# Patient Record
Sex: Female | Born: 1955 | Race: Black or African American | Hispanic: No | Marital: Single | Smoking: Current some day smoker
Health system: Southern US, Community
[De-identification: ages and names within clinical notes are randomized; demographics above are authoritative.]

## PROBLEM LIST (undated history)

## (undated) DIAGNOSIS — F1410 Cocaine abuse, uncomplicated: Secondary | ICD-10-CM

## (undated) DIAGNOSIS — F419 Anxiety disorder, unspecified: Secondary | ICD-10-CM

## (undated) DIAGNOSIS — K222 Esophageal obstruction: Secondary | ICD-10-CM

## (undated) DIAGNOSIS — C7931 Secondary malignant neoplasm of brain: Secondary | ICD-10-CM

## (undated) DIAGNOSIS — F329 Major depressive disorder, single episode, unspecified: Secondary | ICD-10-CM

## (undated) DIAGNOSIS — C3490 Malignant neoplasm of unspecified part of unspecified bronchus or lung: Secondary | ICD-10-CM

## (undated) DIAGNOSIS — E782 Mixed hyperlipidemia: Secondary | ICD-10-CM

## (undated) DIAGNOSIS — Z87440 Personal history of urinary (tract) infections: Secondary | ICD-10-CM

## (undated) DIAGNOSIS — E119 Type 2 diabetes mellitus without complications: Secondary | ICD-10-CM

## (undated) DIAGNOSIS — J910 Malignant pleural effusion: Secondary | ICD-10-CM

## (undated) DIAGNOSIS — N320 Bladder-neck obstruction: Secondary | ICD-10-CM

## (undated) DIAGNOSIS — K219 Gastro-esophageal reflux disease without esophagitis: Secondary | ICD-10-CM

## (undated) DIAGNOSIS — J189 Pneumonia, unspecified organism: Secondary | ICD-10-CM

## (undated) DIAGNOSIS — Z86718 Personal history of other venous thrombosis and embolism: Secondary | ICD-10-CM

## (undated) DIAGNOSIS — R7303 Prediabetes: Secondary | ICD-10-CM

## (undated) DIAGNOSIS — F32A Depression, unspecified: Secondary | ICD-10-CM

## (undated) DIAGNOSIS — E46 Unspecified protein-calorie malnutrition: Secondary | ICD-10-CM

## (undated) DIAGNOSIS — I480 Paroxysmal atrial fibrillation: Secondary | ICD-10-CM

## (undated) DIAGNOSIS — Z923 Personal history of irradiation: Secondary | ICD-10-CM

## (undated) HISTORY — DX: Depression, unspecified: F32.A

## (undated) HISTORY — PX: HEMORRHOID SURGERY: SHX153

## (undated) HISTORY — DX: Major depressive disorder, single episode, unspecified: F32.9

## (undated) HISTORY — DX: Personal history of urinary (tract) infections: Z87.440

## (undated) HISTORY — DX: Anxiety disorder, unspecified: F41.9

## (undated) HISTORY — DX: Cocaine abuse, uncomplicated: F14.10

## (undated) HISTORY — PX: ABDOMINAL HYSTERECTOMY: SHX81

---

## 1998-02-11 ENCOUNTER — Ambulatory Visit (HOSPITAL_COMMUNITY): Admission: RE | Admit: 1998-02-11 | Discharge: 1998-02-11 | Payer: Self-pay | Admitting: Obstetrics

## 1998-08-12 ENCOUNTER — Other Ambulatory Visit: Admission: RE | Admit: 1998-08-12 | Discharge: 1998-08-12 | Payer: Self-pay | Admitting: Obstetrics

## 1999-09-21 ENCOUNTER — Other Ambulatory Visit: Admission: RE | Admit: 1999-09-21 | Discharge: 1999-09-21 | Payer: Self-pay | Admitting: Obstetrics

## 1999-10-27 ENCOUNTER — Encounter (HOSPITAL_BASED_OUTPATIENT_CLINIC_OR_DEPARTMENT_OTHER): Payer: Self-pay | Admitting: General Surgery

## 1999-10-29 ENCOUNTER — Ambulatory Visit (HOSPITAL_COMMUNITY): Admission: RE | Admit: 1999-10-29 | Discharge: 1999-10-29 | Payer: Self-pay | Admitting: General Surgery

## 1999-10-29 ENCOUNTER — Encounter (INDEPENDENT_AMBULATORY_CARE_PROVIDER_SITE_OTHER): Payer: Self-pay | Admitting: Specialist

## 2000-10-20 ENCOUNTER — Other Ambulatory Visit: Admission: RE | Admit: 2000-10-20 | Discharge: 2000-10-20 | Payer: Self-pay | Admitting: Obstetrics

## 2002-03-08 ENCOUNTER — Ambulatory Visit (HOSPITAL_COMMUNITY): Admission: RE | Admit: 2002-03-08 | Discharge: 2002-03-08 | Payer: Self-pay | Admitting: Internal Medicine

## 2003-03-13 ENCOUNTER — Ambulatory Visit (HOSPITAL_COMMUNITY): Admission: RE | Admit: 2003-03-13 | Discharge: 2003-03-13 | Payer: Self-pay | Admitting: Family Medicine

## 2004-03-04 ENCOUNTER — Ambulatory Visit (HOSPITAL_COMMUNITY): Admission: RE | Admit: 2004-03-04 | Discharge: 2004-03-04 | Payer: Self-pay | Admitting: Internal Medicine

## 2004-08-10 ENCOUNTER — Ambulatory Visit: Payer: Self-pay | Admitting: Nurse Practitioner

## 2004-08-26 ENCOUNTER — Ambulatory Visit: Payer: Self-pay | Admitting: *Deleted

## 2004-10-12 ENCOUNTER — Ambulatory Visit: Payer: Self-pay | Admitting: Nurse Practitioner

## 2005-03-04 ENCOUNTER — Ambulatory Visit: Payer: Self-pay | Admitting: Nurse Practitioner

## 2005-03-22 ENCOUNTER — Ambulatory Visit (HOSPITAL_COMMUNITY): Admission: RE | Admit: 2005-03-22 | Discharge: 2005-03-22 | Payer: Self-pay | Admitting: Family Medicine

## 2005-03-31 ENCOUNTER — Encounter: Admission: RE | Admit: 2005-03-31 | Discharge: 2005-03-31 | Payer: Self-pay | Admitting: Family Medicine

## 2005-09-30 ENCOUNTER — Encounter: Admission: RE | Admit: 2005-09-30 | Discharge: 2005-09-30 | Payer: Self-pay | Admitting: Internal Medicine

## 2005-12-14 ENCOUNTER — Ambulatory Visit: Payer: Self-pay | Admitting: Nurse Practitioner

## 2005-12-23 ENCOUNTER — Ambulatory Visit (HOSPITAL_COMMUNITY): Admission: RE | Admit: 2005-12-23 | Discharge: 2005-12-23 | Payer: Self-pay | Admitting: Nurse Practitioner

## 2005-12-28 ENCOUNTER — Ambulatory Visit: Payer: Self-pay | Admitting: Nurse Practitioner

## 2006-05-04 ENCOUNTER — Ambulatory Visit: Payer: Self-pay | Admitting: Nurse Practitioner

## 2006-05-12 ENCOUNTER — Encounter: Admission: RE | Admit: 2006-05-12 | Discharge: 2006-05-12 | Payer: Self-pay | Admitting: Nurse Practitioner

## 2006-06-13 ENCOUNTER — Ambulatory Visit: Payer: Self-pay | Admitting: Nurse Practitioner

## 2006-06-15 ENCOUNTER — Ambulatory Visit: Payer: Self-pay | Admitting: Nurse Practitioner

## 2006-07-22 ENCOUNTER — Ambulatory Visit: Payer: Self-pay | Admitting: Family Medicine

## 2007-03-02 ENCOUNTER — Ambulatory Visit: Payer: Self-pay | Admitting: Nurse Practitioner

## 2007-03-21 ENCOUNTER — Ambulatory Visit: Payer: Self-pay | Admitting: Internal Medicine

## 2007-04-04 ENCOUNTER — Ambulatory Visit: Payer: Self-pay | Admitting: Internal Medicine

## 2007-06-03 ENCOUNTER — Emergency Department (HOSPITAL_COMMUNITY): Admission: EM | Admit: 2007-06-03 | Discharge: 2007-06-03 | Payer: Self-pay | Admitting: Emergency Medicine

## 2007-06-19 ENCOUNTER — Ambulatory Visit: Payer: Self-pay | Admitting: Internal Medicine

## 2007-06-25 ENCOUNTER — Emergency Department (HOSPITAL_COMMUNITY): Admission: EM | Admit: 2007-06-25 | Discharge: 2007-06-25 | Payer: Self-pay | Admitting: Family Medicine

## 2007-07-26 ENCOUNTER — Encounter: Admission: RE | Admit: 2007-07-26 | Discharge: 2007-07-26 | Payer: Self-pay | Admitting: Family Medicine

## 2007-08-18 ENCOUNTER — Ambulatory Visit: Payer: Self-pay | Admitting: Internal Medicine

## 2007-11-09 ENCOUNTER — Ambulatory Visit: Payer: Self-pay | Admitting: Family Medicine

## 2007-11-09 ENCOUNTER — Encounter (INDEPENDENT_AMBULATORY_CARE_PROVIDER_SITE_OTHER): Payer: Self-pay | Admitting: Nurse Practitioner

## 2007-11-11 ENCOUNTER — Emergency Department (HOSPITAL_COMMUNITY): Admission: EM | Admit: 2007-11-11 | Discharge: 2007-11-11 | Payer: Self-pay | Admitting: Emergency Medicine

## 2008-03-05 ENCOUNTER — Ambulatory Visit: Payer: Self-pay | Admitting: Internal Medicine

## 2008-04-03 ENCOUNTER — Encounter: Admission: RE | Admit: 2008-04-03 | Discharge: 2008-04-03 | Payer: Self-pay | Admitting: Orthopedic Surgery

## 2008-05-05 ENCOUNTER — Emergency Department (HOSPITAL_COMMUNITY): Admission: EM | Admit: 2008-05-05 | Discharge: 2008-05-05 | Payer: Self-pay | Admitting: Family Medicine

## 2008-06-19 ENCOUNTER — Encounter: Payer: Self-pay | Admitting: Internal Medicine

## 2008-06-19 ENCOUNTER — Encounter (INDEPENDENT_AMBULATORY_CARE_PROVIDER_SITE_OTHER): Payer: Self-pay | Admitting: Internal Medicine

## 2008-06-19 ENCOUNTER — Ambulatory Visit: Payer: Self-pay | Admitting: Family Medicine

## 2008-06-19 LAB — CONVERTED CEMR LAB
BUN: 13 mg/dL (ref 6–23)
Basophils Absolute: 0 10*3/uL (ref 0.0–0.1)
Basophils Relative: 1 % (ref 0–1)
CO2: 23 meq/L (ref 19–32)
Calcium: 9.3 mg/dL (ref 8.4–10.5)
Chloride: 110 meq/L (ref 96–112)
Cholesterol: 161 mg/dL (ref 0–200)
Creatinine, Ser: 0.74 mg/dL (ref 0.40–1.20)
Eosinophils Absolute: 0.2 10*3/uL (ref 0.0–0.7)
Eosinophils Relative: 2 % (ref 0–5)
Glucose, Bld: 68 mg/dL — ABNORMAL LOW (ref 70–99)
HCT: 44.2 % (ref 36.0–46.0)
HDL: 64 mg/dL (ref >39)
Hemoglobin: 14.2 g/dL (ref 12.0–15.0)
LDL Cholesterol: 78 mg/dL (ref 0–99)
Lymphocytes Relative: 41 % (ref 12–46)
Lymphs Abs: 3.4 10*3/uL (ref 0.7–4.0)
MCHC: 32.1 g/dL (ref 30.0–36.0)
MCV: 94.6 fL (ref 78.0–100.0)
Monocytes Absolute: 0.5 10*3/uL (ref 0.1–1.0)
Monocytes Relative: 6 % (ref 3–12)
Neutro Abs: 4.1 10*3/uL (ref 1.7–7.7)
Neutrophils Relative %: 50 % (ref 43–77)
Platelets: 235 10*3/uL (ref 150–400)
Potassium: 4.2 meq/L (ref 3.5–5.3)
RBC: 4.67 M/uL (ref 3.87–5.11)
RDW: 14.7 % (ref 11.5–15.5)
Sodium: 144 meq/L (ref 135–145)
TSH: 0.464 microintl units/mL (ref 0.350–4.50)
Total CHOL/HDL Ratio: 2.5
Triglycerides: 97 mg/dL (ref <150)
VLDL: 19 mg/dL (ref 0–40)
WBC: 8.2 10*3/uL (ref 4.0–10.5)

## 2008-06-21 ENCOUNTER — Ambulatory Visit: Payer: Self-pay | Admitting: Internal Medicine

## 2008-07-26 ENCOUNTER — Encounter: Admission: RE | Admit: 2008-07-26 | Discharge: 2008-07-26 | Payer: Self-pay | Admitting: Internal Medicine

## 2008-08-12 ENCOUNTER — Ambulatory Visit: Payer: Self-pay | Admitting: Internal Medicine

## 2008-10-11 ENCOUNTER — Emergency Department (HOSPITAL_COMMUNITY): Admission: EM | Admit: 2008-10-11 | Discharge: 2008-10-11 | Payer: Self-pay | Admitting: Family Medicine

## 2008-11-15 ENCOUNTER — Emergency Department (HOSPITAL_COMMUNITY): Admission: EM | Admit: 2008-11-15 | Discharge: 2008-11-15 | Payer: Self-pay | Admitting: Family Medicine

## 2008-12-05 ENCOUNTER — Ambulatory Visit: Payer: Self-pay | Admitting: Internal Medicine

## 2008-12-25 ENCOUNTER — Ambulatory Visit: Payer: Self-pay | Admitting: Family Medicine

## 2009-01-14 ENCOUNTER — Emergency Department (HOSPITAL_COMMUNITY): Admission: EM | Admit: 2009-01-14 | Discharge: 2009-01-14 | Payer: Self-pay | Admitting: Family Medicine

## 2009-02-12 ENCOUNTER — Encounter (INDEPENDENT_AMBULATORY_CARE_PROVIDER_SITE_OTHER): Payer: Self-pay | Admitting: Internal Medicine

## 2009-02-12 ENCOUNTER — Ambulatory Visit: Payer: Self-pay | Admitting: Family Medicine

## 2009-02-12 LAB — CONVERTED CEMR LAB

## 2009-02-18 ENCOUNTER — Ambulatory Visit: Payer: Self-pay | Admitting: Internal Medicine

## 2009-04-03 ENCOUNTER — Ambulatory Visit: Payer: Self-pay | Admitting: Internal Medicine

## 2009-04-14 ENCOUNTER — Ambulatory Visit: Payer: Self-pay | Admitting: Internal Medicine

## 2009-04-24 ENCOUNTER — Ambulatory Visit: Payer: Self-pay | Admitting: Family Medicine

## 2009-05-29 ENCOUNTER — Ambulatory Visit: Payer: Self-pay | Admitting: Internal Medicine

## 2009-05-29 LAB — CONVERTED CEMR LAB
ALT: 11 units/L (ref 0–35)
AST: 19 units/L (ref 0–37)
Albumin: 4.1 g/dL (ref 3.5–5.2)
Alkaline Phosphatase: 104 units/L (ref 39–117)
BUN: 17 mg/dL (ref 6–23)
Basophils Absolute: 0 10*3/uL (ref 0.0–0.1)
Basophils Relative: 1 % (ref 0–1)
CO2: 23 meq/L (ref 19–32)
Calcium: 9.1 mg/dL (ref 8.4–10.5)
Chloride: 108 meq/L (ref 96–112)
Cholesterol: 171 mg/dL (ref 0–200)
Creatinine, Ser: 0.86 mg/dL (ref 0.40–1.20)
Eosinophils Absolute: 0.2 10*3/uL (ref 0.0–0.7)
Eosinophils Relative: 2 % (ref 0–5)
Glucose, Bld: 79 mg/dL (ref 70–99)
HCT: 42.4 % (ref 36.0–46.0)
HDL: 70 mg/dL (ref >39)
Hemoglobin: 14.7 g/dL (ref 12.0–15.0)
LDL Cholesterol: 92 mg/dL (ref 0–99)
Lymphocytes Relative: 39 % (ref 12–46)
Lymphs Abs: 3 10*3/uL (ref 0.7–4.0)
MCHC: 34.7 g/dL (ref 30.0–36.0)
MCV: 91.6 fL (ref 78.0–100.0)
Monocytes Absolute: 0.4 10*3/uL (ref 0.1–1.0)
Monocytes Relative: 6 % (ref 3–12)
Neutro Abs: 4 10*3/uL (ref 1.7–7.7)
Neutrophils Relative %: 52 % (ref 43–77)
Platelets: 214 10*3/uL (ref 150–400)
Potassium: 4.3 meq/L (ref 3.5–5.3)
RBC: 4.63 M/uL (ref 3.87–5.11)
RDW: 14.8 % (ref 11.5–15.5)
Sodium: 141 meq/L (ref 135–145)
Total Bilirubin: 0.5 mg/dL (ref 0.3–1.2)
Total CHOL/HDL Ratio: 2.4
Total Protein: 6.7 g/dL (ref 6.0–8.3)
Triglycerides: 45 mg/dL (ref <150)
VLDL: 9 mg/dL (ref 0–40)
WBC: 7.7 10*3/uL (ref 4.0–10.5)

## 2009-07-01 ENCOUNTER — Ambulatory Visit: Payer: Self-pay | Admitting: Internal Medicine

## 2009-09-10 ENCOUNTER — Emergency Department (HOSPITAL_COMMUNITY): Admission: EM | Admit: 2009-09-10 | Discharge: 2009-09-10 | Payer: Self-pay | Admitting: Family Medicine

## 2009-09-17 ENCOUNTER — Ambulatory Visit: Payer: Self-pay | Admitting: Internal Medicine

## 2009-10-27 ENCOUNTER — Ambulatory Visit: Payer: Self-pay | Admitting: Family Medicine

## 2010-02-16 ENCOUNTER — Encounter (INDEPENDENT_AMBULATORY_CARE_PROVIDER_SITE_OTHER): Payer: Self-pay | Admitting: Internal Medicine

## 2010-02-16 ENCOUNTER — Ambulatory Visit: Payer: Self-pay | Admitting: Family Medicine

## 2010-02-16 ENCOUNTER — Encounter: Admission: RE | Admit: 2010-02-16 | Discharge: 2010-02-16 | Payer: Self-pay | Admitting: Internal Medicine

## 2010-02-16 LAB — CONVERTED CEMR LAB: TSH: 0.885 microintl units/mL (ref 0.350–4.500)

## 2010-02-24 ENCOUNTER — Ambulatory Visit: Payer: Self-pay | Admitting: Internal Medicine

## 2010-05-26 ENCOUNTER — Ambulatory Visit: Payer: Self-pay | Admitting: Internal Medicine

## 2010-06-14 ENCOUNTER — Emergency Department (HOSPITAL_COMMUNITY): Admission: EM | Admit: 2010-06-14 | Discharge: 2010-06-14 | Payer: Self-pay | Admitting: Emergency Medicine

## 2010-07-08 ENCOUNTER — Emergency Department (HOSPITAL_COMMUNITY): Admission: EM | Admit: 2010-07-08 | Discharge: 2010-07-08 | Payer: Self-pay | Admitting: Family Medicine

## 2010-08-14 ENCOUNTER — Emergency Department (HOSPITAL_COMMUNITY)
Admission: EM | Admit: 2010-08-14 | Discharge: 2010-08-14 | Payer: Self-pay | Source: Home / Self Care | Admitting: Family Medicine

## 2010-10-18 ENCOUNTER — Emergency Department (HOSPITAL_COMMUNITY)
Admission: EM | Admit: 2010-10-18 | Discharge: 2010-10-18 | Payer: Self-pay | Source: Home / Self Care | Admitting: Family Medicine

## 2010-10-19 LAB — POCT URINALYSIS DIPSTICK
Bilirubin Urine: NEGATIVE
Ketones, ur: NEGATIVE mg/dL
Nitrite: POSITIVE — AB
Protein, ur: 100 mg/dL — AB
Specific Gravity, Urine: 1.025 (ref 1.005–1.030)
Urine Glucose, Fasting: NEGATIVE mg/dL
Urobilinogen, UA: 0.2 mg/dL (ref 0.0–1.0)
pH: 5.5 (ref 5.0–8.0)

## 2010-10-25 ENCOUNTER — Encounter: Payer: Self-pay | Admitting: Internal Medicine

## 2010-11-16 ENCOUNTER — Encounter (INDEPENDENT_AMBULATORY_CARE_PROVIDER_SITE_OTHER): Payer: Self-pay | Admitting: *Deleted

## 2010-11-16 LAB — CONVERTED CEMR LAB
Basophils Absolute: 0 10*3/uL (ref 0.0–0.1)
Basophils Relative: 1 % (ref 0–1)
Eosinophils Absolute: 0.2 10*3/uL (ref 0.0–0.7)
Eosinophils Relative: 3 % (ref 0–5)
HCT: 41.5 % (ref 36.0–46.0)
Hemoglobin: 13.7 g/dL (ref 12.0–15.0)
Lymphocytes Relative: 37 % (ref 12–46)
Lymphs Abs: 2.9 10*3/uL (ref 0.7–4.0)
MCHC: 33 g/dL (ref 30.0–36.0)
MCV: 92.2 fL (ref 78.0–100.0)
Monocytes Absolute: 0.5 10*3/uL (ref 0.1–1.0)
Monocytes Relative: 7 % (ref 3–12)
Neutro Abs: 4.1 10*3/uL (ref 1.7–7.7)
Neutrophils Relative %: 53 % (ref 43–77)
Platelets: 245 10*3/uL (ref 150–400)
RBC: 4.5 M/uL (ref 3.87–5.11)
RDW: 14.4 % (ref 11.5–15.5)
TSH: 0.673 microintl units/mL (ref 0.350–4.500)
WBC: 7.8 10*3/uL (ref 4.0–10.5)

## 2010-11-17 ENCOUNTER — Encounter (INDEPENDENT_AMBULATORY_CARE_PROVIDER_SITE_OTHER): Payer: Self-pay | Admitting: *Deleted

## 2010-12-17 LAB — POCT URINALYSIS DIPSTICK
Bilirubin Urine: NEGATIVE
Glucose, UA: NEGATIVE mg/dL
Ketones, ur: NEGATIVE mg/dL
Nitrite: POSITIVE — AB
Protein, ur: NEGATIVE mg/dL
Specific Gravity, Urine: 1.02 (ref 1.005–1.030)
Urobilinogen, UA: 0.2 mg/dL (ref 0.0–1.0)
pH: 6 (ref 5.0–8.0)

## 2010-12-17 LAB — URINE CULTURE
Colony Count: >=100000
Culture  Setup Time: 201110051501

## 2010-12-29 ENCOUNTER — Encounter (INDEPENDENT_AMBULATORY_CARE_PROVIDER_SITE_OTHER): Payer: Self-pay | Admitting: *Deleted

## 2011-01-05 LAB — POCT URINALYSIS DIP (DEVICE)
Bilirubin Urine: NEGATIVE
Glucose, UA: NEGATIVE mg/dL
Ketones, ur: NEGATIVE mg/dL
Nitrite: NEGATIVE
Protein, ur: 100 mg/dL — AB
Specific Gravity, Urine: 1.01 (ref 1.005–1.030)
Urobilinogen, UA: 0.2 mg/dL (ref 0.0–1.0)
pH: 5.5 (ref 5.0–8.0)

## 2011-01-18 LAB — POCT I-STAT, CHEM 8
BUN: 20 mg/dL (ref 6–23)
Calcium, Ion: 1.22 mmol/L (ref 1.12–1.32)
Chloride: 106 mEq/L (ref 96–112)
Creatinine, Ser: 1 mg/dL (ref 0.4–1.2)
Glucose, Bld: 79 mg/dL (ref 70–99)
HCT: 45 % (ref 36.0–46.0)
Hemoglobin: 15.3 g/dL — ABNORMAL HIGH (ref 12.0–15.0)
Potassium: 4.2 mEq/L (ref 3.5–5.1)
Sodium: 142 mEq/L (ref 135–145)
TCO2: 26 mmol/L (ref 0–100)

## 2011-02-19 NOTE — Op Note (Signed)
[facility]  Patient:    Kim Ross                          MRN: [id number] Proc. Date: 10/29/99 Adm. Date:  [id number] Disc. Date: [id number] Attending:  Sonda Primes                           Operative Report  PREOPERATIVE DIAGNOSIS:  Left-sided ______ breast mass.  POSTOPERATIVE DIAGNOSIS:  Left-sided ______ breast mass.  PATHOLOGY:  Pending.  PROCEDURE:  Excision and biopsy of left ______ mass with associated duct excision.  SURGEON:  Mardene Celeste. Lurene Shadow, M.D.  ASSISTANT:  Damita Lack, P.A.-student  ANESTHESIA:  General.  INDICATIONS:  This patient is a 55 year old woman with an enlarging left ______  mass which I think is probably a plugged duct.  She is not having any nipple discharge at this time.  She is brought to the operating room for excision of the mass.  PROCEDURE:  Following the induction of anesthesia, the patient is positioned supinely, and the left breast was prepped and draped to be included in a sterile operative field.  I made a transareolar incision extending from the areolar border up to the middle of the nipple.  This was made in an elliptical fashion, deep into the skin and subcutaneous tissues involving the entire mass and the surrounding  segment of intraductal tissue.  This mass was removed in its entirety and forwarded for pathologic evaluation.  Hemostasis was assured with electrocautery. Sponge, instrument, and sharp counts were verified.  Subcutaneous tissue were reapproximated with 3-0 Vicryl sutures, thus reconstructing the nipple, and the  skin was closed with a running 5-0 Monocryl suture.  The wound was reinforced with Steri-Strips, and sterile dressings were applied.  Anesthetic was reversed, and the patient was removed from the operating room to the recovery room in stable condition, having tolerated the procedure well. DD:  10/29/99 TD:  10/29/99 Job: [id number]

## 2011-04-09 ENCOUNTER — Other Ambulatory Visit (HOSPITAL_COMMUNITY): Payer: Self-pay | Admitting: Family Medicine

## 2011-04-09 DIAGNOSIS — Z1231 Encounter for screening mammogram for malignant neoplasm of breast: Secondary | ICD-10-CM

## 2011-04-28 ENCOUNTER — Ambulatory Visit (HOSPITAL_COMMUNITY)
Admission: RE | Admit: 2011-04-28 | Discharge: 2011-04-28 | Disposition: A | Discharge status: Home / Self Care | Payer: Self-pay | Source: Ambulatory Visit | Attending: Family Medicine | Admitting: Family Medicine

## 2011-04-28 DIAGNOSIS — Z1231 Encounter for screening mammogram for malignant neoplasm of breast: Secondary | ICD-10-CM | POA: Insufficient documentation

## 2012-05-02 ENCOUNTER — Encounter (HOSPITAL_COMMUNITY): Payer: Self-pay | Admitting: Neurology

## 2012-05-02 ENCOUNTER — Emergency Department (HOSPITAL_COMMUNITY)
Admission: EM | Admit: 2012-05-02 | Discharge: 2012-05-02 | Disposition: A | Discharge status: Home / Self Care | Payer: Self-pay | Attending: Emergency Medicine | Admitting: Emergency Medicine

## 2012-05-02 DIAGNOSIS — B353 Tinea pedis: Secondary | ICD-10-CM | POA: Insufficient documentation

## 2012-05-02 MED ORDER — CLOTRIMAZOLE 1 % EX CREA: TOPICAL_CREAM | CUTANEOUS | Status: DC

## 2012-05-02 NOTE — ED Provider Notes (Addendum)
History     CSN: [id number]  Arrival date & time 05/02/12  4098   First MD Initiated Contact with Patient 05/02/12 [phone]      Chief Complaint  Patient presents with  . Rash    (Consider location/radiation/quality/duration/timing/severity/associated sxs/prior treatment) Patient is a 56 y.o. female presenting with rash. The history is provided by the patient.  Rash  This is a new problem. Episode onset: One week ago. The problem has been gradually worsening. The problem is associated with an unknown factor. There has been no fever. The rash is present on the left foot and right foot. The pain is at a severity of 0/10. The patient is experiencing no pain. The pain has been constant since onset. Associated symptoms comments: Started with multiple pustules that resolved. She has tried nothing for the symptoms. The treatment provided no relief.    History reviewed. No pertinent past medical history.  History reviewed. No pertinent past surgical history.  No family history on file.  History  Substance Use Topics  . Smoking status: Current Everyday Smoker  . Smokeless tobacco: Not on file  . Alcohol Use: No    OB History    Grav Para Term Preterm Abortions TAB SAB Ect Mult Living                  Review of Systems  Skin: Positive for rash.  All other systems reviewed and are negative.    Allergies  Review of patient's allergies indicates not on file.  Home Medications  No current outpatient prescriptions on file.  There were no vitals taken for this visit.  Physical Exam  Nursing note and vitals reviewed. Constitutional: She appears well-developed and well-nourished. No distress.  HENT:  Head: Normocephalic and atraumatic.  Eyes: Pupils are equal, round, and reactive to light.  Skin: Skin is warm and dry. Rash noted. No bruising and no ecchymosis noted. Rash is maculopapular. No erythema.       Dry macular papular rash in the arch of bilateral feet. No pustules,  vesicles or weeping. No erythema or signs of abscess    ED Course  Procedures (including critical care time)  Labs Reviewed - No data to display No results found.   1. Tinea pedis       MDM   Patient with history of one week of rash to the bottom of bilateral feet. Minimal itching and started with pustules. Most likely fungal in origin and will try clotrimazole.        Gwyneth Sprout, MD 05/02/12 4782  Gwyneth Sprout, MD 05/02/12 1000

## 2012-05-02 NOTE — ED Notes (Signed)
Pt reporting rash to side of bilateral feet and arches. No itching. Rash x 1 week. No pain. Ambulates independently. NAD. Ax 4

## 2012-05-11 ENCOUNTER — Other Ambulatory Visit (HOSPITAL_COMMUNITY): Payer: Self-pay | Admitting: Family Medicine

## 2012-05-11 DIAGNOSIS — Z1231 Encounter for screening mammogram for malignant neoplasm of breast: Secondary | ICD-10-CM

## 2012-05-30 ENCOUNTER — Ambulatory Visit (HOSPITAL_COMMUNITY): Payer: Self-pay

## 2012-06-13 ENCOUNTER — Ambulatory Visit (HOSPITAL_COMMUNITY)
Admission: RE | Admit: 2012-06-13 | Discharge: 2012-06-13 | Disposition: A | Discharge status: Home / Self Care | Payer: Self-pay | Source: Ambulatory Visit | Attending: Family Medicine | Admitting: Family Medicine

## 2012-06-13 DIAGNOSIS — Z1231 Encounter for screening mammogram for malignant neoplasm of breast: Secondary | ICD-10-CM | POA: Insufficient documentation

## 2012-07-20 ENCOUNTER — Encounter: Payer: Self-pay | Admitting: Family Medicine

## 2012-07-20 ENCOUNTER — Other Ambulatory Visit: Payer: Self-pay | Admitting: Family Medicine

## 2012-07-20 ENCOUNTER — Ambulatory Visit (INDEPENDENT_AMBULATORY_CARE_PROVIDER_SITE_OTHER): Payer: Self-pay | Admitting: Family Medicine

## 2012-07-20 VITALS — BP 126/79 | HR 83 | Temp 98.0°F | Ht 64.0 in | Wt 114.0 lb

## 2012-07-20 DIAGNOSIS — F3289 Other specified depressive episodes: Secondary | ICD-10-CM

## 2012-07-20 DIAGNOSIS — F329 Major depressive disorder, single episode, unspecified: Secondary | ICD-10-CM

## 2012-07-20 DIAGNOSIS — Z720 Tobacco use: Secondary | ICD-10-CM

## 2012-07-20 DIAGNOSIS — F32A Depression, unspecified: Secondary | ICD-10-CM | POA: Insufficient documentation

## 2012-07-20 DIAGNOSIS — F17200 Nicotine dependence, unspecified, uncomplicated: Secondary | ICD-10-CM

## 2012-07-20 DIAGNOSIS — Z87440 Personal history of urinary (tract) infections: Secondary | ICD-10-CM | POA: Insufficient documentation

## 2012-07-20 MED ORDER — BUPROPION HCL 75 MG PO TABS: 75.0000 mg | ORAL_TABLET | Freq: Two times a day (BID) | ORAL | Status: DC

## 2012-07-20 NOTE — Assessment & Plan Note (Signed)
Situational.  Also patient wanting to quit smoking, will try Wellbutrin.

## 2012-07-20 NOTE — Assessment & Plan Note (Signed)
Will continue nitrofurantoin for now.  No clinical changes, will check urine if symptoms worsen.

## 2012-07-20 NOTE — Assessment & Plan Note (Signed)
Discussed cessation techniques.  Will try Wellbutrin, nicotine patches.

## 2012-07-20 NOTE — Patient Instructions (Signed)
It was nice to meet you.  Please stop by the front desk and let them know you need to apply for the [facility] card.   I am glad to hear you want to quit smoking, the medication Wellbutrin can help with quitting as well as anxiety and depression.  You can also try nicotine patches, start with the 14 mg patches and then wean down.   Please come back and see me in January or so for a check up, or call the office sooner if you are having any issues.

## 2012-07-20 NOTE — Progress Notes (Signed)
  Subjective:    Patient ID: Kim Ross, female    DOB: 1956/09/21, 56 y.o.   MRN: [id number]  HPI  [name] comes in today to establish care.    She says that she has been feeling very stressed and depressed lately.  Her Fiance is in jail right now and she is caring for his children alone.  She denies and SI/HI, but does say she has depressed mood and difficulty sleeping and difficulty concentrating.  She has Trazadone for sleep but is not taking a depression medication.   Tobacco- patient has been smoking about 1/2 ppd x 30 years.  She wants to quit but has failed many times.  She has heard Chantix has a lot of side effects and wonders about any other pills that might help her quit.  She also wants to know if nicotine patches are safe.   Recurrent UTI's- was started on nitrofurantoin 50 mg po qhs about 8-9 months ago for prophylaxis.  She thinks this has helped some but is worried about a UTI because she has had one before with minimal symptoms.  She says she always has frequency, but denies any pain with urination, fevers, chills, abdominal pain or back pain.    Past Medical History  Diagnosis Date  . History of recurrent UTIs   . Anxiety   . Depression    Family History  Problem Relation Age of Onset  . Asthma Mother   . Cancer Sister 27    Pancreatic   History  Substance Use Topics  . Smoking status: Current Every Day Smoker -- 0.5 packs/day    Types: Cigarettes  . Smokeless tobacco: Never Used  . Alcohol Use: 0.6 oz/week    1 Cans of beer per week    Review of Systems    See HPI Objective:   Physical Exam  BP 126/79  Pulse 83  Temp 98 F (36.7 C) (Oral)  Ht 5\' 4"  (1.626 m)  Wt 114 lb (51.71 kg)  BMI 19.57 kg/m2 General appearance: alert, cooperative and no distress Neck: supple, symmetrical, trachea midline and thyroid not enlarged, symmetric, no tenderness/mass/nodules Lungs: clear to auscultation bilaterally Heart: regular rate and rhythm, S1, S2 normal, no  murmur, click, rub or gallop Extremities: extremities normal, atraumatic, no cyanosis or edema      Assessment & Plan:

## 2012-09-22 ENCOUNTER — Ambulatory Visit (INDEPENDENT_AMBULATORY_CARE_PROVIDER_SITE_OTHER): Payer: Self-pay | Admitting: Family Medicine

## 2012-09-22 VITALS — BP 133/78 | HR 84 | Temp 98.1°F | Ht 64.0 in | Wt 115.0 lb

## 2012-09-22 DIAGNOSIS — Z87440 Personal history of urinary (tract) infections: Secondary | ICD-10-CM

## 2012-09-22 DIAGNOSIS — K625 Hemorrhage of anus and rectum: Secondary | ICD-10-CM

## 2012-09-22 DIAGNOSIS — A599 Trichomoniasis, unspecified: Secondary | ICD-10-CM | POA: Insufficient documentation

## 2012-09-22 LAB — POCT URINALYSIS DIPSTICK
Glucose, UA: NEGATIVE
Nitrite, UA: NEGATIVE
Urobilinogen, UA: 1
pH, UA: 7

## 2012-09-22 MED ORDER — METRONIDAZOLE 500 MG PO TABS: 1000.0000 mg | ORAL_TABLET | Freq: Two times a day (BID) | ORAL | Status: DC

## 2012-09-22 NOTE — Patient Instructions (Signed)
Dear Ms. Fischler,   I am glad that you are not having any hemorrhoids. However, you do need to have a colonoscopy to make sure nothing else is going on. We will set this up for you, but is may take a while. You also need to follow up with Dr. Lula Olszewski about your constipation and diarrhea.   I am sorry about all the stress that you are having at home. I will be praying for you.   Have a Merry Christmas,   Dr. Clinton Sawyer

## 2012-09-22 NOTE — Assessment & Plan Note (Signed)
Treat with Flagyl 1g q 12 hours for one day.

## 2012-09-22 NOTE — Assessment & Plan Note (Signed)
Unknown etiology. Likely mucosal irritation from diarrhea. However, given age, she needs a colonoscopy. Place referral.

## 2012-09-22 NOTE — Progress Notes (Signed)
  Subjective:    Patient ID: Kim Ross, female    DOB: [birth year], 56 y.o.   MRN: [id number]  HPI  56 year old F who presents for evaluation for evaluation of bright red blood per rectum. She notices the blood the tissue paper - varies in quantity from a few drops to "a lot." It is not painful. Does not feel any skin tags of hemorroids. This has occurred occasionally over the past month. She also noted intermittent constipation and diarrhea. Had similar symptoms in 1980 when she had hemorrhoids. In 1993, she had a procedure to remove her hemorrhoids. Denies any colon cancer screening. Denies any family history of colon cancer.   Review of Systems Positive - social stress from adopted kids, dysuria Negative - vaginal discharge    Objective:   Physical Exam BP 133/78  Pulse 84  Temp 98.1 F (36.7 C) (Oral)  Ht 5\' 4"  (1.626 m)  Wt 115 lb (52.164 kg)  BMI 19.74 kg/m2 Gen: middle aged AA female, well appearing, NAD, pleasant and conversant Eyes: no scleral pallor CV: RRR, no m/r/g, no JVD or carotid bruits Pulm: normal WOB, CTA-B Abd: soft, NDNT, NABS Rectum: no external hemorrhoids or fissures, no internal hemorrhoids or fissures noted with anoscope    Urinalysis noted to have trichomonads      Assessment & Plan:

## 2012-10-19 ENCOUNTER — Ambulatory Visit (INDEPENDENT_AMBULATORY_CARE_PROVIDER_SITE_OTHER): Payer: No Typology Code available for payment source | Admitting: Family Medicine

## 2012-10-19 VITALS — BP 143/83 | HR 85 | Temp 98.9°F | Ht 64.0 in | Wt 111.0 lb

## 2012-10-19 DIAGNOSIS — F329 Major depressive disorder, single episode, unspecified: Secondary | ICD-10-CM

## 2012-10-19 DIAGNOSIS — N898 Other specified noninflammatory disorders of vagina: Secondary | ICD-10-CM

## 2012-10-19 DIAGNOSIS — K589 Irritable bowel syndrome without diarrhea: Secondary | ICD-10-CM | POA: Insufficient documentation

## 2012-10-19 DIAGNOSIS — A599 Trichomoniasis, unspecified: Secondary | ICD-10-CM

## 2012-10-19 DIAGNOSIS — F3289 Other specified depressive episodes: Secondary | ICD-10-CM

## 2012-10-19 DIAGNOSIS — F32A Depression, unspecified: Secondary | ICD-10-CM

## 2012-10-19 LAB — POCT WET PREP (WET MOUNT)
Clue Cells Wet Prep Whiff POC: POSITIVE
WBC, Wet Prep HPF POC: >20

## 2012-10-19 MED ORDER — METRONIDAZOLE 500 MG PO TABS: 500.0000 mg | ORAL_TABLET | Freq: Two times a day (BID) | ORAL | Status: DC

## 2012-10-19 MED ORDER — SERTRALINE HCL 50 MG PO TABS: 50.0000 mg | ORAL_TABLET | Freq: Every day | ORAL | Status: DC

## 2012-10-19 MED ORDER — BUTENAFINE HCL 1 % EX CREA: TOPICAL_CREAM | CUTANEOUS | Status: DC

## 2012-10-19 NOTE — Progress Notes (Signed)
  Subjective:    Patient ID: Kim Ross, female    DOB: July 11, 1956, 57 y.o.   MRN: [id number]  HPI  Kim Ross comes in for follow up:   Alternating constipation and diarrhea: This has continued, with diarrhea, but constipation has not been as bad.  No more blood in her stool.  No fevers, chills.  She does have crampy abdominal pain that is relieved by a bowel movement. She drinks a lot of coffee, and does notice that chips and fried foods make it worse.   Depression/anxiety: never started taking Wellbutrin because she was afraid of the side effects.  She says she has been very stressed lately, is having problems with family members. Denies SI/HI.   Trichomonas: took flagyl for trich found on UA, her boyfriend said he was treated too.  She wants to check to make sure it is gone.   Review of Systems Pertinent items in HPI    Objective:   Physical Exam BP 143/83  Pulse 85  Temp 98.9 F (37.2 C) (Oral)  Ht 5\' 4"  (1.626 m)  Wt 111 lb (50.349 kg)  BMI 19.05 kg/m2 General appearance: alert, cooperative and no distress Abdomen: soft, non-tender; bowel sounds normal; no masses,  no organomegaly Pelvic: cervix normal in appearance, external genitalia normal, no adnexal masses or tenderness, no cervical motion tenderness, rectovaginal septum normal, uterus normal size, shape, and consistency and vagina normal without discharge       Assessment & Plan:

## 2012-10-19 NOTE — Assessment & Plan Note (Signed)
Present again today on wet prep.  rx for flagyl.

## 2012-10-19 NOTE — Assessment & Plan Note (Signed)
Feel symptoms are consistent with irritable bowel, spent time with pt discussing diet and lifestyle modifications.  I told her I still recommend colonoscopy, but she has no way to pay for one right now. Gave hand outs on IBS.

## 2012-10-19 NOTE — Patient Instructions (Signed)
It was nice to see you again.  I want you to start taking sertraline (zoloft) one tablet daily.  Remember for this to help with your mood and anxiety, you have to take it every day.  It will take a month for it to start working.   Please see the attached information about irritable bowel syndrome.  I suggest keeping a food diary to help you identify foods and drinks that make your symptoms worse.

## 2012-10-19 NOTE — Assessment & Plan Note (Signed)
Discussed importance of treating depression and anxiety to help with IBS.  Advised that SSRI's are very safe and suicidal ideation very rare with them.  Rx for zoloft, f/u in one month.

## 2012-10-30 ENCOUNTER — Emergency Department (INDEPENDENT_AMBULATORY_CARE_PROVIDER_SITE_OTHER)
Admission: EM | Admit: 2012-10-30 | Discharge: 2012-10-30 | Disposition: A | Discharge status: Home / Self Care | Payer: No Typology Code available for payment source | Source: Home / Self Care

## 2012-10-30 ENCOUNTER — Encounter (HOSPITAL_COMMUNITY): Payer: Self-pay

## 2012-10-30 DIAGNOSIS — N390 Urinary tract infection, site not specified: Secondary | ICD-10-CM

## 2012-10-30 DIAGNOSIS — J40 Bronchitis, not specified as acute or chronic: Secondary | ICD-10-CM

## 2012-10-30 DIAGNOSIS — F17200 Nicotine dependence, unspecified, uncomplicated: Secondary | ICD-10-CM

## 2012-10-30 DIAGNOSIS — J069 Acute upper respiratory infection, unspecified: Secondary | ICD-10-CM

## 2012-10-30 DIAGNOSIS — J9801 Acute bronchospasm: Secondary | ICD-10-CM

## 2012-10-30 DIAGNOSIS — Z720 Tobacco use: Secondary | ICD-10-CM

## 2012-10-30 LAB — POCT URINALYSIS DIP (DEVICE)
Nitrite: NEGATIVE
Protein, ur: 30 mg/dL — AB
Urobilinogen, UA: 1 mg/dL (ref 0.0–1.0)
pH: 6 (ref 5.0–8.0)

## 2012-10-30 MED ORDER — ALBUTEROL SULFATE (5 MG/ML) 0.5% IN NEBU
INHALATION_SOLUTION | RESPIRATORY_TRACT | Status: AC
  Filled 2012-10-30: qty 1

## 2012-10-30 MED ORDER — SULFAMETHOXAZOLE-TRIMETHOPRIM 800-160 MG PO TABS: 1.0000 | ORAL_TABLET | Freq: Two times a day (BID) | ORAL | Status: DC

## 2012-10-30 MED ORDER — ALBUTEROL SULFATE (5 MG/ML) 0.5% IN NEBU
5.0000 mg | INHALATION_SOLUTION | Freq: Once | RESPIRATORY_TRACT | Status: AC
  Administered 2012-10-30: 5 mg via RESPIRATORY_TRACT

## 2012-10-30 MED ORDER — IPRATROPIUM BROMIDE 0.02 % IN SOLN
0.5000 mg | Freq: Once | RESPIRATORY_TRACT | Status: AC
  Administered 2012-10-30: 0.5 mg via RESPIRATORY_TRACT

## 2012-10-30 MED ORDER — GUAIFENESIN-CODEINE 100-10 MG/5ML PO SYRP: ORAL_SOLUTION | ORAL | Status: DC

## 2012-10-30 MED ORDER — METHYLPREDNISOLONE 4 MG PO KIT: PACK | ORAL | Status: DC

## 2012-10-30 MED ORDER — ALBUTEROL SULFATE HFA 108 (90 BASE) MCG/ACT IN AERS: 2.0000 | INHALATION_SPRAY | RESPIRATORY_TRACT | Status: DC | PRN

## 2012-10-30 NOTE — ED Notes (Signed)
Cough, congestion w green secretions x 2 weeks; minimal relief w OTC medications ; used some of her sisters [facility], now having pain w urination

## 2012-10-30 NOTE — ED Provider Notes (Signed)
Medical screening examination/treatment/procedure(s) were performed by non-physician practitioner and as supervising physician I was immediately available for consultation/collaboration.  Leslee Home, M.D.   Reuben Likes, MD 10/30/12 [phone]

## 2012-10-30 NOTE — ED Provider Notes (Signed)
History     CSN: [id number]  Arrival date & time 10/30/12  1003   First MD Initiated Contact with Patient 10/30/12 1023      Chief Complaint  Patient presents with  . Cough    (Consider location/radiation/quality/duration/timing/severity/associated sxs/prior treatment) HPI Comments: 57 year old female with a cough for 2 weeks. She states she has PND with green mucus. Also complaining of bodyaches, headache and malaise. She states that upon urination it tingles and she has a slight increase in frequency of urination.   Past Medical History  Diagnosis Date  . History of recurrent UTIs   . Anxiety   . Depression     Past Surgical History  Procedure Date  . Abdominal hysterectomy   . Hemorrhoid surgery     Family History  Problem Relation Age of Onset  . Asthma Mother   . Cancer Sister 77    Pancreatic    History  Substance Use Topics  . Smoking status: Current Every Day Smoker -- 0.5 packs/day    Types: Cigarettes  . Smokeless tobacco: Never Used  . Alcohol Use: 0.6 oz/week    1 Cans of beer per week    OB History    Grav Para Term Preterm Abortions TAB SAB Ect Mult Living                  Review of Systems  Constitutional: Positive for fatigue. Negative for fever, chills, activity change and appetite change.  HENT: Positive for congestion, rhinorrhea and postnasal drip. Negative for sore throat, facial swelling, neck pain and neck stiffness.   Eyes: Negative.   Respiratory: Positive for cough. Negative for shortness of breath and wheezing.   Cardiovascular: Negative.   Gastrointestinal: Negative.   Genitourinary: Positive for dysuria and frequency. Negative for urgency, hematuria, vaginal bleeding, vaginal discharge and pelvic pain.  Skin: Negative for pallor and rash.  Neurological: Negative.   Psychiatric/Behavioral: Negative.     Allergies  Review of patient's allergies indicates no known allergies.  Home Medications   Current Outpatient Rx    Name  Route  Sig  Dispense  Refill  . ALBUTEROL SULFATE HFA 108 (90 BASE) MCG/ACT IN AERS   Inhalation   Inhale 2 puffs into the lungs every 4 (four) hours as needed for wheezing. Dispense with aerochamber   1 Inhaler   0   . BUTENAFINE HCL 1 % EX CREA      Apply twice daily to affected area.   24 g   0   . CYCLOBENZAPRINE HCL 5 MG PO TABS   Oral   Take 5 mg by mouth at bedtime as needed.         . GUAIFENESIN-CODEINE 100-10 MG/5ML PO SYRP      1 to 2 teaspoons every 4 hours prn cough and congestion   120 mL   0   . MECLIZINE HCL 25 MG PO TABS   Oral   Take 25 mg by mouth 2 (two) times daily as needed.         . METHYLPREDNISOLONE 4 MG PO KIT      As directed   21 tablet   0   . METRONIDAZOLE 500 MG PO TABS   Oral   Take 1 tablet (500 mg total) by mouth 2 (two) times daily. Take for 1 day.   14 tablet   0   . MULTI-VITAMIN/MINERALS PO TABS   Oral   Take 1 tablet by mouth daily.         Marland Kitchen  SERTRALINE HCL 50 MG PO TABS   Oral   Take 1 tablet (50 mg total) by mouth daily.   30 tablet   3   . SULFAMETHOXAZOLE-TRIMETHOPRIM 800-160 MG PO TABS   Oral   Take 1 tablet by mouth 2 (two) times daily. X 7 days   14 tablet   0   . TRIAMCINOLONE ACETONIDE 0.1 % EX CREA   Topical   Apply topically 2 (two) times daily.           BP 128/85  Pulse 93  Temp 99 F (37.2 C) (Oral)  Resp 20  SpO2 99%  Physical Exam  Nursing note and vitals reviewed. Constitutional: She is oriented to person, place, and time. She appears well-developed and well-nourished. No distress.  HENT:       Bilateral TMs are normal Oropharynx with erythema, red streaking and clear PND. No exudate  Eyes: Conjunctivae normal and EOM are normal.  Neck: Normal range of motion. Neck supple.  Cardiovascular: Normal rate, regular rhythm and normal heart sounds.   Pulmonary/Chest: Effort normal. No respiratory distress.       Diabetes coarseness bilaterally with mixed wheezing.   Abdominal: Soft. There is tenderness.  Musculoskeletal: Normal range of motion. She exhibits no edema.  Lymphadenopathy:    She has no cervical adenopathy.  Neurological: She is alert and oriented to person, place, and time.  Skin: Skin is warm and dry. No rash noted.  Psychiatric: She has a normal mood and affect.    ED Course  Procedures (including critical care time)  Labs Reviewed  POCT URINALYSIS DIP (DEVICE) - Abnormal; Notable for the following:    Hgb urine dipstick SMALL (*)     Protein, ur 30 (*)     Leukocytes, UA TRACE (*)  Biochemical Testing Only. Please order routine urinalysis from main lab if confirmatory testing is needed.   All other components within normal limits  URINE CULTURE   No results found.   1. UTI (lower urinary tract infection)   2. Bronchitis   3. Bronchospasm   4. URI (upper respiratory infection)   5. Tobacco abuse       MDM   Results for orders placed during the hospital encounter of 10/30/12  POCT URINALYSIS DIP (DEVICE)      Component Value Range   Glucose, UA NEGATIVE  NEGATIVE mg/dL   Bilirubin Urine NEGATIVE  NEGATIVE   Ketones, ur NEGATIVE  NEGATIVE mg/dL   Specific Gravity, Urine 1.025  1.005 - 1.030   Hgb urine dipstick SMALL (*) NEGATIVE   pH 6.0  5.0 - 8.0   Protein, ur 30 (*) NEGATIVE mg/dL   Urobilinogen, UA 1.0  0.0 - 1.0 mg/dL   Nitrite NEGATIVE  NEGATIVE   Leukocytes, UA TRACE (*) NEGATIVE   Albuterol HFA 2 puffs every 4-6 hours when necessary cough and wheeze Cheratussin 1-2 teaspoons every 4 hours when necessary cough and congestion Medrol Dosepak #20 one Septra DS one twice a day for 7 days. Septra is indicated for mild to moderate bronchitis and UTIs. The patient his morning is few medicines as possible and as inexpensive as possible. Return for any new problems or worsening. Stop smoking Call your Dr. for appointment this week. The patient was administered a duo neb in the office. After the duo neb she  said she felt better and breathing better. Auscultation reveals significant decrease in wheezing, coarseness and other adventitious sounds. There is rhonchi remaining primarily in the left lung.  Air movement has much improved.         Hayden Rasmussen, NP 10/30/12 1150

## 2012-11-01 LAB — URINE CULTURE

## 2012-11-13 NOTE — ED Notes (Signed)
Urine culture: 50,000 colonies Citrobacter Freundii.  Pt. adequately treaetd with Septra DS. Kim Ross 11/13/2012

## 2013-01-05 ENCOUNTER — Encounter: Payer: Self-pay | Admitting: Family Medicine

## 2013-01-05 ENCOUNTER — Ambulatory Visit (INDEPENDENT_AMBULATORY_CARE_PROVIDER_SITE_OTHER): Payer: Self-pay | Admitting: Family Medicine

## 2013-01-05 VITALS — BP 128/75 | HR 89 | Ht 64.0 in | Wt 111.7 lb

## 2013-01-05 DIAGNOSIS — Z87440 Personal history of urinary (tract) infections: Secondary | ICD-10-CM

## 2013-01-05 DIAGNOSIS — R300 Dysuria: Secondary | ICD-10-CM | POA: Insufficient documentation

## 2013-01-05 LAB — POCT URINALYSIS DIPSTICK
Bilirubin, UA: NEGATIVE
Glucose, UA: NEGATIVE
Nitrite, UA: NEGATIVE
pH, UA: 6

## 2013-01-05 LAB — POCT UA - MICROSCOPIC ONLY

## 2013-01-05 MED ORDER — SULFAMETHOXAZOLE-TRIMETHOPRIM 800-160 MG PO TABS: 1.0000 | ORAL_TABLET | Freq: Two times a day (BID) | ORAL | Status: DC

## 2013-01-05 NOTE — Patient Instructions (Signed)
Urinary Tract Infection  A urinary tract infection (UTI) is often caused by a germ (bacteria). A UTI is usually helped with medicine (antibiotics) that kills germs. Take all the medicine until it is gone. Do this even if you are feeling better. You are usually better in 7 to 10 days.  HOME CARE    Drink enough water and fluids to keep your pee (urine) clear or pale yellow. Drink:   Cranberry juice.   Water.   Avoid:   Caffeine.   Tea.   Bubbly (carbonated) drinks.   Alcohol.   Only take medicine as told by your doctor.   To prevent further infections:   Pee often.   After pooping (bowel movement), women should wipe from front to back. Use each tissue only once.   Pee before and after having sex (intercourse).  Ask your doctor when your test results will be ready. Make sure you follow up and get your test results.   GET HELP RIGHT AWAY IF:    There is very bad back pain or lower belly (abdominal) pain.   You get the chills.   You have a fever.   Your baby is older than 3 months with a rectal temperature of 102 F (38.9 C) or higher.   Your baby is 3 months old or younger with a rectal temperature of 100.4 F (38 C) or higher.   You feel sick to your stomach (nauseous) or throw up (vomit).   There is continued burning with peeing.   Your problems are not better in 3 days. Return sooner if you are getting worse.  MAKE SURE YOU:    Understand these instructions.   Will watch your condition.   Will get help right away if you are not doing well or get worse.  Document Released: 03/08/2008 Document Revised: 12/13/2011 Document Reviewed: 03/08/2008  Exit[employer] Patient Information 2013 [employer].

## 2013-01-05 NOTE — Progress Notes (Signed)
  Subjective:    Patient ID: Kim Ross, female    DOB: Feb 25, 1956, 57 y.o.   MRN: [id number]  HPI  Kim Ross comes in complaining of pain with urination x 1 week.  She has had some suprapubic pain as well, and a little nausea.  No vomiting, no back pain, no fever.   Review of Systems See HPI    Objective:   Physical Exam BP 128/75  Pulse 89  Ht 5\' 4"  (1.626 m)  Wt 111 lb 11.2 oz (50.667 kg)  BMI 19.16 kg/m2 General appearance: alert, cooperative and no distress Back: symmetric, no curvature. ROM normal. No CVA tenderness. Abdomen: +BS, soft, mild suprapubic tenderness.       Assessment & Plan:

## 2013-01-05 NOTE — Assessment & Plan Note (Signed)
Urine concerning for UTI but with some epithelial cells.  Will treat with Bactrim.  Given hx of recurrent UTI's, will send for culture.

## 2013-01-06 LAB — URINE CULTURE

## 2013-04-10 ENCOUNTER — Encounter: Payer: Self-pay | Admitting: Family Medicine

## 2013-04-10 ENCOUNTER — Ambulatory Visit (INDEPENDENT_AMBULATORY_CARE_PROVIDER_SITE_OTHER): Payer: PRIVATE HEALTH INSURANCE | Admitting: Family Medicine

## 2013-04-10 VITALS — BP 125/77 | HR 99 | Temp 98.1°F | Ht 64.0 in | Wt 114.0 lb

## 2013-04-10 DIAGNOSIS — Z87440 Personal history of urinary (tract) infections: Secondary | ICD-10-CM

## 2013-04-10 DIAGNOSIS — F411 Generalized anxiety disorder: Secondary | ICD-10-CM

## 2013-04-10 DIAGNOSIS — R350 Frequency of micturition: Secondary | ICD-10-CM

## 2013-04-10 HISTORY — DX: Frequency of micturition: R35.0

## 2013-04-10 LAB — POCT URINALYSIS DIPSTICK
Bilirubin, UA: NEGATIVE
Glucose, UA: NEGATIVE
Nitrite, UA: NEGATIVE

## 2013-04-10 MED ORDER — SERTRALINE HCL 25 MG PO TABS: ORAL_TABLET | ORAL | Status: DC
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
## 2013-04-10 NOTE — Patient Instructions (Addendum)
1) Restart Zoloft - One tab, once a day for one week, then increase to two tabs, once a day for a week

## 2013-04-10 NOTE — Assessment & Plan Note (Signed)
1) Restart Zoloft, will tritiate slowly to prevent inc. Anxiety (GAD- 7= 16, somewhat difficult)       3,3,3,2,2,2,1  - 25mg  qd x 1wk  - 50mg  qd for second wk  - Return to clinic in 2wks for re-evaluation
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
## 2013-04-24 ENCOUNTER — Encounter: Payer: Self-pay | Admitting: Family Medicine

## 2013-04-24 ENCOUNTER — Other Ambulatory Visit: Payer: Self-pay | Admitting: Family Medicine

## 2013-04-24 ENCOUNTER — Ambulatory Visit (INDEPENDENT_AMBULATORY_CARE_PROVIDER_SITE_OTHER): Payer: PRIVATE HEALTH INSURANCE | Admitting: Family Medicine

## 2013-04-24 VITALS — BP 142/64 | HR 72 | Temp 98.8°F | Ht 64.0 in | Wt 115.0 lb

## 2013-04-24 DIAGNOSIS — M79674 Pain in right toe(s): Secondary | ICD-10-CM

## 2013-04-24 DIAGNOSIS — F411 Generalized anxiety disorder: Secondary | ICD-10-CM

## 2013-04-24 DIAGNOSIS — M79609 Pain in unspecified limb: Secondary | ICD-10-CM

## 2013-04-24 MED ORDER — SERTRALINE HCL 50 MG PO TABS: ORAL_TABLET | ORAL | Status: DC

## 2013-04-24 MED ORDER — TRAZODONE HCL 50 MG PO TABS: 50.0000 mg | ORAL_TABLET | Freq: Every day | ORAL | Status: DC

## 2013-04-24 NOTE — Assessment & Plan Note (Signed)
GAD: 12 PHQ: 7 - Mainly concerned with Anxiety effect on sleep and weight today - Did not inc Zoloft to 50mg  (just forgot- no inc jitteriness as before)  1) Inc Zoloft  - 50mg  for one week  - 100mg  for 2nd week  - 150mg  for 3rd week; continue until next visit 2) Start Trazadone 50mg  qhs 3) Discuss counseling options; will defer until next visit and see how medication is helping

## 2013-04-24 NOTE — Patient Instructions (Addendum)
It was great seeing you today. Below is a list of the things we talked about:   1) Increase Zoloft 50 mg for one week  - Then take 100mg  for one week  - Then 150mg  for week; continue on that dose until next visit  2) Starting Trazodone 50 mg at bedtime  3) See you back in three weeks to reassess  Please check-out at the front desk before leaving the clinic.  I look forward to talking with you again at our next visit. If you have any questions or concerns before then, please call the clinic at [phone].  See you soon,   Dr Wenda Low

## 2013-04-24 NOTE — Progress Notes (Signed)
[facility]  Patient name: Kim Ross MRN [id number]  Date of birth: 1956-07-22  CC & HPI:  Kim Ross is a 57 y.o. female presenting today for Anxiety and Wart on toe.   Anxiety - Continues to endorse anxiety w/ multiple social stressors - Difficulty sleeping, difficulty falling asleep and unable to return to sleep after awaking up  - has tolerated Zoloft 25 mg qd, but failed to increase  Toe Wart - Remains but decreased in size - No pain or difficulty after last cyro treatment  ROS:  Please See HPI above   Pertinent History Reviewed:  Medical & Surgical Hx:  Reviewed:  Medications & Allergies: Reviewed & Updated - see associated section  Social History: Reviewed:   reports that she has been smoking Cigarettes.  She has been smoking about 0.50 packs per day. She has never used smokeless tobacco.  History  Alcohol Use  . 0.6 oz/week  . 1 Cans of beer per week   History  Drug Use No    Objective Findings:  Vitals: BP 142/64  Pulse 72  Temp(Src) 98.8 F (37.1 C)  Wt 115 lb (52.164 kg)  BMI 19.73 kg/m2  Gen: NAD, Visible anxious  Foot: Wart on Rt 2nd toe  Assessment & Plan:   Please See Problem Focused Assessment & Plan

## 2013-04-27 ENCOUNTER — Other Ambulatory Visit: Payer: Self-pay | Admitting: *Deleted

## 2013-04-27 DIAGNOSIS — Z0000 Encounter for general adult medical examination without abnormal findings: Secondary | ICD-10-CM

## 2013-04-27 MED ORDER — MULTI-VITAMIN/MINERALS PO TABS: 1.0000 | ORAL_TABLET | Freq: Every day | ORAL | Status: DC

## 2013-05-14 ENCOUNTER — Ambulatory Visit: Payer: PRIVATE HEALTH INSURANCE | Admitting: Family Medicine

## 2013-05-21 ENCOUNTER — Other Ambulatory Visit: Payer: Self-pay | Admitting: *Deleted

## 2013-05-21 DIAGNOSIS — F411 Generalized anxiety disorder: Secondary | ICD-10-CM

## 2013-05-21 MED ORDER — SERTRALINE HCL 50 MG PO TABS: ORAL_TABLET | ORAL | Status: DC

## 2013-05-21 NOTE — Telephone Encounter (Signed)
Received [facility] from [facility] requesting 90-day Rx refill for sertraline 50 mg.  Gaylene Brooks, RN

## 2013-05-22 ENCOUNTER — Ambulatory Visit: Payer: PRIVATE HEALTH INSURANCE | Admitting: Family Medicine

## 2013-06-14 ENCOUNTER — Telehealth: Payer: Self-pay | Admitting: Family Medicine

## 2013-06-14 ENCOUNTER — Other Ambulatory Visit: Payer: Self-pay | Admitting: Family Medicine

## 2013-06-14 NOTE — Telephone Encounter (Signed)
Pt called and is requesting a refill on meclizine be sent to her pharmacy. JW

## 2013-06-14 NOTE — Telephone Encounter (Signed)
Spoke with pt and she informed me that she doesn't use her meclizine often but when she has has noticed that it doesn't work as well.  She noticed on the bottle that it expired in 2012.  Would like to know if she can get a few pills to last til her appt with you on 06-27-13.  Please advise.  Kim Ross,CMA

## 2013-06-15 ENCOUNTER — Other Ambulatory Visit: Payer: Self-pay | Admitting: *Deleted

## 2013-06-15 MED ORDER — MECLIZINE HCL 25 MG PO TABS: 25.0000 mg | ORAL_TABLET | Freq: Two times a day (BID) | ORAL | Status: DC | PRN

## 2013-06-15 NOTE — Telephone Encounter (Signed)
Please call to tell pt I have refilled her meclizine.  Thanks,  [employer]

## 2013-06-27 ENCOUNTER — Ambulatory Visit (INDEPENDENT_AMBULATORY_CARE_PROVIDER_SITE_OTHER): Payer: PRIVATE HEALTH INSURANCE | Admitting: Family Medicine

## 2013-06-27 ENCOUNTER — Encounter: Payer: Self-pay | Admitting: Family Medicine

## 2013-06-27 VITALS — BP 128/73 | HR 61 | Temp 98.0°F | Wt 115.0 lb

## 2013-06-27 DIAGNOSIS — F411 Generalized anxiety disorder: Secondary | ICD-10-CM

## 2013-06-27 DIAGNOSIS — Z23 Encounter for immunization: Secondary | ICD-10-CM

## 2013-06-27 DIAGNOSIS — R42 Dizziness and giddiness: Secondary | ICD-10-CM

## 2013-06-27 DIAGNOSIS — R21 Rash and other nonspecific skin eruption: Secondary | ICD-10-CM

## 2013-06-27 DIAGNOSIS — R350 Frequency of micturition: Secondary | ICD-10-CM

## 2013-06-27 LAB — POCT URINALYSIS DIPSTICK
Glucose, UA: NEGATIVE
Nitrite, UA: NEGATIVE
Spec Grav, UA: 1.015
Urobilinogen, UA: 0.2

## 2013-06-27 MED ORDER — MECLIZINE HCL 25 MG PO TABS: 25.0000 mg | ORAL_TABLET | Freq: Three times a day (TID) | ORAL | Status: DC | PRN

## 2013-06-27 MED ORDER — TRIAMCINOLONE ACETONIDE 0.1 % EX CREA: TOPICAL_CREAM | Freq: Two times a day (BID) | CUTANEOUS | Status: DC

## 2013-06-27 MED ORDER — CEPHALEXIN 500 MG PO CAPS: 500.0000 mg | ORAL_CAPSULE | Freq: Four times a day (QID) | ORAL | Status: DC

## 2013-06-27 NOTE — Patient Instructions (Addendum)
It was great seeing you today. Below is a list of the things we talked about:   1) Apply steroid cream to rash one to two times a day as need  2) Take Zyrtec or other over the counter antihistimine  3) We will conduct a hearing test to check for meniere disease  Please check-out at the front desk before leaving the clinic.  I look forward to talking with you again at our next visit. If you have any questions or concerns before then, please call the clinic at [phone].  See you soon,   Dr Wenda Low

## 2013-06-27 NOTE — Assessment & Plan Note (Signed)
Eczema vs contact dermatitis; No acute or systemic symptoms - Triamcinolone cream
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
## 2013-06-27 NOTE — Assessment & Plan Note (Signed)
Referred to Dr Pascal Lux- gave card and advised pt on how to schedule apt - She has not increased her Zoloft currently still taking 25 mg qd; Said she plans on increasing to 50mg  qd this weekend - Overall reports decreased anxiety though social stressors have not change; in fact there was a recent unexpected death of a friend - She took trazodone only once, and did not like the drowsiness she experienced in the morning

## 2013-06-27 NOTE — Assessment & Plan Note (Addendum)
-   UA today: Leu +, Nit (-); but MANY bacteria; Sent culture - Started Keflex 500mg  QID x 7 days - Discuss urological work-up at next visit

## 2013-06-28 LAB — URINE CULTURE
Colony Count: NO GROWTH
Organism ID, Bacteria: NO GROWTH

## 2013-07-20 ENCOUNTER — Telehealth: Payer: Self-pay | Admitting: Family Medicine

## 2013-07-20 NOTE — Telephone Encounter (Signed)
Patient dropped off form to be filled out for handicapped sticker.  Please call her when completed.

## 2013-07-20 NOTE — Telephone Encounter (Signed)
Clinic portion completed and placed in provider's hand. Jazmin Hartsell,CMA

## 2013-07-20 NOTE — Telephone Encounter (Signed)
Form completed at left at front desk for patient to pick up.   Kim Ross

## 2013-07-27 ENCOUNTER — Encounter: Payer: Self-pay | Admitting: Family Medicine

## 2013-07-27 ENCOUNTER — Ambulatory Visit (INDEPENDENT_AMBULATORY_CARE_PROVIDER_SITE_OTHER): Payer: PRIVATE HEALTH INSURANCE | Admitting: Family Medicine

## 2013-07-27 VITALS — BP 126/83 | HR 86 | Temp 98.6°F | Wt 115.0 lb

## 2013-07-27 DIAGNOSIS — F411 Generalized anxiety disorder: Secondary | ICD-10-CM

## 2013-07-27 DIAGNOSIS — R350 Frequency of micturition: Secondary | ICD-10-CM

## 2013-07-27 DIAGNOSIS — F17200 Nicotine dependence, unspecified, uncomplicated: Secondary | ICD-10-CM

## 2013-07-27 DIAGNOSIS — Z720 Tobacco use: Secondary | ICD-10-CM

## 2013-07-27 DIAGNOSIS — K625 Hemorrhage of anus and rectum: Secondary | ICD-10-CM

## 2013-07-27 LAB — POCT URINALYSIS DIPSTICK
Bilirubin, UA: NEGATIVE
Glucose, UA: NEGATIVE
Nitrite, UA: POSITIVE
Urobilinogen, UA: 0.2
pH, UA: 6.5

## 2013-07-27 LAB — POCT UA - MICROSCOPIC ONLY

## 2013-07-27 MED ORDER — CEPHALEXIN 500 MG PO CAPS: 500.0000 mg | ORAL_CAPSULE | Freq: Four times a day (QID) | ORAL | Status: DC

## 2013-07-27 NOTE — Assessment & Plan Note (Signed)
A: Motivated to quit b/c sister recently diagnosed with Lung CA; Currently smokes 1/2 pack day x 30 years; Concurrent Anxiety & Sleep disorder P: Referral to Dr Raymondo Band for Smoking cessation counseling and PFTs (Pt recently called Quit-line and says she will get Nicotine patches from Drug store)

## 2013-07-27 NOTE — Assessment & Plan Note (Signed)
A: Bleeding has resolved. Discussed colonoscopy but unable to afford P: FOBT cards provided.

## 2013-07-27 NOTE — Assessment & Plan Note (Signed)
A: Anxiety improving, but no wanted to increase Zoloft past 50mg  qd due to past "jitteriness" w/ higher doses; Sleep disturbance (Anxiety and Nicotine likely contributing) P: Referral to Dr Pascal Lux
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
## 2013-07-27 NOTE — Patient Instructions (Signed)
It was great seeing you today. Below is a list of the things we talked about:   1. Please call to schedule visit with Dr Pascal Lux for Anxiety @ [phone].  2. Send in Results for Mammogram to Dr Gayla Doss at [facility] 3. Send in Stool cards when completed 4. I'm referring to a urologist 5. Take Keflex 500mg  four times a day for 7 days  Please bring all your medications to every doctors visit  Sign up for My Chart to have easy access to your labs results, and communication with your Primary care physician.   Please check-out at the front desk before leaving the clinic.   Make an appointment Dr Raymondo Band for Lung function test and Smoking Cessation.   I look forward to talking with you again at our next visit. If you have any questions or concerns before then, please call the clinic at [phone].  Take Care,   Dr Wenda Low
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
## 2013-07-31 ENCOUNTER — Other Ambulatory Visit: Payer: Self-pay | Admitting: Family Medicine

## 2013-07-31 DIAGNOSIS — Z1231 Encounter for screening mammogram for malignant neoplasm of breast: Secondary | ICD-10-CM

## 2013-08-01 LAB — POC HEMOCCULT BLD/STL (HOME/3-CARD/SCREEN)
Card #2 Fecal Occult Blod, POC: POSITIVE
Card #3 Fecal Occult Blood, POC: NEGATIVE
Fecal Occult Blood, POC: NEGATIVE

## 2013-08-01 NOTE — Addendum Note (Signed)
Addended by: [place], Remberto Lienhard on: 08/01/2013 10:22 AM   Modules accepted: Orders

## 2013-08-07 ENCOUNTER — Ambulatory Visit: Payer: PRIVATE HEALTH INSURANCE | Admitting: Pharmacist

## 2013-08-10 ENCOUNTER — Ambulatory Visit (HOSPITAL_COMMUNITY): Payer: PRIVATE HEALTH INSURANCE

## 2013-08-21 ENCOUNTER — Ambulatory Visit (HOSPITAL_COMMUNITY): Payer: PRIVATE HEALTH INSURANCE | Attending: Family Medicine

## 2013-08-27 ENCOUNTER — Telehealth: Payer: Self-pay | Admitting: Family Medicine

## 2013-08-27 DIAGNOSIS — N390 Urinary tract infection, site not specified: Secondary | ICD-10-CM

## 2013-08-27 MED ORDER — CIPROFLOXACIN HCL 250 MG PO TABS: 250.0000 mg | ORAL_TABLET | Freq: Two times a day (BID) | ORAL | Status: DC

## 2013-08-27 NOTE — Telephone Encounter (Signed)
Pt would like another antibotic . She still has symptons of UTI like strong urine. She is leaving today at 6:45 to go [place] due to death of sister. [facility] [address]

## 2013-08-27 NOTE — Telephone Encounter (Signed)
Please call to let her know I have sent a Rx for Cipro to her pharmacy  Thanks,  Chanetta Marshall

## 2013-08-27 NOTE — Telephone Encounter (Signed)
Pt informed. [name], Kim Ross  

## 2013-09-25 ENCOUNTER — Ambulatory Visit: Payer: Self-pay

## 2013-10-31 ENCOUNTER — Other Ambulatory Visit: Payer: Self-pay | Admitting: Family Medicine

## 2013-10-31 ENCOUNTER — Telehealth: Payer: Self-pay | Admitting: Family Medicine

## 2013-10-31 DIAGNOSIS — R42 Dizziness and giddiness: Secondary | ICD-10-CM

## 2013-10-31 MED ORDER — MECLIZINE HCL 25 MG PO TABS: 25.0000 mg | ORAL_TABLET | Freq: Three times a day (TID) | ORAL | Status: DC | PRN

## 2013-10-31 NOTE — Telephone Encounter (Signed)
Pt called and needs a refill on her Meclizine sent to her pharmacy. jw

## 2013-11-09 ENCOUNTER — Telehealth: Payer: Self-pay | Admitting: Family Medicine

## 2013-11-09 NOTE — Telephone Encounter (Signed)
Pt called and would like a prescription called for a UTI. jw

## 2013-11-21 ENCOUNTER — Encounter: Payer: Self-pay | Admitting: Family Medicine

## 2013-11-21 ENCOUNTER — Ambulatory Visit (INDEPENDENT_AMBULATORY_CARE_PROVIDER_SITE_OTHER): Payer: 59 | Admitting: Family Medicine

## 2013-11-21 VITALS — BP 164/77 | HR 93 | Temp 97.0°F | Ht 64.0 in | Wt 125.0 lb

## 2013-11-21 DIAGNOSIS — R42 Dizziness and giddiness: Secondary | ICD-10-CM

## 2013-11-21 DIAGNOSIS — R350 Frequency of micturition: Secondary | ICD-10-CM

## 2013-11-21 DIAGNOSIS — R300 Dysuria: Secondary | ICD-10-CM

## 2013-11-21 LAB — POCT UA - MICROSCOPIC ONLY

## 2013-11-21 LAB — POCT URINALYSIS DIPSTICK
Bilirubin, UA: NEGATIVE
Glucose, UA: NEGATIVE
Ketones, UA: NEGATIVE
Leukocytes, UA: NEGATIVE
Nitrite, UA: NEGATIVE
PROTEIN UA: NEGATIVE
Spec Grav, UA: 1.025
Urobilinogen, UA: 0.2
pH, UA: 6

## 2013-11-21 MED ORDER — MECLIZINE HCL 25 MG PO TABS: 25.0000 mg | ORAL_TABLET | Freq: Three times a day (TID) | ORAL | Status: DC | PRN

## 2013-11-21 NOTE — Patient Instructions (Signed)
It was great seeing you today.   1. There are no signs of infection in you urine today. I will follow-up on the urology referral and try to get you in to see them as soon as possible.  2. I have refilled your meclizine    Please bring all your medications to every doctors visit  Sign up for My Chart to have easy access to your labs results, and communication with your Primary care physician.  Next Appointment  Please call to make an appointment with Dr Berkley Harvey in 6 months of soon if needed   I look forward to talking with you again at our next visit. If you have any questions or concerns before then, please call the clinic at [phone].  Take Care,   Dr Phill Myron

## 2013-11-21 NOTE — Assessment & Plan Note (Addendum)
Complains of intermittent feelings of "off balance" different from her previous vertigo symptoms - I think this is strongly related to her anxiety, and recent death of her sister - Refilled meclizine - as she reports benefit with this - Advised increasing Zoloft to 50mg  qd (which she had not done yet)

## 2013-11-21 NOTE — Assessment & Plan Note (Signed)
Thinks she has another UTI, but denies dysuria, freq/urgency, fevers, chills - UA neg for infection - Will follow-up on previous Urology referral  - Advised her to call if she has UTI symptoms in future and will have her come in for a lab visit to give Urine sample

## 2013-11-21 NOTE — Progress Notes (Signed)
Subjective:     Patient ID: Kim Ross, female   DOB: 23-May-1956, 58 y.o.   MRN: [id number]  HPI Comments: She comes in today to have her urine checked because she feels like she has another UTI. She reports feeling like her urine is "warmer" than usual but denies dysuria or frequency. She also notes some dizziness "feeling like I'm off balance". She denies hearing loss or tinnitus. Denies fevers, chills, abdominal or flank pain.     Review of Systems  Constitutional: Positive for fatigue. Negative for fever and activity change.  HENT: Positive for congestion and sinus pressure. Negative for ear discharge, ear pain and tinnitus.   Gastrointestinal: Negative for abdominal pain.  Genitourinary: Negative for dysuria, urgency, frequency, hematuria and flank pain.  Neurological: Positive for dizziness.       Objective:   Physical Exam  Constitutional: She appears well-developed and well-nourished.  HENT:  Right Ear: External ear normal.  Left Ear: External ear normal.  Mouth/Throat: Oropharynx is clear and moist.  Eyes: Conjunctivae are normal. Pupils are equal, round, and reactive to light.  Cardiovascular: Normal rate, regular rhythm and normal heart sounds.   No murmur heard. Pulmonary/Chest: Effort normal and breath sounds normal. No respiratory distress.  Abdominal: Soft. There is no tenderness.  No CVA tenderness    Assessment/Plan:      See Problem Focused Assessment & Plan

## 2013-12-03 ENCOUNTER — Other Ambulatory Visit: Payer: Self-pay | Admitting: Family Medicine

## 2013-12-03 DIAGNOSIS — Z1231 Encounter for screening mammogram for malignant neoplasm of breast: Secondary | ICD-10-CM

## 2013-12-06 ENCOUNTER — Ambulatory Visit (HOSPITAL_COMMUNITY)
Admission: RE | Admit: 2013-12-06 | Discharge: 2013-12-06 | Disposition: A | Discharge status: Home / Self Care | Payer: 59 | Source: Ambulatory Visit | Attending: Family Medicine | Admitting: Family Medicine

## 2013-12-06 DIAGNOSIS — Z1231 Encounter for screening mammogram for malignant neoplasm of breast: Secondary | ICD-10-CM | POA: Insufficient documentation

## 2013-12-28 ENCOUNTER — Encounter: Payer: Self-pay | Admitting: Family Medicine

## 2014-01-28 ENCOUNTER — Other Ambulatory Visit (INDEPENDENT_AMBULATORY_CARE_PROVIDER_SITE_OTHER): Payer: 59

## 2014-01-28 ENCOUNTER — Other Ambulatory Visit: Payer: Self-pay | Admitting: Family Medicine

## 2014-01-28 DIAGNOSIS — R300 Dysuria: Secondary | ICD-10-CM

## 2014-01-28 LAB — POCT URINALYSIS DIPSTICK
Bilirubin, UA: NEGATIVE
Blood, UA: NEGATIVE
Glucose, UA: NEGATIVE
KETONES UA: NEGATIVE
NITRITE UA: POSITIVE
PROTEIN UA: NEGATIVE
Spec Grav, UA: 1.015
Urobilinogen, UA: 1
pH, UA: 8.5

## 2014-01-28 MED ORDER — CEPHALEXIN 500 MG PO CAPS: 500.0000 mg | ORAL_CAPSULE | Freq: Four times a day (QID) | ORAL | Status: DC

## 2014-01-28 NOTE — Progress Notes (Signed)
Pt walked in clinic requesting a UA for symptoms of UTI.  Pt triaged by nurse.  Pt stated she has had back pain x 2 weeks, burning and a strong odor with urination.  Pt stated that PCP told her she could walk in anytime when she started having these symptoms.  Pt informed per doctors's note on  11/2013, it seems to be a one time order.  Pt advised she will need to schedule appt if symptoms continue.  Will forward to PCP and Dr. Lacinda Axon.  Derl Barrow, RN

## 2014-01-31 ENCOUNTER — Encounter: Payer: Self-pay | Admitting: Family Medicine

## 2014-01-31 DIAGNOSIS — Z87440 Personal history of urinary (tract) infections: Secondary | ICD-10-CM | POA: Insufficient documentation

## 2014-01-31 LAB — URINE CULTURE: Colony Count: >=100000

## 2014-02-26 ENCOUNTER — Other Ambulatory Visit: Payer: Self-pay | Admitting: Family Medicine

## 2014-03-21 ENCOUNTER — Other Ambulatory Visit: Payer: Self-pay | Admitting: *Deleted

## 2014-03-21 ENCOUNTER — Other Ambulatory Visit: Payer: 59

## 2014-03-21 DIAGNOSIS — R350 Frequency of micturition: Secondary | ICD-10-CM

## 2014-03-21 NOTE — Progress Notes (Signed)
Per MD orders in [place] from 11/21/13, pt is to come in when she has symptoms and give a sample.  Pt called requesting lab appt today.  Future order placed. [name], Salome Spotted

## 2014-03-22 ENCOUNTER — Other Ambulatory Visit (INDEPENDENT_AMBULATORY_CARE_PROVIDER_SITE_OTHER): Payer: 59

## 2014-03-22 ENCOUNTER — Telehealth: Payer: Self-pay | Admitting: Family Medicine

## 2014-03-22 DIAGNOSIS — R350 Frequency of micturition: Secondary | ICD-10-CM

## 2014-03-22 LAB — POCT UA - MICROSCOPIC ONLY

## 2014-03-22 LAB — POCT URINALYSIS DIPSTICK
Bilirubin, UA: NEGATIVE
GLUCOSE UA: NEGATIVE
Ketones, UA: NEGATIVE
LEUKOCYTES UA: NEGATIVE
NITRITE UA: POSITIVE
Spec Grav, UA: 1.025
Urobilinogen, UA: 0.2
pH, UA: 6.5

## 2014-03-22 MED ORDER — CEPHALEXIN 500 MG PO CAPS: 500.0000 mg | ORAL_CAPSULE | Freq: Three times a day (TID) | ORAL | Status: DC

## 2014-03-22 NOTE — Progress Notes (Signed)
Per Dr. Milagros Reap note, pt came in for lab only visit and gave a urine.  MD paged and Dr. Erin Hearing notified.

## 2014-03-22 NOTE — Telephone Encounter (Signed)
Called disussed recent UA.  No fever or nausea or vomiting but does have her usual UTI symptoms  Recommend Take antibiotics x 7 days  Follow up with Dr Berkley Harvey to discuss prvention  Come in if fever or nausea or vomiting or not better in 3-4 days

## 2014-07-20 ENCOUNTER — Other Ambulatory Visit: Payer: Self-pay | Admitting: Family Medicine

## 2014-10-28 ENCOUNTER — Other Ambulatory Visit: Payer: Self-pay | Admitting: Family Medicine

## 2014-10-28 DIAGNOSIS — F411 Generalized anxiety disorder: Secondary | ICD-10-CM

## 2014-12-03 ENCOUNTER — Other Ambulatory Visit: Payer: Self-pay | Admitting: Family Medicine

## 2014-12-03 DIAGNOSIS — F411 Generalized anxiety disorder: Secondary | ICD-10-CM

## 2014-12-03 MED ORDER — SERTRALINE HCL 50 MG PO TABS: ORAL_TABLET | ORAL | Status: DC

## 2014-12-03 NOTE — Telephone Encounter (Signed)
Called and verified pt continues to take 50mg  of zoloft daily with improved mood. She is not interested in increasing dose at this time. Rx sent for zoloft 50mg  qd.

## 2014-12-09 ENCOUNTER — Other Ambulatory Visit: Payer: Self-pay | Admitting: Family Medicine

## 2014-12-09 DIAGNOSIS — R42 Dizziness and giddiness: Secondary | ICD-10-CM

## 2014-12-11 ENCOUNTER — Other Ambulatory Visit (HOSPITAL_COMMUNITY): Payer: Self-pay | Admitting: Primary Care

## 2014-12-19 ENCOUNTER — Other Ambulatory Visit (HOSPITAL_COMMUNITY): Payer: Self-pay | Admitting: Primary Care

## 2014-12-19 DIAGNOSIS — Z1231 Encounter for screening mammogram for malignant neoplasm of breast: Secondary | ICD-10-CM

## 2014-12-26 ENCOUNTER — Ambulatory Visit (HOSPITAL_COMMUNITY): Payer: Self-pay

## 2014-12-31 ENCOUNTER — Ambulatory Visit (HOSPITAL_COMMUNITY)
Admission: RE | Admit: 2014-12-31 | Discharge: 2014-12-31 | Disposition: A | Discharge status: Home / Self Care | Payer: Self-pay | Source: Ambulatory Visit | Attending: Primary Care | Admitting: Primary Care

## 2014-12-31 DIAGNOSIS — Z1231 Encounter for screening mammogram for malignant neoplasm of breast: Secondary | ICD-10-CM

## 2015-03-26 ENCOUNTER — Telehealth: Payer: Self-pay | Admitting: Family Medicine

## 2015-03-26 DIAGNOSIS — R350 Frequency of micturition: Secondary | ICD-10-CM

## 2015-03-26 DIAGNOSIS — Z87440 Personal history of urinary (tract) infections: Secondary | ICD-10-CM

## 2015-03-26 NOTE — Telephone Encounter (Signed)
Will forward to MD to see if he is ok with patient coming in for urinalysis. Jazmin Hartsell,CMA

## 2015-03-26 NOTE — Telephone Encounter (Signed)
Patient is aware and appt made. Matthieu Loftus,CMA

## 2015-03-26 NOTE — Telephone Encounter (Signed)
Pt called because she would like orders put in for labs so that she can see if she has a UTI. She said that Dr. Berkley Harvey told her to call when and if she needed to do this. Please let patient know when orders are in. jw

## 2015-03-27 ENCOUNTER — Other Ambulatory Visit (INDEPENDENT_AMBULATORY_CARE_PROVIDER_SITE_OTHER): Payer: Self-pay

## 2015-03-27 DIAGNOSIS — R350 Frequency of micturition: Secondary | ICD-10-CM

## 2015-03-27 DIAGNOSIS — Z87440 Personal history of urinary (tract) infections: Secondary | ICD-10-CM

## 2015-03-27 LAB — POCT URINALYSIS DIPSTICK
Bilirubin, UA: NEGATIVE
Glucose, UA: NEGATIVE
KETONES UA: NEGATIVE
Leukocytes, UA: NEGATIVE
Nitrite, UA: NEGATIVE
Protein, UA: NEGATIVE
SPEC GRAV UA: 1.025
Urobilinogen, UA: 0.2
pH, UA: 6

## 2015-03-27 LAB — POCT UA - MICROSCOPIC ONLY

## 2015-03-27 NOTE — Progress Notes (Unsigned)
ua and urine culture done today [facility] Brunette Lavalle

## 2015-03-29 LAB — URINE CULTURE
Colony Count: NO GROWTH
Organism ID, Bacteria: NO GROWTH

## 2015-04-01 ENCOUNTER — Telehealth: Payer: Self-pay | Admitting: Family Medicine

## 2015-04-01 DIAGNOSIS — R350 Frequency of micturition: Secondary | ICD-10-CM

## 2015-04-01 DIAGNOSIS — R319 Hematuria, unspecified: Secondary | ICD-10-CM | POA: Insufficient documentation

## 2015-04-01 DIAGNOSIS — Z87440 Personal history of urinary (tract) infections: Secondary | ICD-10-CM

## 2015-04-01 NOTE — Telephone Encounter (Signed)
Called and discussed Urine results with patient. No signs of infection but blood was present. She never was seen by urology as previously referred. She continue to endorse urinary frequency and incontinence.  - Referred to urology for evaluation

## 2015-04-29 ENCOUNTER — Other Ambulatory Visit: Payer: Self-pay | Admitting: Family Medicine

## 2015-05-28 ENCOUNTER — Telehealth: Payer: Self-pay | Admitting: Family Medicine

## 2015-05-28 NOTE — Telephone Encounter (Addendum)
The referral was placed in June for her referral.  I'm on the phone waiting to talk with [facility] now.  I'm trying to find out when patient's appt was made for today.  The referral from June should have been used for today if she wasn't able to get in until today.   I will keep you posted on what happened  Disregard the message above. Patient did not inform provider that she had an [name].  Previous referral made was invalid.

## 2015-06-26 ENCOUNTER — Other Ambulatory Visit: Payer: Self-pay | Admitting: Family Medicine

## 2015-06-27 ENCOUNTER — Other Ambulatory Visit: Payer: Self-pay | Admitting: Family Medicine

## 2015-06-27 DIAGNOSIS — R350 Frequency of micturition: Secondary | ICD-10-CM

## 2015-06-27 DIAGNOSIS — R319 Hematuria, unspecified: Secondary | ICD-10-CM

## 2015-07-10 ENCOUNTER — Ambulatory Visit (HOSPITAL_COMMUNITY)
Admission: RE | Admit: 2015-07-10 | Discharge: 2015-07-10 | Disposition: A | Discharge status: Home / Self Care | Payer: Medicare Other | Source: Ambulatory Visit | Attending: Family Medicine | Admitting: Family Medicine

## 2015-07-10 ENCOUNTER — Encounter (HOSPITAL_COMMUNITY): Payer: Self-pay

## 2015-07-10 DIAGNOSIS — R350 Frequency of micturition: Secondary | ICD-10-CM | POA: Insufficient documentation

## 2015-07-10 DIAGNOSIS — I700 Atherosclerosis of aorta: Secondary | ICD-10-CM | POA: Insufficient documentation

## 2015-07-10 DIAGNOSIS — R319 Hematuria, unspecified: Secondary | ICD-10-CM

## 2015-07-10 MED ORDER — IOHEXOL 300 MG/ML  SOLN
100.0000 mL | Freq: Once | INTRAMUSCULAR | Status: AC | PRN
  Administered 2015-07-10: 100 mL via INTRAVENOUS

## 2015-07-24 ENCOUNTER — Ambulatory Visit (INDEPENDENT_AMBULATORY_CARE_PROVIDER_SITE_OTHER): Payer: Self-pay | Admitting: Family Medicine

## 2015-07-24 ENCOUNTER — Encounter: Payer: Self-pay | Admitting: Family Medicine

## 2015-07-24 VITALS — BP 128/80 | HR 72 | Temp 98.1°F | Ht 65.0 in | Wt 149.0 lb

## 2015-07-24 DIAGNOSIS — B353 Tinea pedis: Secondary | ICD-10-CM

## 2015-07-24 DIAGNOSIS — R32 Unspecified urinary incontinence: Secondary | ICD-10-CM

## 2015-07-24 LAB — POCT URINALYSIS DIPSTICK
Bilirubin, UA: NEGATIVE
Glucose, UA: NEGATIVE
LEUKOCYTES UA: NEGATIVE
Nitrite, UA: NEGATIVE
PH UA: 6.5
PROTEIN UA: NEGATIVE
SPEC GRAV UA: 1.025
UROBILINOGEN UA: 0.2

## 2015-07-24 MED ORDER — KETOCONAZOLE 2 % EX CREA: 1.0000 "application " | TOPICAL_CREAM | Freq: Every day | CUTANEOUS | Status: DC

## 2015-07-24 NOTE — Assessment & Plan Note (Signed)
Patient's signs and symptoms most consistent with hyperkeratotic tinea pedis. Unable to perform skin scrapings with KOH assessment due to a shortage in lab staff today in clinic. Will treat based on history and physical alone. - Ketoconazole cream, apply once daily 4 weeks - When able keep foot exposed with the ability to "breathe" - Always wear clean dry socks and comfortable/"breathable" footwear.  - Follow-up in 4-6 weeks when necessary

## 2015-07-24 NOTE — Patient Instructions (Addendum)
It was a pleasure seeing you today in our clinic. Today we discussed your foot discomfort. Here is the treatment plan we have discussed and agreed upon together:   - I have written a prescription for ketoconazole cream. Apply this to the affected foot once a day for the next 4 weeks. - Make sure to use dry clean socks every day when wearing footwear. - While at home removing all footwear and allowing your feet to "breathe".  - Follow-up in 4 weeks if these symptoms persist.

## 2015-07-24 NOTE — Progress Notes (Signed)
RASH Left foot, medial aspect of the foot. Dryness.   Had rash for 1 month. Location: left foot Medications tried: goldbond, steroid cream (daughter's eczema), antibiotic ointment (OTC) Similar rash in past: yes, 5-6 years New medications or antibiotics: no Tick, Insect or new pet exposure: no Recent travel: no New detergent or soap: no Immunocompromised: no  Symptoms Itching: occasionally Pain over rash: no Feeling ill all over: no Fever: no Mouth sores: no Face or tongue swelling: no Trouble breathing: no Joint swelling or pain: no  Review of Symptoms - see HPI PMH - Smoking status noted.    Objective: BP 128/80 mmHg  Pulse 72  Temp(Src) 98.1 F (36.7 C) (Oral)  Ht '5\' 5"'$  (1.651 m)  Wt 149 lb (67.586 kg)  BMI 24.79 kg/m2 Gen: NAD, alert, cooperative, and pleasant. Right Foot/Ankle: No visible erythema or swelling. Malodorous. Extensive dryness noted primarily over the medial aspect of the forefoot and heel. Dryness and scaliness extends across the plantar surface to the inferior most aspect of the lateral side of foot. No evidence of open lesions or sores. Range of motion is full in all directions. Strength is 5/5 in all directions. Stable lateral and medial ligaments; squeeze test and kleiger test unremarkable; Talar dome nontender; No pain at base of 5th MT; No tenderness over cuboid; No tenderness over N spot or navicular prominence No tenderness on posterior aspects of lateral and medial malleolus No sign of peroneal tendon subluxations; Negative tarsal tunnel tinel's Able to walk 4 steps. Ext: No edema, warm Neuro: Alert and oriented, Speech clear, No gross deficits  Assessment and plan:  Tinea pedis of right foot Patient's signs and symptoms most consistent with hyperkeratotic tinea pedis. Unable to perform skin scrapings with KOH assessment due to a shortage in lab staff today in clinic. Will treat based on history and physical alone. - Ketoconazole  cream, apply once daily 4 weeks - When able keep foot exposed with the ability to "breathe" - Always wear clean dry socks and comfortable/"breathable" footwear.  - Follow-up in 4-6 weeks when necessary    Orders Placed This Encounter  Procedures  . POCT urinalysis dipstick    Meds ordered this encounter  Medications  . ketoconazole (NIZORAL) 2 % cream    Sig: Apply 1 application topically daily. For the next 4 weeks.    Dispense:  15 g    Refill:  2     Elberta Leatherwood, MD,MS,  PGY2 07/24/2015 5:09 PM

## 2015-08-08 ENCOUNTER — Encounter: Payer: Self-pay | Admitting: Family Medicine

## 2015-08-08 DIAGNOSIS — R32 Unspecified urinary incontinence: Secondary | ICD-10-CM | POA: Insufficient documentation

## 2015-08-27 ENCOUNTER — Telehealth: Payer: Self-pay | Admitting: Family Medicine

## 2015-08-27 NOTE — Telephone Encounter (Signed)
Clinic portion completed and placed in providers box. Jazmin Hartsell,CMA  

## 2015-08-27 NOTE — Telephone Encounter (Signed)
Patient dropped off handicapped placard form to be filled out.  Please mail to her when completed.

## 2015-09-01 NOTE — Telephone Encounter (Signed)
You don't have anymore appt for the year.  Is she able to see another provider for this appt?  Jazmin Hartsell,CMA

## 2015-09-01 NOTE — Telephone Encounter (Signed)
Please call and have her make appointment with me to discuss handicap placard and to fill out if needed.

## 2015-09-01 NOTE — Telephone Encounter (Signed)
Best if you can double book her in my clinic. If that doesn't work and she wants this done prior to new year, then she can she someone else.

## 2015-09-02 NOTE — Telephone Encounter (Signed)
LM for patient to call back.  Ok to [employer] patient with Dr. Berkley Harvey.  Please assist her in this when she calls back. Alicianna Litchford,CMA

## 2015-09-09 ENCOUNTER — Other Ambulatory Visit: Payer: Self-pay | Admitting: Family Medicine

## 2015-09-16 ENCOUNTER — Ambulatory Visit: Payer: Medicare Other

## 2015-09-19 ENCOUNTER — Ambulatory Visit (INDEPENDENT_AMBULATORY_CARE_PROVIDER_SITE_OTHER): Payer: Self-pay | Admitting: Family Medicine

## 2015-09-19 ENCOUNTER — Ambulatory Visit: Payer: Medicare Other | Admitting: Family Medicine

## 2015-09-19 VITALS — BP 118/86 | HR 90 | Temp 98.2°F | Ht 65.0 in | Wt 152.6 lb

## 2015-09-19 DIAGNOSIS — M79604 Pain in right leg: Secondary | ICD-10-CM | POA: Insufficient documentation

## 2015-09-19 DIAGNOSIS — R29898 Other symptoms and signs involving the musculoskeletal system: Secondary | ICD-10-CM

## 2015-09-19 DIAGNOSIS — M79605 Pain in left leg: Secondary | ICD-10-CM

## 2015-09-19 DIAGNOSIS — Z114 Encounter for screening for human immunodeficiency virus [HIV]: Secondary | ICD-10-CM

## 2015-09-19 DIAGNOSIS — G6289 Other specified polyneuropathies: Secondary | ICD-10-CM

## 2015-09-19 DIAGNOSIS — R5382 Chronic fatigue, unspecified: Secondary | ICD-10-CM

## 2015-09-19 DIAGNOSIS — Z23 Encounter for immunization: Secondary | ICD-10-CM

## 2015-09-19 DIAGNOSIS — E538 Deficiency of other specified B group vitamins: Secondary | ICD-10-CM

## 2015-09-19 MED ORDER — GABAPENTIN 100 MG PO CAPS: 100.0000 mg | ORAL_CAPSULE | Freq: Three times a day (TID) | ORAL | Status: DC

## 2015-09-19 NOTE — Patient Instructions (Signed)
It was great seeing you today.   I have order some labs today to check on your leg weakness and pain. I will send you a letter with the results, or call you if we need to make any changes to your current therapies.  I have referred you to physical medicine and rehabilitation for further evaluation of your leg pain and weakness.  Start gabapentin: 100 mg 3 times a day as needed.  Increase as tolerated up to 300 mg 3 times a day   Please bring all your medications to every doctors visit  Sign up for My Chart to have easy access to your labs results, and communication with your Primary care physician.  Next Appointment  Please call to make an appointment with Dr Berkley Harvey in 1 month   I look forward to talking with you again at our next visit. If you have any questions or concerns before then, please call the clinic at [phone].  Take Care,   Dr Phill Myron
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
## 2015-09-25 ENCOUNTER — Ambulatory Visit: Payer: Medicare Other

## 2015-09-27 ENCOUNTER — Other Ambulatory Visit: Payer: Self-pay | Admitting: Family Medicine

## 2015-10-26 ENCOUNTER — Encounter (HOSPITAL_COMMUNITY): Payer: Self-pay | Admitting: Emergency Medicine

## 2015-10-26 ENCOUNTER — Emergency Department (HOSPITAL_COMMUNITY)
Admission: EM | Admit: 2015-10-26 | Discharge: 2015-10-26 | Discharge status: Against Medical Advice | Payer: Medicare Other | Attending: Emergency Medicine | Admitting: Emergency Medicine

## 2015-10-26 DIAGNOSIS — M79605 Pain in left leg: Secondary | ICD-10-CM | POA: Insufficient documentation

## 2015-10-26 DIAGNOSIS — F17210 Nicotine dependence, cigarettes, uncomplicated: Secondary | ICD-10-CM | POA: Insufficient documentation

## 2015-10-26 NOTE — ED Notes (Signed)
Called pt back to fast track with no response

## 2015-10-26 NOTE — ED Notes (Signed)
Pt called from lobby, no responce
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
## 2015-10-26 NOTE — ED Notes (Signed)
Pt called, no responce

## 2015-10-27 ENCOUNTER — Emergency Department (HOSPITAL_COMMUNITY)
Admission: EM | Admit: 2015-10-27 | Discharge: 2015-10-27 | Disposition: A | Discharge status: Home / Self Care | Payer: Medicare Other | Attending: Emergency Medicine | Admitting: Emergency Medicine

## 2015-10-27 ENCOUNTER — Encounter (HOSPITAL_COMMUNITY): Payer: Self-pay | Admitting: Emergency Medicine

## 2015-10-27 DIAGNOSIS — F17210 Nicotine dependence, cigarettes, uncomplicated: Secondary | ICD-10-CM | POA: Insufficient documentation

## 2015-10-27 DIAGNOSIS — M79605 Pain in left leg: Secondary | ICD-10-CM

## 2015-10-27 DIAGNOSIS — F419 Anxiety disorder, unspecified: Secondary | ICD-10-CM | POA: Insufficient documentation

## 2015-10-27 DIAGNOSIS — Z87440 Personal history of urinary (tract) infections: Secondary | ICD-10-CM | POA: Insufficient documentation

## 2015-10-27 DIAGNOSIS — F329 Major depressive disorder, single episode, unspecified: Secondary | ICD-10-CM | POA: Insufficient documentation

## 2015-10-27 DIAGNOSIS — Z7952 Long term (current) use of systemic steroids: Secondary | ICD-10-CM | POA: Insufficient documentation

## 2015-10-27 DIAGNOSIS — Z86718 Personal history of other venous thrombosis and embolism: Secondary | ICD-10-CM | POA: Insufficient documentation

## 2015-10-27 DIAGNOSIS — M79652 Pain in left thigh: Secondary | ICD-10-CM | POA: Insufficient documentation

## 2015-10-27 DIAGNOSIS — Z79899 Other long term (current) drug therapy: Secondary | ICD-10-CM | POA: Insufficient documentation

## 2015-10-27 LAB — D-DIMER, QUANTITATIVE: D-Dimer, Quant: <0.27 ug/mL-FEU (ref 0.00–0.50)

## 2015-10-27 MED ORDER — IBUPROFEN 800 MG PO TABS
800.0000 mg | ORAL_TABLET | Freq: Once | ORAL | Status: AC
  Administered 2015-10-27: 800 mg via ORAL
  Filled 2015-10-27: qty 1

## 2015-10-27 MED ORDER — METHOCARBAMOL 500 MG PO TABS: 500.0000 mg | ORAL_TABLET | Freq: Two times a day (BID) | ORAL | Status: DC

## 2015-10-27 MED ORDER — IBUPROFEN 800 MG PO TABS: 800.0000 mg | ORAL_TABLET | Freq: Three times a day (TID) | ORAL | Status: DC

## 2015-10-27 NOTE — Discharge Instructions (Signed)
[name],  Nice meeting you! Please follow-up with your primary care provider or orthopedics. Return to the emergency department if you have increased pain, shortness of breath, chest pain. Feel better soon!  S. Wendie Simmer, PA-C   Musculoskeletal Pain Musculoskeletal pain is muscle and boney aches and pains. These pains can occur in any part of the body. Your caregiver may treat you without knowing the cause of the pain. They may treat you if blood or urine tests, X-rays, and other tests were normal.  CAUSES There is often not a definite cause or reason for these pains. These pains may be caused by a type of germ (virus). The discomfort may also come from overuse. Overuse includes working out too hard when your body is not fit. Boney aches also come from weather changes. Bone is sensitive to atmospheric pressure changes. HOME CARE INSTRUCTIONS   Ask when your test results will be ready. Make sure you get your test results.  Only take over-the-counter or prescription medicines for pain, discomfort, or fever as directed by your caregiver. If you were given medications for your condition, do not drive, operate machinery or power tools, or sign legal documents for 24 hours. Do not drink alcohol. Do not take sleeping pills or other medications that may interfere with treatment.  Continue all activities unless the activities cause more pain. When the pain lessens, slowly resume normal activities. Gradually increase the intensity and duration of the activities or exercise.  During periods of severe pain, bed rest may be helpful. Lay or sit in any position that is comfortable.  Putting ice on the injured area.  Put ice in a bag.  Place a towel between your skin and the bag.  Leave the ice on for 15 to 20 minutes, 3 to 4 times a day.  Follow up with your caregiver for continued problems and no reason can be found for the pain. If the pain becomes worse or does not go away, it may be  necessary to repeat tests or do additional testing. Your caregiver may need to look further for a possible cause. SEEK IMMEDIATE MEDICAL CARE IF:  You have pain that is getting worse and is not relieved by medications.  You develop chest pain that is associated with shortness or breath, sweating, feeling sick to your stomach (nauseous), or throw up (vomit).  Your pain becomes localized to the abdomen.  You develop any new symptoms that seem different or that concern you. MAKE SURE YOU:   Understand these instructions.  Will watch your condition.  Will get help right away if you are not doing well or get worse.   This information is not intended to replace advice given to you by your health care provider. Make sure you discuss any questions you have with your health care provider.   Document Released: 09/20/2005 Document Revised: 12/13/2011 Document Reviewed: 05/25/2013 Elsevier Interactive Patient Education [employer].
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
## 2015-10-27 NOTE — ED Notes (Signed)
Pt c/o L anterior thigh pain x 1 week. Pt sts she was here last night but LWBS because of the wait time. Pt denies injury. Pt denies deformity. Redness, swelling. Pt sts it hurts to put weight on L thigh or to move L leg. A&Ox4 and ambulatory with pain.

## 2015-10-27 NOTE — ED Notes (Signed)
Verbalized understanding discharge instructions. In no acute distress.   

## 2015-11-20 ENCOUNTER — Ambulatory Visit (INDEPENDENT_AMBULATORY_CARE_PROVIDER_SITE_OTHER): Payer: Self-pay | Admitting: Family Medicine

## 2015-11-20 ENCOUNTER — Encounter: Payer: Self-pay | Admitting: Family Medicine

## 2015-11-20 VITALS — BP 120/82 | HR 96 | Temp 98.1°F | Ht 65.0 in | Wt 157.0 lb

## 2015-11-20 DIAGNOSIS — M79605 Pain in left leg: Secondary | ICD-10-CM

## 2015-11-20 LAB — CBC
HCT: 42.4 % (ref 36.0–46.0)
Hemoglobin: 14.1 g/dL (ref 12.0–15.0)
MCH: 30.8 pg (ref 26.0–34.0)
MCHC: 33.3 g/dL (ref 30.0–36.0)
MCV: 92.6 fL (ref 78.0–100.0)
MPV: 10.4 fL (ref 8.6–12.4)
PLATELETS: 327 10*3/uL (ref 150–400)
RBC: 4.58 MIL/uL (ref 3.87–5.11)
RDW: 14.2 % (ref 11.5–15.5)
WBC: 10.5 10*3/uL (ref 4.0–10.5)

## 2015-11-20 MED ORDER — IBUPROFEN 800 MG PO TABS: 800.0000 mg | ORAL_TABLET | Freq: Three times a day (TID) | ORAL | Status: DC | PRN

## 2015-11-20 MED ORDER — CYCLOBENZAPRINE HCL 5 MG PO TABS: 5.0000 mg | ORAL_TABLET | Freq: Every evening | ORAL | Status: DC | PRN

## 2015-11-20 NOTE — Patient Instructions (Signed)
Your left leg pain is most likely due to muscle strain.  Recommend using heating pad, gentle stretching.  -   you can use Flexeril daily at bedtime as needed -  Continue to use ibuprofen 800 mg every 8 hours as needed -  Call if your pain has not improved in one week and we will discuss getting x-rays -  I have ordered some blood work today to evaluate and will notify you of the results

## 2015-11-21 ENCOUNTER — Encounter: Payer: Self-pay | Admitting: Family Medicine

## 2015-11-21 LAB — COMPLETE METABOLIC PANEL WITH GFR
ALT: 14 U/L (ref 6–29)
AST: 18 U/L (ref 10–35)
Albumin: 4 g/dL (ref 3.6–5.1)
Alkaline Phosphatase: 164 U/L — ABNORMAL HIGH (ref 33–130)
BILIRUBIN TOTAL: 0.2 mg/dL (ref 0.2–1.2)
BUN: 16 mg/dL (ref 7–25)
CO2: 25 mmol/L (ref 20–31)
CREATININE: 0.83 mg/dL (ref 0.50–1.05)
Calcium: 9.5 mg/dL (ref 8.6–10.4)
Chloride: 109 mmol/L (ref 98–110)
GFR, EST NON AFRICAN AMERICAN: 78 mL/min (ref >=60)
GFR, Est African American: >89 mL/min (ref >=60)
GLUCOSE: 76 mg/dL (ref 65–99)
Potassium: 4.4 mmol/L (ref 3.5–5.3)
SODIUM: 143 mmol/L (ref 135–146)
TOTAL PROTEIN: 6.6 g/dL (ref 6.1–8.1)

## 2015-11-21 LAB — CK: Total CK: 166 U/L (ref 7–177)
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
## 2015-11-24 NOTE — Addendum Note (Signed)
Addended by: Olam Idler on: 11/24/2015 01:44 PM   Modules accepted: Level of Service

## 2015-11-26 ENCOUNTER — Telehealth: Payer: Self-pay | Admitting: Family Medicine

## 2015-11-26 NOTE — Telephone Encounter (Signed)
FYI, I noticed in EPIC that patient's appointment at [facility] had been canceled. Called patient to see why appointment had been canceled, patient stated she is unable to afford co-pay at this time. Patient asked to have her labwork given to her from [place] 11/20/15. Please advise.

## 2015-11-26 NOTE — Telephone Encounter (Signed)
Will forward to MD advise on labs. Jazmin Hartsell,CMA

## 2015-11-28 ENCOUNTER — Encounter: Payer: Self-pay | Admitting: Physical Medicine & Rehabilitation

## 2015-12-04 ENCOUNTER — Other Ambulatory Visit: Payer: Self-pay | Admitting: Family Medicine

## 2016-02-11 ENCOUNTER — Encounter: Payer: Self-pay | Admitting: Family Medicine

## 2016-02-11 ENCOUNTER — Ambulatory Visit (INDEPENDENT_AMBULATORY_CARE_PROVIDER_SITE_OTHER): Payer: Self-pay | Admitting: Family Medicine

## 2016-02-11 VITALS — BP 133/77 | HR 89 | Temp 98.3°F | Wt 149.8 lb

## 2016-02-11 DIAGNOSIS — Z87440 Personal history of urinary (tract) infections: Secondary | ICD-10-CM

## 2016-02-11 DIAGNOSIS — N3000 Acute cystitis without hematuria: Secondary | ICD-10-CM

## 2016-02-11 LAB — POCT URINALYSIS DIPSTICK
BILIRUBIN UA: NEGATIVE
GLUCOSE UA: NEGATIVE
KETONES UA: NEGATIVE
Nitrite, UA: POSITIVE
SPEC GRAV UA: 1.02
Urobilinogen, UA: 0.2
pH, UA: 5.5

## 2016-02-11 LAB — POCT UA - MICROSCOPIC ONLY

## 2016-02-11 MED ORDER — CEPHALEXIN 500 MG PO CAPS: 500.0000 mg | ORAL_CAPSULE | Freq: Three times a day (TID) | ORAL | Status: DC

## 2016-02-11 NOTE — Patient Instructions (Signed)
Thanks for coming in today.  I'm not sure you have a urinary tract infection. Start taking keflex 3 times per day until we call you or at least 7 days. If you do not have evidence of an infection we will call you and have you stop the antibiotics.   We will send your urine specimen for culture to make sure the antibiotics are the right ones.    If you start having any worsening pain, vomiting, unable to hold down fluids, fevers, or other concerns, do not hesitate to call the clinic at [phone] or go to the ED if after hours.    Take care! - Dr. Bonner Puna

## 2016-02-11 NOTE — Progress Notes (Signed)
Subjective: Kim Ross is a 60 y.o. female presenting with urinary complaints.  She complains of urinary frequency, and nocturia. Urine character has also been bubbly with a strong odor, similar to previous UTI's. She denies feeling incomplete emptying, but has felt this in the past. She's had midthoracic back pain R > L for the past few weeks, worse with bending over and lifting (works as [employer]ar[employer] manager). She has a history of UTI's, culture proven E. coli in 2015 treated with keflex. Has had urology referral in past, and was told to take medication on an ongoing basis which she didn't want to do.  - ROS: Denies dysuria, abdominal pain, urinary retention, nausea or vomiting, fever, chills, or abnormal vaginal discharge or bleeding.  - PMH: No history of nephrolithiasis, instrumentation, immunocompromise, diabetes.  No history of STI's. - Everyday smoker  Objective: BP 133/77 mmHg  Pulse 89  Temp(Src) 98.3 F (36.8 C) (Oral)  Wt 149 lb 12.8 oz (67.949 kg) Gen:  60 y.o. female in no distress Abd: Soft, NTND, BS present, no suprapubic tenderness. No CVA tenderness. Skin: No rashes noted MSK: No signs of joint effusions. No edema.  Assessment & Plan: Kim Ross is a 61 y.o. female with recurrent urinary tract infection. - Keflex '500mg'$  TID x 7 days - Follow urine culture - Defer any laboratory work up (Cr, electrolytes, cultures). Consider only if not improving. - May use Pyridium OTC prn.  - Follow up with PCP for ongoing consideration of urology re-referral/ppx abx.

## 2016-02-13 LAB — URINE CULTURE: Colony Count: >=100000

## 2016-02-22 ENCOUNTER — Other Ambulatory Visit: Payer: Self-pay | Admitting: Family Medicine

## 2016-02-23 ENCOUNTER — Other Ambulatory Visit: Payer: Self-pay | Admitting: Family Medicine

## 2016-03-17 ENCOUNTER — Ambulatory Visit (HOSPITAL_COMMUNITY)
Admission: EM | Admit: 2016-03-17 | Discharge: 2016-03-17 | Disposition: A | Discharge status: Other Acute Inpatient Hospital | Payer: Medicare Other

## 2016-03-17 ENCOUNTER — Emergency Department (HOSPITAL_COMMUNITY)
Admission: EM | Admit: 2016-03-17 | Discharge: 2016-03-17 | Disposition: A | Discharge status: Home / Self Care | Payer: Medicare Other | Attending: Emergency Medicine | Admitting: Emergency Medicine

## 2016-03-17 ENCOUNTER — Encounter (HOSPITAL_COMMUNITY): Payer: Self-pay | Admitting: Family Medicine

## 2016-03-17 DIAGNOSIS — Z79899 Other long term (current) drug therapy: Secondary | ICD-10-CM | POA: Insufficient documentation

## 2016-03-17 DIAGNOSIS — Z791 Long term (current) use of non-steroidal anti-inflammatories (NSAID): Secondary | ICD-10-CM | POA: Insufficient documentation

## 2016-03-17 DIAGNOSIS — F17210 Nicotine dependence, cigarettes, uncomplicated: Secondary | ICD-10-CM | POA: Insufficient documentation

## 2016-03-17 DIAGNOSIS — B353 Tinea pedis: Secondary | ICD-10-CM

## 2016-03-17 DIAGNOSIS — L299 Pruritus, unspecified: Secondary | ICD-10-CM | POA: Insufficient documentation

## 2016-03-17 MED ORDER — ITRACONAZOLE 100 MG PO CAPS: 200.0000 mg | ORAL_CAPSULE | Freq: Two times a day (BID) | ORAL | Status: DC

## 2016-03-17 NOTE — ED Provider Notes (Signed)
CSN: [id number]     Arrival date & time 03/17/16  1355 History   First MD Initiated Contact with Patient 03/17/16 1539     Chief Complaint  Patient presents with  . Rash     (Consider location/radiation/quality/duration/timing/severity/associated sxs/prior Treatment) HPI   Kim Ross is a 60 y.o. female, patient with no pertinent past medical history, presenting to the ED with pruritic rash to the Left foot, present for the last 2 months. Has used ketoconazole cream without improvement. Patient adds that it may be spreading to her other foot as well. Patient denies pain, back pain, fever/chills, or any other complaints.     Past Medical History  Diagnosis Date  . History of recurrent UTIs   . Anxiety   . Depression    Past Surgical History  Procedure Laterality Date  . Abdominal hysterectomy    . Hemorrhoid surgery     Family History  Problem Relation Age of Onset  . Asthma Mother   . Cancer Sister 47    Pancreatic   Social History  Substance Use Topics  . Smoking status: Current Every Day Smoker -- 0.50 packs/day    Types: Cigarettes  . Smokeless tobacco: Never Used  . Alcohol Use: 0.6 oz/week    1 Cans of beer per week   OB History    No data available     Review of Systems  Constitutional: Negative for fever and chills.  Skin: Positive for rash.  Neurological: Negative for weakness and numbness.      Allergies  Trazodone and nefazodone  Home Medications   Prior to Admission medications   Medication Sig Start Date End Date Taking? Authorizing Provider  Butenafine HCl (LOTRIMIN ULTRA) 1 % cream Apply twice daily to affected area. 10/19/12   Cletus Gash, MD  cephALEXin (KEFLEX) 500 MG capsule Take 1 capsule (500 mg total) by mouth 3 (three) times daily. 02/11/16   Patrecia Pour, MD  cyclobenzaprine (FLEXERIL) 5 MG tablet Take 1 tablet (5 mg total) by mouth at bedtime as needed. Reported on 11/20/2015 11/20/15   Olam Idler, MD  gabapentin  (NEURONTIN) 100 MG capsule Take 1 capsule (100 mg total) by mouth 3 (three) times daily. 09/19/15   Olam Idler, MD  ibuprofen (ADVIL,MOTRIN) 800 MG tablet Take 1 tablet (800 mg total) by mouth every 8 (eight) hours as needed. 11/20/15   Olam Idler, MD  ketoconazole (NIZORAL) 2 % cream APPLY TOPICALLY DAILY FOR THE NEXT 4 WEEKS. 09/10/15   Olam Idler, MD  meclizine (ANTIVERT) 25 MG tablet TAKE 1 TABLET BY MOUTH TWICE A DAY 02/23/16   Olam Idler, MD  Multiple Vitamins-Minerals (MULTIVITAMIN WITH MINERALS) tablet Take 1 tablet by mouth daily. 04/27/13   Olam Idler, MD  sertraline (ZOLOFT) 50 MG tablet TAKE 1 TABLET BY MOUTH EVERY DAY 12/04/15   Olam Idler, MD  triamcinolone cream (KENALOG) 0.1 % Apply topically 2 (two) times daily. 06/27/13   Olam Idler, MD   BP 135/80 mmHg  Pulse 95  Temp(Src) 98.1 F (36.7 C) (Oral)  Resp 18  SpO2 99% Physical Exam  Constitutional: She appears well-developed and well-nourished. No distress.  HENT:  Head: Normocephalic and atraumatic.  Eyes: Conjunctivae are normal.  Neck: Neck supple.  Cardiovascular: Normal rate and regular rhythm.   Pulmonary/Chest: Effort normal.  Neurological: She is alert.  Skin: Skin is warm and dry. Rash noted. She is not diaphoretic.  Flat, irregular, scaling lesions  to the left superior, medial, and lateral foot.  Psychiatric: She has a normal mood and affect. Her behavior is normal.  Nursing note and vitals reviewed.   ED Course  Procedures (including critical care time)   MDM   Final diagnoses:  None    Kim Ross presents with a pruritic rash to her left foot the last 2 months.  Suspect tinea pedis or simply dry skin. Patient seems to have failed topical therapy. Oral therapy indicated. No signs of bacterial infection. Patient advised to follow-up with her PCP should symptoms fail to resolve. Return precautions discussed. Patient voiced understanding of these instructions and is comfortable  with discharge.  Filed Vitals:   03/17/16 1415 03/17/16 1616  BP: 135/80 135/80  Pulse: 95 92  Temp: 98.1 F (36.7 C) 98 F (36.7 C)  TempSrc: Oral Oral  Resp: 18 18  SpO2: 99% 100%       Lorayne Bender, PA-C 03/17/16 1730  Julianne Rice, MD 03/17/16 2234

## 2016-03-17 NOTE — Discharge Instructions (Signed)
You have been seen today for a foot rash. It appears to be a fungus called tinea pedis, but other rashes can appear in the same manner. Try the medication prescribed today. Take all of the medication as prescribed. Take it with food to avoid an upset stomach. Follow up with PCP as needed should symptoms continue. Return to ED should symptoms worsen.

## 2016-03-17 NOTE — ED Notes (Signed)
Pt here for rash to left arm and right foot x 2 months. sts using creams and not better.

## 2016-03-19 ENCOUNTER — Telehealth: Payer: Self-pay | Admitting: Family Medicine

## 2016-03-19 ENCOUNTER — Telehealth: Payer: Self-pay | Admitting: *Deleted

## 2016-03-19 DIAGNOSIS — B353 Tinea pedis: Secondary | ICD-10-CM

## 2016-03-19 MED ORDER — TERBINAFINE HCL 1 % EX CREA: 1.0000 "application " | TOPICAL_CREAM | Freq: Two times a day (BID) | CUTANEOUS | Status: DC

## 2016-03-19 NOTE — Telephone Encounter (Signed)
Prior Authorization received from [facility] for Itraconazole 100 mg.  PA form placed in provider box for completion. Derl Barrow, RN

## 2016-03-19 NOTE — Telephone Encounter (Signed)
Seen in ED and diagnosed with tinea pedis and prescribed oral itraconazole; however this is not covered by insurance.  - will treat with Topical Lamisil BID x 2 weeks and re-evaluate in clinic.

## 2016-03-19 NOTE — Telephone Encounter (Signed)
LM for patient that a new script was sent into pharmacy. Jada Kuhnert,CMA

## 2016-03-19 NOTE — Telephone Encounter (Signed)
Pt was seen at ED earlier in the week about her foot.  She was given a pill that would help but the pills cost $500.  She didn't know the name of the pi.  She would like a generic for this pill.  Also she has tried [employer] before for her foot but it is not working.. Please advise

## 2016-03-22 MED ORDER — TERBINAFINE HCL 250 MG PO TABS: 250.0000 mg | ORAL_TABLET | Freq: Every day | ORAL | Status: DC

## 2016-03-22 NOTE — Telephone Encounter (Signed)
Called patient. She reports using her husband's lamsil cream recently with little benefit.  - Prescribed Terbinafine '250mg'$  qd x 2 weeks, she will follow-up in the next two weeks to make sure pills are working and to make sure she doesn't have nail involvement and would require longer treatment.  - Discuss cleaning shower after each use, and always wearing socks with shoes.

## 2016-04-13 ENCOUNTER — Other Ambulatory Visit: Payer: Self-pay | Admitting: Family Medicine

## 2016-05-23 ENCOUNTER — Encounter (HOSPITAL_COMMUNITY): Payer: Self-pay | Admitting: Emergency Medicine

## 2016-05-23 ENCOUNTER — Emergency Department (HOSPITAL_COMMUNITY)
Admission: EM | Admit: 2016-05-23 | Discharge: 2016-05-23 | Disposition: A | Discharge status: Home / Self Care | Payer: Medicare Other | Attending: Emergency Medicine | Admitting: Emergency Medicine

## 2016-05-23 DIAGNOSIS — Z79899 Other long term (current) drug therapy: Secondary | ICD-10-CM | POA: Insufficient documentation

## 2016-05-23 DIAGNOSIS — F17210 Nicotine dependence, cigarettes, uncomplicated: Secondary | ICD-10-CM | POA: Insufficient documentation

## 2016-05-23 DIAGNOSIS — N390 Urinary tract infection, site not specified: Secondary | ICD-10-CM | POA: Insufficient documentation

## 2016-05-23 DIAGNOSIS — J069 Acute upper respiratory infection, unspecified: Secondary | ICD-10-CM

## 2016-05-23 LAB — URINE MICROSCOPIC-ADD ON

## 2016-05-23 LAB — URINALYSIS, ROUTINE W REFLEX MICROSCOPIC
BILIRUBIN URINE: NEGATIVE
Glucose, UA: NEGATIVE mg/dL
KETONES UR: NEGATIVE mg/dL
Nitrite: NEGATIVE
PH: 7 (ref 5.0–8.0)
Protein, ur: 30 mg/dL — AB
Specific Gravity, Urine: 1.022 (ref 1.005–1.030)

## 2016-05-23 MED ORDER — CEPHALEXIN 500 MG PO CAPS: 500.0000 mg | ORAL_CAPSULE | Freq: Four times a day (QID) | ORAL | 0 refills | Status: DC

## 2016-05-23 MED ORDER — MECLIZINE HCL 25 MG PO TABS
25.0000 mg | ORAL_TABLET | Freq: Once | ORAL | Status: AC
  Administered 2016-05-23: 25 mg via ORAL
  Filled 2016-05-23: qty 1

## 2016-05-23 MED ORDER — CETIRIZINE HCL 10 MG PO TABS: 10.0000 mg | ORAL_TABLET | Freq: Every day | ORAL | 1 refills | Status: DC

## 2016-05-23 MED ORDER — FLUTICASONE PROPIONATE 50 MCG/ACT NA SUSP: 2.0000 | Freq: Every day | NASAL | 1 refills | Status: DC

## 2016-05-23 NOTE — ED Triage Notes (Signed)
Pt began having ear pain and vertigo comes and goes about one month ago.

## 2016-05-23 NOTE — ED Provider Notes (Signed)
[place] DEPT Provider Note   CSN: [id number] Arrival date & time: 05/23/16  1644   By signing my name below, I, Estanislado Pandy, attest that this documentation has been prepared under the direction and in the presence of Waynetta Pean, PA-C. Electronically Signed: Estanislado Pandy, Scribe. 05/23/2016. 5:13 PM.   History   Chief Complaint Chief Complaint  Patient presents with  . Otalgia  . Dizziness  . Urinary Tract Infection    The history is provided by the patient. No language interpreter was used.   HPI Comments:  Kim Ross is a 60 y.o. female with a PMHx of vertigo and UTIs and a PSHx of hysterectomy who presents to the Emergency Department complaining of sudden onset, constant left ear pain. Pt complains of mild room spinning and dizziness. She feels this is her vertigo. She reports associated sneezing, nasal congestion, post nasal drip. Pt also complains that her urine "is heavy." She thinks she has a UTI. She denies dysuria.  Pt denies fevers, vomiting, diarrhea, headaches, double vision, fever, new back pain, numbness, tingling, weakness, syncope, chest pain, SOB, rashes, vaginal bleeding or vaginal discharge.     Past Medical History:  Diagnosis Date  . Anxiety   . Depression   . History of recurrent UTIs     Patient Active Problem List   Diagnosis Date Noted  . Left leg pain 11/20/2015  . Bilateral leg pain 09/19/2015  . Incontinence of urine in female 08/08/2015  . Tinea pedis of right foot 07/24/2015  . Hematuria 04/01/2015  . History of recurrent UTIs 01/31/2014  . Rash and nonspecific skin eruption 06/27/2013  . Vertigo, intermittent 06/27/2013  . Generalized anxiety disorder 04/10/2013  . Urinary frequency 04/10/2013  . Irritable bowel syndrome 10/19/2012  . Rectal bleeding 09/22/2012  . 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 Tobacco abuse 07/20/2012    Past Surgical History:  Procedure Laterality Date  . ABDOMINAL HYSTERECTOMY    . HEMORRHOID  SURGERY      OB History    No data available       Home Medications    Prior to Admission medications   Medication Sig Start Date End Date Taking? Authorizing Provider  Butenafine HCl (LOTRIMIN ULTRA) 1 % cream Apply twice daily to affected area. 10/19/12   Cletus Gash, MD  cephALEXin (KEFLEX) 500 MG capsule Take 1 capsule (500 mg total) by mouth 4 (four) times daily. 05/23/16   Waynetta Pean, PA-C  cetirizine (ZYRTEC ALLERGY) 10 MG tablet Take 1 tablet (10 mg total) by mouth daily. 05/23/16   Waynetta Pean, PA-C  [facility] ATHLETES FOOT 1 % cream APPLY TOPICALLY TWICE A DAY FOR 2 WEEKS 04/14/16   Vivi Barrack, MD  cyclobenzaprine (FLEXERIL) 5 MG tablet Take 1 tablet (5 mg total) by mouth at bedtime as needed. Reported on 11/20/2015 11/20/15   Olam Idler, MD  fluticasone [facility]) 50 MCG/ACT nasal spray Place 2 sprays into both nostrils daily. 05/23/16   Waynetta Pean, PA-C  gabapentin (NEURONTIN) 100 MG capsule Take 1 capsule (100 mg total) by mouth 3 (three) times daily. 09/19/15   Olam Idler, MD  ibuprofen (ADVIL,MOTRIN) 800 MG tablet Take 1 tablet (800 mg total) by mouth every 8 (eight) hours as needed. 11/20/15   Olam Idler, MD  ketoconazole (NIZORAL) 2 % cream APPLY TOPICALLY DAILY FOR THE NEXT 4 WEEKS. 09/10/15   Olam Idler, MD  meclizine (ANTIVERT) 25 MG tablet TAKE 1 TABLET BY MOUTH TWICE A DAY 02/23/16  Olam Idler, MD  Multiple Vitamins-Minerals (MULTIVITAMIN WITH MINERALS) tablet Take 1 tablet by mouth daily. 04/27/13   Olam Idler, MD  sertraline (ZOLOFT) 50 MG tablet TAKE 1 TABLET BY MOUTH EVERY DAY 12/04/15   Olam Idler, MD  terbinafine (LAMISIL) 250 MG tablet Take 1 tablet (250 mg total) by mouth daily. 03/22/16   Olam Idler, MD  triamcinolone cream (KENALOG) 0.1 % Apply topically 2 (two) times daily. 06/27/13   Olam Idler, MD    Family History Family History  Problem Relation Age of Onset  . Asthma Mother   . Cancer Sister 73    Pancreatic      Social History Social History  Substance Use Topics  . Smoking status: Current Every Day Smoker    Packs/day: 0.50    Types: Cigarettes  . Smokeless tobacco: Never Used  . Alcohol use 0.6 oz/week    1 Cans of beer per week     Allergies   Trazodone and nefazodone   Review of Systems Review of Systems  Constitutional: Negative for chills and fever.  HENT: Positive for ear pain. Negative for congestion and sore throat.   Eyes: Negative for pain and visual disturbance.  Respiratory: Negative for cough, shortness of breath and wheezing.   Cardiovascular: Negative for chest pain and palpitations.  Gastrointestinal: Negative for abdominal pain, diarrhea, nausea and vomiting.  Genitourinary: Negative for decreased urine volume, difficulty urinating, dysuria, flank pain, frequency, urgency, vaginal bleeding and vaginal discharge.       Urine "heavy and dark"   Musculoskeletal: Negative for back pain, gait problem and neck pain.  Skin: Negative for rash.  Neurological: Negative for dizziness, syncope, weakness, light-headedness, numbness and headaches.     Physical Exam Updated Vital Signs BP 132/80 (BP Location: Right Arm)   Pulse 81   Temp 98.3 F (36.8 C) (Oral)   Resp 16   Ht '5\' 5"'$  (1.651 m)   Wt 68 kg   SpO2 99%   BMI 24.96 kg/m   Physical Exam  Constitutional: She is oriented to person, place, and time. She appears well-developed and well-nourished. No distress.  Nontoxic appearing.  HENT:  Head: Normocephalic and atraumatic.  Right Ear: External ear normal.  Left Ear: External ear normal.  Mouth/Throat: Oropharynx is clear and moist.  Mild middle ear effusion bilaterally. No TM erythema or loss of landmarks. Throat is clear.   Eyes: Conjunctivae and EOM are normal. Pupils are equal, round, and reactive to light. Right eye exhibits no discharge. Left eye exhibits no discharge.  Neck: Normal range of motion. Neck supple. No JVD present. No tracheal deviation  present.  Cardiovascular: Normal rate, regular rhythm, normal heart sounds and intact distal pulses.   Pulmonary/Chest: Effort normal and breath sounds normal. No stridor. No respiratory distress. She has no wheezes. She has no rales.  Abdominal: Soft. There is no tenderness. There is no guarding.  Musculoskeletal: Normal range of motion. She exhibits no tenderness.  Normal gait.   Lymphadenopathy:    She has no cervical adenopathy.  Neurological: She is alert and oriented to person, place, and time. No cranial nerve deficit. Coordination normal.  The patient is alert 3. Cranial nerves are intact. Speech is clear and coherent. EOMs are intact. No nystagmus. No pronator. Finger-to-nose intact bilaterally. Normal gait. Sensation is intact in her bilateral upper and lower extremities.  Skin: Skin is warm and dry. Capillary refill takes less than 2 seconds. No rash noted. She is  not diaphoretic. No erythema. No pallor.  Psychiatric: She has a normal mood and affect. Her behavior is normal.  Nursing note and vitals reviewed.    ED Treatments / Results  DIAGNOSTIC STUDIES:  Oxygen Saturation is 100% on RA, normal by my interpretation.    COORDINATION OF CARE:  5:25 PM Will order urinalysis. Discussed treatment plan with pt at bedside and pt agreed to plan.  Labs (all labs ordered are listed, but only abnormal results are displayed) Labs Reviewed  URINALYSIS, ROUTINE W REFLEX MICROSCOPIC (NOT AT [facility]) - Abnormal; Notable for the following:       Result Value   APPearance CLOUDY (*)    Hgb urine dipstick LARGE (*)    Protein, ur 30 (*)    Leukocytes, UA MODERATE (*)    All other components within normal limits  URINE MICROSCOPIC-ADD ON - Abnormal; Notable for the following:    Squamous Epithelial / LPF 0-5 (*)    Bacteria, UA MANY (*)    All other components within normal limits  URINE CULTURE    EKG  EKG Interpretation None       Radiology No results  found.  Procedures Procedures (including critical care time)  Medications Ordered in ED Medications  meclizine (ANTIVERT) tablet 25 mg (25 mg Oral Given 05/23/16 1739)     Initial Impression / Assessment and Plan / ED Course  I have reviewed the triage vital signs and the nursing notes.  Pertinent labs & imaging results that were available during my care of the patient were reviewed by me and considered in my medical decision making (see chart for details).  Clinical Course   This is a 60 y.o. female with a PMHx of vertigo and UTIs and a PSHx of hysterectomy who presents to the Emergency Department complaining of sudden onset, constant left ear pain. Pt complains of mild room spinning and dizziness. She feels this is her vertigo. She reports associated sneezing, nasal congestion, post nasal drip. Pt also complains that her urine "is heavy." She thinks she has a UTI. She denies dysuria.  Pt denies fevers, vomiting, diarrhea.  On exam the patient is afebrile and nontoxic appearing. She is alert and oriented. No focal neurological deficits. His mild middle ear effusion noted bilaterally. She is boggy nasal turbinates bilaterally. Suspect upper respiratory infection. Patient has meclizine at home and has taken this for vertigo. She describes is a very mild dizziness. I provided her with an additional dose of meclizine. Nares and Zyrtec. Patient also has evidence of urinary tract infection. Urine sent for culture. Will start on Keflex and have her follow up closely with her primary care doctor and urologist. I discussed return precautions. I advised the patient to follow-up with their primary care provider this week. I advised the patient to return to the emergency department with new or worsening symptoms or new concerns. The patient verbalized understanding and agreement with plan.    I personally performed the services described in this documentation, which was scribed in my presence. The recorded  information has been reviewed and is accurate.     Final Clinical Impressions(s) / ED Diagnoses   Final diagnoses:  UTI (lower urinary tract infection)  URI (upper respiratory infection)    New Prescriptions Discharge Medication List as of 05/23/2016  6:27 PM    START taking these medications   Details  cetirizine (ZYRTEC ALLERGY) 10 MG tablet Take 1 tablet (10 mg total) by mouth daily., Starting Sun 05/23/2016, Print  fluticasone (FLONASE) 50 MCG/ACT nasal spray Place 2 sprays into both nostrils daily., Starting Sun 05/23/2016, Print           Waynetta Pean, PA-C 05/23/16 [place], MD 05/24/16 [phone]

## 2016-05-26 LAB — URINE CULTURE: Culture: >=100000 — AB

## 2016-05-27 ENCOUNTER — Telehealth (HOSPITAL_BASED_OUTPATIENT_CLINIC_OR_DEPARTMENT_OTHER): Payer: Self-pay | Admitting: Emergency Medicine

## 2016-05-27 NOTE — Telephone Encounter (Signed)
Post ED Visit - Positive Culture Follow-up  Culture report reviewed by antimicrobial stewardship pharmacist:  '[]'$  Elenor Quinones, Pharm.D. '[]'$  Heide Guile, Pharm.D., BCPS '[]'$  Parks Neptune, Pharm.D. '[]'$  Alycia Rossetti, Pharm.D., BCPS '[]'$  [place], Pharm.D., BCPS, AAHIVP '[]'$  Legrand Como, Pharm.D., BCPS, AAHIVP '[]'$  Milus Glazier, Pharm.D. '[]'$  Stephens November, Pharm.D. Andria Meuse RPh  Positive urine culture Treated with cephalexin, organism sensitive to the same and no further patient follow-up is required at this time.  Hazle Nordmann 05/27/2016, 9:29 AM

## 2016-07-02 ENCOUNTER — Other Ambulatory Visit: Payer: Self-pay | Admitting: Family Medicine

## 2016-08-08 ENCOUNTER — Encounter (HOSPITAL_COMMUNITY): Payer: Self-pay | Admitting: Emergency Medicine

## 2016-08-08 ENCOUNTER — Emergency Department (HOSPITAL_COMMUNITY)
Admission: EM | Admit: 2016-08-08 | Discharge: 2016-08-08 | Disposition: A | Discharge status: Home / Self Care | Payer: Medicare Other | Attending: Physician Assistant | Admitting: Physician Assistant

## 2016-08-08 DIAGNOSIS — J310 Chronic rhinitis: Secondary | ICD-10-CM

## 2016-08-08 DIAGNOSIS — Q309 Congenital malformation of nose, unspecified: Secondary | ICD-10-CM

## 2016-08-08 DIAGNOSIS — J342 Deviated nasal septum: Secondary | ICD-10-CM | POA: Insufficient documentation

## 2016-08-08 DIAGNOSIS — Z79899 Other long term (current) drug therapy: Secondary | ICD-10-CM | POA: Insufficient documentation

## 2016-08-08 DIAGNOSIS — H9202 Otalgia, left ear: Secondary | ICD-10-CM

## 2016-08-08 DIAGNOSIS — F17210 Nicotine dependence, cigarettes, uncomplicated: Secondary | ICD-10-CM | POA: Insufficient documentation

## 2016-08-08 MED ORDER — CIPROFLOXACIN-DEXAMETHASONE 0.3-0.1 % OT SUSP
4.0000 [drp] | Freq: Two times a day (BID) | OTIC | Status: AC
  Administered 2016-08-08: 4 [drp] via OTIC
  Filled 2016-08-08: qty 7.5

## 2016-08-08 MED ORDER — SALINE SPRAY 0.65 % NA SOLN: 1.0000 | NASAL | 0 refills | Status: DC | PRN

## 2016-08-08 MED ORDER — CETIRIZINE HCL 10 MG PO TABS: 10.0000 mg | ORAL_TABLET | Freq: Every day | ORAL | 1 refills | Status: DC

## 2016-08-08 NOTE — ED Provider Notes (Signed)
[place] DEPT Provider Note   CSN: [id number] Arrival date & time: 08/08/16  4098     History   Chief Complaint Chief Complaint  Patient presents with  . Otalgia  . Generalized Body Aches    HPI Kim Ross is a 60 y.o. female.  HPI   Pt presents to the ER complaining of several months of left external ear swelling with intermittent tenderness, asymmetry of ear and left jaw, and left inner ear pain and itchiness x 2-3 days.  She reports hx of recurrent ear problems, endorses frequent ear cleaning and instrumentation. Pain is described as throbbing, aching and severe, rated 8/10, worse with palpation, no alleviating factors. She denies hearing loss, ear drainage, fever, URI sx.  No other associated sx.  Past Medical History:  Diagnosis Date  . Anxiety   . Depression   . History of recurrent UTIs     Patient Active Problem List   Diagnosis Date Noted  . Left leg pain 11/20/2015  . Bilateral leg pain 09/19/2015  . Incontinence of urine in female 08/08/2015  . Tinea pedis of right foot 07/24/2015  . Hematuria 04/01/2015  . History of recurrent UTIs 01/31/2014  . Rash and nonspecific skin eruption 06/27/2013  . Vertigo, intermittent 06/27/2013  . Generalized anxiety disorder 04/10/2013  . Urinary frequency 04/10/2013  . Irritable bowel syndrome 10/19/2012  . Rectal bleeding 09/22/2012  . 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 Tobacco abuse 07/20/2012    Past Surgical History:  Procedure Laterality Date  . ABDOMINAL HYSTERECTOMY    . HEMORRHOID SURGERY      OB History    No data available       Home Medications    Prior to Admission medications   Medication Sig Start Date End Date Taking? Authorizing Provider  Butenafine HCl (LOTRIMIN ULTRA) 1 % cream Apply twice daily to affected area. 10/19/12   Cletus Gash, MD  cephALEXin (KEFLEX) 500 MG capsule Take 1 capsule (500 mg total) by mouth 4 (four) times daily. 05/23/16   Waynetta Pean, PA-C  cetirizine  (ZYRTEC ALLERGY) 10 MG tablet Take 1 tablet (10 mg total) by mouth daily. 05/23/16   Waynetta Pean, PA-C  [facility] ATHLETES FOOT 1 % cream APPLY TOPICALLY TWICE A DAY FOR 2 WEEKS 04/14/16   Vivi Barrack, MD  cyclobenzaprine (FLEXERIL) 5 MG tablet Take 1 tablet (5 mg total) by mouth at bedtime as needed. Reported on 11/20/2015 11/20/15   Olam Idler, MD  fluticasone [facility]) 50 MCG/ACT nasal spray Place 2 sprays into both nostrils daily. 05/23/16   Waynetta Pean, PA-C  gabapentin (NEURONTIN) 100 MG capsule Take 1 capsule (100 mg total) by mouth 3 (three) times daily. 09/19/15   Olam Idler, MD  ibuprofen (ADVIL,MOTRIN) 800 MG tablet Take 1 tablet (800 mg total) by mouth every 8 (eight) hours as needed. 11/20/15   Olam Idler, MD  ketoconazole (NIZORAL) 2 % cream APPLY TOPICALLY DAILY FOR THE NEXT 4 WEEKS. 09/10/15   Olam Idler, MD  meclizine (ANTIVERT) 25 MG tablet TAKE 1 TABLET BY MOUTH TWICE A DAY 07/02/16   Vivi Barrack, MD  Multiple Vitamins-Minerals (MULTIVITAMIN WITH MINERALS) tablet Take 1 tablet by mouth daily. 04/27/13   Olam Idler, MD  sertraline (ZOLOFT) 50 MG tablet TAKE 1 TABLET BY MOUTH EVERY DAY 12/04/15   Olam Idler, MD  terbinafine (LAMISIL) 250 MG tablet Take 1 tablet (250 mg total) by mouth daily. 03/22/16   Olam Idler,  MD  triamcinolone cream (KENALOG) 0.1 % Apply topically 2 (two) times daily. 06/27/13   Olam Idler, MD    Family History Family History  Problem Relation Age of Onset  . Asthma Mother   . Cancer Sister 67    Pancreatic    Social History Social History  Substance Use Topics  . Smoking status: Current Every Day Smoker    Packs/day: 0.50    Types: Cigarettes  . Smokeless tobacco: Never Used  . Alcohol use 0.6 oz/week    1 Cans of beer per week     Allergies   Trazodone and nefazodone   Review of Systems Review of Systems  All other systems reviewed and are negative.    Physical Exam Updated Vital Signs BP 126/83   Pulse  93   Temp 98.1 F (36.7 C) (Oral)   Resp 16   SpO2 96%   Physical Exam  Constitutional: She is oriented to person, place, and time. She appears well-developed and well-nourished. No distress.  HENT:  Head: Normocephalic and atraumatic.  Right Ear: Hearing, tympanic membrane and external ear normal.  Left Ear: Hearing and tympanic membrane normal. No drainage or swelling. No foreign bodies.  No middle ear effusion.  Nose: Rhinorrhea present. Right sinus exhibits no maxillary sinus tenderness and no frontal sinus tenderness. Left sinus exhibits no maxillary sinus tenderness and no frontal sinus tenderness.  Mouth/Throat: Oropharynx is clear and moist. No oropharyngeal exudate.  Nasal mucosal erythema with nasal septum perforation Left tragus mildly larger than right, ttp No mastoid tenderness or erythema No pre-or post-auricular lymphadenopathy   Eyes: Conjunctivae and EOM are normal. Pupils are equal, round, and reactive to light. Right eye exhibits no discharge. Left eye exhibits no discharge. No scleral icterus.  Neck: Normal range of motion. Neck supple. No JVD present. No tracheal deviation present.  Cardiovascular: Normal rate and regular rhythm.   Pulmonary/Chest: Effort normal and breath sounds normal. No stridor. No respiratory distress.  Musculoskeletal: Normal range of motion. She exhibits no edema.  Lymphadenopathy:    She has no cervical adenopathy.  Neurological: She is alert and oriented to person, place, and time. She exhibits normal muscle tone. Coordination normal.  Skin: Skin is warm and dry. No rash noted. She is not diaphoretic. No erythema. No pallor.  Psychiatric: She has a normal mood and affect. Her behavior is normal. Judgment and thought content normal.  Nursing note and vitals reviewed.    ED Treatments / Results  Labs (all labs ordered are listed, but only abnormal results are displayed) Labs Reviewed - No data to display  EKG  EKG  Interpretation None       Radiology No results found.  Procedures Procedures (including critical care time)  Medications Ordered in ED Medications - No data to display   Initial Impression / Assessment and Plan / ED Course  I have reviewed the triage vital signs and the nursing notes.  Pertinent labs & imaging results that were available during my care of the patient were reviewed by me and considered in my medical decision making (see chart for details).  Clinical Course    Pt with CC of left external ear pain, ongoing for several months, mild visible asymmetry of L>R tragus, on exam left external ear is tender, no external auditory canal edema or exudate, TM's bilaterally normal in appearance. Pt has rhinorhea, perforated nasal septum, hx of cocaine use, tissue atrophied.  Unclear etiology of pt's discomfort, encouraged saline nasal spray  for nasal sx, no ear pathology identified to treat, sx may be secondary to frequent instrumentation of ears and cleaning, will cover for 2-3 days with ciprodex.  Pt was instructed to refrain from sticking anything in her ear.  Pt well appearing, safe to d/c home, ENT follow up as needed.  Final Clinical Impressions(s) / ED Diagnoses   Final diagnoses:  Abnormal nasal septum  Left ear pain  Other chronic rhinitis    New Prescriptions Discharge Medication List as of 08/08/2016 11:58 AM    START taking these medications   Details  sodium chloride (OCEAN) 0.65 % SOLN nasal spray Place 1 spray into both nostrils as needed for congestion., Starting Sun 08/08/2016, Print         Delsa Grana, PA-C 08/28/16 0206    Courteney Julio Alm, MD 09/02/16 2355

## 2016-08-08 NOTE — ED Triage Notes (Signed)
Pt reports pain and swelling just below L ear along with generalized body aches. No difficulty hearing.

## 2016-08-18 ENCOUNTER — Other Ambulatory Visit: Payer: Self-pay | Admitting: Family Medicine

## 2016-08-19 ENCOUNTER — Encounter: Payer: Self-pay | Admitting: *Deleted

## 2016-08-19 NOTE — Telephone Encounter (Signed)
Letter mailed to patient to call our office to schedule a follow up. Kim Ross,CMA

## 2016-09-13 ENCOUNTER — Other Ambulatory Visit: Payer: Self-pay | Admitting: Family Medicine

## 2016-09-13 NOTE — Telephone Encounter (Signed)
Rx filled. Patient needs office visit.  Kim Ross. Jerline Pain, [facility] Resident PGY-3 09/13/2016 9:01 AM

## 2016-09-14 NOTE — Telephone Encounter (Signed)
Patient has an appointment on 09/30/16. Kim Ross,CMA

## 2016-09-30 ENCOUNTER — Ambulatory Visit (INDEPENDENT_AMBULATORY_CARE_PROVIDER_SITE_OTHER): Payer: Self-pay | Admitting: Family Medicine

## 2016-09-30 ENCOUNTER — Encounter: Payer: Self-pay | Admitting: Family Medicine

## 2016-09-30 VITALS — BP 118/80 | HR 94 | Temp 97.9°F | Ht 65.0 in | Wt 154.0 lb

## 2016-09-30 DIAGNOSIS — Z23 Encounter for immunization: Secondary | ICD-10-CM

## 2016-09-30 DIAGNOSIS — F411 Generalized anxiety disorder: Secondary | ICD-10-CM

## 2016-09-30 DIAGNOSIS — R350 Frequency of micturition: Secondary | ICD-10-CM

## 2016-09-30 DIAGNOSIS — Z1159 Encounter for screening for other viral diseases: Secondary | ICD-10-CM

## 2016-09-30 DIAGNOSIS — Z720 Tobacco use: Secondary | ICD-10-CM

## 2016-09-30 DIAGNOSIS — Z1211 Encounter for screening for malignant neoplasm of colon: Secondary | ICD-10-CM

## 2016-09-30 DIAGNOSIS — Z114 Encounter for screening for human immunodeficiency virus [HIV]: Secondary | ICD-10-CM

## 2016-09-30 DIAGNOSIS — R42 Dizziness and giddiness: Secondary | ICD-10-CM

## 2016-09-30 DIAGNOSIS — Z0000 Encounter for general adult medical examination without abnormal findings: Secondary | ICD-10-CM

## 2016-09-30 DIAGNOSIS — Z79899 Other long term (current) drug therapy: Secondary | ICD-10-CM

## 2016-09-30 LAB — POCT URINALYSIS DIPSTICK
Bilirubin, UA: NEGATIVE
Glucose, UA: NEGATIVE
Ketones, UA: NEGATIVE
LEUKOCYTES UA: NEGATIVE
Nitrite, UA: NEGATIVE
PROTEIN UA: NEGATIVE
Spec Grav, UA: 1.02
UROBILINOGEN UA: 0.2
pH, UA: 5.5

## 2016-09-30 LAB — CBC
HEMATOCRIT: 42.9 % (ref 35.0–45.0)
Hemoglobin: 14 g/dL (ref 11.7–15.5)
MCH: 29.6 pg (ref 27.0–33.0)
MCHC: 32.6 g/dL (ref 32.0–36.0)
MCV: 90.7 fL (ref 80.0–100.0)
MPV: 9.9 fL (ref 7.5–12.5)
Platelets: 340 10*3/uL (ref 140–400)
RBC: 4.73 MIL/uL (ref 3.80–5.10)
RDW: 14.2 % (ref 11.0–15.0)
WBC: 10 10*3/uL (ref 3.8–10.8)

## 2016-09-30 LAB — TSH: TSH: 0.85 mIU/L

## 2016-09-30 NOTE — Patient Instructions (Addendum)
We will check blood work today. We will check your urine today.  Please use the hemoccult cards.  Come back to talk to our behavioral health specialist.  Come back to see me in 3-6 months, or sooner if you need anything else.  Take care,  Dr Jerline Pain

## 2016-09-30 NOTE — Assessment & Plan Note (Signed)
Check CBC, CMET, HIV, and Hep C today. Gave hemoccult cards. Flu shot given today.

## 2016-09-30 NOTE — Progress Notes (Signed)
    Subjective:  Kim Ross is a 60 y.o. female who presents to the [facility] today with a chief complaint of grief.   HPI:  Grief Reaction Patient reports that she lost her sister last month to cancer. Says that it has been particularly tough on her because she was close with her sister and now feels like she does not have much support. She has been talking to her sister's husband which has been somewhat helpful. No SI or HI. Patient is not interested in speaking with integrated care specialist today, though would be interested in coming back.  Urinary Frequency Patient with a history of frequent UTIs and has noticed increased frequency lately. No dysuria. No fevers or chills.   HCM Due for HIV and Hep C screening. Due for colon cancer screening.   ROS: Per HPI  PMH: Smoking history reviewed.   Objective:  Physical Exam: BP 118/80   Pulse 94   Temp 97.9 F (36.6 C) (Oral)   Ht '5\' 5"'$  (1.651 m)   Wt 154 lb (69.9 kg)   SpO2 99%   BMI 25.63 kg/m   Gen: NAD, resting comfortably CV: RRR with no murmurs appreciated Pulm: NWOB, CTAB with no crackles, wheezes, or rhonchi GI: Normal bowel sounds present. Soft, Nontender, Nondistended. MSK: no edema, cyanosis, or clubbing noted Skin: warm, dry Neuro: grossly normal, moves all extremities Psych: Normal affect and thought content  Results for orders placed or performed in visit on 09/30/16 (from the past 72 hour(s))  POCT urinalysis dipstick     Status: Abnormal   Collection Time: 09/30/16  2:38 PM  Result Value Ref Range   Color, UA YELLOW    Clarity, UA CLEAR    Glucose, UA NEG    Bilirubin, UA NEG    Ketones, UA NEG    Spec Grav, UA 1.020    Blood, UA MODERATE    pH, UA 5.5    Protein, UA NEG    Urobilinogen, UA 0.2    Nitrite, UA NEG    Leukocytes, UA Negative Negative     Assessment/Plan:  Urinary frequency UA negative for UTI though did have moderate blood. On chart review, it appears that patient has had several  consequtive UAs with RBC and/or blood. Has hematuria listed on problem list and she has been referred to urology in the past, but it is not clear if this has been fully worked up. Will discuss with patient. If has not had full work up, will need referral to urology.   Healthcare maintenance Check CBC, CMET, HIV, and Hep C today. Gave hemoccult cards. Flu shot given today.   Grief Reaction Offered integrated care, however patient deferred. No SI or HI. Stated that she would come back next week to talk with them.   Algis Greenhouse. Jerline Pain, [place] [facility] Resident PGY-3 09/30/2016 4:19 PM

## 2016-09-30 NOTE — Assessment & Plan Note (Addendum)
UA negative for UTI though did have moderate blood. On chart review, it appears that patient has had several consequtive UAs with RBC and/or blood. Has hematuria listed on problem list and she has been referred to urology in the past, but it is not clear if this has been fully worked up. Will discuss with patient. If has not had full work up, will need referral to urology.

## 2016-10-01 ENCOUNTER — Telehealth: Payer: Self-pay | Admitting: Family Medicine

## 2016-10-01 LAB — COMPLETE METABOLIC PANEL WITH GFR
ALBUMIN: 3.7 g/dL (ref 3.6–5.1)
ALK PHOS: 135 U/L — AB (ref 33–130)
ALT: 9 U/L (ref 6–29)
AST: 14 U/L (ref 10–35)
BUN: 13 mg/dL (ref 7–25)
CALCIUM: 8.9 mg/dL (ref 8.6–10.4)
CO2: 26 mmol/L (ref 20–31)
Chloride: 108 mmol/L (ref 98–110)
Creat: 0.81 mg/dL (ref 0.50–1.05)
GFR, Est Non African American: 80 mL/min (ref >=60)
Glucose, Bld: 80 mg/dL (ref 65–99)
POTASSIUM: 4.5 mmol/L (ref 3.5–5.3)
Sodium: 142 mmol/L (ref 135–146)
Total Bilirubin: 0.3 mg/dL (ref 0.2–1.2)
Total Protein: 6.6 g/dL (ref 6.1–8.1)

## 2016-10-01 LAB — HEPATITIS C ANTIBODY: HCV Ab: NEGATIVE

## 2016-10-01 LAB — HIV ANTIBODY (ROUTINE TESTING W REFLEX): HIV 1&2 Ab, 4th Generation: NONREACTIVE

## 2016-10-01 LAB — LIPID PANEL
CHOL/HDL RATIO: 4.3 ratio (ref <5.0)
CHOLESTEROL: 183 mg/dL (ref <200)
HDL: 43 mg/dL — AB (ref >50)
LDL Cholesterol: 108 mg/dL — ABNORMAL HIGH (ref <100)
TRIGLYCERIDES: 162 mg/dL — AB (ref <150)
VLDL: 32 mg/dL — ABNORMAL HIGH (ref <30)

## 2016-10-01 NOTE — Telephone Encounter (Signed)
Attempted to call patient. No answer. Left VM with call back number.  Lab results significant for a mildly elevated cholesterol level. Based on her cholesterol levels, she has a 7.2% chance of having a heart attack or stroke within the next 10 years. If she is started on a cholesterol medication, that risk would be reduced to 5.4%. If she would like to discuss starting a cholesterol medication, she can schedule an appointment or I can try calling her again later.  She was also noted to have blood in her urine (which has been there previously). If she has not had this evaluated by urology in the past, she will need to see them again.  Algis Greenhouse. Jerline Pain, [place] Resident PGY-3 10/01/2016 1:40 PM

## 2016-10-06 ENCOUNTER — Telehealth: Payer: Self-pay | Admitting: Family Medicine

## 2016-10-06 MED ORDER — ATORVASTATIN CALCIUM 40 MG PO TABS: 40.0000 mg | ORAL_TABLET | Freq: Every day | ORAL | 3 refills | Status: DC

## 2016-10-06 NOTE — Telephone Encounter (Signed)
Can you let her know the stool cards were expired and that they had too much stool on them to be read? She can come back to pick up another set.  Algis Greenhouse. Jerline Pain, [place] Resident PGY-3 10/06/2016 1:55 PM

## 2016-10-06 NOTE — Telephone Encounter (Signed)
Discussed results with patient. Decided to start statin. Will send in atorvastatin '40mg'$  daily.   Patient unsure what urology had done with her hematuria. Asked patient to have those records sent to [place].  Algis Greenhouse. Jerline Pain, [place] Resident PGY-3 10/06/2016 11:12 AM

## 2016-10-06 NOTE — Telephone Encounter (Signed)
-----   Message from [place] Pink, [place] sent at 10/06/2016 12:54 PM EST ----- Regarding: stool cards The stool cards she turned in today were expired and were also too thick to read. She will need another set of cards and proper instructions on how to collect the sample

## 2016-10-07 NOTE — Telephone Encounter (Signed)
Spoke with pt and informed her to come by and get another stool card. Will leave up front for pt to pick up at her convenience.

## 2016-10-07 NOTE — Telephone Encounter (Signed)
Left message for pt to return my call.

## 2016-10-08 ENCOUNTER — Other Ambulatory Visit: Payer: Self-pay | Admitting: Family Medicine

## 2016-10-08 MED ORDER — CEPHALEXIN 500 MG PO CAPS: 500.0000 mg | ORAL_CAPSULE | Freq: Two times a day (BID) | ORAL | 0 refills | Status: AC

## 2016-10-08 NOTE — Telephone Encounter (Signed)
Pt states she has a UTI and was seen for this recently, but the doctor said she didn't have one, just some blood in urine. Pt states urine has a bad smell and its uncomfortable. [facility] [address]. Pt unable to come in today, works till 21. ep

## 2016-10-08 NOTE — Telephone Encounter (Signed)
Patient should make sure she is drinking plenty of fluids. Her urine should be a clear yellow color. I sent in an Rx for keflex if her urine does not improve. If it does not improve after antibiotics, she should be seen.  Algis Greenhouse. Jerline Pain, [place] Resident PGY-3 10/08/2016 1:54 PM

## 2016-10-08 NOTE — Telephone Encounter (Signed)
Pt informed of antibiotic sent into her pharmacy. Pt voiced understanding to call for an apt if sxs do not improve after she completes antibiotic course.

## 2016-12-15 ENCOUNTER — Other Ambulatory Visit: Payer: Self-pay | Admitting: Family Medicine

## 2017-02-09 ENCOUNTER — Encounter: Payer: Self-pay | Admitting: Family Medicine

## 2017-02-09 ENCOUNTER — Ambulatory Visit (INDEPENDENT_AMBULATORY_CARE_PROVIDER_SITE_OTHER): Payer: Self-pay | Admitting: Family Medicine

## 2017-02-09 VITALS — BP 122/82 | HR 96 | Temp 97.9°F | Wt 155.0 lb

## 2017-02-09 DIAGNOSIS — R300 Dysuria: Secondary | ICD-10-CM

## 2017-02-09 DIAGNOSIS — N390 Urinary tract infection, site not specified: Secondary | ICD-10-CM | POA: Insufficient documentation

## 2017-02-09 DIAGNOSIS — N3001 Acute cystitis with hematuria: Secondary | ICD-10-CM

## 2017-02-09 LAB — POCT URINALYSIS DIP (MANUAL ENTRY)
Bilirubin, UA: NEGATIVE
Glucose, UA: NEGATIVE mg/dL
Ketones, POC UA: NEGATIVE mg/dL
NITRITE UA: POSITIVE — AB
PH UA: 6 (ref 5.0–8.0)
Spec Grav, UA: 1.025 (ref 1.010–1.025)
Urobilinogen, UA: 1 E.U./dL

## 2017-02-09 LAB — POCT UA - MICROSCOPIC ONLY

## 2017-02-09 MED ORDER — CEPHALEXIN 500 MG PO CAPS: 500.0000 mg | ORAL_CAPSULE | Freq: Four times a day (QID) | ORAL | 0 refills | Status: DC

## 2017-02-09 NOTE — Progress Notes (Signed)
Subjective: CC: concerns for UTI HPI: Patient is a 61 y.o. female presenting to clinic today for concerns of UTI.  Patient presents concerns for UTI.  States her urine has strong smell (this typically represents a UTI for her).  No dysuria. Some urinary frequency.  No fevers, chills, abdominal pain, back pain, vulvar pruritus or vaginal discharge.  Not concerned about STDs  Social History: current smoker   ROS: All other systems reviewed and are negative.  Past Medical History Patient Active Problem List   Diagnosis Date Noted  . UTI (urinary tract infection) 02/09/2017  . Healthcare maintenance 09/30/2016  . Left leg pain 11/20/2015  . Bilateral leg pain 09/19/2015  . Incontinence of urine in female 08/08/2015  . Tinea pedis of right foot 07/24/2015  . Hematuria 04/01/2015  . History of recurrent UTIs 01/31/2014  . Rash and nonspecific skin eruption 06/27/2013  . Vertigo, intermittent 06/27/2013  . Generalized anxiety disorder 04/10/2013  . Urinary frequency 04/10/2013  . Irritable bowel syndrome 10/19/2012  . Rectal bleeding 09/22/2012  . 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 Tobacco abuse 07/20/2012    Medications- reviewed and updated Current Outpatient Prescriptions  Medication Sig Dispense Refill  . atorvastatin (LIPITOR) 40 MG tablet Take 1 tablet (40 mg total) by mouth daily. 90 tablet 3  . Butenafine HCl (LOTRIMIN ULTRA) 1 % cream Apply twice daily to affected area. 24 g 0  . cephALEXin (KEFLEX) 500 MG capsule Take 1 capsule (500 mg total) by mouth 4 (four) times daily. 20 capsule 0  . cetirizine (ZYRTEC ALLERGY) 10 MG tablet Take 1 tablet (10 mg total) by mouth daily. 30 tablet 1  . [facility] ATHLETES FOOT 1 % cream APPLY TOPICALLY TWICE A DAY FOR 2 WEEKS 48 g 0  . cyclobenzaprine (FLEXERIL) 5 MG tablet Take 1 tablet (5 mg total) by mouth at bedtime as needed. Reported on 11/20/2015 30 tablet 0  . fluticasone (FLONASE) 50 MCG/ACT nasal spray Place 2 sprays into both  nostrils daily. 16 g 1  . gabapentin (NEURONTIN) 100 MG capsule Take 1 capsule (100 mg total) by mouth 3 (three) times daily. 270 capsule 1  . ibuprofen (ADVIL,MOTRIN) 800 MG tablet Take 1 tablet (800 mg total) by mouth every 8 (eight) hours as needed. 30 tablet 0  . ketoconazole (NIZORAL) 2 % cream APPLY TOPICALLY DAILY FOR THE NEXT 4 WEEKS. 60 g 0  . meclizine (ANTIVERT) 25 MG tablet TAKE 1 TABLET BY MOUTH TWICE A DAY 60 tablet 1  . Multiple Vitamins-Minerals (MULTIVITAMIN WITH MINERALS) tablet Take 1 tablet by mouth daily. 1 tablet 11  . sertraline (ZOLOFT) 50 MG tablet TAKE 1 TABLET BY MOUTH EVERY DAY 90 tablet 3  . sodium chloride (OCEAN) 0.65 % SOLN nasal spray Place 1 spray into both nostrils as needed for congestion. 50 mL 0  . terbinafine (LAMISIL) 250 MG tablet Take 1 tablet (250 mg total) by mouth daily. 14 tablet 0  . triamcinolone cream (KENALOG) 0.1 % Apply topically 2 (two) times daily. 30 g 0   No current facility-administered medications for this visit.     Objective: Office vital signs reviewed. BP 122/82   Pulse 96   Temp 97.9 F (36.6 C) (Oral)   Wt 155 lb (70.3 kg)   BMI 25.79 kg/m    Physical Examination:  General: Awake, alert, well- nourished, NAD GI: +BS, soft, non-distended, non-tender. No CVA tenderness.  Results for orders placed or performed in visit on 02/09/17  POCT urinalysis dipstick  Result Value Ref Range   Color, UA yellow yellow   Clarity, UA cloudy (A) clear   Glucose, UA negative negative mg/dL   Bilirubin, UA negative negative   Ketones, POC UA negative negative mg/dL   Spec Grav, UA 1.025 1.010 - 1.025   Blood, UA moderate (A) negative   pH, UA 6.0 5.0 - 8.0   Protein Ur, POC =30 (A) negative mg/dL   Urobilinogen, UA 1.0 0.2 or 1.0 E.U./dL   Nitrite, UA Positive (A) Negative   Leukocytes, UA Moderate (2+) (A) Negative  POCT UA - Microscopic Only  Result Value Ref Range   WBC, Ur, HPF, POC TOO MANY TO COUNT    RBC, urine,  microscopic 0-3    Bacteria, U Microscopic 3+ RODS    Epithelial cells, urine per micros 1-5      Assessment/Plan: UTI (urinary tract infection) U/A consistent with UTI. Pt has been seen by [facility] in the past but we do not have these records. Well appearing. No systemic symptoms. No evidence of pyelonephritis. - Rx keflex  - hydration  - sent for urine culture  - return precautions discussed     Orders Placed This Encounter  Procedures  . Urine culture  . POCT urinalysis dipstick  . POCT UA - Microscopic Only    Meds ordered this encounter  Medications  . cephALEXin (KEFLEX) 500 MG capsule    Sig: Take 1 capsule (500 mg total) by mouth 4 (four) times daily.    Dispense:  20 capsule    Refill:  [place] PGY-3, [place]

## 2017-02-09 NOTE — Assessment & Plan Note (Signed)
U/A consistent with UTI. Pt has been seen by [facility] in the past but we do not have these records. Well appearing. No systemic symptoms. No evidence of pyelonephritis. - Rx keflex  - hydration  - sent for urine culture  - return precautions discussed

## 2017-02-09 NOTE — Patient Instructions (Signed)
Take keflex four times per day for 5 days total.  Try to get your old paperwork for Dr. Jerline Pain.  If your symptoms are not improving, please follow up with [place].

## 2017-02-11 LAB — URINE CULTURE

## 2017-02-21 ENCOUNTER — Other Ambulatory Visit: Payer: Self-pay | Admitting: Family Medicine

## 2017-03-16 ENCOUNTER — Telehealth: Payer: Self-pay | Admitting: Family Medicine

## 2017-03-16 NOTE — Telephone Encounter (Signed)
Will forward to MD who saw her to advise.  Since it has been almost a month she may need to be re-evaluated. Jazmin Hartsell,CMA

## 2017-03-16 NOTE — Telephone Encounter (Signed)
Looking at the urine culture results, the antibiotic should have cleared up her UTI.  If she is still having additional symptoms, she should be evaluated to see if something else is going on.  Thanks, Archie Patten, MD [facility] Resident  03/16/2017, 4:50 PM

## 2017-03-16 NOTE — Telephone Encounter (Signed)
Patient came by office & was seen on 02/09/17 for uti. Patient thinks this never really cleared up. Patient wants to know if something else may be called [facility] [address] Please let patient know, [phone]

## 2017-03-16 NOTE — Telephone Encounter (Signed)
2nd request. Pt declined appointment. ep

## 2017-03-17 ENCOUNTER — Telehealth: Payer: Self-pay | Admitting: Family Medicine

## 2017-03-17 NOTE — Telephone Encounter (Signed)
Pre-visit call. No answer, LMOVM. Patient was advised to arrive early for check-in and to bring all current medication.

## 2017-03-17 NOTE — Telephone Encounter (Signed)
Patient informed, SDA appointment scheduled with Gambino tomm.

## 2017-03-18 ENCOUNTER — Encounter: Payer: Self-pay | Admitting: Family Medicine

## 2017-03-18 ENCOUNTER — Ambulatory Visit (INDEPENDENT_AMBULATORY_CARE_PROVIDER_SITE_OTHER): Payer: Self-pay | Admitting: Family Medicine

## 2017-03-18 VITALS — BP 130/80 | HR 101 | Temp 97.9°F | Ht 65.0 in | Wt 158.6 lb

## 2017-03-18 DIAGNOSIS — R319 Hematuria, unspecified: Secondary | ICD-10-CM

## 2017-03-18 DIAGNOSIS — Z87440 Personal history of urinary (tract) infections: Secondary | ICD-10-CM

## 2017-03-18 DIAGNOSIS — R350 Frequency of micturition: Secondary | ICD-10-CM

## 2017-03-18 LAB — POCT URINALYSIS DIP (MANUAL ENTRY)
BILIRUBIN UA: NEGATIVE mg/dL
Bilirubin, UA: NEGATIVE
Glucose, UA: NEGATIVE mg/dL
LEUKOCYTES UA: NEGATIVE
Nitrite, UA: NEGATIVE
PH UA: 5.5 (ref 5.0–8.0)
PROTEIN UA: NEGATIVE mg/dL
SPEC GRAV UA: 1.025 (ref 1.010–1.025)
UROBILINOGEN UA: 0.2 U/dL

## 2017-03-18 LAB — POCT UA - MICROSCOPIC ONLY
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
## 2017-03-18 NOTE — Patient Instructions (Signed)
Thank you for coming in today, it was so nice to see you! Today we talked about:    Urine issues: Your urine doesn't show any signs of infection. Your urine did show some blood. I would like for you to see a bladder specialist. I have placed a referral for the urologist. Someone will call you to schedule his appointment. Please call the clinic if you have not heard from anybody in 2 weeks.  For hot flashes you can take Black cohosh as stated on the bottle   If we ordered any tests today, you will be notified via telephone of any abnormalities. If everything is normal you will get a letter in the mail.   If you have any questions or concerns, please do not hesitate to call the office at [phone]. You can also message me directly via MyChart.   Sincerely,  Smitty Cords, MD

## 2017-03-20 LAB — URINE CULTURE
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
## 2017-03-20 NOTE — Assessment & Plan Note (Signed)
Noted on several UAs. See other A/P

## 2017-03-21 MED ORDER — CEPHALEXIN 500 MG PO CAPS: 500.0000 mg | ORAL_CAPSULE | Freq: Two times a day (BID) | ORAL | 0 refills | Status: AC

## 2017-04-15 ENCOUNTER — Other Ambulatory Visit: Payer: Self-pay | Admitting: *Deleted

## 2017-04-15 ENCOUNTER — Other Ambulatory Visit: Payer: Self-pay | Admitting: Family Medicine

## 2017-04-15 MED ORDER — MECLIZINE HCL 25 MG PO TABS: 25.0000 mg | ORAL_TABLET | Freq: Two times a day (BID) | ORAL | 0 refills | Status: DC

## 2017-04-17 ENCOUNTER — Emergency Department (HOSPITAL_COMMUNITY)
Admission: EM | Admit: 2017-04-17 | Discharge: 2017-04-17 | Disposition: A | Discharge status: Home / Self Care | Payer: Medicare Other | Attending: Emergency Medicine | Admitting: Emergency Medicine

## 2017-04-17 ENCOUNTER — Encounter (HOSPITAL_COMMUNITY): Payer: Self-pay | Admitting: Emergency Medicine

## 2017-04-17 DIAGNOSIS — R51 Headache: Secondary | ICD-10-CM | POA: Insufficient documentation

## 2017-04-17 DIAGNOSIS — Y929 Unspecified place or not applicable: Secondary | ICD-10-CM | POA: Insufficient documentation

## 2017-04-17 DIAGNOSIS — Z79899 Other long term (current) drug therapy: Secondary | ICD-10-CM | POA: Insufficient documentation

## 2017-04-17 DIAGNOSIS — S80862A Insect bite (nonvenomous), left lower leg, initial encounter: Secondary | ICD-10-CM | POA: Insufficient documentation

## 2017-04-17 DIAGNOSIS — W57XXXA Bitten or stung by nonvenomous insect and other nonvenomous arthropods, initial encounter: Secondary | ICD-10-CM | POA: Insufficient documentation

## 2017-04-17 DIAGNOSIS — F17210 Nicotine dependence, cigarettes, uncomplicated: Secondary | ICD-10-CM | POA: Insufficient documentation

## 2017-04-17 DIAGNOSIS — Y999 Unspecified external cause status: Secondary | ICD-10-CM | POA: Insufficient documentation

## 2017-04-17 DIAGNOSIS — L989 Disorder of the skin and subcutaneous tissue, unspecified: Secondary | ICD-10-CM | POA: Insufficient documentation

## 2017-04-17 DIAGNOSIS — Y939 Activity, unspecified: Secondary | ICD-10-CM | POA: Insufficient documentation

## 2017-04-17 NOTE — ED Provider Notes (Signed)
[facility] DEPT Provider Note   CSN: [id number] Arrival date & time: 04/17/17  1552   By signing my name below, I, Soijett Blue, attest that this documentation has been prepared under the direction and in the presence of Alferd Apa, [employer] Electronically Signed: Brimhall Nizhoni, ED Scribe. 04/17/17. 7:34 PM.  History   Chief Complaint Chief Complaint  Patient presents with  . Insect Bite  . Headache    HPI Kim Ross is a 61 y.o. female who presents to the Emergency Department complaining of insect bite to posterior RLE onset noon today. She states that she felt a bite to her RLE and noticed a nickel-sized spider to her RLE prior to the onset of her symptoms. Pt reports associated resolved posterior HA. She notes that she was watching tv some hours after the event when she noticed a mild sharp transient posterior HA that resolved after laying down for 1 hour. Pt has not tried any medications for the relief of her symptoms. She denies syncope, neck stiffness, vision change, weakness, slurred speech, nausea, vomiting, numbness, tingling, RLE pain, and any other symptoms. Denies hx of HA. She is not taking daily blood thinners.   The history is provided by the patient. No language interpreter was used.    Past Medical History:  Diagnosis Date  . Anxiety   . Depression   . History of recurrent UTIs     Patient Active Problem List   Diagnosis Date Noted  . UTI (urinary tract infection) 02/09/2017  . Healthcare maintenance 09/30/2016  . Left leg pain 11/20/2015  . Bilateral leg pain 09/19/2015  . Incontinence of urine in female 08/08/2015  . Tinea pedis of right foot 07/24/2015  . Hematuria 04/01/2015  . History of recurrent UTIs 01/31/2014  . Rash and nonspecific skin eruption 06/27/2013  . Vertigo, intermittent 06/27/2013  . Generalized anxiety disorder 04/10/2013  . Urinary frequency 04/10/2013  . Irritable bowel syndrome 10/19/2012  . Rectal bleeding 09/22/2012  .  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 Tobacco abuse 07/20/2012    Past Surgical History:  Procedure Laterality Date  . ABDOMINAL HYSTERECTOMY    . HEMORRHOID SURGERY      OB History    No data available       Home Medications    Prior to Admission medications   Medication Sig Start Date End Date Taking? Authorizing Provider  atorvastatin (LIPITOR) 40 MG tablet Take 1 tablet (40 mg total) by mouth daily. 10/06/16   Vivi Barrack, MD  Butenafine HCl (LOTRIMIN ULTRA) 1 % cream Apply twice daily to affected area. 10/19/12   Cletus Gash, MD  cetirizine (ZYRTEC ALLERGY) 10 MG tablet Take 1 tablet (10 mg total) by mouth daily. 08/08/16   Delsa Grana, PA-C  [facility] ATHLETES FOOT 1 % cream APPLY TOPICALLY TWICE A DAY FOR 2 WEEKS 04/14/16   Vivi Barrack, MD  cyclobenzaprine (FLEXERIL) 5 MG tablet Take 1 tablet (5 mg total) by mouth at bedtime as needed. Reported on 11/20/2015 11/20/15   Olam Idler, MD  fluticasone [facility]) 50 MCG/ACT nasal spray Place 2 sprays into both nostrils daily. 05/23/16   Waynetta Pean, PA-C  gabapentin (NEURONTIN) 100 MG capsule Take 1 capsule (100 mg total) by mouth 3 (three) times daily. 09/19/15   Olam Idler, MD  ibuprofen (ADVIL,MOTRIN) 800 MG tablet Take 1 tablet (800 mg total) by mouth every 8 (eight) hours as needed. 11/20/15   Olam Idler, MD  ketoconazole (NIZORAL) 2 % cream  APPLY TOPICALLY DAILY FOR THE NEXT 4 WEEKS. 09/10/15   Olam Idler, MD  meclizine (ANTIVERT) 25 MG tablet Take 1 tablet (25 mg total) by mouth 2 (two) times daily. 04/15/17   Mayo, Pete Pelt, MD  Multiple Vitamins-Minerals (MULTIVITAMIN WITH MINERALS) tablet Take 1 tablet by mouth daily. 04/27/13   Olam Idler, MD  sertraline (ZOLOFT) 50 MG tablet TAKE 1 TABLET BY MOUTH EVERY DAY 08/18/16   Vivi Barrack, MD  sodium chloride (OCEAN) 0.65 % SOLN nasal spray Place 1 spray into both nostrils as needed for congestion. 08/08/16   Delsa Grana, PA-C  terbinafine (LAMISIL) 250  MG tablet Take 1 tablet (250 mg total) by mouth daily. 03/22/16   Olam Idler, MD  triamcinolone cream (KENALOG) 0.1 % Apply topically 2 (two) times daily. 06/27/13   Olam Idler, MD    Family History Family History  Problem Relation Age of Onset  . Asthma Mother   . Cancer Sister 38       Pancreatic    Social History Social History  Substance Use Topics  . Smoking status: Current Every Day Smoker    Packs/day: 0.50    Types: Cigarettes  . Smokeless tobacco: Never Used  . Alcohol use 0.6 oz/week    1 Cans of beer per week     Allergies   Trazodone and nefazodone   Review of Systems Review of Systems  Eyes: Negative for visual disturbance.  Musculoskeletal: Negative for arthralgias and myalgias.  Skin: Positive for wound (insect bite to RLE).  Neurological: Positive for headaches. Negative for speech difficulty, weakness and numbness.     Physical Exam Updated Vital Signs BP (!) 158/71 (BP Location: Left Arm)   Pulse 100   Temp 98.4 F (36.9 C) (Oral)   Resp 18   SpO2 100%   Physical Exam  Constitutional: She appears well-developed and well-nourished.  HENT:  Head: Normocephalic and atraumatic.  Right Ear: External ear normal.  Left Ear: External ear normal.  Eyes: Pupils are equal, round, and reactive to light. Conjunctivae and EOM are normal. Right eye exhibits no discharge. Left eye exhibits no discharge. No scleral icterus. Right eye exhibits normal extraocular motion. Left eye exhibits normal extraocular motion.  No photophobia  Neck: Normal range of motion and full passive range of motion without pain. Neck supple. No spinous process tenderness present. Normal range of motion present.  Cardiovascular: Normal rate, regular rhythm and normal heart sounds.  Exam reveals no gallop and no friction rub.   No murmur heard. Pulmonary/Chest: Effort normal and breath sounds normal. No respiratory distress. She has no wheezes. She has no rales.    Musculoskeletal:       Right knee: Normal.       Right ankle: Normal.  Neurovascularly intact distally. Compartments soft.   Neurological: She is alert. She has normal strength. Coordination and gait normal.  Speech clear. Follows commands. No facial droop. PERRLA. EOMI. Normal peripheral fields. CN III-XII intact.  Grossly moves all extremities 4 without ataxia. Coordination intact. Able and appropriate strength for age to upper and lower extremities bilaterally including grip strength. Sensation to light touch intact bilaterally for upper and lower. Patellar deep tendon reflex 2+ and equal bilaterally. Normal finger to nose and rapid alternating movements. Normal heel to shin balance. No pronator drift. Normal gait.   Skin: Skin is warm and dry. Capillary refill takes less than 2 seconds. No pallor.  0.5 cm flat papule on the back  of right calf. No surrounding erythema or induration. No fluctuance. Temperature equal to touch b/l. No evidence of infection.   Psychiatric: She has a normal mood and affect.  Nursing note and vitals reviewed.    ED Treatments / Results  DIAGNOSTIC STUDIES: Oxygen Saturation is 100% on RA, nl by my interpretation.    COORDINATION OF CARE: 4:21 PM Discussed treatment plan with pt at bedside and pt agreed to plan.   Labs (all labs ordered are listed, but only abnormal results are displayed) Labs Reviewed - No data to display  EKG  EKG Interpretation None       Radiology No results found.  Procedures Procedures (including critical care time)  Medications Ordered in ED Medications - No data to display   Initial Impression / Assessment and Plan / ED Course  I have reviewed the triage vital signs and the nursing notes.  Pertinent labs & imaging results that were available during my care of the patient were reviewed by me and considered in my medical decision making (see chart for details).     61 year old female presenting today for insect  bite to back of the left calf. No evidence of infection on exam. Patient headache does not to appear to be related and has resolved. Normal neuro exam. Conservative treatment advised. I advised the patient to follow-up with their primary care provider this week for persistent symptoms. I advised the patient to return to the emergency department with new or worsening symptoms or new concerns. Specific return precautions discussed. The patient verbalized understanding and agreement with plan. All questions answered. No further questions at this time. Patient appears safe for discharge.   Patient case discussed with Dr. Rogene Houston who is in agreement with plan.   Final Clinical Impressions(s) / ED Diagnoses   Final diagnoses:  Insect bite, initial encounter    New Prescriptions Discharge Medication List as of 04/17/2017  4:38 PM     I personally performed the services described in this documentation, which was scribed in my presence. The recorded information has been reviewed and is accurate.     Lorelle Gibbs 04/17/17 Sanjuan Dame, MD 04/18/17 [id number]

## 2017-04-17 NOTE — ED Triage Notes (Signed)
Pt bit by spider inside house to posterior right leg this morning. New onset sharp throbbing posterior headache at time of bite, lasted about 1 hour. No history of similar headache. Pt saw spider at time of bite and knocked it away. 0.5 cm diameter raised round lesion to posterior right lower leg.

## 2017-04-17 NOTE — Discharge Instructions (Signed)
There is no evidence of infection from the spider bite. This does not require antibiotics. You can apply benadryl cream to the area for itching. If you develop fever, drainage, spreading redness you can come back for evaluation. If you develop worsening or new concerning symptoms you can return to the emergency department for re-evaluation.

## 2017-05-16 ENCOUNTER — Other Ambulatory Visit: Payer: Self-pay | Admitting: Internal Medicine

## 2017-06-21 ENCOUNTER — Other Ambulatory Visit: Payer: Self-pay | Admitting: Internal Medicine

## 2017-06-30 ENCOUNTER — Ambulatory Visit (INDEPENDENT_AMBULATORY_CARE_PROVIDER_SITE_OTHER): Payer: Self-pay | Admitting: Internal Medicine

## 2017-06-30 ENCOUNTER — Other Ambulatory Visit: Payer: Self-pay | Admitting: Internal Medicine

## 2017-06-30 ENCOUNTER — Encounter: Payer: Self-pay | Admitting: Internal Medicine

## 2017-06-30 VITALS — BP 118/65 | HR 95 | Temp 98.1°F | Wt 159.0 lb

## 2017-06-30 DIAGNOSIS — Z23 Encounter for immunization: Secondary | ICD-10-CM

## 2017-06-30 DIAGNOSIS — R399 Unspecified symptoms and signs involving the genitourinary system: Secondary | ICD-10-CM

## 2017-06-30 DIAGNOSIS — M25521 Pain in right elbow: Secondary | ICD-10-CM

## 2017-06-30 DIAGNOSIS — N3001 Acute cystitis with hematuria: Secondary | ICD-10-CM

## 2017-06-30 DIAGNOSIS — Z1231 Encounter for screening mammogram for malignant neoplasm of breast: Secondary | ICD-10-CM

## 2017-06-30 DIAGNOSIS — R3121 Asymptomatic microscopic hematuria: Secondary | ICD-10-CM

## 2017-06-30 DIAGNOSIS — H8110 Benign paroxysmal vertigo, unspecified ear: Secondary | ICD-10-CM

## 2017-06-30 DIAGNOSIS — R42 Dizziness and giddiness: Secondary | ICD-10-CM

## 2017-06-30 LAB — POCT URINALYSIS DIP (MANUAL ENTRY)
BILIRUBIN UA: NEGATIVE mg/dL
Glucose, UA: NEGATIVE mg/dL
Nitrite, UA: NEGATIVE
Protein Ur, POC: NEGATIVE mg/dL
SPEC GRAV UA: 1.025 (ref 1.010–1.025)
Urobilinogen, UA: 1 E.U./dL
pH, UA: 6 (ref 5.0–8.0)

## 2017-06-30 MED ORDER — IBUPROFEN 600 MG PO TABS: 600.0000 mg | ORAL_TABLET | Freq: Three times a day (TID) | ORAL | 0 refills | Status: DC | PRN

## 2017-06-30 MED ORDER — CEPHALEXIN 500 MG PO CAPS: 500.0000 mg | ORAL_CAPSULE | Freq: Four times a day (QID) | ORAL | 0 refills | Status: DC

## 2017-06-30 NOTE — Assessment & Plan Note (Signed)
Pain likely due to soft tissue contusion. No bony abnormalities or significant trauma to suggest fracture. No swelling, erythema, or warmth to suggest bursitis.  - Ibuprofen 600mg  tid prn - Follow-up if no improvement. Could consider getting x-rays at that point.

## 2017-06-30 NOTE — Assessment & Plan Note (Signed)
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 Vestibular neuritis is less likely because she is not having severe, persistent vertigo. Meniere disease is unlikely, as patient is not having associated symptoms of tinnitus, hearing loss, or a feeling of ear fullness. No associated headache to suggest vestibular migraine. Neuro exam completely normal, making TIA or CVA less likely. - Continue Meclizine - Referral placed to vestibular rehab

## 2017-06-30 NOTE — Patient Instructions (Signed)
For your vertigo- I have placed a referral to vestibular rehab so that they can teach you things you can do when your vertigo gets bad.  For your right elbow pain- I have prescribed some Ibuprofen 600mg . You can use this three times a day as needed. If you feel like this is not getting better or if it worsens, let me know and we can send you for an x-ray.  For your UTI- Please try to bring your urology medical records to our clinic so that I can review them. I have started Keflex four times daily for 7 days. I will call you if we need to switch antibiotics.  -Dr. Brett Albino

## 2017-06-30 NOTE — Assessment & Plan Note (Signed)
UA today noted to have moderate RBCs. On chart review, she has had hematuria since 2013. She has seen [facility] in the past, but their note from 2016 did not mention hematuria. Patient is a current smoker and is at increased risk for bladder cancer.  - Patient will obtain her records from [facility] and bring them to our office - If she has not had a cystoscopy done, will refer back to [facility] for further management.
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
## 2017-06-30 NOTE — Assessment & Plan Note (Signed)
Hx of recurrent UTIs. Patient having urinary urgency and frequency. No fevers or CVA tenderness to suggest pyelonephritis.  - UA performed in clinic with 1+ leukocytes, negative nitrites. - Given her symptoms, will treat with Keflex qid x 7 days - Attempted to order urine culture, but patient did not give a large enough specimen to culture.

## 2017-07-04 ENCOUNTER — Other Ambulatory Visit: Payer: Self-pay | Admitting: *Deleted

## 2017-07-04 ENCOUNTER — Other Ambulatory Visit: Payer: Self-pay | Admitting: Internal Medicine

## 2017-07-04 ENCOUNTER — Telehealth: Payer: Self-pay | Admitting: Internal Medicine

## 2017-07-04 MED ORDER — ONDANSETRON 4 MG PO TBDP: 4.0000 mg | ORAL_TABLET | Freq: Three times a day (TID) | ORAL | 0 refills | Status: DC | PRN

## 2017-07-04 NOTE — Telephone Encounter (Signed)
Requesting 90 day supply. Hubbard Hartshorn, RN, BSN

## 2017-07-04 NOTE — Progress Notes (Signed)
Received a phone call that patient is having severe dizziness and nausea. Returned patient's call. She states she is having a severe episode of vertigo that is causing the room to spin and causing her to be very nauseated. It feels like a typical episode of vertigo for her. The symptoms have overall gotten better. She is requesting something to help with the nausea while she waits for the symptoms to get better. She is also awaiting referral to vestibular rehab, which was ordered 9/27 and is in process. Zofran called into pharmacy. Return precautions discussed. Patient should be seen in the ED or in clinic if symptoms persist.  Hyman Bible, MD

## 2017-07-04 NOTE — Telephone Encounter (Signed)
Patient left message on nurse line stating that she has been experiencing severe dizziness and nausea with her vertigo since this weekend. Would like to know if MD could call someting in to her pharmacy

## 2017-07-04 NOTE — Telephone Encounter (Signed)
Pt is calling back and would like to know if the doctor is going to call in anything for her. She said that she has been waiting since 8:30 AM for the doctor to call in something. jw

## 2017-07-05 MED ORDER — IBUPROFEN 600 MG PO TABS: 600.0000 mg | ORAL_TABLET | Freq: Three times a day (TID) | ORAL | 0 refills | Status: DC | PRN

## 2017-07-07 ENCOUNTER — Ambulatory Visit: Payer: Medicare Other

## 2017-07-15 ENCOUNTER — Other Ambulatory Visit: Payer: Self-pay | Admitting: Internal Medicine

## 2017-07-15 MED ORDER — IBUPROFEN 600 MG PO TABS: 600.0000 mg | ORAL_TABLET | Freq: Three times a day (TID) | ORAL | 0 refills | Status: DC | PRN

## 2017-07-19 ENCOUNTER — Other Ambulatory Visit: Payer: Self-pay | Admitting: Internal Medicine

## 2017-07-19 MED ORDER — MECLIZINE HCL 25 MG PO TABS: 25.0000 mg | ORAL_TABLET | Freq: Two times a day (BID) | ORAL | 0 refills | Status: DC

## 2017-07-22 MED ORDER — SERTRALINE HCL 50 MG PO TABS: 50.0000 mg | ORAL_TABLET | Freq: Every day | ORAL | 3 refills | Status: DC

## 2017-07-26 ENCOUNTER — Other Ambulatory Visit: Payer: Self-pay | Admitting: Internal Medicine

## 2017-07-26 MED ORDER — SERTRALINE HCL 50 MG PO TABS: 50.0000 mg | ORAL_TABLET | Freq: Every day | ORAL | 3 refills | Status: DC

## 2017-07-28 ENCOUNTER — Other Ambulatory Visit: Payer: Self-pay | Admitting: Internal Medicine

## 2017-07-28 MED ORDER — MECLIZINE HCL 25 MG PO TABS: 25.0000 mg | ORAL_TABLET | Freq: Two times a day (BID) | ORAL | 0 refills | Status: DC

## 2017-08-16 ENCOUNTER — Telehealth: Payer: Self-pay | Admitting: *Deleted

## 2017-08-16 NOTE — Telephone Encounter (Signed)
Returned patient's call. I had referred her to vestibular rehab for this a few months ago. Per chart review, [facility] left a voicemail on 07/21/17 for patient to schedule an appointment to be seen, but she never called them back. She states she never got the voicemail. I gave her their number and recommended that she call to see if she can get in for a vestibular evaluation, given that she is having vertigo currently. Patient stated that she will call tomorrow to see if she can get in.   Hyman Bible, MD PGY-3

## 2017-08-16 NOTE — Telephone Encounter (Signed)
Patient left message on nurse line requesting return call from PCP. Patient has been in bed for 2 days with vertigo unrelieved with meclizine twice daily. Is there anything else she can do? L. Silvano Rusk, RN, BSN

## 2017-08-19 ENCOUNTER — Ambulatory Visit: Payer: Medicare Other | Admitting: Physical Therapy

## 2017-09-15 ENCOUNTER — Ambulatory Visit: Payer: Medicare Other | Admitting: Physical Therapy

## 2017-09-19 ENCOUNTER — Ambulatory Visit: Payer: Medicare Other | Attending: Family Medicine | Admitting: Rehabilitative and Restorative Service Providers"

## 2017-10-07 ENCOUNTER — Other Ambulatory Visit: Payer: Self-pay | Admitting: Internal Medicine

## 2017-10-07 MED ORDER — ATORVASTATIN CALCIUM 40 MG PO TABS: 40.0000 mg | ORAL_TABLET | Freq: Every day | ORAL | 3 refills | Status: DC

## 2017-12-01 ENCOUNTER — Other Ambulatory Visit: Payer: Self-pay | Admitting: Internal Medicine

## 2017-12-15 ENCOUNTER — Encounter: Payer: Medicaid Other | Admitting: Internal Medicine

## 2017-12-16 ENCOUNTER — Encounter: Payer: Medicaid Other | Admitting: Internal Medicine

## 2017-12-29 ENCOUNTER — Other Ambulatory Visit: Payer: Self-pay

## 2017-12-29 ENCOUNTER — Ambulatory Visit (INDEPENDENT_AMBULATORY_CARE_PROVIDER_SITE_OTHER): Payer: Medicare Other | Admitting: Internal Medicine

## 2017-12-29 ENCOUNTER — Encounter: Payer: Self-pay | Admitting: Internal Medicine

## 2017-12-29 VITALS — BP 120/60 | HR 93 | Temp 97.7°F | Wt 158.0 lb

## 2017-12-29 DIAGNOSIS — M25521 Pain in right elbow: Secondary | ICD-10-CM

## 2017-12-29 DIAGNOSIS — N3001 Acute cystitis with hematuria: Secondary | ICD-10-CM

## 2017-12-29 DIAGNOSIS — Z1231 Encounter for screening mammogram for malignant neoplasm of breast: Secondary | ICD-10-CM

## 2017-12-29 DIAGNOSIS — Z1239 Encounter for other screening for malignant neoplasm of breast: Secondary | ICD-10-CM

## 2017-12-29 DIAGNOSIS — E785 Hyperlipidemia, unspecified: Secondary | ICD-10-CM

## 2017-12-29 DIAGNOSIS — J302 Other seasonal allergic rhinitis: Secondary | ICD-10-CM | POA: Diagnosis not present

## 2017-12-29 DIAGNOSIS — R319 Hematuria, unspecified: Secondary | ICD-10-CM | POA: Diagnosis not present

## 2017-12-29 DIAGNOSIS — Z1211 Encounter for screening for malignant neoplasm of colon: Secondary | ICD-10-CM

## 2017-12-29 LAB — POCT URINALYSIS DIP (MANUAL ENTRY)
Bilirubin, UA: NEGATIVE
GLUCOSE UA: NEGATIVE mg/dL
Ketones, POC UA: NEGATIVE mg/dL
LEUKOCYTES UA: NEGATIVE
Nitrite, UA: POSITIVE — AB
Protein Ur, POC: NEGATIVE mg/dL
SPEC GRAV UA: 1.025 (ref 1.010–1.025)
UROBILINOGEN UA: 0.2 U/dL
pH, UA: 6 (ref 5.0–8.0)

## 2017-12-29 LAB — POCT UA - MICROSCOPIC ONLY

## 2017-12-29 MED ORDER — CEPHALEXIN 500 MG PO CAPS: 500.0000 mg | ORAL_CAPSULE | Freq: Four times a day (QID) | ORAL | 0 refills | Status: DC
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
## 2017-12-29 NOTE — Patient Instructions (Addendum)
It was so nice to see you!  For your UTI- I have prescribed some Keflex. Please take 1 tablet 4 times a day for 7 days. I have also checked your kidney labs.  For your elbow pain- I would recommend doing ice a couple times per day. You can also buy some over the counter Aspercreme.  I will call you with your lab results.  -Dr. Brett Albino

## 2017-12-30 ENCOUNTER — Telehealth: Payer: Self-pay | Admitting: Internal Medicine

## 2017-12-30 DIAGNOSIS — E785 Hyperlipidemia, unspecified: Secondary | ICD-10-CM | POA: Insufficient documentation

## 2017-12-30 DIAGNOSIS — Z1239 Encounter for other screening for malignant neoplasm of breast: Secondary | ICD-10-CM

## 2017-12-30 DIAGNOSIS — Z1211 Encounter for screening for malignant neoplasm of colon: Secondary | ICD-10-CM | POA: Insufficient documentation

## 2017-12-30 DIAGNOSIS — Z716 Tobacco abuse counseling: Secondary | ICD-10-CM | POA: Insufficient documentation

## 2017-12-30 DIAGNOSIS — J302 Other seasonal allergic rhinitis: Secondary | ICD-10-CM | POA: Insufficient documentation

## 2017-12-30 HISTORY — DX: Encounter for other screening for malignant neoplasm of breast: Z12.39

## 2017-12-30 LAB — LIPID PANEL
CHOLESTEROL TOTAL: 123 mg/dL (ref 100–199)
Chol/HDL Ratio: 3 ratio (ref 0.0–4.4)
HDL: 41 mg/dL (ref >39)
LDL Calculated: 57 mg/dL (ref 0–99)
Triglycerides: 125 mg/dL (ref 0–149)
VLDL Cholesterol Cal: 25 mg/dL (ref 5–40)

## 2017-12-30 LAB — CBC
Hematocrit: 39.4 % (ref 34.0–46.6)
Hemoglobin: 12.8 g/dL (ref 11.1–15.9)
MCH: 29.9 pg (ref 26.6–33.0)
MCHC: 32.5 g/dL (ref 31.5–35.7)
MCV: 92 fL (ref 79–97)
PLATELETS: 341 10*3/uL (ref 150–379)
RBC: 4.28 x10E6/uL (ref 3.77–5.28)
RDW: 14.4 % (ref 12.3–15.4)
WBC: 11.5 10*3/uL — ABNORMAL HIGH (ref 3.4–10.8)

## 2017-12-30 LAB — BASIC METABOLIC PANEL
BUN/Creatinine Ratio: 14 (ref 12–28)
BUN: 14 mg/dL (ref 8–27)
CALCIUM: 8.9 mg/dL (ref 8.7–10.3)
CHLORIDE: 106 mmol/L (ref 96–106)
CO2: 23 mmol/L (ref 20–29)
Creatinine, Ser: 0.99 mg/dL (ref 0.57–1.00)
GFR, EST AFRICAN AMERICAN: 72 mL/min/{1.73_m2} (ref >59)
GFR, EST NON AFRICAN AMERICAN: 62 mL/min/{1.73_m2} (ref >59)
Glucose: 100 mg/dL — ABNORMAL HIGH (ref 65–99)
Potassium: 4.3 mmol/L (ref 3.5–5.2)
Sodium: 143 mmol/L (ref 134–144)

## 2017-12-30 MED ORDER — FLUTICASONE PROPIONATE 50 MCG/ACT NA SUSP: 2.0000 | Freq: Every day | NASAL | 1 refills | Status: DC

## 2017-12-30 NOTE — Assessment & Plan Note (Signed)
Likely soft tissue injury from repeatedly putting her elbow against the shower wall to brace herself. - Advised patient to use ice to the area - Can try some OTC Aspercreme - Would like to obtain x-rays, but patient states her insurance will not cover these and she cannot afford them

## 2017-12-30 NOTE — Telephone Encounter (Signed)
Handicap placard form dropped off for at front desk for completion.  Verified that patient section of form has been completed.  Last DOS/WCC with PCP was 12/29/17.  Placed form in team folder to be completed by clinical staff.  Crista Luria

## 2017-12-30 NOTE — Telephone Encounter (Signed)
Form completed and given to RN.

## 2017-12-30 NOTE — Assessment & Plan Note (Signed)
Order placed for mammogram.

## 2017-12-30 NOTE — Telephone Encounter (Signed)
Clinical info completed on handicap form.  Place form in Dr. Velia Meyer box for completion.  Kim Ross, [name]

## 2017-12-30 NOTE — Assessment & Plan Note (Signed)
Patient with dysuria, urinary frequency, urinary urgency. UA with positive nitrites and 2+ rods. No CVA tenderness or fevers to suggest pyelonephritis. - Keflex 500mg  qid x 7 days - Follow-up if no improvement

## 2017-12-30 NOTE — Telephone Encounter (Signed)
Form mailed to pt per pt request. Copy placed in scan box. Wallace Cullens, RN

## 2017-12-30 NOTE — Assessment & Plan Note (Signed)
Last lipid panel 09/2016 with Chol 183, HDL 43, LDL 108, TG 162. - Continue Lipitor 40mg  daily - Recheck lipid panel

## 2017-12-30 NOTE — Assessment & Plan Note (Signed)
Worsening recently with the change in weather. - Restart Flonase

## 2017-12-30 NOTE — Assessment & Plan Note (Addendum)
Patient has had multiple UAs with moderate RBCs. Reviewed urology records- she has seen [facility] in the past and had urodynamic studies done, but doesn't appear to have had a cystoscopy done. Patient is a current smoker, so she is at increased risk of bladder cancer. Patient states she cannot afford co-pay to go back to see urology. - Will send message to Kennyth Lose to see if we can get her in to see urology - Check CBC and BMP

## 2017-12-30 NOTE — Assessment & Plan Note (Signed)
Recommended patient have colonoscopy done. Discussed benefits, but patient declined. Agreeable to cologuard. - Cologuard ordered

## 2018-01-02 ENCOUNTER — Other Ambulatory Visit: Payer: Self-pay | Admitting: Internal Medicine

## 2018-01-02 DIAGNOSIS — R3121 Asymptomatic microscopic hematuria: Secondary | ICD-10-CM

## 2018-01-02 DIAGNOSIS — Z1231 Encounter for screening mammogram for malignant neoplasm of breast: Secondary | ICD-10-CM

## 2018-01-04 ENCOUNTER — Telehealth: Payer: Self-pay | Admitting: Internal Medicine

## 2018-01-04 NOTE — Telephone Encounter (Signed)
Called patient and discussed her lab results with her. Also discussed that I have placed a referral to urology for her due to her microscopic hematuria on multiple UA's (even in the setting of no infection). Patient voiced understanding.  Hyman Bible, MD PGY-3

## 2018-01-20 ENCOUNTER — Ambulatory Visit: Payer: Medicare Other

## 2018-02-02 ENCOUNTER — Other Ambulatory Visit: Payer: Self-pay | Admitting: Internal Medicine

## 2018-02-10 ENCOUNTER — Ambulatory Visit: Payer: Medicare Other

## 2018-02-13 ENCOUNTER — Ambulatory Visit: Payer: Self-pay

## 2018-02-15 ENCOUNTER — Ambulatory Visit: Payer: Self-pay

## 2018-02-22 ENCOUNTER — Other Ambulatory Visit: Payer: Self-pay | Admitting: Internal Medicine

## 2018-02-28 ENCOUNTER — Other Ambulatory Visit: Payer: Self-pay | Admitting: Internal Medicine

## 2018-02-28 DIAGNOSIS — Z1231 Encounter for screening mammogram for malignant neoplasm of breast: Secondary | ICD-10-CM

## 2018-03-07 NOTE — Progress Notes (Signed)
   Subjective:    Patient ID: Kim Ross, female    DOB: January 07, 1956, 62 y.o.   MRN: [id number]   CC: UTI  HPI: UTI She comes in stating she feels like she has a UTI, states that she gets them "all the time" this feels just like them.  States that she usually gets them every 6 months and symptoms usually go away after treated with Keflex..  Patient reports increased frequency urgency dysuria and odor x7 days.  Patient also reports some mild intermittent flank pain but this occurs during every single UTI.  Did not report frank hematuria.  Denies fevers chills nausea vomiting or diarrhea.  Objective:  BP 128/72   Pulse 95   Temp 98 F (36.7 C) (Oral)   Ht 5\' 5"  (1.651 m)   Wt 158 lb 12.8 oz (72 kg)   SpO2 99%   BMI 26.43 kg/m  Vitals and nursing note reviewed  General: well nourished, in no acute distress HEENT: normocephalic, TM's visualized bilaterally, Neck: supple, non-tender, without lymphadenopathy Cardiac: RRR, clear S1 and S2, no murmurs, rubs, or gallops Respiratory: clear to auscultation bilaterally, no increased work of breathing Abdomen: soft, nontender, nondistended, no masses or organomegaly.  No suprapubic tenderness.  Bowel sounds present Extremities: no edema or cyanosis. Warm, well perfused. 2+ radial pulses bilaterally MSK: No CVA tenderness Skin: warm and dry, no rashes noted Neuro: alert and oriented, no focal deficits  Assessment & Plan:    UTI (urinary tract infection) Patient reporting symptoms consistent with UTI (dysuria increased frequency urgency and odor).  UA positive for nitrites.  No CVA tenderness or fevers, less likely pyelonephritis.  Plan to give course of Keflex for 7 days.  Patient aware and agreeable with plan. -Keflex 500 mg 4 times daily x7 days -Follow-up if no improvement or worsening symptoms   Return if symptoms worsen or fail to improve.   Caroline More, DO, PGY-1

## 2018-03-08 ENCOUNTER — Other Ambulatory Visit: Payer: Self-pay

## 2018-03-08 ENCOUNTER — Encounter: Payer: Self-pay | Admitting: Family Medicine

## 2018-03-08 ENCOUNTER — Ambulatory Visit (INDEPENDENT_AMBULATORY_CARE_PROVIDER_SITE_OTHER): Payer: Self-pay | Admitting: Family Medicine

## 2018-03-08 VITALS — BP 128/72 | HR 95 | Temp 98.0°F | Ht 65.0 in | Wt 158.8 lb

## 2018-03-08 DIAGNOSIS — R319 Hematuria, unspecified: Secondary | ICD-10-CM

## 2018-03-08 DIAGNOSIS — N390 Urinary tract infection, site not specified: Secondary | ICD-10-CM

## 2018-03-08 DIAGNOSIS — R300 Dysuria: Secondary | ICD-10-CM

## 2018-03-08 LAB — POCT URINALYSIS DIP (MANUAL ENTRY)
Bilirubin, UA: NEGATIVE
Glucose, UA: NEGATIVE mg/dL
Ketones, POC UA: NEGATIVE mg/dL
LEUKOCYTES UA: NEGATIVE
NITRITE UA: POSITIVE — AB
PH UA: 6.5 (ref 5.0–8.0)
Spec Grav, UA: 1.025 (ref 1.010–1.025)
UROBILINOGEN UA: 1 U/dL

## 2018-03-08 MED ORDER — CEPHALEXIN 500 MG PO CAPS
500.0000 mg | ORAL_CAPSULE | Freq: Four times a day (QID) | ORAL | 0 refills | Status: DC
Start: 2018-03-08 — End: 2018-08-03

## 2018-03-08 NOTE — Patient Instructions (Addendum)
Acute Urinary Retention, Female Urinary retention means you are unable to pee completely or at all (empty your bladder). Follow these instructions at home:  Drink enough fluids to keep your pee (urine) clear or pale yellow.  If you are sent home with a tube that drains the bladder (catheter), there will be a drainage bag attached to it. There are two types of bags. One is big that you can wear at night without having to empty it. One is smaller and needs to be emptied more often. ? Keep the drainage bag emptied. ? Keep the drainage bag lower than the tube.  Only take medicine as told by your doctor. Contact a doctor if:  You have a low-grade fever.  You have spasms or you are leaking pee when you have spasms. Get help right away if:  You have chills or a fever.  Your catheter stops draining pee.  Your catheter falls out.  You have increased bleeding that does not stop after you have rested and increased the amount of fluids you had been drinking. This information is not intended to replace advice given to you by your health care provider. Make sure you discuss any questions you have with your health care provider. Document Released: 03/08/2008 Document Revised: 02/26/2016 Document Reviewed: 03/01/2013 Elsevier Interactive Patient Education  2017 [employer].   It was a pleasure seeing you today.   Today we discussed your urinary tract infection   For your UTI: I have prescribed Keflex to be taken four times a day for 7 days.   Please follow up in 2 weeks if nor improvement or sooner if symptoms persist or worsen. Please call the clinic immediately if you have any concerns.   Our clinic's number is [phone]. Please call with questions or concerns.   Thank you,  Caroline More, DO

## 2018-03-08 NOTE — Assessment & Plan Note (Signed)
Patient reporting symptoms consistent with UTI (dysuria increased frequency urgency and odor).  UA positive for nitrites.  No CVA tenderness or fevers, less likely pyelonephritis.  Plan to give course of Keflex for 7 days.  Patient aware and agreeable with plan. -Keflex 500 mg 4 times daily x7 days -Follow-up if no improvement or worsening symptoms

## 2018-03-15 ENCOUNTER — Other Ambulatory Visit: Payer: Self-pay | Admitting: Internal Medicine

## 2018-04-19 ENCOUNTER — Other Ambulatory Visit: Payer: Self-pay | Admitting: *Deleted

## 2018-04-19 MED ORDER — FLUTICASONE PROPIONATE 50 MCG/ACT NA SUSP: NASAL | 1 refills | Status: DC

## 2018-04-21 ENCOUNTER — Other Ambulatory Visit: Payer: Self-pay | Admitting: Obstetrics and Gynecology

## 2018-04-21 DIAGNOSIS — Z1231 Encounter for screening mammogram for malignant neoplasm of breast: Secondary | ICD-10-CM

## 2018-05-23 ENCOUNTER — Other Ambulatory Visit: Payer: Self-pay | Admitting: Obstetrics and Gynecology

## 2018-05-23 DIAGNOSIS — Z1231 Encounter for screening mammogram for malignant neoplasm of breast: Secondary | ICD-10-CM

## 2018-05-25 ENCOUNTER — Ambulatory Visit (HOSPITAL_COMMUNITY): Payer: Medicaid Other

## 2018-05-26 ENCOUNTER — Other Ambulatory Visit: Payer: Self-pay | Admitting: Family Medicine

## 2018-06-20 ENCOUNTER — Other Ambulatory Visit: Payer: Self-pay | Admitting: Family Medicine

## 2018-07-04 ENCOUNTER — Other Ambulatory Visit: Payer: Self-pay | Admitting: *Deleted

## 2018-07-04 MED ORDER — MECLIZINE HCL 25 MG PO TABS: 25.0000 mg | ORAL_TABLET | Freq: Two times a day (BID) | ORAL | 3 refills | Status: DC

## 2018-07-18 ENCOUNTER — Ambulatory Visit
Admission: RE | Admit: 2018-07-18 | Discharge: 2018-07-18 | Disposition: A | Discharge status: Home / Self Care | Payer: Medicaid Other | Source: Ambulatory Visit | Attending: Obstetrics and Gynecology | Admitting: Obstetrics and Gynecology

## 2018-07-18 ENCOUNTER — Other Ambulatory Visit: Payer: Self-pay

## 2018-07-18 ENCOUNTER — Encounter (HOSPITAL_COMMUNITY): Payer: Self-pay

## 2018-07-18 ENCOUNTER — Ambulatory Visit (HOSPITAL_COMMUNITY)
Admission: RE | Admit: 2018-07-18 | Discharge: 2018-07-18 | Disposition: A | Discharge status: Home / Self Care | Payer: Medicare Other | Source: Ambulatory Visit | Attending: Obstetrics and Gynecology | Admitting: Obstetrics and Gynecology

## 2018-07-18 VITALS — BP 148/76 | Wt 155.0 lb

## 2018-07-18 DIAGNOSIS — Z1231 Encounter for screening mammogram for malignant neoplasm of breast: Secondary | ICD-10-CM

## 2018-07-18 DIAGNOSIS — Z1239 Encounter for other screening for malignant neoplasm of breast: Secondary | ICD-10-CM

## 2018-07-18 NOTE — Progress Notes (Signed)
No complaints today.   Pap Smear: Pap smear not completed today. Last Pap smear was in the 1980's and normal per patient. Per patient has no history of an abnormal Pap smear. 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 Patient does not need any further Pap smears due to her history of a hysterectomy for benign reasons per BCCCP and ACOG guidelines. No Pap smear results are in Epic.   Physical exam: Breasts Breasts symmetrical. No skin abnormalities bilateral breasts. No nipple retraction bilateral breasts. No nipple discharge bilateral breasts. No lymphadenopathy. No lumps palpated bilateral breasts. No complaints of pain or tenderness on exam. Referred patient to the [facility] for a screening mammogram. Appointment scheduled for Tuesday, July 18, 2018 at 1610.        Pelvic/Bimanual No Pap smear completed today since patient has a history of a hysterectomy for benign reasons. Pap smear not indicated per BCCCP guidelines.   Smoking History: Patient is a current smoker. Discussed smoking cessation with patient. Referred to the [facility] Quitline and gave resources to the free smoking cessation classes at [facility].  Patient Navigation: Patient education provided. Access to services provided for patient through [facility] program.   Colorectal Cancer Screening: Per patient has never had a colonoscopy completed. Patient completed a FIT Test 08/01/2013 that was negative. No complaints today. FIT Test given to patient to complete and return to BCCCP.  Breast and Cervical Cancer Risk Assessment: Patient has no family history of breast cancer, known genetic mutations, or radiation treatment to the chest before age 39. Patient has no history of cervical dysplasia, immunocompromised, or DES exposure in-utero.  Risk Assessment    Risk Scores      07/18/2018   Last edited by: Armond Hang, LPN   5-year risk: 1.1 %   Lifetime risk: 5.3 %

## 2018-07-18 NOTE — Patient Instructions (Signed)
Explained breast self awareness with [place]. Patient did not need a Pap smear today due to patient has a history of a hysterectomy for benign reasons. Let patient know that she doesn't need any further Pap smears due to her history of a hysterectomy for benign reasons. Referred patient to the [facility] for a screening mammogram. Appointment scheduled for Tuesday, July 18, 2018 at 1610. Let [facility] know the [facility] will follow up with her within the next couple weeks with results of mammogram by letter or phone. Discussed smoking cessation with patient. Referred to the [facility] Quitline and gave resources to the free smoking cessation classes at [facility]. Jardine verbalized understanding.  Falecia Vannatter, Arvil Chaco, RN 3:39 PM

## 2018-07-19 ENCOUNTER — Other Ambulatory Visit: Payer: Self-pay | Admitting: Family Medicine

## 2018-08-02 ENCOUNTER — Other Ambulatory Visit (HOSPITAL_COMMUNITY): Payer: Self-pay | Admitting: *Deleted

## 2018-08-02 ENCOUNTER — Encounter (HOSPITAL_COMMUNITY): Payer: Self-pay | Admitting: *Deleted

## 2018-08-02 ENCOUNTER — Telehealth: Payer: Self-pay | Admitting: Family Medicine

## 2018-08-02 DIAGNOSIS — Z0000 Encounter for general adult medical examination without abnormal findings: Secondary | ICD-10-CM

## 2018-08-02 NOTE — Telephone Encounter (Signed)
Clinical info completed on handicap form.  Place form in Dr. Tonette Bihari box for completion.  Kim Ross, [place]

## 2018-08-02 NOTE — Telephone Encounter (Signed)
[employer] Handicap form dropped off for handicap sticker at front desk for completion.  Verified that patient section of form has been completed.  Last DOS/WCC with PCP was 12/29/2017.  Placed form in team folder to be completed by clinical staff.  Kim Ross

## 2018-08-02 NOTE — Progress Notes (Unsigned)
Lipid

## 2018-08-03 ENCOUNTER — Other Ambulatory Visit: Payer: Self-pay

## 2018-08-03 ENCOUNTER — Ambulatory Visit: Payer: Medicaid Other

## 2018-08-03 ENCOUNTER — Ambulatory Visit (INDEPENDENT_AMBULATORY_CARE_PROVIDER_SITE_OTHER): Payer: Self-pay | Admitting: Family Medicine

## 2018-08-03 ENCOUNTER — Other Ambulatory Visit (HOSPITAL_COMMUNITY): Payer: Self-pay | Admitting: *Deleted

## 2018-08-03 VITALS — BP 120/62 | HR 90 | Temp 98.7°F | Ht 65.0 in | Wt 155.0 lb

## 2018-08-03 DIAGNOSIS — R319 Hematuria, unspecified: Secondary | ICD-10-CM

## 2018-08-03 DIAGNOSIS — M549 Dorsalgia, unspecified: Secondary | ICD-10-CM

## 2018-08-03 DIAGNOSIS — Z131 Encounter for screening for diabetes mellitus: Secondary | ICD-10-CM

## 2018-08-03 DIAGNOSIS — Z13220 Encounter for screening for lipoid disorders: Secondary | ICD-10-CM

## 2018-08-03 DIAGNOSIS — N390 Urinary tract infection, site not specified: Secondary | ICD-10-CM

## 2018-08-03 LAB — POCT URINALYSIS DIP (MANUAL ENTRY)
BILIRUBIN UA: NEGATIVE mg/dL
Bilirubin, UA: NEGATIVE
GLUCOSE UA: NEGATIVE mg/dL
LEUKOCYTES UA: NEGATIVE
NITRITE UA: POSITIVE — AB
Protein Ur, POC: NEGATIVE mg/dL
Spec Grav, UA: 1.02 (ref 1.010–1.025)
Urobilinogen, UA: 4 E.U./dL — AB
pH, UA: 7 (ref 5.0–8.0)

## 2018-08-03 LAB — POCT UA - MICROSCOPIC ONLY

## 2018-08-03 MED ORDER — CEPHALEXIN 500 MG PO CAPS: 500.0000 mg | ORAL_CAPSULE | Freq: Four times a day (QID) | ORAL | 0 refills | Status: DC

## 2018-08-03 NOTE — Progress Notes (Signed)
   Subjective:    Patient ID: Kim Ross, female    DOB: November 11, 1955, 62 y.o.   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  Patient states that she has had urinary odor, frequency, urgency x2 weeks.  States that she first try to see if it symptoms would go away which is why she waited till today to be seen.  Denies any burning or dysuria.  States that this feels exactly like the urinary tract infection she had in June.  At that time she says antibiotics helped.  Denies any hematuria.  Denies any vaginal discharge.  Denies sexual activity.  Report any fever or flank pain.  Objective:  BP 120/62   Pulse 90   Temp 98.7 F (37.1 C) (Oral)   Ht 5\' 5"  (1.651 m)   Wt 155 lb (70.3 kg)   SpO2 99%   BMI 25.79 kg/m  Vitals and nursing note reviewed  General: well nourished, in no acute distress HEENT: normocephalic, moist mucous membranes, good dentition without erythema or discharge noted in posterior oropharynx Cardiac: RRR, clear S1 and S2, no murmurs, rubs, or gallops Respiratory: clear to auscultation bilaterally, no increased work of breathing Abdomen: soft, nontender, nondistended, no masses or organomegaly. Bowel sounds present Extremities: no edema or cyanosis. Warm, well perfused. Skin: warm and dry, no rashes noted Neuro: alert and oriented, no focal deficits   Assessment & Plan:    UTI (urinary tract infection) Patient reporting symptoms consistent with UTI (increased frequency, urgency, odor). UA positive for nitrites, urobilinogen, and small blood. No CVA tenderness or fevers, so less concern for pyelonephritis. Plan for 7 day course of Keflex as patient found improvement with this in the past. Strict return precautions given. Follow up in 2 weeks if no improvement.  -Keflex 500mg  4 times daily x7days -F/u if no improvement     Return in about 2 weeks (around 08/17/2018), or if symptoms worsen  or fail to improve.   Caroline More, DO, PGY-2

## 2018-08-03 NOTE — Patient Instructions (Signed)
It was a pleasure seeing you today.   Today we discussed your UTI  For your UTI: please use Keflex. If no improvement in 1-2 weeks please come back in.   Please follow up in 1-2 weeks or sooner if symptoms persist or worsen. Please call the clinic immediately if you have any concerns.   Our clinic's number is [phone]. Please call with questions or concerns.   Please go to the emergency room if develop a fever or blood in your urine  You can make an appointment with DR. KOVAL for smoking cessation   Thank you,  Caroline More, DO

## 2018-08-03 NOTE — Assessment & Plan Note (Signed)
Patient reporting symptoms consistent with UTI (increased frequency, urgency, odor). UA positive for nitrites, urobilinogen, and small blood. No CVA tenderness or fevers, so less concern for pyelonephritis. Plan for 7 day course of Keflex as patient found improvement with this in the past. Strict return precautions given. Follow up in 2 weeks if no improvement.  -Keflex 500mg  4 times daily x7days -F/u if no improvement

## 2018-08-04 ENCOUNTER — Inpatient Hospital Stay: Payer: Medicare Other | Attending: Obstetrics and Gynecology

## 2018-08-04 ENCOUNTER — Ambulatory Visit: Payer: Medicaid Other

## 2018-08-09 ENCOUNTER — Ambulatory Visit: Payer: Medicaid Other

## 2018-08-16 NOTE — Telephone Encounter (Signed)
PCP handed form to RN. Left message for patient that form available for pick up at front desk. Copy placed in batch scanning.  Danley Danker, RN [facility] RN)

## 2018-08-20 LAB — FECAL OCCULT BLOOD, IMMUNOCHEMICAL: FECAL OCCULT BLD: NEGATIVE

## 2018-08-24 ENCOUNTER — Other Ambulatory Visit: Payer: Self-pay

## 2018-08-24 MED ORDER — SERTRALINE HCL 50 MG PO TABS: 50.0000 mg | ORAL_TABLET | Freq: Every day | ORAL | 3 refills | Status: DC

## 2018-08-25 ENCOUNTER — Encounter (HOSPITAL_COMMUNITY): Payer: Self-pay

## 2018-08-30 ENCOUNTER — Ambulatory Visit: Payer: Medicaid Other | Admitting: Family Medicine

## 2018-09-29 ENCOUNTER — Ambulatory Visit (INDEPENDENT_AMBULATORY_CARE_PROVIDER_SITE_OTHER): Payer: Self-pay | Admitting: Family Medicine

## 2018-09-29 VITALS — BP 120/52 | HR 100 | Temp 98.3°F | Wt 154.0 lb

## 2018-09-29 DIAGNOSIS — F17200 Nicotine dependence, unspecified, uncomplicated: Secondary | ICD-10-CM

## 2018-09-29 DIAGNOSIS — R319 Hematuria, unspecified: Secondary | ICD-10-CM

## 2018-09-29 DIAGNOSIS — Z23 Encounter for immunization: Secondary | ICD-10-CM

## 2018-09-29 DIAGNOSIS — R300 Dysuria: Secondary | ICD-10-CM

## 2018-09-29 DIAGNOSIS — J302 Other seasonal allergic rhinitis: Secondary | ICD-10-CM

## 2018-09-29 DIAGNOSIS — N390 Urinary tract infection, site not specified: Secondary | ICD-10-CM

## 2018-09-29 LAB — POCT URINALYSIS DIP (MANUAL ENTRY)
Glucose, UA: NEGATIVE mg/dL
NITRITE UA: NEGATIVE
PROTEIN UA: NEGATIVE mg/dL
Spec Grav, UA: 1.025 (ref 1.010–1.025)
Urobilinogen, UA: 0.2 E.U./dL
pH, UA: 6 (ref 5.0–8.0)

## 2018-09-29 MED ORDER — FLUTICASONE PROPIONATE 50 MCG/ACT NA SUSP: 2.0000 | Freq: Every day | NASAL | 1 refills | Status: DC

## 2018-09-29 MED ORDER — CEPHALEXIN 500 MG PO CAPS: 500.0000 mg | ORAL_CAPSULE | Freq: Four times a day (QID) | ORAL | 0 refills | Status: DC

## 2018-09-29 MED ORDER — BUPROPION HCL ER (XL) 150 MG PO TB24: ORAL_TABLET | ORAL | 1 refills | Status: DC

## 2018-09-29 NOTE — Patient Instructions (Addendum)
Thank you for coming to see me today. It was a pleasure! Today we talked about:   Your likely urinary tract infection. Please take keflex twice daily for 7 days and discuss wit your regular doctor about a drug to prevent so many UTI's.   For smoking cessation, I have sent wellbutrin to your pharmacy, this will also help with your depression. You may take it with your zoloft. Please take 150mg  once daily for 3 days and then you may increase to twice daily.   Please follow-up with your regular doctor in 4-6 weeks after starting wellbutrin or sooner as needed.  If you have any questions or concerns, please do not hesitate to call the office at [phone].  Take Care,   [place] Syrena Burges, DO   Urinary Tract Infection, Adult A urinary tract infection (UTI) is an infection of any part of the urinary tract. The urinary tract includes:  The kidneys.  The ureters.  The bladder.  The urethra. These organs make, store, and get rid of pee (urine) in the body. What are the causes? This is caused by germs (bacteria) in your genital area. These germs grow and cause swelling (inflammation) of your urinary tract. What increases the risk? You are more likely to develop this condition if:  You have a small, thin tube (catheter) to drain pee.  You cannot control when you pee or poop (incontinence).  You are female, and: ? You use these methods to prevent pregnancy: ? A medicine that kills sperm (spermicide). ? A device that blocks sperm (diaphragm). ? You have low levels of a female hormone (estrogen). ? You are pregnant.  You have genes that add to your risk.  You are sexually active.  You take antibiotic medicines.  You have trouble peeing because of: ? A prostate that is bigger than normal, if you are female. ? A blockage in the part of your body that drains pee from the bladder (urethra). ? A kidney stone. ? A nerve condition that affects your bladder (neurogenic bladder). ? Not  getting enough to drink. ? Not peeing often enough.  You have other conditions, such as: ? Diabetes. ? A weak disease-fighting system (immune system). ? Sickle cell disease. ? Gout. ? Injury of the spine. What are the signs or symptoms? Symptoms of this condition include:  Needing to pee right away (urgently).  Peeing often.  Peeing small amounts often.  Pain or burning when peeing.  Blood in the pee.  Pee that smells bad or not like normal.  Trouble peeing.  Pee that is cloudy.  Fluid coming from the vagina, if you are female.  Pain in the belly or lower back. Other symptoms include:  Throwing up (vomiting).  No urge to eat.  Feeling mixed up (confused).  Being tired and grouchy (irritable).  A fever.  Watery poop (diarrhea). How is this treated? This condition may be treated with:  Antibiotic medicine.  Other medicines.  Drinking enough water. Follow these instructions at home:  Medicines  Take over-the-counter and prescription medicines only as told by your doctor.  If you were prescribed an antibiotic medicine, take it as told by your doctor. Do not stop taking it even if you start to feel better. General instructions  Make sure you: ? Pee until your bladder is empty. ? Do not hold pee for a long time. ? Empty your bladder after sex. ? Wipe from front to back after pooping if you are a female. Use each  tissue one time when you wipe.  Drink enough fluid to keep your pee pale yellow.  Keep all follow-up visits as told by your doctor. This is important. Contact a doctor if:  You do not get better after 1-2 days.  Your symptoms go away and then come back. Get help right away if:  You have very bad back pain.  You have very bad pain in your lower belly.  You have a fever.  You are sick to your stomach (nauseous).  You are throwing up. Summary  A urinary tract infection (UTI) is an infection of any part of the urinary  tract.  This condition is caused by germs in your genital area.  There are many risk factors for a UTI. These include having a small, thin tube to drain pee and not being able to control when you pee or poop.  Treatment includes antibiotic medicines for germs.  Drink enough fluid to keep your pee pale yellow. This information is not intended to replace advice given to you by your health care provider. Make sure you discuss any questions you have with your health care provider. Document Released: 03/08/2008 Document Revised: 03/30/2018 Document Reviewed: 03/30/2018 Elsevier Interactive Patient Education  2019 [place].  Bupropion extended-release tablets (Depression/Mood Disorders) What is this medicine? BUPROPION (byoo PROE pee on) is used to treat depression. This medicine may be used for other purposes; ask your health care provider or pharmacist if you have questions. COMMON BRAND NAME(S): Aplenzin, Budeprion XL, Forfivo XL, Wellbutrin XL What should I tell my health care provider before I take this medicine? They need to know if you have any of these conditions: -an eating disorder, such as anorexia or bulimia -bipolar disorder or psychosis -diabetes or high blood sugar, treated with medication -glaucoma -head injury or brain tumor -heart disease, previous heart attack, or irregular heart beat -high blood pressure -kidney or liver disease -seizures (convulsions) -suicidal thoughts or a previous suicide attempt -Tourette's syndrome -weight loss -an unusual or allergic reaction to bupropion, other medicines, foods, dyes, or preservatives -breast-feeding -pregnant or trying to become pregnant How should I use this medicine? Take this medicine by mouth with a glass of water. Follow the directions on the prescription label. You can take it with or without food. If it upsets your stomach, take it with food. Do not crush, chew, or cut these tablets. This medicine is taken once  daily at the same time each day. Do not take your medicine more often than directed. Do not stop taking this medicine suddenly except upon the advice of your doctor. Stopping this medicine too quickly may cause serious side effects or your condition may worsen. A special MedGuide will be given to you by the pharmacist with each prescription and refill. Be sure to read this information carefully each time. Talk to your pediatrician regarding the use of this medicine in children. Special care may be needed. Overdosage: If you think you have taken too much of this medicine contact a poison control center or emergency room at once. NOTE: This medicine is only for you. Do not share this medicine with others. What if I miss a dose? If you miss a dose, skip the missed dose and take your next tablet at the regular time. Do not take double or extra doses. What may interact with this medicine? Do not take this medicine with any of the following medications: -linezolid -MAOIs like Azilect, Carbex, Eldepryl, Marplan, Nardil, and Parnate -methylene blue (injected into  a vein) -other medicines that contain bupropion like Zyban This medicine may also interact with the following medications: -alcohol -certain medicines for anxiety or sleep -certain medicines for blood pressure like metoprolol, propranolol -certain medicines for depression or psychotic disturbances -certain medicines for HIV or AIDS like efavirenz, lopinavir, nelfinavir, ritonavir -certain medicines for irregular heart beat like propafenone, flecainide -certain medicines for Parkinson's disease like amantadine, levodopa -certain medicines for seizures like carbamazepine, phenytoin, phenobarbital -cimetidine -clopidogrel -cyclophosphamide -digoxin -furazolidone -isoniazid -nicotine -orphenadrine -procarbazine -steroid medicines like prednisone or cortisone -stimulant medicines for attention disorders, weight loss, or to stay  awake -tamoxifen -theophylline -thiotepa -ticlopidine -tramadol -warfarin This list may not describe all possible interactions. Give your health care provider a list of all the medicines, herbs, non-prescription drugs, or dietary supplements you use. Also tell them if you smoke, drink alcohol, or use illegal drugs. Some items may interact with your medicine. What should I watch for while using this medicine? Tell your doctor if your symptoms do not get better or if they get worse. Visit your doctor or health care professional for regular checks on your progress. Because it may take several weeks to see the full effects of this medicine, it is important to continue your treatment as prescribed by your doctor. Patients and their families should watch out for new or worsening thoughts of suicide or depression. Also watch out for sudden changes in feelings such as feeling anxious, agitated, panicky, irritable, hostile, aggressive, impulsive, severely restless, overly excited and hyperactive, or not being able to sleep. If this happens, especially at the beginning of treatment or after a change in dose, call your health care professional. Avoid alcoholic drinks while taking this medicine. Drinking large amounts of alcoholic beverages, using sleeping or anxiety medicines, or quickly stopping the use of these agents while taking this medicine may increase your risk for a seizure. Do not drive or use heavy machinery until you know how this medicine affects you. This medicine can impair your ability to perform these tasks. Do not take this medicine close to bedtime. It may prevent you from sleeping. Your mouth may get dry. Chewing sugarless gum or sucking hard candy, and drinking plenty of water may help. Contact your doctor if the problem does not go away or is severe. The tablet shell for some brands of this medicine does not dissolve. This is normal. The tablet shell may appear whole in the stool. This is  not a cause for concern. What side effects may I notice from receiving this medicine? Side effects that you should report to your doctor or health care professional as soon as possible: -allergic reactions like skin rash, itching or hives, swelling of the face, lips, or tongue -breathing problems -changes in vision -confusion -elevated mood, decreased need for sleep, racing thoughts, impulsive behavior -fast or irregular heartbeat -hallucinations, loss of contact with reality -increased blood pressure -redness, blistering, peeling or loosening of the skin, including inside the mouth -seizures -suicidal thoughts or other mood changes -unusually weak or tired -vomiting Side effects that usually do not require medical attention (report to your doctor or health care professional if they continue or are bothersome): -constipation -headache -loss of appetite -nausea -tremors -weight loss This list may not describe all possible side effects. Call your doctor for medical advice about side effects. You may report side effects to FDA at 1-800-FDA-1088. Where should I keep my medicine? Keep out of the reach of children. Store at room temperature between 15 and 30 degrees  C (59 and 86 degrees F). Throw away any unused medicine after the expiration date. NOTE: This sheet is a summary. It may not cover all possible information. If you have questions about this medicine, talk to your doctor, pharmacist, or health care provider.  2019 Elsevier/Gold Standard (2016-03-12 13:55:13)

## 2018-09-29 NOTE — Progress Notes (Signed)
  Subjective:    Patient ID: Kim Ross, female    DOB: 1956/01/27, 62 y.o.   MRN: [id number]   CC: dysuria and increased frequency  HPI:  UTI She comes in stating she feels like she has a UTI, states that she gets them "all the time" this feels just like them.  States that she usually gets them every 6 months and symptoms usually go away after treated with Keflex..  Patient reports increased frequency, urgency, dysuria in the am x7 days. Patient also reports no intermittent flank pain. Did not report frank hematuria.  Denies fevers, chills, nausea, vomiting, or diarrhea.  Social: Patient reports that her 63yo daughter had an abortion on Dec 24 this year and she has had a lot of family issues. She would like to talk with someone in our office.   Smoking status reviewed- patient interested in smoking cessation and would like to try wellbutrin   ROS: 10 point ROS is otherwise negative, except as mentioned in HPI  Patient Active Problem List   Diagnosis Date Noted  . Seasonal allergies 12/30/2017  . Hyperlipidemia 12/30/2017  . Breast cancer screening 12/30/2017  . Screening for colon cancer 12/30/2017  . Right elbow pain 06/30/2017  . UTI (urinary tract infection) 02/09/2017  . Healthcare maintenance 09/30/2016  . Hematuria 04/01/2015  . History of recurrent UTIs 01/31/2014  . Vertigo, intermittent 06/27/2013  . Generalized anxiety disorder 04/10/2013  . Irritable bowel syndrome 10/19/2012  . 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 Tobacco use disorder 07/20/2012     Objective:  BP (!) 120/52   Pulse 100   Temp 98.3 F (36.8 C) (Oral)   Wt 154 lb (69.9 kg)   SpO2 97%   BMI 25.63 kg/m  Vitals and nursing note reviewed  General: NAD, pleasant Respiratory: normal effort Extremities: no edema or cyanosis. WWP. Skin: warm and dry, no rashes noted Neuro: alert and oriented, no focal deficits Psych: normal affect, normal thought content and judgement, no SI/HI  Assessment & Plan:     UTI (urinary tract infection) Patient reporting symptoms consistent with UTI (dysuria increased frequency urgency and odor).  UA positive for nitrites.  No CVA tenderness or fevers, less likely pyelonephritis.  Plan to give course of Keflex for 7 days.  Patient aware and agreeable with plan. -Keflex 500 mg 4 times daily x7 days -F/u urine culture -Follow-up if no improvement or worsening symptoms -Discuss prophylaxis with PCP as she has had >3 UTI's in the past year  Tobacco use disorder Started patient on Wellbutrin XL 150mg  once daily x3 days, and then 150mg  BID.  F/u with PCP in 4-6 weeks   Seasonal allergies Stable. Refilled flonase.   Received flu vaccine today.   [place] Trecia Maring, DO Family Medicine Resident PGY-2

## 2018-09-30 NOTE — Assessment & Plan Note (Signed)
Stable. Refilled flonase.

## 2018-09-30 NOTE — Assessment & Plan Note (Addendum)
Patient reporting symptoms consistent with UTI (dysuria increased frequency urgency and odor).  UA positive for nitrites.  No CVA tenderness or fevers, less likely pyelonephritis.  Plan to give course of Keflex for 7 days.  Patient aware and agreeable with plan. -Keflex 500 mg 4 times daily x7 days -F/u urine culture -Follow-up if no improvement or worsening symptoms -Discuss prophylaxis with PCP as she has had >3 UTI's in the past year

## 2018-09-30 NOTE — Assessment & Plan Note (Signed)
Started patient on Wellbutrin XL 150mg  once daily x3 days, and then 150mg  BID.  F/u with PCP in 4-6 weeks

## 2018-10-05 ENCOUNTER — Telehealth: Payer: Self-pay | Admitting: Licensed Clinical Social Worker

## 2018-10-05 NOTE — Telephone Encounter (Signed)
Patient left message returning your call. Call back is [phone].  Danley Danker, RN [facility] RN)

## 2018-10-05 NOTE — Progress Notes (Signed)
LCSW received message that patient returned phone call.  LCSw returned patient's call however she was at work and unable to talk.  Informed patient she could make an appointment to come in or call back during a time that worked for her. Provided patient with contact numbers.  Plan: LCSW will wait for patient to call.  Casimer Lanius, [facility]   [phone] 12:39 PM

## 2018-10-05 NOTE — Progress Notes (Signed)
Service : [place] consult   Consult from Dr. Enid Derry ref. Patient would like to discuss her social concerns . Also started patient on wellbutrin for smoking cessation as well as aspects of depression noted in chart.   Plan: LCSW will wait for return call, if no return call is received, will call in about 5 to 7 business days.  Casimer Lanius, [facility]   [phone] 9:28 AM

## 2018-10-24 ENCOUNTER — Other Ambulatory Visit: Payer: Self-pay | Admitting: Family Medicine

## 2018-10-24 MED ORDER — BUPROPION HCL ER (XL) 150 MG PO TB24: ORAL_TABLET | ORAL | 1 refills | Status: DC

## 2018-10-24 NOTE — Telephone Encounter (Signed)
Wellbutrin XL taken once a day- patient should take 2 tablets in the morning rather than BID dosing of XL version. Please confirm with patient how she is taking medication. Please relay instructions above.   Dorris Singh, MD  Family Medicine Teaching Service

## 2018-10-24 NOTE — Addendum Note (Signed)
Addended by: Esau Grew on: 10/24/2018 02:07 PM   Modules accepted: Orders

## 2018-10-24 NOTE — Telephone Encounter (Signed)
Spoke with patient and asked her to take both tablets at the same time in the morning. Expressed understanding. Danley Danker, RN [facility] RN)

## 2018-10-25 ENCOUNTER — Ambulatory Visit: Payer: Medicaid Other

## 2018-10-26 ENCOUNTER — Ambulatory Visit (INDEPENDENT_AMBULATORY_CARE_PROVIDER_SITE_OTHER): Payer: Self-pay | Admitting: Family Medicine

## 2018-10-26 ENCOUNTER — Other Ambulatory Visit: Payer: Self-pay

## 2018-10-26 VITALS — BP 118/61 | HR 97 | Temp 97.8°F | Wt 156.0 lb

## 2018-10-26 DIAGNOSIS — R399 Unspecified symptoms and signs involving the genitourinary system: Secondary | ICD-10-CM

## 2018-10-26 LAB — POCT URINALYSIS DIP (MANUAL ENTRY)
Bilirubin, UA: NEGATIVE
Glucose, UA: NEGATIVE mg/dL
Ketones, POC UA: NEGATIVE mg/dL
Nitrite, UA: NEGATIVE
PROTEIN UA: NEGATIVE mg/dL
SPEC GRAV UA: 1.025 (ref 1.010–1.025)
UROBILINOGEN UA: 1 U/dL
pH, UA: 6 (ref 5.0–8.0)

## 2018-10-26 LAB — POCT UA - MICROSCOPIC ONLY

## 2018-10-26 MED ORDER — SULFAMETHOXAZOLE-TRIMETHOPRIM 800-160 MG PO TABS: 1.0000 | ORAL_TABLET | Freq: Two times a day (BID) | ORAL | 0 refills | Status: AC

## 2018-10-26 NOTE — Patient Instructions (Addendum)
  Please take the antibiotic twice a day for 7 days  We will check the urine culture, if we need to switch antibiotic I will call you.  If you develop fevers, flank pain, nausea or vomiting please return to be seen.  If you have questions or concerns please do not hesitate to call at [phone].  Lucila Maine, DO PGY-3, [facility] 10/26/2018 4:40 PM

## 2018-10-26 NOTE — Progress Notes (Signed)
    Subjective:    Patient ID: Kim Ross, female    DOB: November 10, 1955, 63 y.o.   MRN: [id number]   CC: UTI symptoms  HPI: patient reports she was seen last month for UTI and given 7 days of keflex however the symptoms never went away. She is experiencing burning, urgency, frequency. She denies abdominal pain or flank pain. She has no fevers or chills, no nausea or vomiting.   Smoking status reviewed- current smoker  Review of Systems- see HPI   Objective:  BP 118/61   Pulse 97   Temp 97.8 F (36.6 C) (Oral)   Wt 156 lb (70.8 kg)   SpO2 99%   BMI 25.96 kg/m  Vitals and nursing note reviewed  General: well appearing, in no acute distress HEENT: normocephalic, MMM Cardiac: RRR, clear S1 and S2, no murmurs, rubs, or gallops Respiratory:no increased work of breathing Abdomen: soft, nontender, no CVA tenderness Neuro: alert and oriented, no focal deficits   Assessment & Plan:    UTI symptoms  Patient with UTI symptoms and UA positive for leukocytes. Last treated with keflex, no culture data. Will send urine for culture and treat with bactrim BID for 7 days. Will call patient if culture data indicates we should switch antibiotics. Return precautions given for fever, flank pain. Patient verbalized understanding and agreement with plan.     Return if symptoms worsen or fail to improve.   Lucila Maine, DO Family Medicine Resident PGY-3

## 2018-10-26 NOTE — Assessment & Plan Note (Signed)
  Patient with UTI symptoms and UA positive for leukocytes. Last treated with keflex, no culture data. Will send urine for culture and treat with bactrim BID for 7 days. Will call patient if culture data indicates we should switch antibiotics. Return precautions given for fever, flank pain. Patient verbalized understanding and agreement with plan.

## 2018-10-31 ENCOUNTER — Other Ambulatory Visit: Payer: Self-pay | Admitting: Internal Medicine

## 2018-11-01 LAB — URINE CULTURE

## 2018-11-03 ENCOUNTER — Telehealth: Payer: Self-pay | Admitting: Family Medicine

## 2018-11-03 NOTE — Telephone Encounter (Signed)
  Called Kim Ross to check in, urine cx showed proteus pan sensitive. Wanted to ensure her symptoms resolved. Unable to reach her. Did not leave VM.   Kim Maine, DO PGY-3, [facility] 11/03/2018 3:15 PM

## 2018-11-07 ENCOUNTER — Other Ambulatory Visit: Payer: Self-pay

## 2018-11-07 MED ORDER — FLUTICASONE PROPIONATE 50 MCG/ACT NA SUSP: NASAL | 1 refills | Status: DC

## 2019-02-07 ENCOUNTER — Other Ambulatory Visit: Payer: Self-pay

## 2019-02-07 ENCOUNTER — Telehealth (INDEPENDENT_AMBULATORY_CARE_PROVIDER_SITE_OTHER): Payer: Self-pay | Admitting: Family Medicine

## 2019-02-07 DIAGNOSIS — R399 Unspecified symptoms and signs involving the genitourinary system: Secondary | ICD-10-CM

## 2019-02-07 MED ORDER — CEPHALEXIN 500 MG PO CAPS: 500.0000 mg | ORAL_CAPSULE | Freq: Four times a day (QID) | ORAL | 0 refills | Status: AC

## 2019-02-07 NOTE — Progress Notes (Signed)
Wt-159lb BP- N/A T- N/A
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
## 2019-03-24 ENCOUNTER — Other Ambulatory Visit: Payer: Self-pay | Admitting: Family Medicine

## 2019-07-10 ENCOUNTER — Ambulatory Visit: Payer: Medicaid Other

## 2019-07-12 ENCOUNTER — Ambulatory Visit (INDEPENDENT_AMBULATORY_CARE_PROVIDER_SITE_OTHER): Payer: Self-pay | Admitting: *Deleted

## 2019-07-12 ENCOUNTER — Ambulatory Visit: Payer: Medicaid Other

## 2019-07-12 ENCOUNTER — Other Ambulatory Visit: Payer: Self-pay

## 2019-07-12 DIAGNOSIS — Z23 Encounter for immunization: Secondary | ICD-10-CM

## 2019-07-12 NOTE — Progress Notes (Signed)
Pt tolerated vaccine well. Deseree Kennon Holter, CMA

## 2019-08-05 ENCOUNTER — Emergency Department (HOSPITAL_COMMUNITY): Payer: Medicare Other

## 2019-08-05 ENCOUNTER — Other Ambulatory Visit: Payer: Self-pay

## 2019-08-05 ENCOUNTER — Inpatient Hospital Stay (HOSPITAL_COMMUNITY)
Admission: EM | Admit: 2019-08-05 | Discharge: 2019-08-08 | DRG: 917 | Disposition: A | Discharge status: Home / Self Care | Payer: Medicare Other | Attending: Internal Medicine | Admitting: Internal Medicine

## 2019-08-05 ENCOUNTER — Encounter (HOSPITAL_COMMUNITY): Payer: Self-pay

## 2019-08-05 DIAGNOSIS — J690 Pneumonitis due to inhalation of food and vomit: Secondary | ICD-10-CM

## 2019-08-05 DIAGNOSIS — Z20828 Contact with and (suspected) exposure to other viral communicable diseases: Secondary | ICD-10-CM | POA: Diagnosis present

## 2019-08-05 DIAGNOSIS — Z114 Encounter for screening for human immunodeficiency virus [HIV]: Secondary | ICD-10-CM

## 2019-08-05 DIAGNOSIS — G92 Toxic encephalopathy: Secondary | ICD-10-CM | POA: Diagnosis present

## 2019-08-05 DIAGNOSIS — T405X1A Poisoning by cocaine, accidental (unintentional), initial encounter: Secondary | ICD-10-CM | POA: Diagnosis not present

## 2019-08-05 DIAGNOSIS — F1410 Cocaine abuse, uncomplicated: Secondary | ICD-10-CM

## 2019-08-05 DIAGNOSIS — Z79899 Other long term (current) drug therapy: Secondary | ICD-10-CM

## 2019-08-05 DIAGNOSIS — T50901A Poisoning by unspecified drugs, medicaments and biological substances, accidental (unintentional), initial encounter: Secondary | ICD-10-CM

## 2019-08-05 DIAGNOSIS — Z886 Allergy status to analgesic agent status: Secondary | ICD-10-CM

## 2019-08-05 DIAGNOSIS — Z885 Allergy status to narcotic agent status: Secondary | ICD-10-CM

## 2019-08-05 DIAGNOSIS — F19929 Other psychoactive substance use, unspecified with intoxication, unspecified: Secondary | ICD-10-CM

## 2019-08-05 DIAGNOSIS — R4182 Altered mental status, unspecified: Secondary | ICD-10-CM | POA: Diagnosis not present

## 2019-08-05 DIAGNOSIS — J9601 Acute respiratory failure with hypoxia: Secondary | ICD-10-CM | POA: Diagnosis present

## 2019-08-05 DIAGNOSIS — Z825 Family history of asthma and other chronic lower respiratory diseases: Secondary | ICD-10-CM

## 2019-08-05 DIAGNOSIS — F17210 Nicotine dependence, cigarettes, uncomplicated: Secondary | ICD-10-CM | POA: Diagnosis present

## 2019-08-05 DIAGNOSIS — Z809 Family history of malignant neoplasm, unspecified: Secondary | ICD-10-CM

## 2019-08-05 DIAGNOSIS — G9340 Encephalopathy, unspecified: Secondary | ICD-10-CM | POA: Diagnosis present

## 2019-08-05 HISTORY — DX: Pneumonitis due to inhalation of food and vomit: J69.0

## 2019-08-05 HISTORY — DX: Encounter for screening for human immunodeficiency virus (HIV): Z11.4

## 2019-08-05 HISTORY — DX: Other psychoactive substance use, unspecified with intoxication, unspecified: F19.929

## 2019-08-05 LAB — URINALYSIS, COMPLETE (UACMP) WITH MICROSCOPIC
Bilirubin Urine: NEGATIVE
Glucose, UA: >=500 mg/dL — AB
Ketones, ur: NEGATIVE mg/dL
Leukocytes,Ua: NEGATIVE
Nitrite: NEGATIVE
Protein, ur: 30 mg/dL — AB
Specific Gravity, Urine: 1.016 (ref 1.005–1.030)
pH: 5 (ref 5.0–8.0)

## 2019-08-05 LAB — CBC WITH DIFFERENTIAL/PLATELET
Abs Immature Granulocytes: 0.46 10*3/uL — ABNORMAL HIGH (ref 0.00–0.07)
Basophils Absolute: 0.1 10*3/uL (ref 0.0–0.1)
Basophils Relative: 0 %
Eosinophils Absolute: 0.4 10*3/uL (ref 0.0–0.5)
Eosinophils Relative: 2 %
HCT: 43.6 % (ref 36.0–46.0)
Hemoglobin: 13.6 g/dL (ref 12.0–15.0)
Immature Granulocytes: 2 %
Lymphocytes Relative: 25 %
Lymphs Abs: 5 10*3/uL — ABNORMAL HIGH (ref 0.7–4.0)
MCH: 30.2 pg (ref 26.0–34.0)
MCHC: 31.2 g/dL (ref 30.0–36.0)
MCV: 96.9 fL (ref 80.0–100.0)
Monocytes Absolute: 1.1 10*3/uL — ABNORMAL HIGH (ref 0.1–1.0)
Monocytes Relative: 6 %
Neutro Abs: 13.1 10*3/uL — ABNORMAL HIGH (ref 1.7–7.7)
Neutrophils Relative %: 65 %
Platelets: 307 10*3/uL (ref 150–400)
RBC: 4.5 MIL/uL (ref 3.87–5.11)
RDW: 14.9 % (ref 11.5–15.5)
WBC: 20.1 10*3/uL — ABNORMAL HIGH (ref 4.0–10.5)
nRBC: 0 % (ref 0.0–0.2)

## 2019-08-05 LAB — ETHANOL: Alcohol, Ethyl (B): <10 mg/dL (ref <10)

## 2019-08-05 LAB — COMPREHENSIVE METABOLIC PANEL
ALT: 14 U/L (ref 0–44)
AST: 16 U/L (ref 15–41)
Albumin: 3.8 g/dL (ref 3.5–5.0)
Alkaline Phosphatase: 160 U/L — ABNORMAL HIGH (ref 38–126)
Anion gap: 9 (ref 5–15)
BUN: 21 mg/dL (ref 8–23)
CO2: 24 mmol/L (ref 22–32)
Calcium: 8.4 mg/dL — ABNORMAL LOW (ref 8.9–10.3)
Chloride: 109 mmol/L (ref 98–111)
Creatinine, Ser: 0.7 mg/dL (ref 0.44–1.00)
GFR calc Af Amer: >60 mL/min (ref >60)
GFR calc non Af Amer: >60 mL/min (ref >60)
Glucose, Bld: 219 mg/dL — ABNORMAL HIGH (ref 70–99)
Potassium: 3.5 mmol/L (ref 3.5–5.1)
Sodium: 142 mmol/L (ref 135–145)
Total Bilirubin: 0.7 mg/dL (ref 0.3–1.2)
Total Protein: 7.1 g/dL (ref 6.5–8.1)

## 2019-08-05 LAB — SALICYLATE LEVEL: Salicylate Lvl: <7 mg/dL (ref 2.8–30.0)

## 2019-08-05 LAB — RAPID URINE DRUG SCREEN, HOSP PERFORMED
Amphetamines: NOT DETECTED
Barbiturates: NOT DETECTED
Benzodiazepines: NOT DETECTED
Cocaine: POSITIVE — AB
Opiates: NOT DETECTED
Tetrahydrocannabinol: NOT DETECTED

## 2019-08-05 LAB — CBG MONITORING, ED: Glucose-Capillary: 203 mg/dL — ABNORMAL HIGH (ref 70–99)

## 2019-08-05 LAB — AMMONIA: Ammonia: 25 umol/L (ref 9–35)

## 2019-08-05 LAB — ACETAMINOPHEN LEVEL: Acetaminophen (Tylenol), Serum: <10 ug/mL — ABNORMAL LOW (ref 10–30)

## 2019-08-05 MED ORDER — SODIUM CHLORIDE 0.9 % IV BOLUS
1000.0000 mL | Freq: Once | INTRAVENOUS | Status: AC
  Administered 2019-08-05: 19:00:00 1000 mL via INTRAVENOUS

## 2019-08-05 MED ORDER — SODIUM CHLORIDE 0.9 % IV SOLN
INTRAVENOUS | Status: DC
  Administered 2019-08-05 – 2019-08-06 (×3): via INTRAVENOUS

## 2019-08-05 MED ORDER — ONDANSETRON HCL 4 MG/2ML IJ SOLN
4.0000 mg | Freq: Four times a day (QID) | INTRAMUSCULAR | Status: DC | PRN
  Administered 2019-08-06: 04:00:00 4 mg via INTRAVENOUS
  Filled 2019-08-05: qty 2

## 2019-08-05 MED ORDER — ACETAMINOPHEN 650 MG RE SUPP: 650.0000 mg | Freq: Four times a day (QID) | RECTAL | Status: DC | PRN

## 2019-08-05 MED ORDER — SODIUM CHLORIDE 0.9 % IV SOLN
3.0000 g | Freq: Four times a day (QID) | INTRAVENOUS | Status: DC
  Administered 2019-08-06 – 2019-08-07 (×8): 3 g via INTRAVENOUS
  Filled 2019-08-05: qty 3
  Filled 2019-08-05: qty 8
  Filled 2019-08-05 (×3): qty 3
  Filled 2019-08-05: qty 8
  Filled 2019-08-05 (×4): qty 3
  Filled 2019-08-05: qty 8

## 2019-08-05 MED ORDER — ACETAMINOPHEN 325 MG PO TABS
650.0000 mg | ORAL_TABLET | Freq: Four times a day (QID) | ORAL | Status: DC | PRN
  Administered 2019-08-06: 12:00:00 650 mg via ORAL
  Filled 2019-08-05: qty 2

## 2019-08-05 MED ORDER — ONDANSETRON HCL 4 MG/2ML IJ SOLN
4.0000 mg | Freq: Once | INTRAMUSCULAR | Status: AC
  Administered 2019-08-05: 19:00:00 4 mg via INTRAVENOUS
  Filled 2019-08-05: qty 2

## 2019-08-05 MED ORDER — ENOXAPARIN SODIUM 40 MG/0.4ML ~~LOC~~ SOLN
40.0000 mg | Freq: Every day | SUBCUTANEOUS | Status: DC
  Administered 2019-08-06 – 2019-08-08 (×3): 40 mg via SUBCUTANEOUS
  Filled 2019-08-05 (×3): qty 0.4

## 2019-08-05 NOTE — ED Notes (Signed)
ED TO INPATIENT HANDOFF REPORT  Name/Age/Gender Kim Ross 63 y.o. female  Code Status   Home/SNF/Other Home  Chief Complaint AMS  Level of Care/Admitting Diagnosis ED Disposition    ED Disposition Condition [facility] Area: [place] [100102]  Level of Care: Telemetry [5]  Admit to tele based on following criteria: Complex arrhythmia (Bradycardia/Tachycardia)  Covid Evaluation: Person Under Investigation (PUI)  Diagnosis: Cocaine intoxication [facility]) [[id number]]  Admitting Physician: Shela Leff [[id number]]  Attending Physician: Shela Leff [[id number]]  PT Class (Do Not Modify): Observation [104]  PT Acc Code (Do Not Modify): Observation [10022]       Medical History Past Medical History:  Diagnosis Date  . Anxiety   . Depression   . History of recurrent UTIs     Allergies Allergies  Allergen Reactions  . Ibuprofen   . Trazodone And Nefazodone     AM Dizziness & Drowsiness     IV Location/Drains/Wounds Patient Lines/Drains/Airways Status   Active Line/Drains/Airways    Name:   Placement date:   Placement time:   Site:   Days:   Peripheral IV 08/05/19 Left;Lateral Forearm   08/05/19    1745    Forearm   less than 1          Labs/Imaging Results for orders placed or performed during the hospital encounter of 08/05/19 (from the past 48 hour(s))  Comprehensive metabolic panel     Status: Abnormal   Collection Time: 08/05/19  5:51 PM  Result Value Ref Range   Sodium 142 135 - 145 mmol/L   Potassium 3.5 3.5 - 5.1 mmol/L   Chloride 109 98 - 111 mmol/L   CO2 24 22 - 32 mmol/L   Glucose, Bld 219 (H) 70 - 99 mg/dL   BUN 21 8 - 23 mg/dL   Creatinine, Ser 0.70 0.44 - 1.00 mg/dL   Calcium 8.4 (L) 8.9 - 10.3 mg/dL   Total Protein 7.1 6.5 - 8.1 g/dL   Albumin 3.8 3.5 - 5.0 g/dL   AST 16 15 - 41 U/L   ALT 14 0 - 44 U/L   Alkaline Phosphatase 160 (H) 38 - 126 U/L   Total Bilirubin 0.7 0.3 - 1.2 mg/dL   GFR calc  non Af Amer >60 >60 mL/min   GFR calc Af Amer >60 >60 mL/min   Anion gap 9 5 - 15    Comment: Performed at [facility], [place] [address] [place], [place] [address]  CBC WITH DIFFERENTIAL     Status: Abnormal   Collection Time: 08/05/19  5:51 PM  Result Value Ref Range   WBC 20.1 (H) 4.0 - 10.5 K/uL   RBC 4.50 3.87 - 5.11 MIL/uL   Hemoglobin 13.6 12.0 - 15.0 g/dL   HCT 43.6 36.0 - 46.0 %   MCV 96.9 80.0 - 100.0 fL   MCH 30.2 26.0 - 34.0 pg   MCHC 31.2 30.0 - 36.0 g/dL   RDW 14.9 11.5 - 15.5 %   Platelets 307 150 - 400 K/uL   nRBC 0.0 0.0 - 0.2 %   Neutrophils Relative % 65 %   Neutro Abs 13.1 (H) 1.7 - 7.7 K/uL   Lymphocytes Relative 25 %   Lymphs Abs 5.0 (H) 0.7 - 4.0 K/uL   Monocytes Relative 6 %   Monocytes Absolute 1.1 (H) 0.1 - 1.0 K/uL   Eosinophils Relative 2 %   Eosinophils Absolute 0.4 0.0 - 0.5 K/uL  Basophils Relative 0 %   Basophils Absolute 0.1 0.0 - 0.1 K/uL   Immature Granulocytes 2 %   Abs Immature Granulocytes 0.46 (H) 0.00 - 0.07 K/uL    Comment: Performed at [facility], [place] [address] [place], [place] [address]  Ethanol     Status: None   Collection Time: 08/05/19  5:52 PM  Result Value Ref Range   Alcohol, Ethyl (B) <10 <10 mg/dL    Comment: (NOTE) Lowest detectable limit for serum alcohol is 10 mg/dL. For medical purposes only. Performed at [facility], [place] [address], [place], [place] [address]   Ammonia     Status: None   Collection Time: 08/05/19  5:52 PM  Result Value Ref Range   Ammonia 25 9 - 35 umol/L    Comment: Performed at [facility], [place] [address] [place], [place] [address]  Salicylate level     Status: None   Collection Time: 08/05/19  5:52 PM  Result Value Ref Range   Salicylate Lvl <0.9 2.8 - 30.0 mg/dL    Comment: Performed at [facility], [place] [address] [place], [place] [address]  Acetaminophen level     Status: Abnormal    Collection Time: 08/05/19  5:52 PM  Result Value Ref Range   Acetaminophen (Tylenol), Serum <10 (L) 10 - 30 ug/mL    Comment: (NOTE) Therapeutic concentrations vary significantly. A range of 10-30 ug/mL  may be an effective concentration for many patients. However, some  are best treated at concentrations outside of this range. Acetaminophen concentrations >150 ug/mL at 4 hours after ingestion  and >50 ug/mL at 12 hours after ingestion are often associated with  toxic reactions. Performed at [facility], [place] [address] [place], [place] [address]   CBG monitoring, ED     Status: Abnormal   Collection Time: 08/05/19  5:57 PM  Result Value Ref Range   Glucose-Capillary 203 (H) 70 - 99 mg/dL  Urinalysis, Complete w Microscopic     Status: Abnormal   Collection Time: 08/05/19  6:26 PM  Result Value Ref Range   Color, Urine YELLOW YELLOW   APPearance CLOUDY (A) CLEAR   Specific Gravity, Urine 1.016 1.005 - 1.030   pH 5.0 5.0 - 8.0   Glucose, UA >=500 (A) NEGATIVE mg/dL   Hgb urine dipstick MODERATE (A) NEGATIVE   Bilirubin Urine NEGATIVE NEGATIVE   Ketones, ur NEGATIVE NEGATIVE mg/dL   Protein, ur 30 (A) NEGATIVE mg/dL   Nitrite NEGATIVE NEGATIVE   Leukocytes,Ua NEGATIVE NEGATIVE   RBC / HPF 11-20 0 - 5 RBC/hpf   WBC, UA 0-5 0 - 5 WBC/hpf   Bacteria, UA FEW (A) NONE SEEN   Squamous Epithelial / LPF 0-5 0 - 5   Mucus PRESENT    Hyaline Casts, UA PRESENT     Comment: Performed at [facility], [place] [address], [place], [place] [address]  Urine rapid drug screen (hosp performed)     Status: Abnormal   Collection Time: 08/05/19  6:26 PM  Result Value Ref Range   Opiates NONE DETECTED NONE DETECTED   Cocaine POSITIVE (A) NONE DETECTED   Benzodiazepines NONE DETECTED NONE DETECTED   Amphetamines NONE DETECTED NONE DETECTED   Tetrahydrocannabinol NONE DETECTED NONE DETECTED   Barbiturates NONE DETECTED NONE DETECTED    Comment: (NOTE) DRUG  SCREEN FOR MEDICAL PURPOSES ONLY.  IF CONFIRMATION IS NEEDED FOR ANY PURPOSE, NOTIFY LAB WITHIN 5 DAYS.  LOWEST DETECTABLE LIMITS FOR URINE DRUG SCREEN Drug Class                     Cutoff (ng/mL) Amphetamine and metabolites    1000 Barbiturate and metabolites    200 Benzodiazepine                 716 Tricyclics and metabolites     300 Opiates and metabolites        300 Cocaine and metabolites        300 THC                            50 Performed at [facility], [place] [address], [place], [place] [address]    Ct Head Wo Contrast  Result Date: 08/05/2019 CLINICAL DATA:  Her family (spouse) phoned EMS, telling them that pt. "drank a beer that might've had cocaine in it--she took one sip and passed out". He mentioned to EMS that there had been "a recent death in the family". EXAM: CT HEAD WITHOUT CONTRAST TECHNIQUE: Contiguous axial images were obtained from the base of the skull through the vertex without intravenous contrast. COMPARISON:  None. FINDINGS: Brain: No evidence of acute infarction, hemorrhage, hydrocephalus, extra-axial collection or mass lesion/mass effect. Subtle areas of white matter hypoattenuation noted consistent with minor chronic microvascular ischemic change. Vascular: No hyperdense vessel or unexpected calcification. Skull: Normal. Negative for fracture or focal lesion. Sinuses/Orbits: Globes and orbits are unremarkable. Mild sinus mucosal thickening. Nasal trumpet in place. Clear mastoid air cells. Other: None. IMPRESSION: 1. No acute intracranial abnormalities. 2. Minor chronic microvascular ischemic change. Electronically Signed   By: Lajean Manes M.D.   On: 08/05/2019 19:30   Dg Chest Port 1 View  Result Date: 08/05/2019 CLINICAL DATA:  Altered level of consciousness. EXAM: PORTABLE CHEST 1 VIEW COMPARISON:  12/24/2015 FINDINGS: Heart size is normal. Diffuse pulmonary interstitial prominence is seen, suspicious for interstitial edema or  pneumonitis. No evidence of pulmonary consolidation or pleural effusion. IMPRESSION: Diffuse pulmonary interstitial prominence, suspicious for interstitial edema or pneumonitis. Electronically Signed   By: Marlaine Hind M.D.   On: 08/05/2019 18:40    Pending Labs Unresulted Labs (From admission, onward)    Start     Ordered   08/05/19 2026  SARS CORONAVIRUS 2 (TAT 6-24 HRS) Nasopharyngeal Nasopharyngeal Swab  (Asymptomatic/Tier 2 Patients Labs)  Once,   STAT    Question Answer Comment  Is this test for diagnosis or screening Screening   Symptomatic for COVID-19 as defined by CDC No   Hospitalized for COVID-19 No   Admitted to ICU for COVID-19 No   Previously tested for COVID-19 No   Resident in a congregate (group) care setting No   Employed in healthcare setting No   Pregnant No      08/05/19 2025          Vitals/Pain Today's Vitals   08/05/19 2115 08/05/19 2130 08/05/19 2145 08/05/19 2200  BP: 132/75 140/84 (!) 162/88 (!) 149/86  Pulse: 89 87 82 83  Resp: 16 17 16 16   Temp:      TempSrc:      SpO2: 97% 95% 97% 94%    Isolation Precautions Airborne and Contact precautions  Medications Medications  sodium chloride 0.9 % bolus 1,000 mL (0 mLs Intravenous Stopped 08/05/19 2046)    And  0.9 %  sodium chloride infusion ( Intravenous New Bag/Given 08/05/19 2046)  ondansetron (ZOFRAN) injection 4 mg (4 mg Intravenous Given 08/05/19 1847)    Mobility walks

## 2019-08-05 NOTE — ED Notes (Signed)
She consistently denies taking any medicine today other that "a B.C. powder". She has been incontinent of stool and as I write this she is being cleansed by our [place] and Evelena Peat.

## 2019-08-05 NOTE — ED Notes (Signed)
Visitor with patient wanted to make staff aware that patient is on several opiates.

## 2019-08-05 NOTE — Progress Notes (Signed)
Pharmacy Antibiotic Note  Kim Ross is a 63 y.o. female with altered mental status admitted on 08/05/2019 with aspiration pneumonia.  Pharmacy has been consulted for unasyn dosing.  Plan: Unasyn 3 Gm IV q6h F/u scr/cultures     Temp (24hrs), Avg:97.6 F (36.4 C), Min:97.6 F (36.4 C), Max:97.6 F (36.4 C)  Recent Labs  Lab 08/05/19 1751  WBC 20.1*  CREATININE 0.70    CrCl cannot be calculated (Unknown ideal weight.).    Allergies  Allergen Reactions  . Ibuprofen   . Trazodone And Nefazodone     AM Dizziness & Drowsiness     Antimicrobials this admission: 11/2 unasyn >>    >>   Dose adjustments this admission:   Microbiology results:  BCx:   UCx:    Sputum:    MRSA PCR:  Thank you for allowing pharmacy to be a part of this patient's care.  Dorrene German 08/05/2019 11:18 PM
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
## 2019-08-05 NOTE — H&P (Signed)
History and Physical    ZAKYRIA METZINGER ZSW:[id number] DOB: 09/19/1956 DOA: 08/05/2019  PCP: Daisy Floro, DO Patient coming from: Home  Chief Complaint: Altered mental status  HPI: Kim Ross is a 63 y.o. female with medical history significant of anxiety, depression presenting to the ED for evaluation of altered mental status.  Per EMS report, patient spouse told them that the patient had a beer that might have had cocaine in it.  She took a sip and passed out.  He reported to EMS that there had been a recent death in the family.  Upon arrival of EMS, patient was found to be very lethargic with pinpoint pupils.  They saw some brown liquid that did not look like beer.  She became more alert after receiving a total of 4 mg IV Narcan.  Patient is currently somnolent.  Oriented to self only and recognizes has been at bedside.  States "I drank all kinds of fluids."  Denies drug use.  Denies chest pain, shortness of breath, or cough.  No additional history could be obtained from her.  Husband states patient was in her usual state of health.  He saw her when she returned from work and noticed that she was drinking beer.  He thinks someone might have mixed opiates in her beer.  He then went outside and when he returned to the house, patient was less responsive.  ED Course: Initially satting 96% on room air, subsequently dropped to 88%.  Placed on 4 L supplemental oxygen via nasal cannula.  Afebrile.  White blood cell count 20.1 with left shift.  Blood ethanol level <10.  Ammonia level normal.  Salicylate and acetaminophen levels not elevated.  UA not suggestive of infection.  UDS positive for cocaine.  SARS-CoV-2 test pending.  Chest x-ray showing diffuse pulmonary interstitial prominence, suspicious for interstitial edema or pneumonitis.  CT head negative for acute finding. Patient received Zofran and 1 L normal saline bolus.  Review of Systems:  All systems reviewed and apart from history of  presenting illness, are negative.  Past Medical History:  Diagnosis Date   Anxiety    Depression    History of recurrent UTIs     Past Surgical History:  Procedure Laterality Date   ABDOMINAL HYSTERECTOMY     HEMORRHOID SURGERY       reports that she has been smoking cigarettes. She has been smoking about 0.50 packs per day. She has never used smokeless tobacco. She reports current alcohol use of about 1.0 standard drinks of alcohol per week. She reports that she does not use drugs.  Allergies  Allergen Reactions   Ibuprofen    Trazodone And Nefazodone     AM Dizziness & Drowsiness     Family History  Problem Relation Age of Onset   Asthma Mother    Cancer Sister 33       Pancreatic    Prior to Admission medications   Medication Sig Start Date End Date Taking? Authorizing Provider  atorvastatin (LIPITOR) 40 MG tablet TAKE 1 TABLET BY MOUTH EVERY DAY 10/31/18   Daisy Floro, DO  buPROPion (WELLBUTRIN XL) 150 MG 24 hr tablet 150 mg twice daily 10/24/18   Martyn Malay, MD  fluticasone [facility]) 50 MCG/ACT nasal spray SPRAY 2 SPRAYS INTO EACH NOSTRIL EVERY DAY 03/26/19   Daisy Floro, DO  ibuprofen (ADVIL,MOTRIN) 600 MG tablet Take 1 tablet (600 mg total) by mouth every 8 (eight) hours as needed. Patient not  taking: Reported on 07/18/2018 07/15/17   Mayo, Pete Pelt, MD  ketoconazole (NIZORAL) 2 % cream APPLY TOPICALLY DAILY FOR THE NEXT 4 WEEKS. Patient not taking: Reported on 07/18/2018 09/10/15   Olam Idler, MD  meclizine (ANTIVERT) 25 MG tablet Take 1 tablet (25 mg total) by mouth 2 (two) times daily. 07/04/18   Daisy Floro, DO  Multiple Vitamins-Minerals (MULTIVITAMIN WITH MINERALS) tablet Take 1 tablet by mouth daily. 04/27/13   Olam Idler, MD  sertraline (ZOLOFT) 50 MG tablet Take 1 tablet (50 mg total) by mouth daily. 08/24/18   Daisy Floro, DO  sodium chloride (OCEAN) 0.65 % SOLN nasal spray Place 1 spray into both nostrils as  needed for congestion. Patient not taking: Reported on 07/18/2018 08/08/16   Delsa Grana, PA-C    Physical Exam: Vitals:   08/05/19 2130 08/05/19 2145 08/05/19 2200 08/05/19 2315  BP: 140/84 (!) 162/88 (!) 149/86 138/90  Pulse: 87 82 83 80  Resp: 17 16 16 12   Temp:    97.7 F (36.5 C)  TempSrc:    Axillary  SpO2: 95% 97% 94% 100%  Weight:    74.5 kg    Physical Exam  Constitutional: She appears well-developed and well-nourished. No distress.  HENT:  Head: Normocephalic.  Eyes: Pupils are equal, round, and reactive to light. Right eye exhibits no discharge. Left eye exhibits no discharge.  Neck: Neck supple.  Cardiovascular: Normal rate, regular rhythm and intact distal pulses.  Pulmonary/Chest: Effort normal. She has no wheezes.  On 4 L supplemental oxygen Equal air entry upon auscultation of anterior lung fields  Abdominal: Soft. Bowel sounds are normal. She exhibits no distension. There is no abdominal tenderness. There is no guarding.  Musculoskeletal:        General: No edema.  Neurological:  Oriented to self only, recognizes husband at bedside Somnolent but waking intermittently up on command. Moving all extremities on command.  No focal weakness.  Skin: Skin is warm and dry. She is not diaphoretic.     Labs on Admission: I have personally reviewed following labs and imaging studies  CBC: Recent Labs  Lab 08/05/19 1751  WBC 20.1*  NEUTROABS 13.1*  HGB 13.6  HCT 43.6  MCV 96.9  PLT 756   Basic Metabolic Panel: Recent Labs  Lab 08/05/19 1751  NA 142  K 3.5  CL 109  CO2 24  GLUCOSE 219*  BUN 21  CREATININE 0.70  CALCIUM 8.4*   GFR: CrCl cannot be calculated (Unknown ideal weight.). Liver Function Tests: Recent Labs  Lab 08/05/19 1751  AST 16  ALT 14  ALKPHOS 160*  BILITOT 0.7  PROT 7.1  ALBUMIN 3.8   No results for input(s): LIPASE, AMYLASE in the last 168 hours. Recent Labs  Lab 08/05/19 1752  AMMONIA 25   Coagulation Profile: No  results for input(s): INR, PROTIME in the last 168 hours. Cardiac Enzymes: No results for input(s): CKTOTAL, CKMB, CKMBINDEX, TROPONINI in the last 168 hours. BNP (last 3 results) No results for input(s): PROBNP in the last 8760 hours. HbA1C: No results for input(s): HGBA1C in the last 72 hours. CBG: Recent Labs  Lab 08/05/19 1757  GLUCAP 203*   Lipid Profile: No results for input(s): CHOL, HDL, LDLCALC, TRIG, CHOLHDL, LDLDIRECT in the last 72 hours. Thyroid Function Tests: No results for input(s): TSH, T4TOTAL, FREET4, T3FREE, THYROIDAB in the last 72 hours. Anemia Panel: No results for input(s): VITAMINB12, FOLATE, FERRITIN, TIBC, IRON, RETICCTPCT in the last  72 hours. Urine analysis:    Component Value Date/Time   COLORURINE YELLOW 08/05/2019 1826   APPEARANCEUR CLOUDY (A) 08/05/2019 1826   LABSPEC 1.016 08/05/2019 1826   PHURINE 5.0 08/05/2019 1826   GLUCOSEU >=500 (A) 08/05/2019 1826   HGBUR MODERATE (A) 08/05/2019 1826   BILIRUBINUR NEGATIVE 08/05/2019 1826   BILIRUBINUR negative 10/26/2018 1615   BILIRUBINUR NEG 09/30/2016 1438   KETONESUR NEGATIVE 08/05/2019 1826   PROTEINUR 30 (A) 08/05/2019 1826   UROBILINOGEN 1.0 10/26/2018 1615   UROBILINOGEN 1.0 10/30/2012 1123   NITRITE NEGATIVE 08/05/2019 1826   LEUKOCYTESUR NEGATIVE 08/05/2019 1826    Radiological Exams on Admission: Ct Head Wo Contrast  Result Date: 08/05/2019 CLINICAL DATA:  Her family (spouse) phoned EMS, telling them that pt. "drank a beer that might've had cocaine in it--she took one sip and passed out". He mentioned to EMS that there had been "a recent death in the family". EXAM: CT HEAD WITHOUT CONTRAST TECHNIQUE: Contiguous axial images were obtained from the base of the skull through the vertex without intravenous contrast. COMPARISON:  None. FINDINGS: Brain: No evidence of acute infarction, hemorrhage, hydrocephalus, extra-axial collection or mass lesion/mass effect. Subtle areas of white matter  hypoattenuation noted consistent with minor chronic microvascular ischemic change. Vascular: No hyperdense vessel or unexpected calcification. Skull: Normal. Negative for fracture or focal lesion. Sinuses/Orbits: Globes and orbits are unremarkable. Mild sinus mucosal thickening. Nasal trumpet in place. Clear mastoid air cells. Other: None. IMPRESSION: 1. No acute intracranial abnormalities. 2. Minor chronic microvascular ischemic change. Electronically Signed   By: Lajean Manes M.D.   On: 08/05/2019 19:30   Dg Chest Port 1 View  Result Date: 08/05/2019 CLINICAL DATA:  Altered level of consciousness. EXAM: PORTABLE CHEST 1 VIEW COMPARISON:  12/24/2015 FINDINGS: Heart size is normal. Diffuse pulmonary interstitial prominence is seen, suspicious for interstitial edema or pneumonitis. No evidence of pulmonary consolidation or pleural effusion. IMPRESSION: Diffuse pulmonary interstitial prominence, suspicious for interstitial edema or pneumonitis. Electronically Signed   By: Marlaine Hind M.D.   On: 08/05/2019 18:40    EKG: Independently reviewed.  Sinus rhythm, no prior tracing for comparison.  Assessment/Plan Principal Problem:   Drug intoxication ([place]) Active Problems:   Aspiration pneumonitis ([place])   Acute respiratory failure with hypoxia ([place])   Encounter for screening for HIV   Altered mental status secondary to drug intoxication Initially found to have pinpoint pupils by EMS and became more responsive after receiving Narcan.  However, UDS positive for cocaine only.  Not tachycardic or hypertensive.  Blood ethanol level <10.  Ammonia level normal.  Salicylate and acetaminophen levels not elevated.  UA not suggestive of infection.  Currently somnolent and oriented to self only.  Does wake up intermittently and follow commands.  Recognizes husband at bedside.  Head CT negative for acute finding.   -Admit for observation -Cardiac monitoring -IV fluid hydration -Social work consult to help with  substance abuse -Unclear whether this was a suicide attempt.  Does have a history of anxiety and depression.  Unable to obtain history from the patient at this time. Obtain additional history in the morning when patient is more awake and alert.  Acute hypoxic respiratory failure secondary to drug intoxication and suspected aspiration pneumonitis Initially satting 96% on room air, subsequently dropped to 88%.  Placed on 4 L supplemental oxygen via nasal cannula.  Afebrile.  White blood cell count 20.1 with left shift. Chest x-ray showing diffuse pulmonary interstitial prominence, suspicious for interstitial edema or pneumonitis. -  Unasyn -Check procalcitonin level -Check BNP -SARS-CoV-2 test pending -Continue to monitor white blood cell count -Continuous pulse ox, supplemental oxygen as needed to keep oxygen saturation above 92%  HIV screening The patient falls between the ages of 13-64 and should be screened for HIV, therefore HIV testing ordered.  DVT prophylaxis: Lovenox Code Status: Full code Family Communication: Husband at bedside. Disposition Plan: Anticipate discharge after clinical improvement. Consults called: None Admission status: It is my clinical opinion that referral for OBSERVATION is reasonable and necessary in this patient based on the above information provided. The aforementioned taken together are felt to place the patient at high risk for further clinical deterioration. However it is anticipated that the patient may be medically stable for discharge from the hospital within 24 to 48 hours.  The medical decision making on this patient was of high complexity and the patient is at high risk for clinical deterioration, therefore this is a level 3 visit.  Shela Leff MD [facility] Pager [phone]  If 7PM-7AM, please contact night-coverage [email or web address] Password [email or web address]  08/05/2019, 11:40 PM

## 2019-08-05 NOTE — ED Notes (Signed)
Pt transported to CT ?

## 2019-08-05 NOTE — ED Notes (Signed)
Nasal trumpet removed, Admitting MD at bedside, pt slow to answer questions, however, questions are answered appropriately.

## 2019-08-05 NOTE — ED Notes (Signed)
Called report to 5th floor, room changed during report. Will call 4th floor when bed assigned.

## 2019-08-05 NOTE — ED Provider Notes (Signed)
[facility] DEPT Provider Note   CSN: [id number] Arrival date & time: 08/05/19  1734     History   Chief Complaint Chief Complaint  Patient presents with  . Drug Overdose    HPI Kim Ross is a 63 y.o. female.     HPI Patient presents the ED for evaluation of altered mental status.  EMS reports they initially were called for decreased responsiveness.  Patient's husband told EMS that she may have had a beer that had some cocaine in it.  They saw some brown liquid but did not look like beer.  Patient had pinpoint pupils and was given Narcan.  Patient's mental status improved following that but she began having nausea and vomiting.  Patient states she has a mild headache.  She is denying any trouble with chest pain or abdominal pain.  She denies any recent fevers or chills.  No cough or shortness of breath.  Patient is very diaphoretic but states that is from her menopause. Past Medical History:  Diagnosis Date  . Anxiety   . Depression   . History of recurrent UTIs     Patient Active Problem List   Diagnosis Date Noted  . UTI symptoms 10/26/2018  . Seasonal allergies 12/30/2017  . Hyperlipidemia 12/30/2017  . Breast cancer screening 12/30/2017  . Screening for colon cancer 12/30/2017  . Right elbow pain 06/30/2017  . UTI (urinary tract infection) 02/09/2017  . Healthcare maintenance 09/30/2016  . Hematuria 04/01/2015  . History of recurrent UTIs 01/31/2014  . Vertigo, intermittent 06/27/2013  . Generalized anxiety disorder 04/10/2013  . Irritable bowel syndrome 10/19/2012  . 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 Tobacco use disorder 07/20/2012    Past Surgical History:  Procedure Laterality Date  . ABDOMINAL HYSTERECTOMY    . HEMORRHOID SURGERY       OB History    Gravida  1   Para      Term      Preterm      AB      Living  1     SAB      TAB      Ectopic      Multiple      Live Births  1            Home  Medications    Prior to Admission medications   Medication Sig Start Date End Date Taking? Authorizing Provider  atorvastatin (LIPITOR) 40 MG tablet TAKE 1 TABLET BY MOUTH EVERY DAY 10/31/18   Daisy Floro, DO  buPROPion (WELLBUTRIN XL) 150 MG 24 hr tablet 150 mg twice daily 10/24/18   Martyn Malay, MD  fluticasone [facility]) 50 MCG/ACT nasal spray SPRAY 2 SPRAYS INTO EACH NOSTRIL EVERY DAY 03/26/19   Daisy Floro, DO  ibuprofen (ADVIL,MOTRIN) 600 MG tablet Take 1 tablet (600 mg total) by mouth every 8 (eight) hours as needed. Patient not taking: Reported on 07/18/2018 07/15/17   Mayo, Pete Pelt, MD  ketoconazole (NIZORAL) 2 % cream APPLY TOPICALLY DAILY FOR THE NEXT 4 WEEKS. Patient not taking: Reported on 07/18/2018 09/10/15   Olam Idler, MD  meclizine (ANTIVERT) 25 MG tablet Take 1 tablet (25 mg total) by mouth 2 (two) times daily. 07/04/18   Daisy Floro, DO  Multiple Vitamins-Minerals (MULTIVITAMIN WITH MINERALS) tablet Take 1 tablet by mouth daily. 04/27/13   Olam Idler, MD  sertraline (ZOLOFT) 50 MG tablet Take 1 tablet (50 mg total) by mouth daily.  08/24/18   Daisy Floro, DO  sodium chloride (OCEAN) 0.65 % SOLN nasal spray Place 1 spray into both nostrils as needed for congestion. Patient not taking: Reported on 07/18/2018 08/08/16   Delsa Grana, PA-C    Family History Family History  Problem Relation Age of Onset  . Asthma Mother   . Cancer Sister 19       Pancreatic    Social History Social History   Tobacco Use  . Smoking status: Current Every Day Smoker    Packs/day: 0.50    Types: Cigarettes  . Smokeless tobacco: Never Used  Substance Use Topics  . Alcohol use: Yes    Alcohol/week: 1.0 standard drinks    Types: 1 Cans of beer per week  . Drug use: No     Allergies   Ibuprofen and Trazodone and nefazodone   Review of Systems Review of Systems  All other systems reviewed and are negative.    Physical Exam Updated Vital  Signs BP 138/77   Pulse 87   Temp 97.6 F (36.4 C) (Oral)   Resp 14   SpO2 91%   Physical Exam Vitals signs and nursing note reviewed.  Constitutional:      General: She is not in acute distress.    Appearance: She is well-developed. She is ill-appearing.  HENT:     Head: Normocephalic and atraumatic.     Right Ear: External ear normal.     Left Ear: External ear normal.  Eyes:     General: No scleral icterus.       Right eye: No discharge.        Left eye: No discharge.     Conjunctiva/sclera: Conjunctivae normal.  Neck:     Musculoskeletal: Neck supple.     Trachea: No tracheal deviation.  Cardiovascular:     Rate and Rhythm: Normal rate and regular rhythm.  Pulmonary:     Effort: Pulmonary effort is normal. No respiratory distress.     Breath sounds: Normal breath sounds. No stridor. No wheezing or rales.  Abdominal:     General: Bowel sounds are normal. There is no distension.     Palpations: Abdomen is soft.     Tenderness: There is no abdominal tenderness. There is no guarding or rebound.     Comments: Vomiting  Musculoskeletal:        General: No tenderness.  Skin:    General: Skin is warm.     Findings: No rash.     Comments: Diaphoretic  Neurological:     Mental Status: She is alert.     Cranial Nerves: No cranial nerve deficit (no facial droop, extraocular movements intact, no slurred speech).     Sensory: No sensory deficit.     Motor: No abnormal muscle tone or seizure activity.     Coordination: Coordination normal.     Comments: Patient's alert and oriented to person place and time, she is somewhat slow to answer questions but does answer appropriately and follows commands      ED Treatments / Results  Labs (all labs ordered are listed, but only abnormal results are displayed) Labs Reviewed  COMPREHENSIVE METABOLIC PANEL - Abnormal; Notable for the following components:      Result Value   Glucose, Bld 219 (*)    Calcium 8.4 (*)    Alkaline  Phosphatase 160 (*)    All other components within normal limits  CBC WITH DIFFERENTIAL/PLATELET - Abnormal; Notable for the following components:  WBC 20.1 (*)    Neutro Abs 13.1 (*)    Lymphs Abs 5.0 (*)    Monocytes Absolute 1.1 (*)    Abs Immature Granulocytes 0.46 (*)    All other components within normal limits  URINALYSIS, COMPLETE (UACMP) WITH MICROSCOPIC - Abnormal; Notable for the following components:   APPearance CLOUDY (*)    Glucose, UA >=500 (*)    Hgb urine dipstick MODERATE (*)    Protein, ur 30 (*)    Bacteria, UA FEW (*)    All other components within normal limits  RAPID URINE DRUG SCREEN, HOSP PERFORMED - Abnormal; Notable for the following components:   Cocaine POSITIVE (*)    All other components within normal limits  ACETAMINOPHEN LEVEL - Abnormal; Notable for the following components:   Acetaminophen (Tylenol), Serum <10 (*)    All other components within normal limits  CBG MONITORING, ED - Abnormal; Notable for the following components:   Glucose-Capillary 203 (*)    All other components within normal limits  SARS CORONAVIRUS 2 (TAT 6-24 HRS)  ETHANOL  AMMONIA  SALICYLATE LEVEL    EKG EKG Interpretation  Date/Time:  Sunday August 05 2019 17:56:58 EST Ventricular Rate:  81 PR Interval:    QRS Duration: 73 QT Interval:  412 QTC Calculation: 479 R Axis:   68 Text Interpretation: Sinus rhythm Probable left atrial enlargement nonspecific t wave changes compared to prior tracing Confirmed by Dorie Rank [phone]) on 08/05/2019 6:15:26 PM   Radiology Ct Head Wo Contrast  Result Date: 08/05/2019 CLINICAL DATA:  Her family (spouse) phoned EMS, telling them that pt. "drank a beer that might've had cocaine in it--she took one sip and passed out". He mentioned to EMS that there had been "a recent death in the family". EXAM: CT HEAD WITHOUT CONTRAST TECHNIQUE: Contiguous axial images were obtained from the base of the skull through the vertex without  intravenous contrast. COMPARISON:  None. FINDINGS: Brain: No evidence of acute infarction, hemorrhage, hydrocephalus, extra-axial collection or mass lesion/mass effect. Subtle areas of white matter hypoattenuation noted consistent with minor chronic microvascular ischemic change. Vascular: No hyperdense vessel or unexpected calcification. Skull: Normal. Negative for fracture or focal lesion. Sinuses/Orbits: Globes and orbits are unremarkable. Mild sinus mucosal thickening. Nasal trumpet in place. Clear mastoid air cells. Other: None. IMPRESSION: 1. No acute intracranial abnormalities. 2. Minor chronic microvascular ischemic change. Electronically Signed   By: Lajean Manes M.D.   On: 08/05/2019 19:30   Dg Chest Port 1 View  Result Date: 08/05/2019 CLINICAL DATA:  Altered level of consciousness. EXAM: PORTABLE CHEST 1 VIEW COMPARISON:  12/24/2015 FINDINGS: Heart size is normal. Diffuse pulmonary interstitial prominence is seen, suspicious for interstitial edema or pneumonitis. No evidence of pulmonary consolidation or pleural effusion. IMPRESSION: Diffuse pulmonary interstitial prominence, suspicious for interstitial edema or pneumonitis. Electronically Signed   By: Marlaine Hind M.D.   On: 08/05/2019 18:40    Procedures Procedures (including critical care time)  Medications Ordered in ED Medications  sodium chloride 0.9 % bolus 1,000 mL (1,000 mLs Intravenous New Bag/Given 08/05/19 1842)    And  0.9 %  sodium chloride infusion (has no administration in time range)  ondansetron (ZOFRAN) injection 4 mg (4 mg Intravenous Given 08/05/19 1847)     Initial Impression / Assessment and Plan / ED Course  I have reviewed the triage vital signs and the nursing notes.  Pertinent labs & imaging results that were available during my care of the patient were reviewed  by me and considered in my medical decision making (see chart for details).  Clinical Course as of Aug 04 2025  Nancy Fetter Aug 05, 2019  1834 Patient  initially was vomiting and awake but is now sleeping and somnolent.  Suspicious for possible opiate overdose.  We will continue to monitor closely.   [JK]  2008 Patient's urine drug screen is positive for cocaine.  CBC notable for elevated white blood cell count.   [JK]  2008 No acute findings noted on head CT.   [JK]  2009 Chest x-ray suspicious for pneumonitis versus interstitial edema.   [JK]  2024 Pt remains somnolent although does wake up when spoken to.   [JK]    Clinical Course User Index [JK] Dorie Rank, MD     Patient presented with altered mental status, probable drug overdose.  EMS gave the patient Narcan with some improvement in her mental status.  When I first observed the patient she was diaphoretic and vomiting.  Patient has remained more somnolent since that time.  Patient's ED work-up is notable for an elevated white blood cell count, positive urine drug screen for cocaine and a chest x-ray showing interstitial edema versus pneumonitis.  No opiates on UDS.  Pt does require nasal cannula oxygen.  Still somnolent.  Will consult with medical service for admission, overnight observation.  Suspect sx related to overdose.  No mention of SI or depression by patient.  Final Clinical Impressions(s) / ED Diagnoses   Final diagnoses:  Cocaine abuse ([place])  Accidental drug overdose, initial encounter      Dorie Rank, MD 08/05/19 2033

## 2019-08-05 NOTE — ED Notes (Signed)
She is somnolent enough that I inserted a nasal trumpet airway into her right nare. She is on O2 at 4 l.p.m. monitor shows nsr without ectopy.

## 2019-08-05 NOTE — ED Notes (Addendum)
Went in to get pt set up for v/s and check cbg.  Pt "doesn't know" if she has had a bowel movement but there is a smell noted.  Pt's pants were removed and found to be soiled with feces. Pt agreed to [place] disposing of them, as well as her underwear. The pt was cleaned with the assistance of NT Jessica.  A purewick was placed on the pt, as well as a brief and all soiled chucks were changed out. Pt was repositioned in the bed, at which time she began gagging. The pt began vomiting and was provided with a basin.  Pt is resting upon me leaving from room.

## 2019-08-05 NOTE — ED Triage Notes (Signed)
Her family (spouse) phoned EMS, telling them that pt. "drank a beer that might've had cocaine in it--she took one sip and passed out". He mentioned to EMS that there had been "a recent death in the family". Upon their arrival, EMS found pt. To be very lethargic with pinpoint pupils. She was awakened by the administration of tow doses of 2mg  IV Narcan (total of 4 mg). Pt. Arrives drowsy and in no distress.

## 2019-08-05 NOTE — Plan of Care (Signed)
Initiated care plan 

## 2019-08-06 ENCOUNTER — Other Ambulatory Visit: Payer: Self-pay

## 2019-08-06 DIAGNOSIS — Z809 Family history of malignant neoplasm, unspecified: Secondary | ICD-10-CM | POA: Diagnosis not present

## 2019-08-06 DIAGNOSIS — Z825 Family history of asthma and other chronic lower respiratory diseases: Secondary | ICD-10-CM | POA: Diagnosis not present

## 2019-08-06 DIAGNOSIS — J9601 Acute respiratory failure with hypoxia: Secondary | ICD-10-CM | POA: Diagnosis present

## 2019-08-06 DIAGNOSIS — J690 Pneumonitis due to inhalation of food and vomit: Secondary | ICD-10-CM

## 2019-08-06 DIAGNOSIS — T405X1A Poisoning by cocaine, accidental (unintentional), initial encounter: Secondary | ICD-10-CM | POA: Diagnosis present

## 2019-08-06 DIAGNOSIS — F1410 Cocaine abuse, uncomplicated: Secondary | ICD-10-CM

## 2019-08-06 DIAGNOSIS — Z886 Allergy status to analgesic agent status: Secondary | ICD-10-CM | POA: Diagnosis not present

## 2019-08-06 DIAGNOSIS — Z885 Allergy status to narcotic agent status: Secondary | ICD-10-CM | POA: Diagnosis not present

## 2019-08-06 DIAGNOSIS — Z79899 Other long term (current) drug therapy: Secondary | ICD-10-CM | POA: Diagnosis not present

## 2019-08-06 DIAGNOSIS — G92 Toxic encephalopathy: Secondary | ICD-10-CM | POA: Diagnosis present

## 2019-08-06 DIAGNOSIS — F17210 Nicotine dependence, cigarettes, uncomplicated: Secondary | ICD-10-CM | POA: Diagnosis present

## 2019-08-06 DIAGNOSIS — R4182 Altered mental status, unspecified: Secondary | ICD-10-CM | POA: Diagnosis present

## 2019-08-06 DIAGNOSIS — Z20828 Contact with and (suspected) exposure to other viral communicable diseases: Secondary | ICD-10-CM | POA: Diagnosis present

## 2019-08-06 DIAGNOSIS — G9340 Encephalopathy, unspecified: Secondary | ICD-10-CM | POA: Diagnosis present

## 2019-08-06 LAB — URINALYSIS, ROUTINE W REFLEX MICROSCOPIC
Bilirubin Urine: NEGATIVE
Glucose, UA: NEGATIVE mg/dL
Ketones, ur: 20 mg/dL — AB
Nitrite: NEGATIVE
Protein, ur: NEGATIVE mg/dL
Specific Gravity, Urine: 1.018 (ref 1.005–1.030)
pH: 5 (ref 5.0–8.0)

## 2019-08-06 LAB — BASIC METABOLIC PANEL
Anion gap: 8 (ref 5–15)
BUN: 14 mg/dL (ref 8–23)
CO2: 22 mmol/L (ref 22–32)
Calcium: 8.1 mg/dL — ABNORMAL LOW (ref 8.9–10.3)
Chloride: 112 mmol/L — ABNORMAL HIGH (ref 98–111)
Creatinine, Ser: 0.71 mg/dL (ref 0.44–1.00)
GFR calc Af Amer: >60 mL/min (ref >60)
GFR calc non Af Amer: >60 mL/min (ref >60)
Glucose, Bld: 109 mg/dL — ABNORMAL HIGH (ref 70–99)
Potassium: 4.4 mmol/L (ref 3.5–5.1)
Sodium: 142 mmol/L (ref 135–145)

## 2019-08-06 LAB — CBC
HCT: 42.9 % (ref 36.0–46.0)
Hemoglobin: 13.1 g/dL (ref 12.0–15.0)
MCH: 30.3 pg (ref 26.0–34.0)
MCHC: 30.5 g/dL (ref 30.0–36.0)
MCV: 99.1 fL (ref 80.0–100.0)
Platelets: 273 10*3/uL (ref 150–400)
RBC: 4.33 MIL/uL (ref 3.87–5.11)
RDW: 15.3 % (ref 11.5–15.5)
WBC: 17.2 10*3/uL — ABNORMAL HIGH (ref 4.0–10.5)
nRBC: 0 % (ref 0.0–0.2)

## 2019-08-06 LAB — SARS CORONAVIRUS 2 (TAT 6-24 HRS): SARS Coronavirus 2: NEGATIVE

## 2019-08-06 LAB — HIV ANTIBODY (ROUTINE TESTING W REFLEX): HIV Screen 4th Generation wRfx: NONREACTIVE

## 2019-08-06 LAB — MAGNESIUM: Magnesium: 2 mg/dL (ref 1.7–2.4)

## 2019-08-06 LAB — PROCALCITONIN: Procalcitonin: 0.28 ng/mL

## 2019-08-06 LAB — STREP PNEUMONIAE URINARY ANTIGEN: Strep Pneumo Urinary Antigen: NEGATIVE

## 2019-08-06 LAB — BRAIN NATRIURETIC PEPTIDE: B Natriuretic Peptide: 52.1 pg/mL (ref 0.0–100.0)

## 2019-08-06 MED ORDER — FUROSEMIDE 10 MG/ML IJ SOLN: 20.0000 mg | Freq: Once | INTRAMUSCULAR | Status: DC

## 2019-08-06 MED ORDER — PROMETHAZINE HCL 25 MG/ML IJ SOLN
12.5000 mg | Freq: Once | INTRAMUSCULAR | Status: AC
  Administered 2019-08-06: 06:00:00 12.5 mg via INTRAVENOUS
  Filled 2019-08-06: qty 1

## 2019-08-06 NOTE — Progress Notes (Addendum)
PROGRESS NOTE    Kim Ross  QIO:[id number] DOB: 1955-10-17 DOA: 08/05/2019 PCP: Daisy Floro, DO    Brief Narrative:  HPI per Dr. Otelia Santee Davia is a 63 y.o. female with medical history significant of anxiety, depression presenting to the ED for evaluation of altered mental status.  Per EMS report, patient spouse told them that the patient had a beer that might have had cocaine in it.  She took a sip and passed out.  He reported to EMS that there had been a recent death in the family.  Upon arrival of EMS, patient was found to be very lethargic with pinpoint pupils.  They saw some brown liquid that did not look like beer.  She became more alert after receiving a total of 4 mg IV Narcan.  Patient is currently somnolent.  Oriented to self only and recognizes has been at bedside.  States "I drank all kinds of fluids."  Denies drug use.  Denies chest pain, shortness of breath, or cough.  No additional history could be obtained from her.  Husband states patient was in her usual state of health.  He saw her when she returned from work and noticed that she was drinking beer.  He thinks someone might have mixed opiates in her beer.  He then went outside and when he returned to the house, patient was less responsive.  ED Course: Initially satting 96% on room air, subsequently dropped to 88%.  Placed on 4 L supplemental oxygen via nasal cannula.  Afebrile.  White blood cell count 20.1 with left shift.  Blood ethanol level <10.  Ammonia level normal.  Salicylate and acetaminophen levels not elevated.  UA not suggestive of infection.  UDS positive for cocaine.  SARS-CoV-2 test pending.  Chest x-ray showing diffuse pulmonary interstitial prominence, suspicious for interstitial edema or pneumonitis.  CT head negative for acute finding. Patient received Zofran and 1 L normal saline bolus.  Assessment & Plan:   Principal Problem:   Drug intoxication ([place]) Active Problems:   Aspiration  pneumonitis ([place])   Acute respiratory failure with hypoxia ([place])   Encounter for screening for HIV   1 acute metabolic encephalopathy secondary to drug intoxication Patient initially found to be less responsive noted to have pinpoint pupils by EMS and more responsive after receiving some Narcan.  UDS was positive for cocaine.  Patient not hypotensive not tachycardic.  Blood alcohol level was less than 10.  Ammonia level normal.  Salicylate level < 7.  Acetaminophen level < 10.  Urinalysis with trace leukocytes, nitrite negative, 6-10 WBCs.  Urine cultures pending.  Patient improved clinically.  Patient alert following commands and answering questions appropriately however patient unable to tell what happened.  Head CT obtained negative for any acute abnormalities.  Continue hydration with IV fluids.  Social work consulted for substance abuse.  Supportive care.  2.  Acute hypoxic respiratory failure secondary to drug intoxication and probable aspiration pneumonitis Patient noted to have initial sats of 96 on room air however subsequently dropped to 88% and patient placed on 4 L nasal cannula.  Patient afebrile.  Patient noted to have a leukocytosis with a white count of 20,000 with a left shift.  Chest x-ray which was done showed diffuse pulmonary interstitial prominence suspicious for interstitial edema versus pneumonitis.  BNP level within normal limits.  SARS-CoV-2 test negative.  Procalcitonin level at 0.28.  Check a urine Legionella antigen.  Check a urine strep pneumococcus antigen.  Check blood  cultures x2. Patient placed empirically on IV Unasyn which we will continue for now.  3.  Leukocytosis Likely secondary to problem #2.  Panculture.  Continue empiric IV Unasyn.  Follow.  4.  Polysubstance abuse Social work consulted.  Polysubstance cessation stressed to patient.  DVT prophylaxis: Lovenox Code Status: Full Family Communication: Updated patient.  No family at bedside. Disposition Plan:  Likely home when clinically improved, no longer hypoxic and work-up completed.   Consultants:   None  Procedures:   CT head 08/05/2019  Chest x-ray 08/05/2019  Antimicrobials:   Unasyn 08/05/2019   Subjective: Patient state had an episode of emesis this morning.  Patient with some nausea.  Patient asking for diet.  Denies any chest pain or shortness of breath.  Denies any ongoing drug use.  Objective: Vitals:   08/05/19 2145 08/05/19 2200 08/05/19 2315 08/06/19 0344  BP: (!) 162/88 (!) 149/86 138/90 (!) 151/83  Pulse: 82 83 80 75  Resp: 16 16 12    Temp:   97.7 F (36.5 C) 98.2 F (36.8 C)  TempSrc:   Axillary Oral  SpO2: 97% 94% 100% 100%  Weight:   74.5 kg     Intake/Output Summary (Last 24 hours) at 08/06/2019 1133 Last data filed at 08/06/2019 0559 Gross per 24 hour  Intake 1026.67 ml  Output 300 ml  Net 726.67 ml   Filed Weights   08/05/19 2315  Weight: 74.5 kg    Examination:  General exam: Appears calm and comfortable  Respiratory system: Some scattered coarse breath sounds otherwise clear.  No wheezing noted.  Speaking in full sentences. Respiratory effort normal. Cardiovascular system: S1 & S2 heard, RRR. No JVD, murmurs, rubs, gallops or clicks. No pedal edema. Gastrointestinal system: Abdomen is nondistended, soft and nontender. No organomegaly or masses felt. Normal bowel sounds heard. Central nervous system: Alert and oriented. No focal neurological deficits. Extremities: Symmetric 5 x 5 power. Skin: No rashes, lesions or ulcers Psychiatry: Judgement and insight appear normal. Mood & affect appropriate.     Data Reviewed: I have personally reviewed following labs and imaging studies  CBC: Recent Labs  Lab 08/05/19 1751 08/06/19 0714  WBC 20.1* 17.2*  NEUTROABS 13.1*  --   HGB 13.6 13.1  HCT 43.6 42.9  MCV 96.9 99.1  PLT 307 828   Basic Metabolic Panel: Recent Labs  Lab 08/05/19 1751  NA 142  K 3.5  CL 109  CO2 24  GLUCOSE 219*   BUN 21  CREATININE 0.70  CALCIUM 8.4*   GFR: CrCl cannot be calculated (Unknown ideal weight.). Liver Function Tests: Recent Labs  Lab 08/05/19 1751  AST 16  ALT 14  ALKPHOS 160*  BILITOT 0.7  PROT 7.1  ALBUMIN 3.8   No results for input(s): LIPASE, AMYLASE in the last 168 hours. Recent Labs  Lab 08/05/19 1752  AMMONIA 25   Coagulation Profile: No results for input(s): INR, PROTIME in the last 168 hours. Cardiac Enzymes: No results for input(s): CKTOTAL, CKMB, CKMBINDEX, TROPONINI in the last 168 hours. BNP (last 3 results) No results for input(s): PROBNP in the last 8760 hours. HbA1C: No results for input(s): HGBA1C in the last 72 hours. CBG: Recent Labs  Lab 08/05/19 1757  GLUCAP 203*   Lipid Profile: No results for input(s): CHOL, HDL, LDLCALC, TRIG, CHOLHDL, LDLDIRECT in the last 72 hours. Thyroid Function Tests: No results for input(s): TSH, T4TOTAL, FREET4, T3FREE, THYROIDAB in the last 72 hours. Anemia Panel: No results for input(s): VITAMINB12, FOLATE,  FERRITIN, TIBC, IRON, RETICCTPCT in the last 72 hours. Sepsis Labs: Recent Labs  Lab 08/05/19 2351  PROCALCITON 0.28    Recent Results (from the past 240 hour(s))  SARS CORONAVIRUS 2 (TAT 6-24 HRS) Nasopharyngeal Nasopharyngeal Swab     Status: None   Collection Time: 08/05/19  8:50 PM   Specimen: Nasopharyngeal Swab  Result Value Ref Range Status   SARS Coronavirus 2 NEGATIVE NEGATIVE Final    Comment: (NOTE) SARS-CoV-2 target nucleic acids are NOT DETECTED. The SARS-CoV-2 RNA is generally detectable in upper and lower respiratory specimens during the acute phase of infection. Negative results do not preclude SARS-CoV-2 infection, do not rule out co-infections with other pathogens, and should not be used as the sole basis for treatment or other patient management decisions. Negative results must be combined with clinical observations, patient history, and epidemiological information. The  expected result is Negative. Fact Sheet for Patients: SugarRoll.be Fact Sheet for Healthcare Providers: https://www.woods-mathews.com/ This test is not yet approved or cleared by the [place] FDA and  has been authorized for detection and/or diagnosis of SARS-CoV-2 by FDA under an Emergency Use Authorization (EUA). This EUA will remain  in effect (meaning this test can be used) for the duration of the COVID-19 declaration under Section 56 4(b)(1) of the Act, 21 U.S.C. section 360bbb-3(b)(1), unless the authorization is terminated or revoked sooner. Performed at [facility], [place] [address], [place], [place] [address]          Radiology Studies: Ct Head Wo Contrast  Result Date: 08/05/2019 CLINICAL DATA:  Her family (spouse) phoned EMS, telling them that pt. "drank a beer that might've had cocaine in it--she took one sip and passed out". He mentioned to EMS that there had been "a recent death in the family". EXAM: CT HEAD WITHOUT CONTRAST TECHNIQUE: Contiguous axial images were obtained from the base of the skull through the vertex without intravenous contrast. COMPARISON:  None. FINDINGS: Brain: No evidence of acute infarction, hemorrhage, hydrocephalus, extra-axial collection or mass lesion/mass effect. Subtle areas of white matter hypoattenuation noted consistent with minor chronic microvascular ischemic change. Vascular: No hyperdense vessel or unexpected calcification. Skull: Normal. Negative for fracture or focal lesion. Sinuses/Orbits: Globes and orbits are unremarkable. Mild sinus mucosal thickening. Nasal trumpet in place. Clear mastoid air cells. Other: None. IMPRESSION: 1. No acute intracranial abnormalities. 2. Minor chronic microvascular ischemic change. Electronically Signed   By: Lajean Manes M.D.   On: 08/05/2019 19:30   Dg Chest Port 1 View  Result Date: 08/05/2019 CLINICAL DATA:  Altered level of consciousness. EXAM:  PORTABLE CHEST 1 VIEW COMPARISON:  12/24/2015 FINDINGS: Heart size is normal. Diffuse pulmonary interstitial prominence is seen, suspicious for interstitial edema or pneumonitis. No evidence of pulmonary consolidation or pleural effusion. IMPRESSION: Diffuse pulmonary interstitial prominence, suspicious for interstitial edema or pneumonitis. Electronically Signed   By: Marlaine Hind M.D.   On: 08/05/2019 18:40        Scheduled Meds: . enoxaparin (LOVENOX) injection  40 mg Subcutaneous Daily   Continuous Infusions: . sodium chloride 125 mL/hr at 08/05/19 2315  . ampicillin-sulbactam (UNASYN) IV 3 g (08/06/19 0522)     LOS: 0 days    Time spent: 35 minutes    Irine Seal, MD [facility]  If 7PM-7AM, please contact night-coverage [email or web address] 08/06/2019, 11:33 AM

## 2019-08-07 DIAGNOSIS — G9340 Encephalopathy, unspecified: Secondary | ICD-10-CM

## 2019-08-07 DIAGNOSIS — T50901A Poisoning by unspecified drugs, medicaments and biological substances, accidental (unintentional), initial encounter: Secondary | ICD-10-CM

## 2019-08-07 LAB — CBC WITH DIFFERENTIAL/PLATELET
Abs Immature Granulocytes: 0.04 10*3/uL (ref 0.00–0.07)
Basophils Absolute: 0 10*3/uL (ref 0.0–0.1)
Basophils Relative: 0 %
Eosinophils Absolute: 0.1 10*3/uL (ref 0.0–0.5)
Eosinophils Relative: 1 %
HCT: 41.6 % (ref 36.0–46.0)
Hemoglobin: 12.6 g/dL (ref 12.0–15.0)
Immature Granulocytes: 0 %
Lymphocytes Relative: 28 %
Lymphs Abs: 3.2 10*3/uL (ref 0.7–4.0)
MCH: 29.6 pg (ref 26.0–34.0)
MCHC: 30.3 g/dL (ref 30.0–36.0)
MCV: 97.7 fL (ref 80.0–100.0)
Monocytes Absolute: 0.7 10*3/uL (ref 0.1–1.0)
Monocytes Relative: 6 %
Neutro Abs: 7.4 10*3/uL (ref 1.7–7.7)
Neutrophils Relative %: 65 %
Platelets: 264 10*3/uL (ref 150–400)
RBC: 4.26 MIL/uL (ref 3.87–5.11)
RDW: 15.4 % (ref 11.5–15.5)
WBC: 11.4 10*3/uL — ABNORMAL HIGH (ref 4.0–10.5)
nRBC: 0 % (ref 0.0–0.2)

## 2019-08-07 LAB — BASIC METABOLIC PANEL
Anion gap: 6 (ref 5–15)
BUN: 9 mg/dL (ref 8–23)
CO2: 24 mmol/L (ref 22–32)
Calcium: 8.1 mg/dL — ABNORMAL LOW (ref 8.9–10.3)
Chloride: 112 mmol/L — ABNORMAL HIGH (ref 98–111)
Creatinine, Ser: 0.63 mg/dL (ref 0.44–1.00)
GFR calc Af Amer: >60 mL/min (ref >60)
GFR calc non Af Amer: >60 mL/min (ref >60)
Glucose, Bld: 91 mg/dL (ref 70–99)
Potassium: 3.6 mmol/L (ref 3.5–5.1)
Sodium: 142 mmol/L (ref 135–145)

## 2019-08-07 LAB — URINE CULTURE

## 2019-08-07 LAB — LEGIONELLA PNEUMOPHILA SEROGP 1 UR AG: L. pneumophila Serogp 1 Ur Ag: NEGATIVE

## 2019-08-07 MED ORDER — AMOXICILLIN-POT CLAVULANATE 875-125 MG PO TABS
1.0000 | ORAL_TABLET | Freq: Two times a day (BID) | ORAL | Status: DC
  Administered 2019-08-07 – 2019-08-08 (×3): 1 via ORAL
  Filled 2019-08-07 (×3): qty 1

## 2019-08-07 MED ORDER — POTASSIUM CHLORIDE CRYS ER 20 MEQ PO TBCR
40.0000 meq | EXTENDED_RELEASE_TABLET | Freq: Once | ORAL | Status: AC
  Administered 2019-08-07: 40 meq via ORAL
  Filled 2019-08-07: qty 2

## 2019-08-07 MED ORDER — NICOTINE 14 MG/24HR TD PT24
14.0000 mg | MEDICATED_PATCH | Freq: Every day | TRANSDERMAL | Status: DC
  Administered 2019-08-07 – 2019-08-08 (×2): 14 mg via TRANSDERMAL
  Filled 2019-08-07 (×2): qty 1

## 2019-08-07 NOTE — Progress Notes (Signed)
Patient ambulated approx 100 feet with RW on RA.  Sats remained 94-96% on RA.  Patient states she feels weak and slightly unsteady, using RW helped.

## 2019-08-07 NOTE — Progress Notes (Addendum)
PROGRESS NOTE    Kim Ross  HBZ:[id number] DOB: 05/03/1956 DOA: 08/05/2019 PCP: Daisy Floro, DO    Brief Narrative:  HPI per Dr. Otelia Santee Kim Ross is a 63 y.o. female with medical history significant of anxiety, depression presenting to the ED for evaluation of altered mental status.  Per EMS report, patient spouse told them that the patient had a beer that might have had cocaine in it.  She took a sip and passed out.  He reported to EMS that there had been a recent death in the family.  Upon arrival of EMS, patient was found to be very lethargic with pinpoint pupils.  They saw some brown liquid that did not look like beer.  She became more alert after receiving a total of 4 mg IV Narcan.  Patient is currently somnolent.  Oriented to self only and recognizes has been at bedside.  States "I drank all kinds of fluids."  Denies drug use.  Denies chest pain, shortness of breath, or cough.  No additional history could be obtained from her.  Husband states patient was in her usual state of health.  He saw her when she returned from work and noticed that she was drinking beer.  He thinks someone might have mixed opiates in her beer.  He then went outside and when he returned to the house, patient was less responsive.  ED Course: Initially satting 96% on room air, subsequently dropped to 88%.  Placed on 4 L supplemental oxygen via nasal cannula.  Afebrile.  White blood cell count 20.1 with left shift.  Blood ethanol level <10.  Ammonia level normal.  Salicylate and acetaminophen levels not elevated.  UA not suggestive of infection.  UDS positive for cocaine.  SARS-CoV-2 test pending.  Chest x-ray showing diffuse pulmonary interstitial prominence, suspicious for interstitial edema or pneumonitis.  CT head negative for acute finding. Patient received Zofran and 1 L normal saline bolus.  Assessment & Plan:   Principal Problem:   Drug intoxication ([place]) Active Problems:   Aspiration  pneumonitis ([place])   Acute respiratory failure with hypoxia ([place])   Encounter for screening for HIV   Cocaine abuse ([place])   Acute encephalopathy   1 acute metabolic encephalopathy secondary to drug intoxication Patient initially found to be less responsive noted to have pinpoint pupils by EMS and more responsive after receiving some Narcan.  UDS was positive for cocaine.  Patient not hypotensive not tachycardic.  Blood alcohol level was less than 10.  Ammonia level normal.  Salicylate level < 7.  Acetaminophen level < 10.  Urinalysis with trace leukocytes, nitrite negative, 6-10 WBCs.  Urine cultures with multiple species.  Blood cultures pending with no growth to date.  Patient improving clinically.  No further nausea or vomiting. Head CT obtained negative for any acute abnormalities.  Saline lock IV fluids. Social work consulted for substance abuse.  Supportive care.  2.  Acute hypoxic respiratory failure secondary to drug intoxication and probable aspiration pneumonitis Patient noted to have initial sats of 96 on room air however subsequently dropped to 88% and patient placed on 4 L nasal cannula.  Patient afebrile.  Patient noted to have a leukocytosis with a white count of 20,000 with a left shift she is trending down with a WBC of 11,400.Marland Kitchen  Chest x-ray which was done showed diffuse pulmonary interstitial prominence suspicious for interstitial edema versus pneumonitis.  BNP level within normal limits.  SARS-CoV-2 test negative.  Procalcitonin level at 0.28.  Urine Legionella antigen negative.  Urine pneumococcus antigen negative.  Blood cultures pending with no growth to date.  Continue IV Unasyn and transition to oral Augmentin tomorrow.  Check ambulatory sats.   3.  Leukocytosis Likely secondary to problem #2.  Patient pancultured.  Urine cultures with multiple species.  Blood cultures pending with no growth to date.  Urine Legionella antigen negative.  Urine pneumococcus antigen negative.   Sputum Gram stain and culture pending.  COVID-19 negative.  Continue empiric IV Unasyn for probable aspiration pneumonia.  Follow.   4.  Polysubstance abuse Social work consulted.  Polysubstance cessation stressed to patient.  DVT prophylaxis: Lovenox Code Status: Full Family Communication: Updated patient.  No family at bedside. Disposition Plan: Likely home when clinically improved, no longer hypoxic and work-up completed hopefully tomorrow.   Consultants:   None  Procedures:   CT head 08/05/2019  Chest x-ray 08/05/2019  Antimicrobials:   Unasyn 08/05/2019 >>>>> 08/07/2019  Augmentin 08/08/2019   Subjective: Patient in bed.  Feeling better.  Denies any further nausea or emesis.  Tolerated full liquid diet earlier on this morning.  Denies any chest pain.  No significant shortness of breath.   Objective: Vitals:   08/07/19 0647 08/07/19 1302 08/07/19 1335 08/07/19 1444  BP: (!) 143/69  (!) 143/76   Pulse: 72  82   Resp: 20  18   Temp: 98 F (36.7 C)  98.5 F (36.9 C)   TempSrc: Oral  Oral   SpO2: 100% 96% 92% 94%  Weight:      Height:        Intake/Output Summary (Last 24 hours) at 08/07/2019 1705 Last data filed at 08/07/2019 1505 Gross per 24 hour  Intake 3242.32 ml  Output 1950 ml  Net 1292.32 ml   Filed Weights   08/05/19 2315  Weight: 74.5 kg    Examination:  General exam: NAD. Respiratory system: Scattered coarse breath sounds.  No wheezing.  No crackles.  Speaking in full sentences.  Normal respiratory effort.  Cardiovascular system: Regular rate rhythm no murmurs rubs or gallops.  No JVD.  No lower extremity edema.  Gastrointestinal system: Abdomen is soft, nontender, nondistended, positive bowel sounds.  No rebound.  No guarding. Central nervous system: Alert and oriented. No focal neurological deficits. Extremities: Symmetric 5 x 5 power. Skin: No rashes, lesions or ulcers Psychiatry: Judgement and insight appear normal. Mood & affect appropriate.      Data Reviewed: I have personally reviewed following labs and imaging studies  CBC: Recent Labs  Lab 08/05/19 1751 08/06/19 0714 08/07/19 0402  WBC 20.1* 17.2* 11.4*  NEUTROABS 13.1*  --  7.4  HGB 13.6 13.1 12.6  HCT 43.6 42.9 41.6  MCV 96.9 99.1 97.7  PLT 307 273 751   Basic Metabolic Panel: Recent Labs  Lab 08/05/19 1751 08/06/19 1123 08/07/19 0402  NA 142 142 142  K 3.5 4.4 3.6  CL 109 112* 112*  CO2 24 22 24   GLUCOSE 219* 109* 91  BUN 21 14 9   CREATININE 0.70 0.71 0.63  CALCIUM 8.4* 8.1* 8.1*  MG  --  2.0  --    GFR: Estimated Creatinine Clearance: 73 mL/min (by C-G formula based on SCr of 0.63 mg/dL). Liver Function Tests: Recent Labs  Lab 08/05/19 1751  AST 16  ALT 14  ALKPHOS 160*  BILITOT 0.7  PROT 7.1  ALBUMIN 3.8   No results for input(s): LIPASE, AMYLASE in the last 168 hours. Recent Labs  Lab 08/05/19  1752  AMMONIA 25   Coagulation Profile: No results for input(s): INR, PROTIME in the last 168 hours. Cardiac Enzymes: No results for input(s): CKTOTAL, CKMB, CKMBINDEX, TROPONINI in the last 168 hours. BNP (last 3 results) No results for input(s): PROBNP in the last 8760 hours. HbA1C: No results for input(s): HGBA1C in the last 72 hours. CBG: Recent Labs  Lab 08/05/19 1757  GLUCAP 203*   Lipid Profile: No results for input(s): CHOL, HDL, LDLCALC, TRIG, CHOLHDL, LDLDIRECT in the last 72 hours. Thyroid Function Tests: No results for input(s): TSH, T4TOTAL, FREET4, T3FREE, THYROIDAB in the last 72 hours. Anemia Panel: No results for input(s): VITAMINB12, FOLATE, FERRITIN, TIBC, IRON, RETICCTPCT in the last 72 hours. Sepsis Labs: Recent Labs  Lab 08/05/19 2351  PROCALCITON 0.28    Recent Results (from the past 240 hour(s))  SARS CORONAVIRUS 2 (TAT 6-24 HRS) Nasopharyngeal Nasopharyngeal Swab     Status: None   Collection Time: 08/05/19  8:50 PM   Specimen: Nasopharyngeal Swab  Result Value Ref Range Status   SARS  Coronavirus 2 NEGATIVE NEGATIVE Final    Comment: (NOTE) SARS-CoV-2 target nucleic acids are NOT DETECTED. The SARS-CoV-2 RNA is generally detectable in upper and lower respiratory specimens during the acute phase of infection. Negative results do not preclude SARS-CoV-2 infection, do not rule out co-infections with other pathogens, and should not be used as the sole basis for treatment or other patient management decisions. Negative results must be combined with clinical observations, patient history, and epidemiological information. The expected result is Negative. Fact Sheet for Patients: SugarRoll.be Fact Sheet for Healthcare Providers: https://www.woods-mathews.com/ This test is not yet approved or cleared by the [place] FDA and  has been authorized for detection and/or diagnosis of SARS-CoV-2 by FDA under an Emergency Use Authorization (EUA). This EUA will remain  in effect (meaning this test can be used) for the duration of the COVID-19 declaration under Section 56 4(b)(1) of the Act, 21 U.S.C. section 360bbb-3(b)(1), unless the authorization is terminated or revoked sooner. Performed at [facility], [place] [address] [place], [place] [address]   Culture, Urine     Status: Abnormal   Collection Time: 08/06/19  8:05 AM   Specimen: Urine, Catheterized  Result Value Ref Range Status   Specimen Description   Final    URINE, CATHETERIZED Performed at [facility] [address] [place], [place] [address]    Special Requests   Final    NONE Performed at [facility], [place] [address] [place], [place] [address]    Culture MULTIPLE SPECIES PRESENT, SUGGEST RECOLLECTION (A)  Final   Report Status 08/07/2019 FINAL  Final  Culture, blood (routine x 2) Call MD if unable to obtain prior to antibiotics being given     Status: None (Preliminary result)   Collection Time: 08/06/19 11:21 AM    Specimen: BLOOD RIGHT HAND  Result Value Ref Range Status   Specimen Description   Final    BLOOD RIGHT HAND Performed at [facility] [address] [place], [place] [address]    Special Requests   Final    BOTTLES DRAWN AEROBIC ONLY Blood Culture results may not be optimal due to an inadequate volume of blood received in culture bottles Performed at [facility] [address] [place], [place] [address]    Culture   Final    NO GROWTH < 24 HOURS Performed at [facility]  [address], [place], [place] [address]    Report Status PENDING  Incomplete  Culture, blood (routine x 2) Call MD if unable to obtain prior to antibiotics being given     Status: None (Preliminary result)   Collection Time: 08/06/19 11:21 AM   Specimen: BLOOD  Result Value Ref Range Status   Specimen Description   Final    BLOOD RIGHT HAND Performed at [facility] [address] [place], [place] [address]    Special Requests   Final    BOTTLES DRAWN AEROBIC AND ANAEROBIC Blood Culture results may not be optimal due to an inadequate volume of blood received in culture bottles   Culture   Final    NO GROWTH < 24 HOURS Performed at [facility] [address], [place], [place] [address]    Report Status PENDING  Incomplete         Radiology Studies: Ct Head Wo Contrast  Result Date: 08/05/2019 CLINICAL DATA:  Her family (spouse) phoned EMS, telling them that pt. "drank a beer that might've had cocaine in it--she took one sip and passed out". He mentioned to EMS that there had been "a recent death in the family". EXAM: CT HEAD WITHOUT CONTRAST TECHNIQUE: Contiguous axial images were obtained from the base of the skull through the vertex without intravenous contrast. COMPARISON:  None. FINDINGS: Brain: No evidence of acute infarction, hemorrhage, hydrocephalus, extra-axial collection or mass lesion/mass effect. Subtle areas of white  matter hypoattenuation noted consistent with minor chronic microvascular ischemic change. Vascular: No hyperdense vessel or unexpected calcification. Skull: Normal. Negative for fracture or focal lesion. Sinuses/Orbits: Globes and orbits are unremarkable. Mild sinus mucosal thickening. Nasal trumpet in place. Clear mastoid air cells. Other: None. IMPRESSION: 1. No acute intracranial abnormalities. 2. Minor chronic microvascular ischemic change. Electronically Signed   By: Lajean Manes M.D.   On: 08/05/2019 19:30   Dg Chest Port 1 View  Result Date: 08/05/2019 CLINICAL DATA:  Altered level of consciousness. EXAM: PORTABLE CHEST 1 VIEW COMPARISON:  12/24/2015 FINDINGS: Heart size is normal. Diffuse pulmonary interstitial prominence is seen, suspicious for interstitial edema or pneumonitis. No evidence of pulmonary consolidation or pleural effusion. IMPRESSION: Diffuse pulmonary interstitial prominence, suspicious for interstitial edema or pneumonitis. Electronically Signed   By: Marlaine Hind M.D.   On: 08/05/2019 18:40        Scheduled Meds:  enoxaparin (LOVENOX) injection  40 mg Subcutaneous Daily   nicotine  14 mg Transdermal Daily   Continuous Infusions:  ampicillin-sulbactam (UNASYN) IV 3 g (08/07/19 1635)     LOS: 1 day    Time spent: 35 minutes    Irine Seal, MD [facility]  If 7PM-7AM, please contact night-coverage [email or web address] 08/07/2019, 5:05 PM

## 2019-08-08 LAB — CBC WITH DIFFERENTIAL/PLATELET
Abs Immature Granulocytes: 0.02 10*3/uL (ref 0.00–0.07)
Basophils Absolute: 0 10*3/uL (ref 0.0–0.1)
Basophils Relative: 0 %
Eosinophils Absolute: 0.2 10*3/uL (ref 0.0–0.5)
Eosinophils Relative: 2 %
HCT: 41 % (ref 36.0–46.0)
Hemoglobin: 12.8 g/dL (ref 12.0–15.0)
Immature Granulocytes: 0 %
Lymphocytes Relative: 37 %
Lymphs Abs: 3.3 10*3/uL (ref 0.7–4.0)
MCH: 29.8 pg (ref 26.0–34.0)
MCHC: 31.2 g/dL (ref 30.0–36.0)
MCV: 95.6 fL (ref 80.0–100.0)
Monocytes Absolute: 0.7 10*3/uL (ref 0.1–1.0)
Monocytes Relative: 8 %
Neutro Abs: 4.6 10*3/uL (ref 1.7–7.7)
Neutrophils Relative %: 53 %
Platelets: 283 10*3/uL (ref 150–400)
RBC: 4.29 MIL/uL (ref 3.87–5.11)
RDW: 15 % (ref 11.5–15.5)
WBC: 8.9 10*3/uL (ref 4.0–10.5)
nRBC: 0 % (ref 0.0–0.2)

## 2019-08-08 LAB — BASIC METABOLIC PANEL
Anion gap: 9 (ref 5–15)
BUN: 8 mg/dL (ref 8–23)
CO2: 24 mmol/L (ref 22–32)
Calcium: 8.4 mg/dL — ABNORMAL LOW (ref 8.9–10.3)
Chloride: 110 mmol/L (ref 98–111)
Creatinine, Ser: 0.65 mg/dL (ref 0.44–1.00)
GFR calc Af Amer: >60 mL/min (ref >60)
GFR calc non Af Amer: >60 mL/min (ref >60)
Glucose, Bld: 154 mg/dL — ABNORMAL HIGH (ref 70–99)
Potassium: 3.4 mmol/L — ABNORMAL LOW (ref 3.5–5.1)
Sodium: 143 mmol/L (ref 135–145)

## 2019-08-08 MED ORDER — POTASSIUM CHLORIDE CRYS ER 20 MEQ PO TBCR
40.0000 meq | EXTENDED_RELEASE_TABLET | Freq: Once | ORAL | Status: AC
  Administered 2019-08-08: 11:00:00 40 meq via ORAL
  Filled 2019-08-08: qty 2

## 2019-08-08 MED ORDER — AMOXICILLIN-POT CLAVULANATE 875-125 MG PO TABS: 1.0000 | ORAL_TABLET | Freq: Two times a day (BID) | ORAL | 0 refills | Status: DC

## 2019-08-08 NOTE — TOC Progression Note (Signed)
Transition of Care [facility]) - Progression Note    Patient Details  Name: Kim Ross MRN: [id number] Date of Birth: 18-May-1956  Transition of Care [facility]) CM/SW Contact  Purcell Mouton, RN Phone Number: 08/08/2019, 10:27 AM  Clinical Narrative:    Spoke with pt there are no needs at present time.    Expected Discharge Plan: Home/Self Care Barriers to Discharge: No Barriers Identified  Expected Discharge Plan and Services Expected Discharge Plan: Home/Self Care   Discharge Planning Services: CM Consult   Living arrangements for the past 2 months: Single Family Home Expected Discharge Date: 08/08/19                                     Social Determinants of Health (SDOH) Interventions    Readmission Risk Interventions No flowsheet data found.
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
## 2019-08-08 NOTE — Discharge Summary (Signed)
Physician Discharge Summary  Kim Ross LGX:[id number] DOB: [birth year] DOA: 08/05/2019  PCP: Daisy Floro, DO  Admit date: 08/05/2019 Discharge date: 08/08/2019  Admitted From: home Disposition:  home  Recommendations for Outpatient Follow-up:  1. Follow up with PCP in 1-2 weeks  Home Health: none  Equipment/Devices: none  Discharge Condition: stable CODE STATUS: Full code Diet recommendation: regular  HPI: Per admitting MD,  Kim Ross is a 63 y.o. female with medical history significant of anxiety, depression presenting to the ED for evaluation of altered mental status.  Per EMS report, patient spouse told them that the patient had a beer that might have had cocaine in it.  She took a sip and passed out.  He reported to EMS that there had been a recent death in the family.  Upon arrival of EMS, patient was found to be very lethargic with pinpoint pupils.  They saw some brown liquid that did not look like beer.  She became more alert after receiving a total of 4 mg IV Narcan. Patient is currently somnolent.  Oriented to self only and recognizes has been at bedside.  States "I drank all kinds of fluids."  Denies drug use.  Denies chest pain, shortness of breath, or cough.  No additional history could be obtained from her.  Husband states patient was in her usual state of health.  He saw her when she returned from work and noticed that she was drinking beer.  He thinks someone might have mixed opiates in her beer.  He then went outside and when he returned to the house, patient was less responsive. ED Course: Initially satting 96% on room air, subsequently dropped to 88%.  Placed on 4 L supplemental oxygen via nasal cannula.  Afebrile.  White blood cell count 20.1 with left shift.  Blood ethanol level <10.  Ammonia level normal.  Salicylate and acetaminophen levels not elevated.  UA not suggestive of infection.  UDS positive for cocaine.  SARS-CoV-2 test pending.  Chest x-ray showing  diffuse pulmonary interstitial prominence, suspicious for interstitial edema or pneumonitis.  CT head negative for acute finding. Patient received Zofran and 1 L normal saline bolus.  Hospital Course: Acute metabolic encephalopathy-secondary to drug intoxication, she was poorly responsive on admission, was given supportive treatment and improved and now she has returned to baseline.  She will oriented x4 and back to normal.  She is able to eat regular diet, ambulate without difficulties.  She tells me she has no idea how she tested positive for cocaine last thing she remember was coming from work and having a glass of wine.  She denies any depression/SI Acute hypoxic respiratory failure /aspiration pneumonia-likely in the setting of drug intoxication, possibly aspirated as well.  She was able to be weaned off to room air, she is breathing comfortably, placed empirically on Augmentin which will continue for 5 additional days following discharge.  White count has improved Cocaine use-based on positive urine drug screen, however she denies doing cocaine  Discharge Diagnoses:  Principal Problem:   Drug intoxication ([place]) Active Problems:   Aspiration pneumonitis ([place])   Acute respiratory failure with hypoxia ([place])   Encounter for screening for HIV   Cocaine abuse ([place])   Acute encephalopathy  Discharge Instructions  Allergies as of 08/08/2019      Reactions   Ibuprofen    Trazodone And Nefazodone    AM Dizziness & Drowsiness       Medication List  STOP taking these medications   buPROPion 150 MG 24 hr tablet Commonly known as: WELLBUTRIN XL   fluticasone 50 MCG/ACT nasal spray Commonly known as: FLONASE   ibuprofen 600 MG tablet Commonly known as: ADVIL   ketoconazole 2 % cream Commonly known as: NIZORAL   sodium chloride 0.65 % Soln nasal spray Commonly known as: OCEAN     TAKE these medications   amoxicillin-clavulanate 875-125 MG tablet Commonly known as:  AUGMENTIN Take 1 tablet by mouth every 12 (twelve) hours.   atorvastatin 40 MG tablet Commonly known as: LIPITOR TAKE 1 TABLET BY MOUTH EVERY DAY   meclizine 25 MG tablet Commonly known as: ANTIVERT Take 1 tablet (25 mg total) by mouth 2 (two) times daily.   multivitamin with minerals tablet Take 1 tablet by mouth daily.   sertraline 50 MG tablet Commonly known as: ZOLOFT Take 1 tablet (50 mg total) by mouth daily.      Follow-up Information    Daisy Floro, DO. Schedule an appointment as soon as possible for a visit in 1 week(s).   Specialty: Family Medicine Contact information: [address] [place] [phone]           Consultations:  None   Procedures/Studies:  Ct Head Wo Contrast  Result Date: 08/05/2019 CLINICAL DATA:  Her family (spouse) phoned EMS, telling them that pt. "drank a beer that might've had cocaine in it--she took one sip and passed out". He mentioned to EMS that there had been "a recent death in the family". EXAM: CT HEAD WITHOUT CONTRAST TECHNIQUE: Contiguous axial images were obtained from the base of the skull through the vertex without intravenous contrast. COMPARISON:  None. FINDINGS: Brain: No evidence of acute infarction, hemorrhage, hydrocephalus, extra-axial collection or mass lesion/mass effect. Subtle areas of white matter hypoattenuation noted consistent with minor chronic microvascular ischemic change. Vascular: No hyperdense vessel or unexpected calcification. Skull: Normal. Negative for fracture or focal lesion. Sinuses/Orbits: Globes and orbits are unremarkable. Mild sinus mucosal thickening. Nasal trumpet in place. Clear mastoid air cells. Other: None. IMPRESSION: 1. No acute intracranial abnormalities. 2. Minor chronic microvascular ischemic change. Electronically Signed   By: Lajean Manes M.D.   On: 08/05/2019 19:30   Dg Chest Port 1 View  Result Date: 08/05/2019 CLINICAL DATA:  Altered level of  consciousness. EXAM: PORTABLE CHEST 1 VIEW COMPARISON:  12/24/2015 FINDINGS: Heart size is normal. Diffuse pulmonary interstitial prominence is seen, suspicious for interstitial edema or pneumonitis. No evidence of pulmonary consolidation or pleural effusion. IMPRESSION: Diffuse pulmonary interstitial prominence, suspicious for interstitial edema or pneumonitis. Electronically Signed   By: Marlaine Hind M.D.   On: 08/05/2019 18:40     Subjective: - no chest pain, shortness of breath, no abdominal pain, nausea or vomiting.   Discharge Exam: BP (!) 152/72 (BP Location: Left Arm)    Pulse 85    Temp 98.5 F (36.9 C) (Oral)    Resp 20    Ht 5\' 4"  (1.626 m)    Wt 73.5 kg    SpO2 95%    BMI 27.81 kg/m   General: Pt is alert, awake, not in acute distress Cardiovascular: RRR, S1/S2 +, no rubs, no gallops Respiratory: CTA bilaterally, no wheezing, no rhonchi Abdominal: Soft, NT, ND, bowel sounds + Extremities: no edema, no cyanosis    The results of significant diagnostics from this hospitalization (including imaging, microbiology, ancillary and laboratory) are listed below for reference.     Microbiology: Recent Results (from  the past 240 hour(s))  SARS CORONAVIRUS 2 (TAT 6-24 HRS) Nasopharyngeal Nasopharyngeal Swab     Status: None   Collection Time: 08/05/19  8:50 PM   Specimen: Nasopharyngeal Swab  Result Value Ref Range Status   SARS Coronavirus 2 NEGATIVE NEGATIVE Final    Comment: (NOTE) SARS-CoV-2 target nucleic acids are NOT DETECTED. The SARS-CoV-2 RNA is generally detectable in upper and lower respiratory specimens during the acute phase of infection. Negative results do not preclude SARS-CoV-2 infection, do not rule out co-infections with other pathogens, and should not be used as the sole basis for treatment or other patient management decisions. Negative results must be combined with clinical observations, patient history, and epidemiological information. The  expected result is Negative. Fact Sheet for Patients: SugarRoll.be Fact Sheet for Healthcare Providers: https://www.woods-mathews.com/ This test is not yet approved or cleared by the [place] FDA and  has been authorized for detection and/or diagnosis of SARS-CoV-2 by FDA under an Emergency Use Authorization (EUA). This EUA will remain  in effect (meaning this test can be used) for the duration of the COVID-19 declaration under Section 56 4(b)(1) of the Act, 21 U.S.C. section 360bbb-3(b)(1), unless the authorization is terminated or revoked sooner. Performed at [facility], [place] [address], [place], [place] [address]   Culture, Urine     Status: Abnormal   Collection Time: 08/06/19  8:05 AM   Specimen: Urine, Catheterized  Result Value Ref Range Status   Specimen Description   Final    URINE, CATHETERIZED Performed at [facility] [address] [place], [place] [address]    Special Requests   Final    NONE Performed at [facility], [place] [address], [place], [place] [address]    Culture MULTIPLE SPECIES PRESENT, SUGGEST RECOLLECTION (A)  Final   Report Status 08/07/2019 FINAL  Final  Culture, blood (routine x 2) Call MD if unable to obtain prior to antibiotics being given     Status: None (Preliminary result)   Collection Time: 08/06/19 11:21 AM   Specimen: BLOOD RIGHT HAND  Result Value Ref Range Status   Specimen Description   Final    BLOOD RIGHT HAND Performed at [facility] [address], [place], [place] [address]    Special Requests   Final    BOTTLES DRAWN AEROBIC ONLY Blood Culture results may not be optimal due to an inadequate volume of blood received in culture bottles Performed at [facility] [address], [place], [place] [address]    Culture   Final    NO GROWTH < 24 HOURS Performed at [facility] [address], [place], [place] [address]    Report Status PENDING  Incomplete  Culture, blood (routine x 2) Call MD if unable to obtain prior to antibiotics being given     Status: None (Preliminary result)   Collection Time: 08/06/19 11:21 AM   Specimen: BLOOD  Result Value Ref Range Status   Specimen Description   Final    BLOOD RIGHT HAND Performed at [facility] [address], [place],  [address]    Special Requests   Final    BOTTLES DRAWN AEROBIC AND ANAEROBIC Blood Culture results may not be optimal due to an inadequate volume of blood received in culture bottles   Culture   Final    NO GROWTH < 24 HOURS Performed at [facility] [address],  [place], [place] [address]    Report Status PENDING  Incomplete     Labs: BNP (last 3 results) Recent Labs    08/05/19 2351  BNP 25.4   Basic Metabolic Panel: Recent Labs  Lab 08/05/19 1751 08/06/19 1123 08/07/19 0402 08/08/19 0341  NA 142 142 142 143  K 3.5 4.4 3.6 3.4*  CL 109 112* 112* 110  CO2 24 22 24 24   GLUCOSE 219* 109* 91 154*  BUN 21 14 9 8   CREATININE 0.70 0.71 0.63 0.65  CALCIUM 8.4* 8.1* 8.1* 8.4*  MG  --  2.0  --   --    Liver Function Tests: Recent Labs  Lab 08/05/19 1751  AST 16  ALT 14  ALKPHOS 160*  BILITOT 0.7  PROT 7.1  ALBUMIN 3.8   No results for input(s): LIPASE, AMYLASE in the last 168 hours. Recent Labs  Lab 08/05/19 1752  AMMONIA 25   CBC: Recent Labs  Lab 08/05/19 1751 08/06/19 0714 08/07/19 0402 08/08/19 0341  WBC 20.1* 17.2* 11.4* 8.9  NEUTROABS 13.1*  --  7.4 4.6  HGB 13.6 13.1 12.6 12.8  HCT 43.6 42.9 41.6 41.0  MCV 96.9 99.1 97.7 95.6  PLT 307 273 264 283   Cardiac Enzymes: No results for input(s): CKTOTAL, CKMB, CKMBINDEX, TROPONINI in the last 168 hours. BNP: Invalid input(s): POCBNP CBG: Recent Labs  Lab 08/05/19 1757  GLUCAP 203*   D-Dimer No results for input(s): DDIMER in the last 72 hours. Hgb A1c No results for input(s):  HGBA1C in the last 72 hours. Lipid Profile No results for input(s): CHOL, HDL, LDLCALC, TRIG, CHOLHDL, LDLDIRECT in the last 72 hours. Thyroid function studies No results for input(s): TSH, T4TOTAL, T3FREE, THYROIDAB in the last 72 hours.  Invalid input(s): FREET3 Anemia work up No results for input(s): VITAMINB12, FOLATE, FERRITIN, TIBC, IRON, RETICCTPCT in the last 72 hours. Urinalysis    Component Value Date/Time   COLORURINE YELLOW 08/06/2019 0804   APPEARANCEUR HAZY (A) 08/06/2019 0804   LABSPEC 1.018 08/06/2019 0804   PHURINE 5.0 08/06/2019 0804   GLUCOSEU NEGATIVE 08/06/2019 0804   HGBUR MODERATE (A) 08/06/2019 0804   BILIRUBINUR NEGATIVE 08/06/2019 0804   BILIRUBINUR negative 10/26/2018 1615   BILIRUBINUR NEG 09/30/2016 1438   KETONESUR 20 (A) 08/06/2019 0804   PROTEINUR NEGATIVE 08/06/2019 0804   UROBILINOGEN 1.0 10/26/2018 1615   UROBILINOGEN 1.0 10/30/2012 1123   NITRITE NEGATIVE 08/06/2019 0804   LEUKOCYTESUR TRACE (A) 08/06/2019 0804   Sepsis Labs Invalid input(s): PROCALCITONIN,  WBC,  LACTICIDVEN  FURTHER DISCHARGE INSTRUCTIONS:   Get Medicines reviewed and adjusted: Please take all your medications with you for your next visit with your Primary MD   Laboratory/radiological data: Please request your Primary MD to go over all hospital tests and procedure/radiological results at the follow up, please ask your Primary MD to get all Hospital records sent to his/her office.   In some cases, they will be blood work, cultures and biopsy results pending at the time of your discharge. Please request that your primary care M.D. goes through all the records of your hospital data and follows up on these results.   Also Note the following: If you experience worsening of your admission symptoms, develop shortness of breath, life threatening emergency, suicidal or homicidal thoughts you must seek medical attention immediately by calling 911 or calling your MD immediately  if  symptoms less severe.   You must read complete instructions/literature along with all the possible adverse reactions/side effects  for all the Medicines you take and that have been prescribed to you. Take any new Medicines after you have completely understood and accpet all the possible adverse reactions/side effects.    Do not drive when taking Pain medications or sleeping medications (Benzodaizepines)   Do not take more than prescribed Pain, Sleep and Anxiety Medications. It is not advisable to combine anxiety,sleep and pain medications without talking with your primary care practitioner   Special Instructions: If you have smoked or chewed Tobacco  in the last 2 yrs please stop smoking, stop any regular Alcohol  and or any Recreational drug use.   Wear Seat belts while driving.   Please note: You were cared for by a hospitalist during your hospital stay. Once you are discharged, your primary care physician will handle any further medical issues. Please note that NO REFILLS for any discharge medications will be authorized once you are discharged, as it is imperative that you return to your primary care physician (or establish a relationship with a primary care physician if you do not have one) for your post hospital discharge needs so that they can reassess your need for medications and monitor your lab values.  Time coordinating discharge: 35 minutes  SIGNED:  Marzetta Board, MD, PhD 08/08/2019, 10:48 AM

## 2019-08-08 NOTE — Progress Notes (Signed)
Pt is in no acute distress at this time. Discharg/Antibiotics education was done. Pt has scheduled her followup for next Wed. with her PCP.  Pt verbalized understanding of discharge instructions. No questions at this time.

## 2019-08-08 NOTE — Discharge Instructions (Signed)
Follow with Daisy Floro, DO in 5-7 days  Please get a complete blood count and chemistry panel checked by your Primary MD at your next visit, and again as instructed by your Primary MD. Please get your medications reviewed and adjusted by your Primary MD.  Please request your Primary MD to go over all Hospital Tests and Procedure/Radiological results at the follow up, please get all Hospital records sent to your Prim MD by signing hospital release before you go home.  In some cases, there will be blood work, cultures and biopsy results pending at the time of your discharge. Please request that your primary care M.D. goes through all the records of your hospital data and follows up on these results.  If you had Pneumonia of Lung problems at the Hospital: Please get a 2 view Chest X ray done in 6-8 weeks after hospital discharge or sooner if instructed by your Primary MD.  If you have Congestive Heart Failure: Please call your Cardiologist or Primary MD anytime you have any of the following symptoms:  1) 3 pound weight gain in 24 hours or 5 pounds in 1 week  2) shortness of breath, with or without a dry hacking cough  3) swelling in the hands, feet or stomach  4) if you have to sleep on extra pillows at night in order to breathe  Follow cardiac low salt diet and 1.5 lit/day fluid restriction.  If you have diabetes Accuchecks 4 times/day, Once in AM empty stomach and then before each meal. Log in all results and show them to your primary doctor at your next visit. If any glucose reading is under 80 or above 300 call your primary MD immediately.  If you have Seizure/Convulsions/Epilepsy: Please do not drive, operate heavy machinery, participate in activities at heights or participate in high speed sports until you have seen by Primary MD or a Neurologist and advised to do so again. Per [facility] statutes, patients with seizures are not allowed to drive until they have been  seizure-free for six months.  Use caution when using heavy equipment or power tools. Avoid working on ladders or at heights. Take showers instead of baths. Ensure the water temperature is not too high on the home water heater. Do not go swimming alone. Do not lock yourself in a room alone (i.e. bathroom). When caring for infants or small children, sit down when holding, feeding, or changing them to minimize risk of injury to the child in the event you have a seizure. Maintain good sleep hygiene. Avoid alcohol.   If you had Gastrointestinal Bleeding: Please ask your Primary MD to check a complete blood count within one week of discharge or at your next visit. Your endoscopic/colonoscopic biopsies that are pending at the time of discharge, will also need to followed by your Primary MD.  Get Medicines reviewed and adjusted. Please take all your medications with you for your next visit with your Primary MD  Please request your Primary MD to go over all hospital tests and procedure/radiological results at the follow up, please ask your Primary MD to get all Hospital records sent to his/her office.  If you experience worsening of your admission symptoms, develop shortness of breath, life threatening emergency, suicidal or homicidal thoughts you must seek medical attention immediately by calling 911 or calling your MD immediately  if symptoms less severe.  You must read complete instructions/literature along with all the possible adverse reactions/side effects for all the Medicines you  take and that have been prescribed to you. Take any new Medicines after you have completely understood and accpet all the possible adverse reactions/side effects.   Do not drive or operate heavy machinery when taking Pain medications.   Do not take more than prescribed Pain, Sleep and Anxiety Medications  Special Instructions: If you have smoked or chewed Tobacco  in the last 2 yrs please stop smoking, stop any regular  Alcohol  and or any Recreational drug use.  Wear Seat belts while driving.  Please note You were cared for by a hospitalist during your hospital stay. If you have any questions about your discharge medications or the care you received while you were in the hospital after you are discharged, you can call the unit and asked to speak with the hospitalist on call if the hospitalist that took care of you is not available. Once you are discharged, your primary care physician will handle any further medical issues. Please note that NO REFILLS for any discharge medications will be authorized once you are discharged, as it is imperative that you return to your primary care physician (or establish a relationship with a primary care physician if you do not have one) for your aftercare needs so that they can reassess your need for medications and monitor your lab values.  You can reach the hospitalist office at phone [phone] or fax [phone]   If you do not have a primary care physician, you can call [phone] for a physician referral.  Activity: As tolerated with Full fall precautions use walker/cane & assistance as needed    Diet: regular  Disposition Home

## 2019-08-11 LAB — CULTURE, BLOOD (ROUTINE X 2)
Culture: NO GROWTH
Culture: NO GROWTH

## 2019-08-15 ENCOUNTER — Ambulatory Visit (INDEPENDENT_AMBULATORY_CARE_PROVIDER_SITE_OTHER): Payer: Self-pay | Admitting: Family Medicine

## 2019-08-15 ENCOUNTER — Other Ambulatory Visit: Payer: Self-pay

## 2019-08-15 ENCOUNTER — Encounter: Payer: Self-pay | Admitting: Family Medicine

## 2019-08-15 VITALS — BP 122/58 | HR 87 | Wt 161.4 lb

## 2019-08-15 DIAGNOSIS — Z09 Encounter for follow-up examination after completed treatment for conditions other than malignant neoplasm: Secondary | ICD-10-CM

## 2019-08-15 DIAGNOSIS — Z87440 Personal history of urinary (tract) infections: Secondary | ICD-10-CM

## 2019-08-15 NOTE — Assessment & Plan Note (Addendum)
Hospital discharge follow-up.  This was for accidental cocaine ingestion.  Patient states that she only used once for pain control.  Denies further use or other substances.  Screen negative for depression or anxiety.  Said that if patient ever wanted to get any help for substance use, we could provide for her in the future.  Patient also diagnosed with aspiration pneumonia.  She has completed course of Augmentin.  She is doing well and is asymptomatic.  She had normal exam.  No need to follow-up.

## 2019-08-15 NOTE — Patient Instructions (Signed)
It was a pleasure to see you today! Thank you for choosing [facility] for your primary care. Kim Ross was seen for hospital follow-up.   I am glad you are doing much better.  Please go to your dental appointment tomorrow to get resolution of the issue with your ongoing tooth.  You may take Tylenol as needed for pain.  Please follow-up with your regular doctor in December for your annual checkup.  Best,  Marny Lowenstein, MD, MS FAMILY MEDICINE RESIDENT - PGY3 08/15/2019 2:28 PM

## 2019-08-15 NOTE — Progress Notes (Signed)
    Subjective:  Kim Ross is a 63 y.o. female who presents to the [facility] today with a chief complaint of hospital follow-up.   HPI:  Hospital follow-up Patient is here for follow-up for recent hospitalization from 11/1.  Patient was found excellently overdosed on cocaine.  Initially denied cocaine use at the hospital, however does endorse using cocaine in the room.  Patient's UA was positive for cocaine in the hospital.  Says that she only used it once for tooth ache.  Denies any other drug use except for occasional marijuana that was several years ago.  Patient says she drinks alcohol occasionally, 4 beers while cooking on holidays such as Thanksgiving.  Denies drinking any other time.  Patient was also diagnosed with aspiration pneumonia by chest x-ray and elevated white count up to 20..  White count was normal on discharge.  She was discharged with a course of Augmentin.  She has completed this.  She reports that she is afebrile, has no cough, has no trouble breathing.  She says she has follow-up with a dentist tomorrow for her tooth ache.  Says that it is painful, and not improved with Goody's powder.  Chronic urinary tract infections Patient said that she has had multiple urinary tract infections since she was in the back.  She did have a UA that was collected in the hospital.  Urine culture suggested need to be recollected.  Patient not endorsing any dysuria at this time.  GAD-7 and PHQ-9 were collected today.  Both were negative.  Patient is denying depression/anxiety/SI.  ROS: Per HPI  PMH: Smoking history reviewed.    Objective:  Physical Exam: BP (!) 122/58   Pulse 87   Wt 161 lb 6.4 oz (73.2 kg)   SpO2 99%   BMI 27.70 kg/m   Gen: NAD, resting comfortably CV: RRR with no murmurs appreciated Pulm: NWOB, CTAB with no crackles, wheezes, or rhonchi GI: Soft, Nontender, Nondistended. MSK: no edema, cyanosis, or clubbing noted Skin: warm, dry Neuro: grossly normal, moves all  extremities Psych: Normal affect and thought content  No results found for this or any previous visit (from the past 72 hour(s)).   Assessment/Plan:  Hospital discharge follow-up Hospital discharge follow-up.  This was for accidental cocaine ingestion.  Patient states that she only used once for pain control.  Denies further use or other substances.  Screen negative for depression or anxiety.  Said that if patient ever wanted to get any help for substance use, we could provide for her in the future.  Patient also diagnosed with aspiration pneumonia.  She has completed course of Augmentin.  She is doing well and is asymptomatic.  She had normal exam.  No need to follow-up.  History of recurrent UTIs Patient with history of chronic UTIs.  Not experiencing any issues at this time.  Recommend she follow-up with PCP for checkup going forward.  She says she will do so in December.  Precepted with Dr. Ardelia Mems. Lab Orders  No laboratory test(s) ordered today    No orders of the defined types were placed in this encounter.     Marny Lowenstein, MD, MS FAMILY MEDICINE RESIDENT - PGY3 08/15/2019 2:46 PM

## 2019-08-15 NOTE — Assessment & Plan Note (Signed)
Patient with history of chronic UTIs.  Not experiencing any issues at this time.  Recommend she follow-up with PCP for checkup going forward.  She says she will do so in December.

## 2019-09-03 ENCOUNTER — Other Ambulatory Visit: Payer: Self-pay | Admitting: *Deleted

## 2019-09-03 MED ORDER — MECLIZINE HCL 25 MG PO TABS: 25.0000 mg | ORAL_TABLET | Freq: Two times a day (BID) | ORAL | 3 refills | Status: DC

## 2019-09-03 MED ORDER — SERTRALINE HCL 50 MG PO TABS: 50.0000 mg | ORAL_TABLET | Freq: Every day | ORAL | 3 refills | Status: DC

## 2019-09-04 ENCOUNTER — Other Ambulatory Visit: Payer: Self-pay

## 2019-09-04 ENCOUNTER — Encounter: Payer: Self-pay | Admitting: Family Medicine

## 2019-09-04 ENCOUNTER — Ambulatory Visit (INDEPENDENT_AMBULATORY_CARE_PROVIDER_SITE_OTHER): Payer: Self-pay | Admitting: Family Medicine

## 2019-09-04 VITALS — BP 118/62 | HR 88 | Temp 98.1°F | Ht 65.0 in | Wt 160.0 lb

## 2019-09-04 DIAGNOSIS — R739 Hyperglycemia, unspecified: Secondary | ICD-10-CM

## 2019-09-04 DIAGNOSIS — F329 Major depressive disorder, single episode, unspecified: Secondary | ICD-10-CM

## 2019-09-04 DIAGNOSIS — Z1211 Encounter for screening for malignant neoplasm of colon: Secondary | ICD-10-CM

## 2019-09-04 DIAGNOSIS — Z111 Encounter for screening for respiratory tuberculosis: Secondary | ICD-10-CM

## 2019-09-04 DIAGNOSIS — F32A Depression, unspecified: Secondary | ICD-10-CM

## 2019-09-04 DIAGNOSIS — F17200 Nicotine dependence, unspecified, uncomplicated: Secondary | ICD-10-CM

## 2019-09-04 DIAGNOSIS — B351 Tinea unguium: Secondary | ICD-10-CM

## 2019-09-04 DIAGNOSIS — R7309 Other abnormal glucose: Secondary | ICD-10-CM

## 2019-09-04 DIAGNOSIS — E049 Nontoxic goiter, unspecified: Secondary | ICD-10-CM

## 2019-09-04 LAB — POCT GLYCOSYLATED HEMOGLOBIN (HGB A1C): Hemoglobin A1C: 6.3 % — AB (ref 4.0–5.6)

## 2019-09-04 MED ORDER — NICOTINE 7 MG/24HR TD PT24: 7.0000 mg | MEDICATED_PATCH | TRANSDERMAL | 3 refills | Status: DC
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
## 2019-09-04 NOTE — Progress Notes (Signed)
Subjective:    Patient ID: Kim Ross, female    DOB: 01-Jul-1956, 63 y.o.   MRN: [id number]   CC: Annual wellness visit  HPI: [name] is a 63 year old female with history of depression, GAD, tobacco use disorder, hyperlipidemia, and cocaine use presenting for annual wellness visit.  Exercise: Patient exercising daily in the form of walking her dog Smoking: Patient continues to smoke half pack per day.  Is interested in quitting/cutting back. Alcohol: Patient only occasionally drinks alcohol on holidays Other drug use: Patient occasionally uses cocaine, was recently hospitalized for cocaine overdose about 1 month ago  Toe nail black: The patient reports that her great toenail of her left foot turned black and thickened after she stubbed her toe.  She also reports some numbness/tingling in the toe.  Smoking: Patient states she is currently smoking half pack per day, which is a decrease from her previous amount.  She was hospitalized in November and was put on a nicotine patch.  She is interested in cutting back/quitting smoking.  Loss of family members: The patient reports within the last year or 2 her family has suffered a loss of some family members.  This has been difficult on her.  She does have a known history of anxiety and depression, which are well controlled with Zoloft 50 mg daily.  Desire for TB testing: The patient needs to have PPD testing for her job.  Smoking status reviewed -smoking half pack per day  Review of Systems -denies fevers, weight loss, chest pain, shortness of breath, cough, nausea, vomiting body aches, dysuria, urinary urgency, and polyphasia.   Objective:  BP 118/62   Pulse 88   Temp 98.1 F (36.7 C) (Oral)   Ht 5\' 5"  (1.651 m)   Wt 160 lb (72.6 kg)   SpO2 97%   BMI 26.63 kg/m  Vitals and nursing note reviewed  General: No distress, pleasant patient HEENT: No pharyngeal erythema or exudates, decent dentition appreciated Neck: No  lymphadenopathy, trachea midline, thyroid mass appreciated to left side of trachea Cardiac: RRR, clear S1 and S2 present, no murmurs appreciated Respiratory: CTA bilaterally, normal work of breathing Abdomen: Normal bowel sounds appreciated Extremities: no edema or cyanosis, 2+ DP pulses appreciated to bilateral lower extremities; darkened, thickened toenail appreciated on great toe of left foot.  Toe with brisk capillary refill Skin: warm and dry, no rashes noted Neuro: alert and oriented, no focal deficits   Assessment & Plan:   Screening for colon cancer In March 2019 patient had Cologuard ordered, however it never resulted.  No concerning symptoms for colon cancer, no family history.  However, patient has never had proper colon cancer screening. -Recommended patient get a colonoscopy, stated she would think about it -Plan to follow-up with patient after her labs result to encourage colonoscopy  Hyperglycemia Patient has a history of hyperglycemia found on BMP.  Patient denies polyphagia, polydipsia, polyuria. -Check HbA1c -Check thyroid function  Goiter Patient noted to have thyroid mass on physical exam today.  She denies any vision changes, weight changes, temperature intolerance, constipation, diarrhea, or rashes.  Of note, on physical exam the lateral third of her eyebrows is very thin.  She otherwise denies any hair changes. -Plan to check thyroid panel -Will refer patient for ultrasound of the thyroid and potentially FNA as needed.  Tobacco use disorder Since the patient's most recent hospitalization within the last month, she has cut back on smoking, currently only smoking half pack per day.  She  is interested on cutting back more and eventually quitting.  She had a nicotine patch while hospitalized and liked it. -Ordering nicotine patch for patient to wear daily -Instructed to take the patch off at night as it can cause nightmares and sleep disturbance  Visit for TB skin  test Patient needs TB test for her job. -Order PPD tb skin test  Minor depression The patient reports the loss of several family members recently.  This is been difficult for her and for her family to manage at times.  She continues to take her Zoloft 50 mg daily.  Denies any plans or desire to hurt herself or anyone else. -Patient will get back in touch with [place] if/when she is ready to talk to a counselor or therapist -Plan to continue to take Zoloft  Onychomycosis of left great toe Patient has thickened, darkened toenail of great toe on left foot.  She states it started after she hit her toe months ago.  She states that the thickness is what is causing her the most distress.  Occasionally she has some tingling, unsure if the tingling is due to nerve damage from injuring the toe versus peripheral neuropathy. -Patient offered referral to podiatrist, states she will think about it -Patient states she is not interested in having the toenail removed   Return in about 1 month (around 10/05/2019).   Dr. Milus Banister [facility], PGY-2

## 2019-09-05 DIAGNOSIS — Z111 Encounter for screening for respiratory tuberculosis: Secondary | ICD-10-CM | POA: Insufficient documentation

## 2019-09-05 DIAGNOSIS — E049 Nontoxic goiter, unspecified: Secondary | ICD-10-CM | POA: Insufficient documentation

## 2019-09-05 DIAGNOSIS — R739 Hyperglycemia, unspecified: Secondary | ICD-10-CM | POA: Insufficient documentation

## 2019-09-05 DIAGNOSIS — B351 Tinea unguium: Secondary | ICD-10-CM | POA: Insufficient documentation

## 2019-09-05 HISTORY — DX: Nontoxic goiter, unspecified: E04.9

## 2019-09-05 LAB — THYROID PANEL WITH TSH
Free Thyroxine Index: 1.3 (ref 1.2–4.9)
T3 Uptake Ratio: 24 % (ref 24–39)
T4, Total: 5.4 ug/dL (ref 4.5–12.0)
TSH: 1.11 u[IU]/mL (ref 0.450–4.500)

## 2019-09-05 NOTE — Assessment & Plan Note (Addendum)
Patient has thickened, darkened toenail of great toe on left foot.  She states it started after she hit her toe months ago.  She states that the thickness is what is causing her the most distress.  Occasionally she has some tingling, unsure if the tingling is due to nerve damage from injuring the toe versus peripheral neuropathy. -Patient offered referral to podiatrist, states she will think about it -Patient states she is not interested in having the toenail removed

## 2019-09-05 NOTE — Assessment & Plan Note (Signed)
Patient needs TB test for her job. -Order PPD tb skin test

## 2019-09-05 NOTE — Assessment & Plan Note (Signed)
In March 2019 patient had Cologuard ordered, however it never resulted.  No concerning symptoms for colon cancer, no family history.  However, patient has never had proper colon cancer screening. -Recommended patient get a colonoscopy, stated she would think about it -Plan to follow-up with patient after her labs result to encourage colonoscopy

## 2019-09-05 NOTE — Assessment & Plan Note (Signed)
Since the patient's most recent hospitalization within the last month, she has cut back on smoking, currently only smoking half pack per day.  She is interested on cutting back more and eventually quitting.  She had a nicotine patch while hospitalized and liked it. -Ordering nicotine patch for patient to wear daily -Instructed to take the patch off at night as it can cause nightmares and sleep disturbance

## 2019-09-05 NOTE — Assessment & Plan Note (Signed)
Patient noted to have thyroid mass on physical exam today.  She denies any vision changes, weight changes, temperature intolerance, constipation, diarrhea, or rashes.  Of note, on physical exam the lateral third of her eyebrows is very thin.  She otherwise denies any hair changes. -Plan to check thyroid panel -Will refer patient for ultrasound of the thyroid and potentially FNA as needed.

## 2019-09-05 NOTE — Assessment & Plan Note (Signed)
The patient reports the loss of several family members recently.  This is been difficult for her and for her family to manage at times.  She continues to take her Zoloft 50 mg daily.  Denies any plans or desire to hurt herself or anyone else. -Patient will get back in touch with [place] if/when she is ready to talk to a counselor or therapist -Plan to continue to take Zoloft

## 2019-09-05 NOTE — Assessment & Plan Note (Signed)
Patient has a history of hyperglycemia found on BMP.  Patient denies polyphagia, polydipsia, polyuria. -Check HbA1c -Check thyroid function

## 2019-09-06 ENCOUNTER — Ambulatory Visit: Payer: Medicaid Other

## 2019-09-06 ENCOUNTER — Other Ambulatory Visit: Payer: Self-pay

## 2019-09-06 DIAGNOSIS — Z111 Encounter for screening for respiratory tuberculosis: Secondary | ICD-10-CM

## 2019-09-06 LAB — TB SKIN TEST
Induration: 0 mm
TB Skin Test: NEGATIVE

## 2019-09-06 NOTE — Progress Notes (Signed)
PPD Reading Note  PPD read and results entered in EpicCare.  Result: 0 mm induration.  Interpretation: Negative  Allergic reaction: no

## 2019-09-07 ENCOUNTER — Encounter: Payer: Self-pay | Admitting: Family Medicine

## 2019-09-10 ENCOUNTER — Other Ambulatory Visit: Payer: Self-pay | Admitting: Family Medicine

## 2019-10-26 ENCOUNTER — Telehealth: Payer: Self-pay
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
## 2019-10-27 ENCOUNTER — Other Ambulatory Visit: Payer: Self-pay | Admitting: Family Medicine

## 2019-10-27 DIAGNOSIS — E049 Nontoxic goiter, unspecified: Secondary | ICD-10-CM

## 2019-10-27 DIAGNOSIS — N3000 Acute cystitis without hematuria: Secondary | ICD-10-CM

## 2019-10-27 MED ORDER — CEPHALEXIN 500 MG PO CAPS: 500.0000 mg | ORAL_CAPSULE | Freq: Four times a day (QID) | ORAL | 0 refills | Status: DC

## 2019-10-27 NOTE — Progress Notes (Signed)
Patient was contacted via [place] (10/27/2019) regarding message she left the day prior with concerns for UTI. Over the phone patient stated she was having suprapubic pain, urinary frequency, and pain with urination. Denied vaginal discharge, pelvic pain, and blood in urine. Her last Urine Cx in January 2020 (1 year prior) grew pan-sensitive Proteus mirabilis. Will treat with Keflex 500 qid x 5 days.  Additionally, patient continues to have goiter, TSH testing at time of last appt was within normal limits. Patient would benefit from further work up of goiter, will order Ultrasound of Neck, sending notification to CMA to schedule.  Milus Banister, [place], PGY-2 10/27/2019 4:19 PM

## 2019-10-28 ENCOUNTER — Emergency Department (HOSPITAL_COMMUNITY): Payer: Self-pay

## 2019-10-28 ENCOUNTER — Emergency Department (HOSPITAL_COMMUNITY)
Admission: EM | Admit: 2019-10-28 | Discharge: 2019-10-28 | Disposition: A | Discharge status: Home / Self Care | Payer: Self-pay | Attending: Emergency Medicine | Admitting: Emergency Medicine

## 2019-10-28 ENCOUNTER — Encounter (HOSPITAL_COMMUNITY): Payer: Self-pay | Admitting: Emergency Medicine

## 2019-10-28 ENCOUNTER — Other Ambulatory Visit: Payer: Self-pay

## 2019-10-28 DIAGNOSIS — J189 Pneumonia, unspecified organism: Secondary | ICD-10-CM | POA: Insufficient documentation

## 2019-10-28 DIAGNOSIS — Z20822 Contact with and (suspected) exposure to covid-19: Secondary | ICD-10-CM | POA: Insufficient documentation

## 2019-10-28 DIAGNOSIS — Z79899 Other long term (current) drug therapy: Secondary | ICD-10-CM | POA: Insufficient documentation

## 2019-10-28 DIAGNOSIS — R112 Nausea with vomiting, unspecified: Secondary | ICD-10-CM | POA: Insufficient documentation

## 2019-10-28 DIAGNOSIS — F17210 Nicotine dependence, cigarettes, uncomplicated: Secondary | ICD-10-CM | POA: Insufficient documentation

## 2019-10-28 LAB — CBC
HCT: 38.8 % (ref 36.0–46.0)
Hemoglobin: 12.9 g/dL (ref 12.0–15.0)
MCH: 30.2 pg (ref 26.0–34.0)
MCHC: 33.2 g/dL (ref 30.0–36.0)
MCV: 90.9 fL (ref 80.0–100.0)
Platelets: 318 10*3/uL (ref 150–400)
RBC: 4.27 MIL/uL (ref 3.87–5.11)
RDW: 14.5 % (ref 11.5–15.5)
WBC: 24.4 10*3/uL — ABNORMAL HIGH (ref 4.0–10.5)
nRBC: 0 % (ref 0.0–0.2)

## 2019-10-28 LAB — COMPREHENSIVE METABOLIC PANEL
ALT: 19 U/L (ref 0–44)
AST: 18 U/L (ref 15–41)
Albumin: 2.6 g/dL — ABNORMAL LOW (ref 3.5–5.0)
Alkaline Phosphatase: 135 U/L — ABNORMAL HIGH (ref 38–126)
Anion gap: 12 (ref 5–15)
BUN: 11 mg/dL (ref 8–23)
CO2: 23 mmol/L (ref 22–32)
Calcium: 8.7 mg/dL — ABNORMAL LOW (ref 8.9–10.3)
Chloride: 102 mmol/L (ref 98–111)
Creatinine, Ser: 0.74 mg/dL (ref 0.44–1.00)
GFR calc Af Amer: >60 mL/min (ref >60)
GFR calc non Af Amer: >60 mL/min (ref >60)
Glucose, Bld: 135 mg/dL — ABNORMAL HIGH (ref 70–99)
Potassium: 3.9 mmol/L (ref 3.5–5.1)
Sodium: 137 mmol/L (ref 135–145)
Total Bilirubin: 0.8 mg/dL (ref 0.3–1.2)
Total Protein: 6.9 g/dL (ref 6.5–8.1)

## 2019-10-28 LAB — LIPASE, BLOOD: Lipase: 15 U/L (ref 11–51)

## 2019-10-28 LAB — TROPONIN I (HIGH SENSITIVITY): Troponin I (High Sensitivity): 6 ng/L (ref <18)

## 2019-10-28 LAB — CBG MONITORING, ED: Glucose-Capillary: 117 mg/dL — ABNORMAL HIGH (ref 70–99)

## 2019-10-28 LAB — ETHANOL: Alcohol, Ethyl (B): <10 mg/dL (ref <10)

## 2019-10-28 LAB — POC SARS CORONAVIRUS 2 AG -  ED: SARS Coronavirus 2 Ag: NEGATIVE

## 2019-10-28 MED ORDER — SODIUM CHLORIDE 0.9 % IV BOLUS
1000.0000 mL | Freq: Once | INTRAVENOUS | Status: AC
  Administered 2019-10-28: 1000 mL via INTRAVENOUS

## 2019-10-28 MED ORDER — AZITHROMYCIN 250 MG PO TABS: 250.0000 mg | ORAL_TABLET | Freq: Every day | ORAL | 0 refills | Status: AC

## 2019-10-28 MED ORDER — SODIUM CHLORIDE 0.9 % IV SOLN
500.0000 mg | Freq: Once | INTRAVENOUS | Status: AC
  Administered 2019-10-28: 500 mg via INTRAVENOUS
  Filled 2019-10-28: qty 500

## 2019-10-28 MED ORDER — DEXAMETHASONE SODIUM PHOSPHATE 10 MG/ML IJ SOLN
10.0000 mg | Freq: Once | INTRAMUSCULAR | Status: AC
  Administered 2019-10-28: 10 mg via INTRAVENOUS
  Filled 2019-10-28: qty 1

## 2019-10-28 MED ORDER — ACETAMINOPHEN 500 MG PO TABS
1000.0000 mg | ORAL_TABLET | Freq: Once | ORAL | Status: AC
  Administered 2019-10-28: 1000 mg via ORAL
  Filled 2019-10-28: qty 2

## 2019-10-28 MED ORDER — SODIUM CHLORIDE 0.9 % IV SOLN
1.0000 g | Freq: Once | INTRAVENOUS | Status: AC
  Administered 2019-10-28: 1 g via INTRAVENOUS
  Filled 2019-10-28: qty 10

## 2019-10-28 MED ORDER — CEPHALEXIN 500 MG PO CAPS: 500.0000 mg | ORAL_CAPSULE | Freq: Two times a day (BID) | ORAL | 0 refills | Status: AC

## 2019-10-28 MED ORDER — PREDNISONE 20 MG PO TABS: 40.0000 mg | ORAL_TABLET | Freq: Every day | ORAL | 0 refills | Status: DC

## 2019-10-28 MED ORDER — ALBUTEROL SULFATE HFA 108 (90 BASE) MCG/ACT IN AERS
2.0000 | INHALATION_SPRAY | Freq: Four times a day (QID) | RESPIRATORY_TRACT | Status: DC
  Administered 2019-10-28: 2 via RESPIRATORY_TRACT
  Filled 2019-10-28: qty 6.7

## 2019-10-28 NOTE — Discharge Instructions (Signed)
As discussed, you have been diagnosed with pneumonia. You are starting antibiotics, and we will can need to continue these on discharge. Please monitor your condition carefully, and do not hesitate to return if you develop new, or concerning changes. Otherwise follow-up with your physician.

## 2019-10-28 NOTE — ED Triage Notes (Signed)
Pt arrives from home with complaints of right lateral  sided chest pain 8/10 X 2 days. Laying down increases pain and causes SOB. Pt reports foul smelling urine.   324mg  aspirin given by EMS  134/64 HR 110 RR 18 97% Ra

## 2019-10-28 NOTE — ED Provider Notes (Signed)
[facility] EMERGENCY DEPARTMENT Provider Note   CSN: [id number] Arrival date & time: 10/28/19  4696     History Chief Complaint  Patient presents with  . Chest Pain    Kim Ross is a 64 y.o. female.  HPI    Patient presents with right-sided chest pain. Onset was about 2 days ago, though she notes that she has had illness, URI-like with cough for greater than 1 week. She has a history of smoking, denies history of cardiac disease.  In the context of URI-like illness she had a Covid test 5 days ago that was reportedly negative. 2 days ago, after a coughing episode she felt worsening pain in the right lower chest wall.  None since that time it has been persistent.  There is associated nausea, anorexia and she has been intolerant of oral medication since yesterday. She did have some transient relief with Tylenol yesterday, however. No fever, no syncope, no swelling, no weight gain.  Past Medical History:  Diagnosis Date  . Anxiety   . Depression   . History of recurrent UTIs     Patient Active Problem List   Diagnosis Date Noted  . Hyperglycemia 09/05/2019  . Goiter 09/05/2019  . Visit for TB skin test 09/05/2019  . Onychomycosis of left great toe 09/05/2019  . Hospital discharge follow-up 08/15/2019  . Acute encephalopathy 08/06/2019  . Cocaine abuse ([place])   . Drug intoxication ([place]) 08/05/2019  . Aspiration pneumonitis ([place]) 08/05/2019  . Acute respiratory failure with hypoxia ([place]) 08/05/2019  . Encounter for screening for HIV 08/05/2019  . UTI symptoms 10/26/2018  . Seasonal allergies 12/30/2017  . Hyperlipidemia 12/30/2017  . Breast cancer screening 12/30/2017  . Screening for colon cancer 12/30/2017  . Right elbow pain 06/30/2017  . UTI (urinary tract infection) 02/09/2017  . Healthcare maintenance 09/30/2016  . Hematuria 04/01/2015  . History of recurrent UTIs 01/31/2014  . Vertigo, intermittent 06/27/2013  . Generalized anxiety  disorder 04/10/2013  . Irritable bowel syndrome 10/19/2012  . 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 Tobacco use disorder 07/20/2012    Past Surgical History:  Procedure Laterality Date  . ABDOMINAL HYSTERECTOMY    . HEMORRHOID SURGERY       OB History    Gravida  1   Para      Term      Preterm      AB      Living  1     SAB      TAB      Ectopic      Multiple      Live Births  1           Family History  Problem Relation Age of Onset  . Asthma Mother   . Cancer Sister 3       Pancreatic    Social History   Tobacco Use  . Smoking status: Current Every Day Smoker    Packs/day: 0.50    Types: Cigarettes  . Smokeless tobacco: Never Used  Substance Use Topics  . Alcohol use: Yes    Alcohol/week: 1.0 standard drinks    Types: 1 Cans of beer per week  . Drug use: No    Home Medications Prior to Admission medications   Medication Sig Start Date End Date Taking? Authorizing Provider  amoxicillin-clavulanate (AUGMENTIN) 875-125 MG tablet Take 1 tablet by mouth every 12 (twelve) hours. 08/08/19   Caren Griffins, MD  atorvastatin (LIPITOR) 40 MG tablet  TAKE 1 TABLET BY MOUTH EVERY DAY Patient taking differently: Take 40 mg by mouth daily.  10/31/18   Daisy Floro, DO  cephALEXin (KEFLEX) 500 MG capsule Take 1 capsule (500 mg total) by mouth 4 (four) times daily for 5 days. 10/27/19 11/01/19  Daisy Floro, DO  meclizine (ANTIVERT) 25 MG tablet Take 1 tablet (25 mg total) by mouth 2 (two) times daily. 09/03/19   Daisy Floro, DO  Multiple Vitamins-Minerals (MULTIVITAMIN WITH MINERALS) tablet Take 1 tablet by mouth daily. 04/27/13   Olam Idler, MD  nicotine (NICODERM CQ - DOSED IN MG/24 HR) 7 mg/24hr patch Place 1 patch (7 mg total) onto the skin daily. 09/04/19   Daisy Floro, DO  sertraline (ZOLOFT) 50 MG tablet Take 1 tablet (50 mg total) by mouth daily. 09/03/19   Daisy Floro, DO    Allergies    Ibuprofen and Trazodone and  nefazodone  Review of Systems   Review of Systems  Constitutional:       Per HPI, otherwise negative  HENT:       Per HPI, otherwise negative  Respiratory:       Per HPI, otherwise negative  Cardiovascular:       Per HPI, otherwise negative  Gastrointestinal: Positive for nausea and vomiting. Negative for abdominal pain.  Endocrine:       Negative aside from HPI  Genitourinary:       Neg aside from HPI   Musculoskeletal:       Per HPI, otherwise negative  Skin: Negative.   Neurological: Positive for weakness. Negative for syncope.    Physical Exam Updated Vital Signs BP (!) 113/99   Pulse (!) 103   Temp 99.7 F (37.6 C) (Oral)   Resp (!) 31   Ht 5\' 5"  (1.651 m)   Wt 72.6 kg   SpO2 96% Comment: room air  BMI 26.63 kg/m   Physical Exam Vitals and nursing note reviewed.  Constitutional:      Appearance: She is ill-appearing.     Comments: Uncomfortable appearing adult female awake and alert.  HENT:     Head: Normocephalic and atraumatic.  Eyes:     Conjunctiva/sclera: Conjunctivae normal.  Cardiovascular:     Rate and Rhythm: Regular rhythm. Tachycardia present.  Pulmonary:     Breath sounds: No stridor. Decreased breath sounds and wheezing present.  Abdominal:     General: There is no distension.  Skin:    General: Skin is warm and dry.  Neurological:     Mental Status: She is alert and oriented to person, place, and time.     Cranial Nerves: No cranial nerve deficit.     ED Results / Procedures / Treatments   Labs (all labs ordered are listed, but only abnormal results are displayed) Labs Reviewed  CBC - Abnormal; Notable for the following components:      Result Value   WBC 24.4 (*)    All other components within normal limits  COMPREHENSIVE METABOLIC PANEL - Abnormal; Notable for the following components:   Glucose, Bld 135 (*)    Calcium 8.7 (*)    Albumin 2.6 (*)    Alkaline Phosphatase 135 (*)    All other components within normal limits    CBG MONITORING, ED - Abnormal; Notable for the following components:   Glucose-Capillary 117 (*)    All other components within normal limits  ETHANOL  LIPASE, BLOOD  POC SARS CORONAVIRUS 2 AG -  ED  TROPONIN I (HIGH SENSITIVITY)  TROPONIN I (HIGH SENSITIVITY)   Cardiac monitor rate 110 sinus tach abnormal  EKG EKG Interpretation  Date/Time:  Sunday October 28 2019 09:43:36 EST Ventricular Rate:  109 PR Interval:    QRS Duration: 67 QT Interval:  404 QTC Calculation: 545 R Axis:   50 Text Interpretation: Sinus tachycardia Nonspecific repol abnormality, diffuse leads Prolonged QT interval ST-t wave abnormality Artifact Abnormal ECG Confirmed by Carmin Muskrat [id number]) on 10/28/2019 10:01:42 AM   Radiology DG Ribs Unilateral W/Chest Right  Result Date: 10/28/2019 CLINICAL DATA:  Pain at RIGHT lower chest after coughing question rib fractures, now pain increases when lying down, associated shortness of breath, also has foul-smelling urine, negative COVID-19 test on Monday, smoker EXAM: RIGHT RIBS AND CHEST - 3+ VIEW COMPARISON:  Chest radiograph 08/05/2019 FINDINGS: Normal heart size, mediastinal contours, and pulmonary vascularity. RIGHT middle lobe consolidation consistent with pneumonia. Additional mild infiltrate RIGHT lower lobe. LEFT lung clear. Small RIGHT pleural effusion. No pneumothorax. Bones appear slightly demineralized. BB placed at site of symptoms lower lateral RIGHT chest. No definite rib fracture or bone destruction. IMPRESSION: RIGHT middle lobe consolidation consistent with pneumonia, with additional mild infiltrate in RIGHT lower lobe and small RIGHT pleural effusion. No definite rib fractures identified. Electronically Signed   By: Lavonia Dana M.D.   On: 10/28/2019 11:01    Procedures Procedures (including critical care time)  Medications Ordered in ED Medications  albuterol (VENTOLIN HFA) 108 (90 Base) MCG/ACT inhaler 2 puff (2 puffs Inhalation Given 10/28/19  1003)  azithromycin (ZITHROMAX) 500 mg in sodium chloride 0.9 % 250 mL IVPB (has no administration in time range)  sodium chloride 0.9 % bolus 1,000 mL (1,000 mLs Intravenous New Bag/Given 10/28/19 1004)  dexamethasone (DECADRON) injection 10 mg (10 mg Intravenous Given 10/28/19 1003)  cefTRIAXone (ROCEPHIN) 1 g in sodium chloride 0.9 % 100 mL IVPB (1 g Intravenous New Bag/Given 10/28/19 1132)    ED Course  I have reviewed the triage vital signs and the nursing notes.  Pertinent labs & imaging results that were available during my care of the patient were reviewed by me and considered in my medical decision making (see chart for details).  Consideration of ACS versus pneumonia versus other infection, and suspicion of chest wall strain versus rib fracture secondary to coughing, patient had broad differential as above considered with labs, x-ray, EKG. Patient will receive fluids, steroids, albuterol given her increased work of breathing and wheezing on exam.  Patient has allergy to NSAIDs.  On initial cardiac monitor rate is 105, sinus tach, abnormal  12:15 PM 12:15 PM On repeat exam the patient is awake, alert, sitting on the edge of the bed, eating, no distress.  On we discussed all findings.  Findings consistent with right-sided pneumonia.  Repeat Covid testing is negative.  With no new oxygen requirement, patient is appropriate for discharge. She has already received initial antibiotics via IV, as well as steroids and albuterol given her history of COPD, smoking. Vital signs unremarkable, with persistent sinus tachycardia, rate 105, likely secondary to her pneumonia.  With no hemodynamic stability, demonstrated tolerance of antibiotics, the patient is discharged in stable condition to follow-up with primary care. Final Clinical Impression(s) / ED Diagnoses Final diagnoses:  Community acquired pneumonia of right lower lobe of lung      Carmin Muskrat, MD 10/28/19 1218

## 2019-10-29 ENCOUNTER — Telehealth: Payer: Self-pay

## 2019-10-29 ENCOUNTER — Telehealth (INDEPENDENT_AMBULATORY_CARE_PROVIDER_SITE_OTHER): Payer: Self-pay | Admitting: Family Medicine

## 2019-10-29 DIAGNOSIS — R399 Unspecified symptoms and signs involving the genitourinary system: Secondary | ICD-10-CM

## 2019-10-29 NOTE — Telephone Encounter (Signed)
Made appt. For pt. At GI location 301. On 10/30/19 at 12:40pm. Salvatore Marvel, CMA

## 2019-10-29 NOTE — Telephone Encounter (Signed)
Attempted to reach pt. To inform of appt at GI on Tue. Feb 2nd at 2:10pm. Lvm. Salvatore Marvel, CMA

## 2019-10-29 NOTE — Telephone Encounter (Signed)
Appt was made at GI 301 location. For 10/30/2019 at 12:40pm

## 2019-10-30 ENCOUNTER — Other Ambulatory Visit: Payer: Medicaid Other

## 2019-11-04 NOTE — Progress Notes (Signed)
  [place] Telemedicine Visit  Patient consented to have virtual visit. Method of visit: Telephone  Encounter participants: Patient: Kim Ross - located at Home Provider: Daisy Floro - located at [facility] Others (if applicable): None  Chief Complaint: Concerns for UTI  HPI: Patient calls in with concerns for UTI.  The visit was performed virtually due to patient having upper respiratory symptoms concerning for Covid.  Over the phone patient stated she was having suprapubic pain, urinary frequency, and pain with urination.  She denied fevers, chills, body aches, nausea, vomiting, vaginal discharge, pelvic pain, and blood in urine. Her last Urine Cx in January 2020 (1 year prior) grew pan-sensitive Proteus mirabilis.  Patient states her current symptoms mimic her symptoms when she had her last urinary tract infection.  ROS: per HPI  Pertinent PMHx:  Patient Active Problem List   Diagnosis Date Noted  . Hyperglycemia 09/05/2019  . Goiter 09/05/2019  . Visit for TB skin test 09/05/2019  . Onychomycosis of left great toe 09/05/2019  . Hospital discharge follow-up 08/15/2019  . Acute encephalopathy 08/06/2019  . Cocaine abuse ([place])   . Drug intoxication ([place]) 08/05/2019  . Aspiration pneumonitis ([place]) 08/05/2019  . Acute respiratory failure with hypoxia ([place]) 08/05/2019  . Encounter for screening for HIV 08/05/2019  . UTI symptoms 10/26/2018  . Seasonal allergies 12/30/2017  . Hyperlipidemia 12/30/2017  . Breast cancer screening 12/30/2017  . Screening for colon cancer 12/30/2017  . Right elbow pain 06/30/2017  . UTI (urinary tract infection) 02/09/2017  . Healthcare maintenance 09/30/2016  . Hematuria 04/01/2015  . History of recurrent UTIs 01/31/2014  . Vertigo, intermittent 06/27/2013  . Generalized anxiety disorder 04/10/2013  . Irritable bowel syndrome 10/19/2012  . 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 Tobacco use disorder  07/20/2012   Exam:  Respiratory: Speaking in full sentences, occasional cough appreciated over the phone  Assessment/Plan: UTI symptoms As patient is unable to come into clinic for urinalysis, will treat based off of prior urine culture results. -Keflex 500 mg twice daily x5 days -Encourage patient to take probiotic during treatment -Return precautions given for fever, flank pain, body aches, and chills.   Time spent during visit with patient: 11 minutes  Dr. Milus Banister [facility], PGY-2

## 2019-11-04 NOTE — Assessment & Plan Note (Signed)
As patient is unable to come into clinic for urinalysis, will treat based off of prior urine culture results. -Keflex 500 mg twice daily x5 days -Encourage patient to take probiotic during treatment -Return precautions given for fever, flank pain, body aches, and chills.

## 2019-11-05 ENCOUNTER — Telehealth (INDEPENDENT_AMBULATORY_CARE_PROVIDER_SITE_OTHER): Payer: Self-pay | Admitting: Student in an Organized Health Care Education/Training Program

## 2019-11-05 ENCOUNTER — Other Ambulatory Visit: Payer: Self-pay

## 2019-11-05 ENCOUNTER — Telehealth: Payer: Self-pay

## 2019-11-05 DIAGNOSIS — R0789 Other chest pain: Secondary | ICD-10-CM | POA: Insufficient documentation

## 2019-11-05 HISTORY — DX: Other chest pain: R07.89

## 2019-11-05 MED ORDER — LIDOCAINE 5 % EX PTCH: 1.0000 | MEDICATED_PATCH | CUTANEOUS | 0 refills | Status: DC

## 2019-11-05 MED ORDER — ABDOMINAL BINDER/ELASTIC LARGE MISC: 1.0000 | Freq: Every day | 0 refills | Status: DC

## 2019-11-05 NOTE — Assessment & Plan Note (Signed)
Right flank chest wall pain associated with coughing likely MSK in nature. X-ray was negative for definitive rib fractures. Recommended patient treat with topical pain medications such as lidocaine patches and employee compression wrap such as Ace bandage to put pressure on the area in case of rib fracture.  Since pain is improving, hopefully she will have greater improvement with these measures but if not, advised patient to call back and consider repeat imaging to assess for occult fracture.

## 2019-11-05 NOTE — Progress Notes (Signed)
[place] Telemedicine Visit  Patient consented to have virtual visit. Method of visit: Telephone  Encounter participants: Patient: Kim Ross - located at home Provider: Richarda Osmond - located at [facility] Others (if applicable): none  Chief Complaint: chest wall pain while coughing   HPI:  Patient evaluated on 1/24 for chest pain associated with cough and URI symptoms.  ACS was ruled out and she was diagnosed with pneumonia.  Patient has been treated with steroids, albuterol for COPD history as well as 1 dose of IV antibiotics in the emergency department and p.o. antibiotics for which she has 1 day remaining and has been adherent with the dosage.  She was discharged with stable vital signs.  Patient's respiratory status has been improved however, she continues to have a cough which has associated right-sided chest wall pain.  The pain is nonradiating, nonexertional, not associated with dyspnea.  Denies any fevers or coughing up blood.  She believes that the pain has been improving but would like to be treated for this.   ROS: per HPI  Pertinent PMHx: Pneumonia diagnosed 1/24, history of COPD and tobacco use.  Exam:  Respiratory: dry cough throughout encounter. Speaking in full sentences without dyspnea.  No audible wheezing.  Assessment/Plan:  Chest wall pain Right flank chest wall pain associated with coughing likely MSK in nature. X-ray was negative for definitive rib fractures. Recommended patient treat with topical pain medications such as lidocaine patches and employee compression wrap such as Ace bandage to put pressure on the area in case of rib fracture.  Since pain is improving, hopefully she will have greater improvement with these measures but if not, advised patient to call back and consider repeat imaging to assess for occult fracture.    Time spent during visit with patient: 7 minutes

## 2019-11-05 NOTE — Telephone Encounter (Signed)
Patient calls nurse line with concerns about pneumonia. Patient was seen in the ED on 10/28/19 for pneumonia. Patient states that symptoms have not gotten any better and that she is still having a lot of chest congestion and pain in her chest when she coughs. Requested to speak to doctor about medication management.   Talbot Grumbling, RN

## 2019-11-06 ENCOUNTER — Other Ambulatory Visit: Payer: Medicaid Other

## 2019-11-07 ENCOUNTER — Telehealth: Payer: Self-pay | Admitting: *Deleted

## 2019-11-07 NOTE — Telephone Encounter (Signed)
Received fax from pharmacy, PA needed on lidocaine 5% patches.  Clinical questions submitted via Cover My Meds.  Waiting on response, could take up to 72 hours.  Cover My Meds info: Key: [id number]  Christen Bame, CMA

## 2019-11-09 NOTE — Telephone Encounter (Signed)
Prior Auth for Lidocaine patches were denied. Per pharmacist, the 4% patches are OTC and will cost her 12 dollars for a box of five. However, patient would prefer to have something covered by her insurance. Pharmacist suggested Gabapentin, or the patient will have to buy patches OTC. Please advise.

## 2019-11-12 NOTE — Telephone Encounter (Signed)
I would not prescribe gabapentin. I would suggest using the OTC lidocaine patches which are the same as the prescription. I'm sorry that it's not covered.

## 2019-11-14 NOTE — Telephone Encounter (Signed)
LMOVM for callback. Christen Bame, CMA

## 2019-11-15 NOTE — Telephone Encounter (Signed)
Patient returned phone call and informed of below.   Talbot Grumbling, RN

## 2019-11-16 ENCOUNTER — Telehealth: Payer: Self-pay

## 2019-11-16 NOTE — Telephone Encounter (Signed)
Patient calls nurse line regarding safety of COVID vaccine. Patient states that she has recently had pneumonia and wants to make sure it is safe for her to receive. Went over algorithm with patient, no other contraindications. Advised patient that it should be okay for her to receive vaccine but will ask provider for confirmation.   To PCP for clarification  Talbot Grumbling, RN

## 2019-11-19 NOTE — Telephone Encounter (Signed)
Pt informed of below.   Talbot Grumbling, RN

## 2019-11-30 ENCOUNTER — Telehealth: Payer: Self-pay | Admitting: *Deleted

## 2019-11-30 NOTE — Telephone Encounter (Signed)
Pt is requesting some meds for a UTI.  Advised that she will likely need an appt but she states "no my doctor knows me and knows that I get them often".  Pt declines appt here (none available) or @ [facility].  She would like for me to send a message to MD.  Christen Bame, CMA

## 2019-12-03 NOTE — Telephone Encounter (Signed)
Pt is really needing the medication for her UTI, She is still getting over the pneumonia. She is always getting UTI. PLease call her right away. jw

## 2019-12-03 NOTE — Telephone Encounter (Signed)
Will forward to MD. Enijah Furr,CMA  

## 2019-12-03 NOTE — Telephone Encounter (Signed)
Patient calls nurse line to follow up on receiving antibiotics for UTI. Patient reports odor and burning with urination. Patient states that she frequently gets UTIs and does not want to be seen in clinic.   To PCP  Please advise  Talbot Grumbling, RN

## 2019-12-04 ENCOUNTER — Other Ambulatory Visit: Payer: Self-pay

## 2019-12-04 ENCOUNTER — Encounter: Payer: Self-pay | Admitting: Family Medicine

## 2019-12-04 ENCOUNTER — Ambulatory Visit (INDEPENDENT_AMBULATORY_CARE_PROVIDER_SITE_OTHER): Payer: Self-pay | Admitting: Family Medicine

## 2019-12-04 VITALS — BP 148/72 | HR 102 | Wt 160.0 lb

## 2019-12-04 DIAGNOSIS — R399 Unspecified symptoms and signs involving the genitourinary system: Secondary | ICD-10-CM

## 2019-12-04 LAB — POCT URINALYSIS DIP (MANUAL ENTRY)
Bilirubin, UA: NEGATIVE
Glucose, UA: NEGATIVE mg/dL
Ketones, POC UA: NEGATIVE mg/dL
Nitrite, UA: NEGATIVE
Protein Ur, POC: 100 mg/dL — AB
Spec Grav, UA: 1.02 (ref 1.010–1.025)
Urobilinogen, UA: 1 E.U./dL
pH, UA: 7 (ref 5.0–8.0)

## 2019-12-04 LAB — POCT UA - MICROSCOPIC ONLY

## 2019-12-04 MED ORDER — CEPHALEXIN 500 MG PO CAPS: 500.0000 mg | ORAL_CAPSULE | Freq: Three times a day (TID) | ORAL | 0 refills | Status: AC

## 2019-12-04 NOTE — Assessment & Plan Note (Signed)
UA consistent with urinary tract infection, coupled with history.  Will treat with Keflex 500 mg 3 times daily for 5 days.  Will get urine culture just to confirm diagnosis, given the hematuria present.

## 2019-12-04 NOTE — Patient Instructions (Signed)
It was great seeing you today! I am sorry about your urinary tract infection. I will send in an antibiotic called keflex. It is 500mg  three times a day. It wont interview with your covid vaccine so I strongly recommend for you to keep your appointment for this. I will be in touch if something abnormal comes back on the culture.

## 2019-12-04 NOTE — Progress Notes (Signed)
   CHIEF COMPLAINT / HPI: 64 year old female who presents for 1 day history of burning with urination, frequency of urination.  She has had no other accompanying symptoms, including fever, back pain.  States this feels like a UTI.  PERTINENT  PMH / PSH:    OBJECTIVE: BP (!) 148/72   Pulse (!) 102   Wt 160 lb (72.6 kg)   SpO2 98%   BMI 26.63 kg/m   Gen: Very pleasant 64 year old African-American female, no acute distress CV: Skin warm and dry Resp: No respiratory distress, no accessory muscle use Neuro: Alert and oriented, Speech clear, No gross deficits  UA: Significant for large bacteria, leukocytes, moderate blood. High-power field: Continue to count leukocytes, 1-5 RBC, 3+ bacteria, 1-5 epithelial cells  ASSESSMENT / PLAN:  UTI symptoms UA consistent with urinary tract infection, coupled with history.  Will treat with Keflex 500 mg 3 times daily for 5 days.  Will get urine culture just to confirm diagnosis, given the hematuria present.     Guadalupe Dawn, MD [place]

## 2020-01-17 ENCOUNTER — Other Ambulatory Visit: Payer: Self-pay

## 2020-01-18 MED ORDER — ATORVASTATIN CALCIUM 40 MG PO TABS: 40.0000 mg | ORAL_TABLET | Freq: Every day | ORAL | 3 refills | Status: DC

## 2020-03-26 ENCOUNTER — Other Ambulatory Visit: Payer: Self-pay

## 2020-03-26 ENCOUNTER — Encounter: Payer: Self-pay | Admitting: Family Medicine

## 2020-03-26 ENCOUNTER — Ambulatory Visit (INDEPENDENT_AMBULATORY_CARE_PROVIDER_SITE_OTHER): Payer: Self-pay | Admitting: Family Medicine

## 2020-03-26 VITALS — BP 128/80 | HR 105 | Ht 65.0 in | Wt 164.6 lb

## 2020-03-26 DIAGNOSIS — N3001 Acute cystitis with hematuria: Secondary | ICD-10-CM

## 2020-03-26 DIAGNOSIS — R399 Unspecified symptoms and signs involving the genitourinary system: Secondary | ICD-10-CM

## 2020-03-26 LAB — POCT URINALYSIS DIP (MANUAL ENTRY)
Bilirubin, UA: NEGATIVE
Glucose, UA: NEGATIVE mg/dL
Ketones, POC UA: NEGATIVE mg/dL
Leukocytes, UA: NEGATIVE
Nitrite, UA: POSITIVE — AB
Protein Ur, POC: NEGATIVE mg/dL
Spec Grav, UA: >=1.03 — AB (ref 1.010–1.025)
Urobilinogen, UA: 0.2 E.U./dL
pH, UA: 6 (ref 5.0–8.0)

## 2020-03-26 MED ORDER — CEPHALEXIN 500 MG PO CAPS: 500.0000 mg | ORAL_CAPSULE | Freq: Two times a day (BID) | ORAL | 0 refills | Status: AC

## 2020-03-26 NOTE — Progress Notes (Signed)
    SUBJECTIVE:   CHIEF COMPLAINT / HPI:   Concern for change in urine odor and frequency Patient states last week she began feeling "sluggish" and then noticed a change in her urine odor. She denies any urinary burning or pain but does have frequency and urgency. She gets UTIs "a lot". She denies fever, chills, nausea, vomiting, flank pain.  PERTINENT  PMH / PSH: Hx of recurrent UTI  OBJECTIVE:   BP 128/80   Pulse (!) 105   Ht 5\' 5"  (1.651 m)   Wt 164 lb 9.6 oz (74.7 kg)   SpO2 98%   BMI 27.39 kg/m   Gen: NAD Cardiac: RRR no murmurs Resp: CTAB  ASSESSMENT/PLAN:   UTI (urinary tract infection) Symptoms, history, and U/A all suggestive simple cystitis - Kelfex 500mg  QID for 7 days     Nuala Alpha, DO [place]

## 2020-03-26 NOTE — Patient Instructions (Signed)
Urinary Tract Infection, Adult A urinary tract infection (UTI) is an infection of any part of the urinary tract. The urinary tract includes:  The kidneys.  The ureters.  The bladder.  The urethra. These organs make, store, and get rid of pee (urine) in the body. What are the causes? This is caused by germs (bacteria) in your genital area. These germs grow and cause swelling (inflammation) of your urinary tract. What increases the risk? You are more likely to develop this condition if:  You have a small, thin tube (catheter) to drain pee.  You cannot control when you pee or poop (incontinence).  You are female, and: ? You use these methods to prevent pregnancy:  A medicine that kills sperm (spermicide).  A device that blocks sperm (diaphragm). ? You have low levels of a female hormone (estrogen). ? You are pregnant.  You have genes that add to your risk.  You are sexually active.  You take antibiotic medicines.  You have trouble peeing because of: ? A prostate that is bigger than normal, if you are female. ? A blockage in the part of your body that drains pee from the bladder (urethra). ? A kidney stone. ? A nerve condition that affects your bladder (neurogenic bladder). ? Not getting enough to drink. ? Not peeing often enough.  You have other conditions, such as: ? Diabetes. ? A weak disease-fighting system (immune system). ? Sickle cell disease. ? Gout. ? Injury of the spine. What are the signs or symptoms? Symptoms of this condition include:  Needing to pee right away (urgently).  Peeing often.  Peeing small amounts often.  Pain or burning when peeing.  Blood in the pee.  Pee that smells bad or not like normal.  Trouble peeing.  Pee that is cloudy.  Fluid coming from the vagina, if you are female.  Pain in the belly or lower back. Other symptoms include:  Throwing up (vomiting).  No urge to eat.  Feeling mixed up (confused).  Being tired  and grouchy (irritable).  A fever.  Watery poop (diarrhea). How is this treated? This condition may be treated with:  Antibiotic medicine.  Other medicines.  Drinking enough water. Follow these instructions at home:  Medicines  Take over-the-counter and prescription medicines only as told by your doctor.  If you were prescribed an antibiotic medicine, take it as told by your doctor. Do not stop taking it even if you start to feel better. General instructions  Make sure you: ? Pee until your bladder is empty. ? Do not hold pee for a long time. ? Empty your bladder after sex. ? Wipe from front to back after pooping if you are a female. Use each tissue one time when you wipe.  Drink enough fluid to keep your pee pale yellow.  Keep all follow-up visits as told by your doctor. This is important. Contact a doctor if:  You do not get better after 1-2 days.  Your symptoms go away and then come back. Get help right away if:  You have very bad back pain.  You have very bad pain in your lower belly.  You have a fever.  You are sick to your stomach (nauseous).  You are throwing up. Summary  A urinary tract infection (UTI) is an infection of any part of the urinary tract.  This condition is caused by germs in your genital area.  There are many risk factors for a UTI. These include having a small, thin   tube to drain pee and not being able to control when you pee or poop.  Treatment includes antibiotic medicines for germs.  Drink enough fluid to keep your pee pale yellow. This information is not intended to replace advice given to you by your health care provider. Make sure you discuss any questions you have with your health care provider. Document Revised: 09/07/2018 Document Reviewed: 03/30/2018 Elsevier Patient Education  2020 [employer]  

## 2020-03-28 LAB — URINE CULTURE

## 2020-03-28 NOTE — Assessment & Plan Note (Signed)
Symptoms, history, and U/A all suggestive simple cystitis - Kelfex 500mg  QID for 7 days

## 2020-05-26 ENCOUNTER — Other Ambulatory Visit: Payer: Self-pay

## 2020-05-26 DIAGNOSIS — Z1231 Encounter for screening mammogram for malignant neoplasm of breast: Secondary | ICD-10-CM

## 2020-05-29 ENCOUNTER — Other Ambulatory Visit: Payer: Self-pay

## 2020-05-29 ENCOUNTER — Ambulatory Visit
Admission: RE | Admit: 2020-05-29 | Discharge: 2020-05-29 | Disposition: A | Discharge status: Home / Self Care | Payer: No Typology Code available for payment source | Source: Ambulatory Visit | Attending: Family Medicine | Admitting: Family Medicine

## 2020-05-29 DIAGNOSIS — Z1231 Encounter for screening mammogram for malignant neoplasm of breast: Secondary | ICD-10-CM

## 2020-06-11 ENCOUNTER — Telehealth: Payer: Self-pay

## 2020-06-11 DIAGNOSIS — N3001 Acute cystitis with hematuria: Secondary | ICD-10-CM

## 2020-06-11 NOTE — Telephone Encounter (Signed)
Patient calls nurse line requesting an antibiotic for a recurrent UTI. Patient reports she can not come into the office due to quarantine, daughter was sent home on Monday for Covid sxs and everyone was told to quarantine. Patient endorses a strong urine odor and dark color and "all the other other symptoms." Due to quarantine I will send to PCP for advisement.   Please send to [facility] if appropriate.

## 2020-06-16 NOTE — Telephone Encounter (Signed)
Patient calls nurse line again. Patient states, "I still can not come in due to quarantine." Please advise on antibiotic. Symptoms have not changed or gotten worse per patient.

## 2020-06-17 MED ORDER — SULFAMETHOXAZOLE-TRIMETHOPRIM 800-160 MG PO TABS: 1.0000 | ORAL_TABLET | Freq: Two times a day (BID) | ORAL | 0 refills | Status: AC

## 2020-07-05 ENCOUNTER — Emergency Department (HOSPITAL_COMMUNITY): Payer: No Typology Code available for payment source

## 2020-07-05 ENCOUNTER — Other Ambulatory Visit: Payer: Self-pay

## 2020-07-05 ENCOUNTER — Emergency Department (HOSPITAL_BASED_OUTPATIENT_CLINIC_OR_DEPARTMENT_OTHER): Payer: Self-pay

## 2020-07-05 ENCOUNTER — Encounter (HOSPITAL_COMMUNITY): Payer: Self-pay | Admitting: Emergency Medicine

## 2020-07-05 ENCOUNTER — Emergency Department (HOSPITAL_COMMUNITY)
Admission: EM | Admit: 2020-07-05 | Discharge: 2020-07-05 | Disposition: A | Discharge status: Home / Self Care | Payer: No Typology Code available for payment source | Attending: Emergency Medicine | Admitting: Emergency Medicine

## 2020-07-05 DIAGNOSIS — M7989 Other specified soft tissue disorders: Secondary | ICD-10-CM

## 2020-07-05 DIAGNOSIS — M25562 Pain in left knee: Secondary | ICD-10-CM

## 2020-07-05 DIAGNOSIS — W19XXXA Unspecified fall, initial encounter: Secondary | ICD-10-CM

## 2020-07-05 DIAGNOSIS — W010XXA Fall on same level from slipping, tripping and stumbling without subsequent striking against object, initial encounter: Secondary | ICD-10-CM | POA: Insufficient documentation

## 2020-07-05 DIAGNOSIS — F17210 Nicotine dependence, cigarettes, uncomplicated: Secondary | ICD-10-CM | POA: Insufficient documentation

## 2020-07-05 DIAGNOSIS — M79652 Pain in left thigh: Secondary | ICD-10-CM | POA: Insufficient documentation

## 2020-07-05 DIAGNOSIS — M5432 Sciatica, left side: Secondary | ICD-10-CM

## 2020-07-05 DIAGNOSIS — S8002XA Contusion of left knee, initial encounter: Secondary | ICD-10-CM | POA: Insufficient documentation

## 2020-07-05 NOTE — Progress Notes (Signed)
VASCULAR LAB    Left lower extremity venous duplex has been performed.  See CV proc for preliminary results.  Gave report to [facility], PA-C  Eymi Lipuma, RVT 07/05/2020, 7:17 PM

## 2020-07-05 NOTE — ED Provider Notes (Signed)
[facility] DEPT Provider Note   CSN: [id number] Arrival date & time: 07/05/20  1809     History Chief Complaint  Patient presents with  . Fall  . Leg Pain    Kim Ross is a 64 y.o. female with a past medical history of anxiety, depression, who presents today for evaluation of pain in her left knee and thigh.  She reports that 2 to 3 days ago she had a mechanical, nonsyncopal fall when she tripped over her dog.  She landed on her left knee.  She denies striking her head or passing out.  She has had pain and swelling in the left knee today.  She reports that she had pain in the left thigh since last night.  She tried heat and BC powders without significant relief.  She denies any pain in her back.  No changes to bowel/bladder function.  No saddle anesthesia or paresthesia.  No weakness or numbness to bilateral upper and lower extremities.  She denies any pain in her abdomen or chest.    She  Does not take any blood thinning medications.   HPI     Past Medical History:  Diagnosis Date  . Anxiety   . Depression   . History of recurrent UTIs     Patient Active Problem List   Diagnosis Date Noted  . Chest wall pain 11/05/2019  . Hyperglycemia 09/05/2019  . Goiter 09/05/2019  . Visit for TB skin test 09/05/2019  . Onychomycosis of left great toe 09/05/2019  . Hospital discharge follow-up 08/15/2019  . Acute encephalopathy 08/06/2019  . Cocaine abuse ([place])   . Drug intoxication ([place]) 08/05/2019  . Aspiration pneumonitis ([place]) 08/05/2019  . Acute respiratory failure with hypoxia ([place]) 08/05/2019  . Encounter for screening for HIV 08/05/2019  . UTI symptoms 10/26/2018  . Seasonal allergies 12/30/2017  . Hyperlipidemia 12/30/2017  . Breast cancer screening 12/30/2017  . Screening for colon cancer 12/30/2017  . Right elbow pain 06/30/2017  . UTI (urinary tract infection) 02/09/2017  . Healthcare maintenance 09/30/2016  . Hematuria 04/01/2015    . History of recurrent UTIs 01/31/2014  . Vertigo, intermittent 06/27/2013  . Generalized anxiety disorder 04/10/2013  . Irritable bowel syndrome 10/19/2012  . 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 Tobacco use disorder 07/20/2012    Past Surgical History:  Procedure Laterality Date  . ABDOMINAL HYSTERECTOMY    . HEMORRHOID SURGERY       OB History    Gravida  1   Para      Term      Preterm      AB      Living  1     SAB      TAB      Ectopic      Multiple      Live Births  1           Family History  Problem Relation Age of Onset  . Asthma Mother   . Cancer Sister 40       Pancreatic    Social History   Tobacco Use  . Smoking status: Current Every Day Smoker    Packs/day: 0.50    Types: Cigarettes  . Smokeless tobacco: Never Used  Vaping Use  . Vaping Use: Never used  Substance Use Topics  . Alcohol use: Yes    Alcohol/week: 1.0 standard drink    Types: 1 Cans of beer per week  . Drug use: No  Home Medications Prior to Admission medications   Medication Sig Start Date End Date Taking? Authorizing Provider  atorvastatin (LIPITOR) 40 MG tablet Take 1 tablet (40 mg total) by mouth daily. 01/18/20   Daisy Floro, DO  Elastic Bandages & Supports (ABDOMINAL BINDER/ELASTIC LARGE) MISC 1 Package by Does not apply route daily. 11/05/19   Anderson, Chelsey L, DO  lidocaine (LIDODERM) 5 % Place 1 patch onto the skin daily. Remove & Discard patch within 12 hours or as directed by MD 11/05/19   Doristine Mango L, DO  meclizine (ANTIVERT) 25 MG tablet Take 1 tablet (25 mg total) by mouth 2 (two) times daily. 09/03/19   Daisy Floro, DO  Multiple Vitamins-Minerals (MULTIVITAMIN WITH MINERALS) tablet Take 1 tablet by mouth daily. 04/27/13   Olam Idler, MD  nicotine (NICODERM CQ - DOSED IN MG/24 HR) 7 mg/24hr patch Place 1 patch (7 mg total) onto the skin daily. 09/04/19   Daisy Floro, DO  predniSONE (DELTASONE) 20 MG tablet Take 2 tablets  (40 mg total) by mouth daily with breakfast. For the next four days 10/28/19   Carmin Muskrat, MD  sertraline (ZOLOFT) 50 MG tablet Take 1 tablet (50 mg total) by mouth daily. 09/03/19   Daisy Floro, DO    Allergies    Ibuprofen and Trazodone and nefazodone  Review of Systems   Review of Systems  Constitutional: Negative for fever.  Respiratory: Negative for shortness of breath.   Gastrointestinal: Negative for abdominal pain.  Musculoskeletal: Negative for back pain and neck pain.       Pain and swelling in left knee, pain in left posterior upper leg.   Skin: Positive for color change (Ecchymosis left anterior knee). Negative for wound.  Neurological: Negative for dizziness, syncope, weakness and headaches.  All other systems reviewed and are negative.   Physical Exam Updated Vital Signs BP 112/74   Pulse 91   Temp 98.3 F (36.8 C)   Resp 16   SpO2 100%   Physical Exam Vitals and nursing note reviewed.  Constitutional:      General: She is not in acute distress.    Appearance: She is not ill-appearing.  HENT:     Head: Normocephalic and atraumatic.  Cardiovascular:     Rate and Rhythm: Normal rate.     Pulses: Normal pulses.     Comments: 2+ PT pulses bilaterally. Pulmonary:     Effort: Pulmonary effort is normal. No respiratory distress.  Musculoskeletal:     Right lower leg: No edema.     Left lower leg: No edema.     Comments: Mild tenderness to palpation along left piriformis/buttock.  There is no obvious deformity around the knee however there is mild edema with ecchymosis on the anterior superior aspect.  5/5 dorsiflexion and plantar flexion bilaterally.  Left knee is grossly stable to anterior/posterior drawer test and valgus/varus stress.  Palpation along the left sided buttock/piriformis recreates her radiating pain down to her knee.  No midline C/T/L-spine tenderness palpation, step-offs, or deformities.  Skin:    General: Skin is warm and dry.      Comments: Ecchymosis over left anterior knee.  Neurological:     Mental Status: She is alert.     Sensory: No sensory deficit.     Motor: No weakness.     ED Results / Procedures / Treatments   Labs (all labs ordered are listed, but only abnormal results are displayed) Labs Reviewed - No data  to display  EKG None  Radiology DG Knee Complete 4 Views Left  Result Date: 07/05/2020 CLINICAL DATA:  Status post fall. EXAM: LEFT KNEE - COMPLETE 4+ VIEW COMPARISON:  None. FINDINGS: No evidence of fracture, dislocation, or joint effusion. No evidence of arthropathy or other focal bone abnormality. Soft tissues are unremarkable. IMPRESSION: Negative. Electronically Signed   By: Virgina Norfolk M.D.   On: 07/05/2020 19:04   VAS [place] LOWER EXTREMITY VENOUS (DVT) ([facility] and [facility] 7a-7p)  Result Date: 07/05/2020  Lower Venous DVT Study Indications: Knee pain and swelling.  Limitations: Edema, small vasculature. Comparison Study: No prior study on file Performing Technologist: Sharion Dove RVS  Examination Guidelines: A complete evaluation includes B-mode imaging, spectral Doppler, color Doppler, and power Doppler as needed of all accessible portions of each vessel. Bilateral testing is considered an integral part of a complete examination. Limited examinations for reoccurring indications may be performed as noted. The reflux portion of the exam is performed with the patient in reverse Trendelenburg.  +-----+---------------+---------+-----------+----------+--------------+ RIGHTCompressibilityPhasicitySpontaneityPropertiesThrombus Aging +-----+---------------+---------+-----------+----------+--------------+ CFV  Full           Yes      Yes                                 +-----+---------------+---------+-----------+----------+--------------+   +---------+---------------+---------+-----------+----------+--------------+ LEFT     CompressibilityPhasicitySpontaneityPropertiesThrombus Aging  +---------+---------------+---------+-----------+----------+--------------+ CFV      Full           Yes      Yes                                 +---------+---------------+---------+-----------+----------+--------------+ SFJ      Full                                                        +---------+---------------+---------+-----------+----------+--------------+ FV Prox  Full                                                        +---------+---------------+---------+-----------+----------+--------------+ FV Mid   Full                                                        +---------+---------------+---------+-----------+----------+--------------+ FV DistalFull                                                        +---------+---------------+---------+-----------+----------+--------------+ PFV      Full                                                        +---------+---------------+---------+-----------+----------+--------------+  POP      Full           Yes      Yes                                 +---------+---------------+---------+-----------+----------+--------------+ PTV      Full                                                        +---------+---------------+---------+-----------+----------+--------------+ PERO     Full                                                        +---------+---------------+---------+-----------+----------+--------------+     Summary: RIGHT: - No evidence of common femoral vein obstruction.  LEFT: - There is no evidence of deep vein thrombosis in the lower extremity. However, portions of this examination were limited- see technologist comments above.  - Not all segments of the posterior tibial and peroneal veins were insonated secondary to small vasculature  *See table(s) above for measurements and observations.    Preliminary     Procedures Procedures (including critical care time)  Medications Ordered in  ED Medications - No data to display  ED Course  I have reviewed the triage vital signs and the nursing notes.  Pertinent labs & imaging results that were available during my care of the patient were reviewed by me and considered in my medical decision making (see chart for details).    MDM Rules/Calculators/A&P                          Patient is a 64 year old woman who presents today for evaluation of a nonsyncopal mechanical fall over her dog a few days ago.  On exam she has mild tenderness to palpation over the left buttocks which recreates and exacerbates the pain down to the left knee.  She denies striking her head or passing out.  Aside from her left leg she denies any other injuries and does not take any blood thinning medications.  Knee x-rays were obtained in triage without acute abnormality.  DVT study was obtained from triage without evidence of DVT.  Patient appears neurovascularly intact.  Suspect contusion with a element of piriformis syndrome causing sciatica.    She does not have any midline spine pain or pain in her back to warrant imaging.  No changes to bowel or bladder function, saddle anesthesias or paresthesias, doubt cauda equina syndrome.  Recommended conservative care including rest, RICE, gentle stretching, gentle range of motion and PCP follow-up.    Return precautions were discussed with patient who states their understanding.  At the time of discharge patient denied any unaddressed complaints or concerns.  Patient is agreeable for discharge home.  Note: Portions of this report may have been transcribed using voice recognition software. Every effort was made to ensure accuracy; however, inadvertent computerized transcription errors may be present  Final Clinical Impression(s) / ED Diagnoses Final diagnoses:  Fall, initial encounter  Sciatica of left side    Rx / DC Orders ED Discharge  Orders    None       Ollen Gross 07/05/20 2209     Nat Christen, MD 07/06/20 1330

## 2020-07-05 NOTE — ED Triage Notes (Addendum)
Pt reports falling Thursday and hitting left knee. Reports pain and swelling in left knee today. No swelling to lower leg noted, leg warm and dry. Reports taking tylenol last night with no relief. Hx of blood clots.

## 2020-08-12 ENCOUNTER — Ambulatory Visit (INDEPENDENT_AMBULATORY_CARE_PROVIDER_SITE_OTHER): Payer: Self-pay | Admitting: Family Medicine

## 2020-08-12 ENCOUNTER — Other Ambulatory Visit: Payer: Self-pay

## 2020-08-12 VITALS — BP 110/56 | HR 91 | Ht 65.0 in | Wt 165.2 lb

## 2020-08-12 DIAGNOSIS — R399 Unspecified symptoms and signs involving the genitourinary system: Secondary | ICD-10-CM

## 2020-08-12 DIAGNOSIS — Z23 Encounter for immunization: Secondary | ICD-10-CM

## 2020-08-12 DIAGNOSIS — R319 Hematuria, unspecified: Secondary | ICD-10-CM

## 2020-08-12 DIAGNOSIS — R8290 Unspecified abnormal findings in urine: Secondary | ICD-10-CM

## 2020-08-12 LAB — POCT URINALYSIS DIP (MANUAL ENTRY)
Bilirubin, UA: NEGATIVE
Glucose, UA: NEGATIVE mg/dL
Ketones, POC UA: NEGATIVE mg/dL
Leukocytes, UA: NEGATIVE
Nitrite, UA: NEGATIVE
Protein Ur, POC: NEGATIVE mg/dL
Spec Grav, UA: >=1.03 — AB (ref 1.010–1.025)
Urobilinogen, UA: 1 E.U./dL
pH, UA: 5.5 (ref 5.0–8.0)

## 2020-08-12 NOTE — Assessment & Plan Note (Signed)
Assessment: 64 year old female with a smoking history for possible UTI and found hematuria which has been consistent on previous UAs.  Per patient she has previously gone to urology but she does not believe she ever had a cystoscopy performed.  Per chart review it looks like the patient previously had to stop seeing urology due to insurance issues and I do not see any signs of a cystoscopy was performed.  Discussed this with patient.  Differential can include cystitis as a cause of the hematuria, of course bladder cancer is also on the differential in a smoker of her age group who has painless hematuria Plan: -Patient is interested in seeing urology again for the purpose of having a cystoscopy performed -I have referred patient to urology.

## 2020-08-12 NOTE — Progress Notes (Signed)
    SUBJECTIVE:   CHIEF COMPLAINT / HPI:   Concern for UTI: Patient is a 64 year old female the presents today for concern of urinary tract infection.  She states for about the past 2 weeks she had noted a stronger smell to her urine and some back pain. She states she has a history of UTI's and since she was coming in for her flu shot today she decided she should be evaluated for possible UTI. Denies fevers, denies dysuria. Endorses some suprapubic discomfort.  She states that she has had good success with Keflex in the past.  PERTINENT  PMH / PSH: Hx of urinary infections  OBJECTIVE:   BP (!) 110/56   Pulse 91   Ht 5\' 5"  (1.651 m)   Wt 165 lb 4 oz (75 kg)   BMI 27.50 kg/m    General: NAD, pleasant, able to participate in exam Cardiac: RRR, no murmurs. Respiratory: CTAB, normal effort Abdomen: Bowel sounds present, nontender, no suprapubic discomfort on palpation.  Negative CVA tenderness bilaterally.  ASSESSMENT/PLAN:   Hematuria Assessment: 64 year old female with a smoking history for possible UTI and found hematuria which has been consistent on previous UAs.  Per patient she has previously gone to urology but she does not believe she ever had a cystoscopy performed.  Per chart review it looks like the patient previously had to stop seeing urology due to insurance issues and I do not see any signs of a cystoscopy was performed.  Discussed this with patient.  Differential can include cystitis as a cause of the hematuria, of course bladder cancer is also on the differential in a smoker of her age group who has painless hematuria Plan: -Patient is interested in seeing urology again for the purpose of having a cystoscopy performed -I have referred patient to urology.  Abnormal urine odor Assessment: 64 year old female with a history of multiple UTIs presenting today for concern due to abnormal odor of urine for the past 2 weeks.  She states that she has not had any dysuria but has  potentially had some suprapubic discomfort over this time.  No suprapubic discomfort on palpation.  Urinalysis performed today shows no leuk esterase, no nitrite.  She does have some moderate red blood cells present in the urine, which appear to have been present on previous checks. Plan: -We will not prescribe antibiotics today as we have no obvious signs of urinary infection. -We will work-up hematuria as above   Influenza vaccine provided  Lurline Del, [place]    This note was prepared using Dragon voice recognition software and may include unintentional dictation errors due to the inherent limitations of voice recognition software.

## 2020-08-12 NOTE — Patient Instructions (Signed)
It was great to see you!  Our plans for today:  -Today we discussed your concern for urinary tract infection.  We checked your urine which showed no signs of infection but did show some blood in your urine.  From the review of your chart it does not look like you ever had the cystoscopy, or scope or the look inside the bladder when you were previously seen by urology.  I would like to send another referral for them to do this.  They should contact you in the next 1-2 weeks.  If you do not hear from them in the next 2 weeks please let me know.. -We also provided your flu shot today.  Take care and seek immediate care sooner if you develop any concerns.   Dr. [facility]

## 2020-08-12 NOTE — Assessment & Plan Note (Signed)
Assessment: 64 year old female with a history of multiple UTIs presenting today for concern due to abnormal odor of urine for the past 2 weeks.  She states that she has not had any dysuria but has potentially had some suprapubic discomfort over this time.  No suprapubic discomfort on palpation.  Urinalysis performed today shows no leuk esterase, no nitrite.  She does have some moderate red blood cells present in the urine, which appear to have been present on previous checks. Plan: -We will not prescribe antibiotics today as we have no obvious signs of urinary infection. -We will work-up hematuria as above

## 2020-09-11 ENCOUNTER — Telehealth: Payer: Self-pay | Admitting: Family Medicine

## 2020-09-11 NOTE — Telephone Encounter (Signed)
Called patient to follow-up on her previous referral to urology.  At her previous appointment we had discussed the fact that patient had hematuria and had previously been evaluated by urology but it did not appear that she ever had a cystoscopy.  Patient does have a significant smoking history.  Patient states that she does not believe she ever received a phone call to schedule the appointment for urology.  Per the documentation on the referral it looks like the referral had gone through and the plan was for them to reach out and call her.  I provided the patient with [facility]'s phone number to call and schedule the appointment.  Patient stated that she would call back and let me know if she had any difficulty in getting an appointment to be evaluated by urology.

## 2020-09-23 ENCOUNTER — Ambulatory Visit (INDEPENDENT_AMBULATORY_CARE_PROVIDER_SITE_OTHER): Payer: No Typology Code available for payment source | Admitting: Family Medicine

## 2020-09-23 DIAGNOSIS — Z5329 Procedure and treatment not carried out because of patient's decision for other reasons: Secondary | ICD-10-CM

## 2020-09-23 NOTE — Progress Notes (Signed)
Late cancellation 09/23/2020, rescheduled for 09/24/20.  Milus Banister, [place], PGY-3 09/24/2020 8:45 AM

## 2020-09-24 ENCOUNTER — Ambulatory Visit: Payer: No Typology Code available for payment source | Admitting: Family Medicine

## 2020-09-24 DIAGNOSIS — Z91199 Patient's noncompliance with other medical treatment and regimen due to unspecified reason: Secondary | ICD-10-CM

## 2020-09-24 DIAGNOSIS — Z5329 Procedure and treatment not carried out because of patient's decision for other reasons: Secondary | ICD-10-CM

## 2020-09-24 HISTORY — DX: Procedure and treatment not carried out because of patient's decision for other reasons: Z53.29

## 2020-09-24 HISTORY — DX: Patient's noncompliance with other medical treatment and regimen due to unspecified reason: Z91.199

## 2020-09-24 NOTE — Progress Notes (Deleted)
    SUBJECTIVE:   CHIEF COMPLAINT / HPI:   Physical/Check up: ***  Health maintenance: due for Colonoscopy and Pap smear  PERTINENT  PMH / PSH: ***  OBJECTIVE:   There were no vitals taken for this visit.  ***  ASSESSMENT/PLAN:   No problem-specific Assessment & Plan notes found for this encounter.     [place]

## 2020-10-08 NOTE — Progress Notes (Signed)
    SUBJECTIVE:   CHIEF COMPLAINT / HPI:   Possible UTI /hematuria: Patient has a history of recurrent UTIs that have grown E. coli and Proteus in the past.  She reports feeling as if she has another UTI coming on with some pain in her low back and urinary frequency.  Denies any dysuria, urinary urgency, pelvic pain, vaginal discharge.  UA today is negative for UTI but is showing moderate hemoglobin.  No fevers, chills, body aches.  Desires smoking cessation counseling: Patient desires to quit smoking.  She reports she smokes half pack daily, has done this for about 20 years.  She is interested in Nicorette gum to help her quit smoke.  Check up: needs colonoscopy.  Patient had Cologuard performed in 2019, however this test never resulted.  Patient has difficulties with insurance and medical coverage for tests.  We will ask our laboratory director what her options are and how to go about ordering Cologuard or a fit test as needed.  Health maintenance: Patient due for colonoscopy  PERTINENT  PMH / PSH:  GAD, history of recurrent UTIs, tobacco use disorder   OBJECTIVE:   BP 122/60   Pulse 90   Wt 164 lb 6 oz (74.6 kg)   SpO2 99%   BMI 27.35 kg/m    Physical exam: General: Very pleasant patient, nontoxic. Respiratory: Work of breathing, speaking in complete sentences Cardio: RRR, S1-S2 present, no murmurs appreciated No CVA tenderness    ASSESSMENT/PLAN:   Encounter for smoking cessation counseling Patient given information regarding smoking cessation and steps to quit smoking, also prescribed red gum.  Was previously prescribed NicoDerm patches.  Was instructed that she can also use over-the-counter mints as needed.  Elevated glucose Patient with history of elevated A1c at 6.3%, indicating prediabetes.  Patient is not medicated.  A1c has not changed in 1 year. -Patient counseled on lifestyle modifications and management, can consider starting patient on metformin  Urinary  frequency Patient with urinary frequency, no other concerning history for stress or urge incontinence.  Reports having blood in urine, longstanding issue.  Has been unable to see urology due to difficulties with insurance. -Referral placed again for urology, believe it is urgent for this patient to be seen by urology for her longstanding history of hematuria  Hematuria Patient with urinary frequency, no other concerning history for stress or urge incontinence.  Reports having blood in urine, longstanding issue.  Has been unable to see urology due to difficulties with insurance.  Hematuria could be due to recurrent UTIs, however bladder cancer remains on the differential in this individual with smoking history. -Referral placed again for urology, believe it is urgent for this patient to be seen by urology for her longstanding history of hematuria  Colon cancer screening Patient with insurance difficulties making colonoscopy screening difficult, is open to Cologuard testing (done before in 2019, however orders were not properly placed and therefore sample never resulted). -Message sent to lab director regarding proper orders for Cologuard testing and how to have this performed for the patient -will follow-up     Kim Ross, [place]

## 2020-10-09 ENCOUNTER — Ambulatory Visit (INDEPENDENT_AMBULATORY_CARE_PROVIDER_SITE_OTHER): Payer: Self-pay | Admitting: Family Medicine

## 2020-10-09 ENCOUNTER — Other Ambulatory Visit: Payer: Self-pay

## 2020-10-09 ENCOUNTER — Encounter: Payer: Self-pay | Admitting: Family Medicine

## 2020-10-09 ENCOUNTER — Other Ambulatory Visit: Payer: Self-pay | Admitting: Family Medicine

## 2020-10-09 VITALS — BP 122/60 | HR 90 | Wt 164.4 lb

## 2020-10-09 DIAGNOSIS — R350 Frequency of micturition: Secondary | ICD-10-CM

## 2020-10-09 DIAGNOSIS — Z716 Tobacco abuse counseling: Secondary | ICD-10-CM

## 2020-10-09 DIAGNOSIS — R3121 Asymptomatic microscopic hematuria: Secondary | ICD-10-CM

## 2020-10-09 DIAGNOSIS — N3000 Acute cystitis without hematuria: Secondary | ICD-10-CM

## 2020-10-09 DIAGNOSIS — R7309 Other abnormal glucose: Secondary | ICD-10-CM

## 2020-10-09 DIAGNOSIS — Z1211 Encounter for screening for malignant neoplasm of colon: Secondary | ICD-10-CM

## 2020-10-09 LAB — POCT URINALYSIS DIP (MANUAL ENTRY)
Bilirubin, UA: NEGATIVE
Glucose, UA: NEGATIVE mg/dL
Ketones, POC UA: NEGATIVE mg/dL
Leukocytes, UA: NEGATIVE
Nitrite, UA: NEGATIVE
Protein Ur, POC: NEGATIVE mg/dL
Spec Grav, UA: >=1.03 — AB (ref 1.010–1.025)
Urobilinogen, UA: 0.2 E.U./dL
pH, UA: 6 (ref 5.0–8.0)

## 2020-10-09 LAB — POCT GLYCOSYLATED HEMOGLOBIN (HGB A1C): Hemoglobin A1C: 6.3 % — AB (ref 4.0–5.6)

## 2020-10-09 MED ORDER — NICOTINE POLACRILEX 2 MG MT GUM: 2.0000 mg | CHEWING_GUM | OROMUCOSAL | 0 refills | Status: DC | PRN

## 2020-10-09 NOTE — Patient Instructions (Addendum)
You can chew Nicorette gum to help you decrease smoking. You can also find mints over the counter to help reduce smoking.    Steps to Quit Smoking Smoking tobacco is the leading cause of preventable death. It can affect almost every organ in the body. Smoking puts you and people around you at risk for many serious, long-lasting (chronic) diseases. Quitting smoking can be hard, but it is one of the best things that you can do for your health. It is never too late to quit. How do I get ready to quit? When you decide to quit smoking, make a plan to help you succeed. Before you quit:  Pick a date to quit. Set a date within the next 2 weeks to give you time to prepare.  Write down the reasons why you are quitting. Keep this list in places where you will see it often.  Tell your family, friends, and co-workers that you are quitting. Their support is important.  Talk with your doctor about the choices that may help you quit.  Find out if your health insurance will pay for these treatments.  Know the people, places, things, and activities that make you want to smoke (triggers). Avoid them. What first steps can I take to quit smoking?  Throw away all cigarettes at home, at work, and in your car.  Throw away the things that you use when you smoke, such as ashtrays and lighters.  Clean your car. Make sure to empty the ashtray.  Clean your home, including curtains and carpets. What can I do to help me quit smoking? Talk with your doctor about taking medicines and seeing a counselor at the same time. You are more likely to succeed when you do both.  If you are pregnant or breastfeeding, talk with your doctor about counseling or other ways to quit smoking. Do not take medicine to help you quit smoking unless your doctor tells you to do so. To quit smoking: Quit right away  Quit smoking totally, instead of slowly cutting back on how much you smoke over a period of time.  Go to counseling. You  are more likely to quit if you go to counseling sessions regularly. Take medicine You may take medicines to help you quit. Some medicines need a prescription, and some you can buy over-the-counter. Some medicines may contain a drug called nicotine to replace the nicotine in cigarettes. Medicines may:  Help you to stop having the desire to smoke (cravings).  Help to stop the problems that come when you stop smoking (withdrawal symptoms). Your doctor may ask you to use:  Nicotine patches, gum, or lozenges.  Nicotine inhalers or sprays.  Non-nicotine medicine that is taken by mouth. Find resources Find resources and other ways to help you quit smoking and remain smoke-free after you quit. These resources are most helpful when you use them often. They include:  Online chats with a Social worker.  Phone quitlines.  Printed Furniture conservator/restorer.  Support groups or group counseling.  Text messaging programs.  Mobile phone apps. Use apps on your mobile phone or tablet that can help you stick to your quit plan. There are many free apps for mobile phones and tablets as well as websites. Examples include Quit Guide from the [employer] and smokefree.gov  What things can I do to make it easier to quit?   Talk to your family and friends. Ask them to support and encourage you.  Call a phone quitline (1-800-QUIT-NOW), reach out to support  groups, or work with a Social worker.  Ask people who smoke to not smoke around you.  Avoid places that make you want to smoke, such as: ? Bars. ? Parties. ? Smoke-break areas at work.  Spend time with people who do not smoke.  Lower the stress in your life. Stress can make you want to smoke. Try these things to help your stress: ? Getting regular exercise. ? Doing deep-breathing exercises. ? Doing yoga. ? Meditating. ? Doing a body scan. To do this, close your eyes, focus on one area of your body at a time from head to toe. Notice which parts of your body are  tense. Try to relax the muscles in those areas. How will I feel when I quit smoking? Day 1 to 3 weeks Within the first 24 hours, you may start to have some problems that come from quitting tobacco. These problems are very bad 2-3 days after you quit, but they do not often last for more than 2-3 weeks. You may get these symptoms:  Mood swings.  Feeling restless, nervous, angry, or annoyed.  Trouble concentrating.  Dizziness.  Strong desire for high-sugar foods and nicotine.  Weight gain.  Trouble pooping (constipation).  Feeling like you may vomit (nausea).  Coughing or a sore throat.  Changes in how the medicines that you take for other issues work in your body.  Depression.  Trouble sleeping (insomnia). Week 3 and afterward After the first 2-3 weeks of quitting, you may start to notice more positive results, such as:  Better sense of smell and taste.  Less coughing and sore throat.  Slower heart rate.  Lower blood pressure.  Clearer skin.  Better breathing.  Fewer sick days. Quitting smoking can be hard. Do not give up if you fail the first time. Some people need to try a few times before they succeed. Do your best to stick to your quit plan, and talk with your doctor if you have any questions or concerns. Summary  Smoking tobacco is the leading cause of preventable death. Quitting smoking can be hard, but it is one of the best things that you can do for your health.  When you decide to quit smoking, make a plan to help you succeed.  Quit smoking right away, not slowly over a period of time.  When you start quitting, seek help from your doctor, family, or friends. This information is not intended to replace advice given to you by your health care provider. Make sure you discuss any questions you have with your health care provider. Document Revised: 06/15/2019 Document Reviewed: 12/09/2018 Elsevier Patient Education  [place].   Please read below  about bladder irritants No research consistently links certain foods or drinks to IC. However, some research strongly suggests a relationship between diet and symptoms. Healthy eating and staying hydrated are important for your overall health, including bladder health.  No research links certain foods or drinks to interstitial cystitis, although healthy eating is important for your overall health, including bladder health.  However, some people with IC find that certain foods or drinks trigger or worsen their symptoms. Coffee, soda, alcohol, tomatoes, hot and spicy foods, chocolate, caffeinated beverages, citrus juices and drinks, MSG, and high-acid foods can trigger IC symptoms or make them worse. Some people also note that their symptoms get worse after eating or drinking products with artificial sweeteners, or sweeteners that are not found naturally in foods and beverages.  Learning which foods trigger your symptoms or  make them worse may take some effort. Keep a food diary and note the times you have bladder pain. For example, the diary might show that your symptom flares always happen after you eat tomatoes or oranges. If you find that certain foods make your symptoms worse, your health care professional and dietitian can help you avoid them with an eating plan. Find an expert External link to advise you on how to use nutrition and ingredient information on a food label. You can use this information to help you avoid eating or drinking things that trigger pain in your bladder.  Stopping certain foods and drinks--and then adding them back to what you normally eat and drink one at a time--may help you figure out which foods or drinks, if any, affect your symptoms. Talk with your health care professional about how much liquid you should drink to prevent dehydration based on your health, how active you are, and where you live. Water is the best liquid for bladder health.

## 2020-10-10 ENCOUNTER — Encounter: Payer: Self-pay | Admitting: Family Medicine

## 2020-10-13 DIAGNOSIS — R7309 Other abnormal glucose: Secondary | ICD-10-CM | POA: Insufficient documentation

## 2020-10-13 DIAGNOSIS — Z1211 Encounter for screening for malignant neoplasm of colon: Secondary | ICD-10-CM | POA: Insufficient documentation

## 2020-10-13 NOTE — Assessment & Plan Note (Signed)
Patient with urinary frequency, no other concerning history for stress or urge incontinence.  Reports having blood in urine, longstanding issue.  Has been unable to see urology due to difficulties with insurance. -Referral placed again for urology, believe it is urgent for this patient to be seen by urology for her longstanding history of hematuria

## 2020-10-13 NOTE — Assessment & Plan Note (Signed)
Patient with insurance difficulties making colonoscopy screening difficult, is open to Cologuard testing (done before in 2019, however orders were not properly placed and therefore sample never resulted). -Message sent to lab director regarding proper orders for Cologuard testing and how to have this performed for the patient -will follow-up

## 2020-10-13 NOTE — Assessment & Plan Note (Addendum)
Patient with urinary frequency, no other concerning history for stress or urge incontinence.  Reports having blood in urine, longstanding issue.  Has been unable to see urology due to difficulties with insurance.  Hematuria could be due to recurrent UTIs, however bladder cancer remains on the differential in this individual with smoking history. -Referral placed again for urology, believe it is urgent for this patient to be seen by urology for her longstanding history of hematuria

## 2020-10-13 NOTE — Assessment & Plan Note (Signed)
Patient given information regarding smoking cessation and steps to quit smoking, also prescribed red gum.  Was previously prescribed NicoDerm patches.  Was instructed that she can also use over-the-counter mints as needed.

## 2020-10-13 NOTE — Assessment & Plan Note (Signed)
Patient with history of elevated A1c at 6.3%, indicating prediabetes.  Patient is not medicated.  A1c has not changed in 1 year. -Patient counseled on lifestyle modifications and management, can consider starting patient on metformin

## 2020-10-15 NOTE — Addendum Note (Signed)
Addended by: Daisy Floro on: 10/15/2020 07:30 AM   Modules accepted: Orders

## 2020-10-15 NOTE — Progress Notes (Signed)
Order placed for cologuard testing. Lab director notified and will reach out to patient with instructions and next steps.   Milus Banister, [place], PGY-3 10/15/2020 7:30 AM

## 2020-11-09 NOTE — Progress Notes (Signed)
    SUBJECTIVE:   CHIEF COMPLAINT / HPI:   Dry skin: Patient presents to clinic reporting dry skin to her hands and backs of elbows.  She reports this issue has slowly been occurring over the last several days.  She recently purchased an inexpensive soap at a local store and believes this soap has something to do with creating the dryness in her hands.  The patient has since stopped using the soap.  She denies any itching, bleeding, or bumps associated with the dry skin.  There are no concerns for dry mouth or reduced tear production.  Her range of motion with her hands and elbows is full and symmetrical bilaterally.  The patient has no history of psoriasis, eczema or Sjogren's syndrome.  PERTINENT  PMH / PSH:  Patient Active Problem List   Diagnosis Date Noted  . Dry skin 11/17/2020  . Elevated glucose 10/13/2020  . Colon cancer screening 10/13/2020  . Hyperglycemia 09/05/2019  . Onychomycosis of left great toe 09/05/2019  . UTI symptoms 10/26/2018  . Seasonal allergies 12/30/2017  . Hyperlipidemia 12/30/2017  . Encounter for smoking cessation counseling 12/30/2017  . UTI (urinary tract infection) 02/09/2017  . Hematuria 04/01/2015  . History of recurrent UTIs 01/31/2014  . Vertigo, intermittent 06/27/2013  . Generalized anxiety disorder 04/10/2013  . Urinary frequency 04/10/2013  . Irritable bowel syndrome 10/19/2012  . Tobacco use disorder 07/20/2012   OBJECTIVE:   BP 102/60   Pulse 88   Wt 162 lb (73.5 kg)   SpO2 99%   BMI 26.96 kg/m    Physical exam: General: Well-appearing, no apparent distress Respiratory: Comfortable work of breathing, speaking complete sentences Integumentary: Dry, cracked skin appreciated to bilateral hands of dorsal and ventral aspects as well as to skin overlying olecranon process of bilateral elbows; no papules/scale appreciated on physical exam, no bleeding or signs of infection appreciated.  ASSESSMENT/PLAN:   Dry skin Patient with dry  skin appreciated to bilateral hands and extensor surface of elbows.  Skin does not appear tight and there are no concerns for dry mouth/mucous membranes to suggest Sjogren's syndrome.  There are no papules or pruritus to suggest eczema.  No scale/bleeding to suggest Psoriasis. -Feel skin changes are most likely due pure dry skin after using inexpensive soap -Recommended patient use Aquaphor on hands/elbows and to keep hands covered with gloves at night to keep moisture in -Turn humidifier on in house to improve humidity -Avoid excessive hand washing and don't wash with scalding hot water - wear gloves for doing dishes, etc.      Kim Ross, [place]

## 2020-11-11 ENCOUNTER — Ambulatory Visit (INDEPENDENT_AMBULATORY_CARE_PROVIDER_SITE_OTHER): Payer: Self-pay | Admitting: Family Medicine

## 2020-11-11 ENCOUNTER — Encounter: Payer: Self-pay | Admitting: Family Medicine

## 2020-11-11 ENCOUNTER — Other Ambulatory Visit: Payer: Self-pay

## 2020-11-11 VITALS — BP 102/60 | HR 88 | Wt 162.0 lb

## 2020-11-11 DIAGNOSIS — L853 Xerosis cutis: Secondary | ICD-10-CM

## 2020-11-11 NOTE — Patient Instructions (Addendum)
Thank you for coming in to see [place] today! Please see below to review our plan for today's visit:  1. Use Aquaphor on your hands and keep your hands covered with gloves.  2. Turn your humidifier on and keep it going.  3. Avoid excessive hand washing and don't wash with scalding hot water - wear gloves for doing dishes, etc.   Please call the clinic at [phone] if your symptoms worsen or you have any concerns. It was our pleasure to serve you!   Dr. Milus Banister [facility]

## 2020-11-17 ENCOUNTER — Encounter: Payer: Self-pay | Admitting: Family Medicine

## 2020-11-17 DIAGNOSIS — L853 Xerosis cutis: Secondary | ICD-10-CM | POA: Insufficient documentation

## 2020-11-17 NOTE — Assessment & Plan Note (Signed)
Patient with dry skin appreciated to bilateral hands and extensor surface of elbows.  Skin does not appear tight and there are no concerns for dry mouth/mucous membranes to suggest Sjogren's syndrome.  There are no papules or pruritus to suggest eczema.  No scale/bleeding to suggest Psoriasis. -Feel skin changes are most likely due pure dry skin after using inexpensive soap -Recommended patient use Aquaphor on hands/elbows and to keep hands covered with gloves at night to keep moisture in -Turn humidifier on in house to improve humidity -Avoid excessive hand washing and don't wash with scalding hot water - wear gloves for doing dishes, etc.

## 2020-11-21 ENCOUNTER — Telehealth: Payer: Self-pay

## 2020-11-21 NOTE — Telephone Encounter (Signed)
Patient calls nurse line regarding continued issues with dry skin. Patient reports that she has been using the Aquaphor as recommended by provider, however, skin has not improved.   Patient is asking if there is any additional medication she could take to help with her skin.   Please advise.   Talbot Grumbling, RN

## 2020-11-24 NOTE — Telephone Encounter (Signed)
Patient Kim Ross on nurse line requesting any additional medication that might be beneficial to her dry skin. Patient reports she has been using Aquaphor with minimal relief. Please advise on medication or possible derm referral.

## 2020-11-25 NOTE — Telephone Encounter (Signed)
Patient comes into clinic to checks status of previous messages.   Please advise.   Talbot Grumbling, RN

## 2020-11-26 NOTE — Telephone Encounter (Signed)
Patient scheduled for 02/25 with PCP.

## 2020-11-28 ENCOUNTER — Other Ambulatory Visit: Payer: Self-pay

## 2020-11-28 ENCOUNTER — Other Ambulatory Visit: Payer: Self-pay | Admitting: Family Medicine

## 2020-11-28 ENCOUNTER — Ambulatory Visit (INDEPENDENT_AMBULATORY_CARE_PROVIDER_SITE_OTHER): Payer: Self-pay | Admitting: Family Medicine

## 2020-11-28 ENCOUNTER — Encounter: Payer: Self-pay | Admitting: Family Medicine

## 2020-11-28 VITALS — BP 139/72 | HR 95 | Ht 65.0 in | Wt 161.8 lb

## 2020-11-28 DIAGNOSIS — M25512 Pain in left shoulder: Secondary | ICD-10-CM

## 2020-11-28 DIAGNOSIS — L853 Xerosis cutis: Secondary | ICD-10-CM

## 2020-11-28 DIAGNOSIS — B351 Tinea unguium: Secondary | ICD-10-CM

## 2020-11-28 MED ORDER — TRIAMCINOLONE ACETONIDE 0.5 % EX OINT: 1.0000 "application " | TOPICAL_OINTMENT | Freq: Two times a day (BID) | CUTANEOUS | 3 refills | Status: DC

## 2020-11-28 MED ORDER — TERBINAFINE HCL 250 MG PO TABS: 250.0000 mg | ORAL_TABLET | Freq: Every day | ORAL | 3 refills | Status: DC

## 2020-11-28 NOTE — Patient Instructions (Addendum)
Thank you for coming in to see [place] today! Please see below to review our plan for today's visit:  1. Left shoulder: try your hardest not to sleep on it, apply ice to as you need to, you can take Tylenol 1 tablet of each together 3 times day to decrease shoulder pain.  2. Skin: apply triamcinolone topical to your elbows, ankles and hands 2-3 times daily. Keep moisturized in between.  3. Nail fungus: take Terbinafine 250mg  (1 tablet) daily. You will do this treatment for 120 days. Follow up with [place] for repeat labs in 4 weeks.   Please call the clinic at [phone] if your symptoms worsen or you have any concerns. It was our pleasure to serve you!   Dr. Milus Banister [facility]

## 2020-11-28 NOTE — Progress Notes (Signed)
SUBJECTIVE:   CHIEF COMPLAINT / HPI:   Follow up Dry skin: Patient seen 11/11/2020 with dry skin to her hands and backs of elbows.  She reports this issue has slowly been occurring over the last several days.  She recently purchased an inexpensive soap at a local store and believes this soap has something to do with creating the dryness in her hands.  The patient has since stopped using the soap.  She denies any itching, bleeding, or bumps associated with the dry skin.  There are no concerns for dry mouth or reduced tear production.  Her range of motion with her hands and elbows is full and symmetrical bilaterally.  The patient has no history of psoriasis, eczema or Sjogren's syndrome.  Dry skin is no better today, using topical meds 2-3 times daily, sleeping gloves, ankles are worse and also itchy now. Patient works as an Data processing manager.    Left shoulder ache: aches worse at night, going on about 1 week, no falls/trauma, no previous injuries. She woke up with the pain 1 day last week. Pain worse at night and in the morning.   Fingernail Fungus: patient noted to have fingernail fungus on left middle digit. Patient also has a history of onychomycosis of feet. She is asking if we can treat this for her starting today. Last CMP from several months ago is grossly normal. Will repeat today.   PERTINENT  PMH / PSH:  Patient Active Problem List   Diagnosis Date Noted  . Acute pain of left shoulder 12/01/2020  . Dry skin 11/17/2020  . Elevated glucose 10/13/2020  . Colon cancer screening 10/13/2020  . Hyperglycemia 09/05/2019  . Onychomycosis of left great toe 09/05/2019  . UTI symptoms 10/26/2018  . Seasonal allergies 12/30/2017  . Hyperlipidemia 12/30/2017  . Encounter for smoking cessation counseling 12/30/2017  . UTI (urinary tract infection) 02/09/2017  . Hematuria 04/01/2015  . History of recurrent UTIs 01/31/2014  . Vertigo, intermittent 06/27/2013  . Generalized anxiety  disorder 04/10/2013  . Urinary frequency 04/10/2013  . Irritable bowel syndrome 10/19/2012  . Tobacco use disorder 07/20/2012    OBJECTIVE:   BP 139/72   Pulse 95   Ht 5\' 5"  (1.651 m)   Wt 161 lb 12.8 oz (73.4 kg)   SpO2 98%   BMI 26.92 kg/m    Physical exam: General: well appearing, nontoxic  Respiratory: comfortable work of breathing on RA Integumentary: see below (dry patches without bleeding appreciated to elbows and ankle; hands with dryness, onychomycosis appreciated to fingernails)   Right elbow      Left elbow    Hands         Right ankle  ASSESSMENT/PLAN:   Acute pain of left shoulder No deformity or focal tenderness appreciated. Good strength to upper extremities bilaterally. Full ROM in bilateral upper extremities make adhesive capsulitis less likely. No specific finding concerning for rotator cuff tear/injury.  -Patient instructed not to sleep on shoulder -Apply ice to shoulder 2-3 times daily as needed -Take 1 tablet each of Tylenol and Ibuprofen up to 3 times daily to decrease shoulder pain.   Onychomycosis of left great toe Patient with nail fungus on fingernails. CMP checked today stable.  -Terbinafine 250mg  daily x120 days -Patient to follow up in 4 weeks for repeat CMP  Dry skin Worsening since last visit. 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  -  Keep moisturized in between -Follow up 4 weeks     Kim Ross

## 2020-11-29 LAB — COMPREHENSIVE METABOLIC PANEL
ALT: 12 IU/L (ref 0–32)
AST: 16 IU/L (ref 0–40)
Albumin/Globulin Ratio: 1.1 — ABNORMAL LOW (ref 1.2–2.2)
Albumin: 3.6 g/dL — ABNORMAL LOW (ref 3.8–4.8)
Alkaline Phosphatase: 180 IU/L — ABNORMAL HIGH (ref 44–121)
BUN/Creatinine Ratio: 10 — ABNORMAL LOW (ref 12–28)
BUN: 9 mg/dL (ref 8–27)
Bilirubin Total: <0.2 mg/dL (ref 0.0–1.2)
CO2: 19 mmol/L — ABNORMAL LOW (ref 20–29)
Calcium: 9.4 mg/dL (ref 8.7–10.3)
Chloride: 105 mmol/L (ref 96–106)
Creatinine, Ser: 0.93 mg/dL (ref 0.57–1.00)
GFR calc Af Amer: 76 mL/min/{1.73_m2} (ref >59)
GFR calc non Af Amer: 66 mL/min/{1.73_m2} (ref >59)
Globulin, Total: 3.4 g/dL (ref 1.5–4.5)
Glucose: 116 mg/dL — ABNORMAL HIGH (ref 65–99)
Potassium: 4.5 mmol/L (ref 3.5–5.2)
Sodium: 143 mmol/L (ref 134–144)
Total Protein: 7 g/dL (ref 6.0–8.5)

## 2020-12-01 DIAGNOSIS — M25512 Pain in left shoulder: Secondary | ICD-10-CM | POA: Insufficient documentation

## 2020-12-01 HISTORY — DX: Pain in left shoulder: M25.512

## 2020-12-01 NOTE — Assessment & Plan Note (Signed)
No deformity or focal tenderness appreciated. Good strength to upper extremities bilaterally. Full ROM in bilateral upper extremities make adhesive capsulitis less likely. No specific finding concerning for rotator cuff tear/injury.  -Patient instructed not to sleep on shoulder -Apply ice to shoulder 2-3 times daily as needed -Take 1 tablet each of Tylenol and Ibuprofen up to 3 times daily to decrease shoulder pain.

## 2020-12-02 NOTE — Assessment & Plan Note (Signed)
Worsening since last visit. 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  -Keep moisturized in between -Follow up 4 weeks

## 2020-12-02 NOTE — Assessment & Plan Note (Signed)
Patient with nail fungus on fingernails. CMP checked today stable.  -Terbinafine 250mg  daily x120 days -Patient to follow up in 4 weeks for repeat CMP

## 2020-12-18 ENCOUNTER — Encounter (HOSPITAL_COMMUNITY): Payer: Self-pay | Admitting: *Deleted

## 2020-12-18 ENCOUNTER — Emergency Department (HOSPITAL_COMMUNITY)
Admission: EM | Admit: 2020-12-18 | Discharge: 2020-12-18 | Disposition: A | Discharge status: Home / Self Care | Payer: No Typology Code available for payment source | Attending: Emergency Medicine | Admitting: Emergency Medicine

## 2020-12-18 DIAGNOSIS — L853 Xerosis cutis: Secondary | ICD-10-CM | POA: Insufficient documentation

## 2020-12-18 DIAGNOSIS — F17210 Nicotine dependence, cigarettes, uncomplicated: Secondary | ICD-10-CM | POA: Insufficient documentation

## 2020-12-18 MED ORDER — TRIAMCINOLONE ACETONIDE 0.1 % EX CREA: 1.0000 "application " | TOPICAL_CREAM | Freq: Two times a day (BID) | CUTANEOUS | 0 refills | Status: DC

## 2020-12-18 NOTE — Discharge Instructions (Addendum)
I have prescribed you more steroid cream. Use twice daily on affected areas. Continue to use Vaseline frequently. Use lotion twice daily as well.  I have included the number of a dermatologist.  Please call to schedule appointment for further evaluation.  Report to your PCP on Monday for your follow-up appointment.  Return to the ER for new or worsening symptoms.

## 2020-12-18 NOTE — ED Provider Notes (Signed)
[facility] DEPT Provider Note   CSN: [id number] Arrival date & time: 12/18/20  1331     History Chief Complaint  Patient presents with  . Skin Problem    Kim Ross is a 65 y.o. female with a past medical history significant for anxiety, cocaine abuse, depression, history of goiter who presents to the ED due to persistent "dark patches" on her hands, elbows, and knees x2 to 3 weeks.  Chart reviewed.  Patient has been evaluated by her PCP twice for same complaint and prescribed triamcinolone cream which patient has been compliant with; however, recently ran out.  Patient notes no improvement in dark patches.  Patient denies fever and chills.  Denies erythema, edema, and warmth. Denies joint pain or decreased ROM of joints.  She has also been using Vaseline with no relief.  Patient has a follow-up appointment on Monday.   History obtained from patient and past medical records. No interpreter used during encounter.      Past Medical History:  Diagnosis Date  . Anxiety   . Aspiration pneumonitis ([place]) 08/05/2019  . Breast cancer screening 12/30/2017  . Chest wall pain 11/05/2019  . Cocaine abuse ([place])   . Depression   . Drug intoxication ([place]) 08/05/2019  . Encounter for screening for HIV 08/05/2019  . Goiter 09/05/2019  . History of recurrent UTIs   . No-show for appointment 09/24/2020    Patient Active Problem List   Diagnosis Date Noted  . Acute pain of left shoulder 12/01/2020  . Dry skin 11/17/2020  . Elevated glucose 10/13/2020  . Colon cancer screening 10/13/2020  . Hyperglycemia 09/05/2019  . Onychomycosis of left great toe 09/05/2019  . UTI symptoms 10/26/2018  . Seasonal allergies 12/30/2017  . Hyperlipidemia 12/30/2017  . Encounter for smoking cessation counseling 12/30/2017  . UTI (urinary tract infection) 02/09/2017  . Hematuria 04/01/2015  . History of recurrent UTIs 01/31/2014  . Vertigo, intermittent 06/27/2013  . Generalized  anxiety disorder 04/10/2013  . Urinary frequency 04/10/2013  . Irritable bowel syndrome 10/19/2012  . Tobacco use disorder 07/20/2012    Past Surgical History:  Procedure Laterality Date  . ABDOMINAL HYSTERECTOMY    . HEMORRHOID SURGERY       OB History    Gravida  1   Para      Term      Preterm      AB      Living  1     SAB      IAB      Ectopic      Multiple      Live Births  1           Family History  Problem Relation Age of Onset  . Asthma Mother   . Cancer Sister 59       Pancreatic    Social History   Tobacco Use  . Smoking status: Current Every Day Smoker    Packs/day: 0.50    Types: Cigarettes  . Smokeless tobacco: Never Used  Vaping Use  . Vaping Use: Never used  Substance Use Topics  . Alcohol use: Yes    Alcohol/week: 1.0 standard drink    Types: 1 Cans of beer per week  . Drug use: No    Home Medications Prior to Admission medications   Medication Sig Start Date End Date Taking? Authorizing Provider  triamcinolone (KENALOG) 0.1 % Apply 1 application topically 2 (two) times daily. 12/18/20  Yes Charmaine Downs  C, PA-C  atorvastatin (LIPITOR) 40 MG tablet Take 1 tablet (40 mg total) by mouth daily. 01/18/20   Daisy Floro, DO  Elastic Bandages & Supports (ABDOMINAL BINDER/ELASTIC LARGE) MISC 1 Package by Does not apply route daily. 11/05/19   Anderson, Chelsey L, DO  lidocaine (LIDODERM) 5 % Place 1 patch onto the skin daily. Remove & Discard patch within 12 hours or as directed by MD 11/05/19   Doristine Mango L, DO  meclizine (ANTIVERT) 25 MG tablet TAKE 1 TABLET BY MOUTH TWICE A DAY 10/09/20   Daisy Floro, DO  Multiple Vitamins-Minerals (MULTIVITAMIN WITH MINERALS) tablet Take 1 tablet by mouth daily. 04/27/13   Olam Idler, MD  nicotine (NICODERM CQ - DOSED IN MG/24 HR) 7 mg/24hr patch Place 1 patch (7 mg total) onto the skin daily. 09/04/19   Daisy Floro, DO  nicotine polacrilex (NICORETTE) 2 MG gum Take 1  each (2 mg total) by mouth as needed for smoking cessation. 10/09/20   Daisy Floro, DO  predniSONE (DELTASONE) 20 MG tablet Take 2 tablets (40 mg total) by mouth daily with breakfast. For the next four days 10/28/19   Carmin Muskrat, MD  sertraline (ZOLOFT) 50 MG tablet TAKE 1 TABLET BY MOUTH EVERY DAY 11/30/20   Daisy Floro, DO  terbinafine (LAMISIL) 250 MG tablet Take 1 tablet (250 mg total) by mouth daily. 11/28/20   Daisy Floro, DO  triamcinolone ointment (KENALOG) 0.5 % Apply 1 application topically 2 (two) times daily. For moderate to severe eczema.  Do not use for more than 1 week at a time. 11/28/20   Daisy Floro, DO    Allergies    Ibuprofen and Trazodone and nefazodone  Review of Systems   Review of Systems  Constitutional: Negative for chills and fever.  Musculoskeletal: Negative for arthralgias and joint swelling.  Skin: Positive for color change.  Neurological: Negative for numbness.    Physical Exam Updated Vital Signs BP (!) 145/77 (BP Location: Left Arm)   Pulse 94   Temp 97.7 F (36.5 C) (Oral)   Resp 18   SpO2 99%   Physical Exam Vitals and nursing note reviewed.  Constitutional:      General: She is not in acute distress. HENT:     Head: Normocephalic.  Eyes:     Pupils: Pupils are equal, round, and reactive to light.  Cardiovascular:     Rate and Rhythm: Normal rate and regular rhythm.     Pulses: Normal pulses.     Heart sounds: Normal heart sounds. No murmur heard. No friction rub. No gallop.   Pulmonary:     Effort: Pulmonary effort is normal.     Breath sounds: Normal breath sounds.  Abdominal:     General: Abdomen is flat. There is no distension.     Palpations: Abdomen is soft.     Tenderness: There is no abdominal tenderness. There is no guarding or rebound.  Musculoskeletal:     Cervical back: Neck supple.     Comments: Full ROM of all joints. No edematous, erythematous, or warm joints.  Skin:    Comments: Dark  patches of dry skin to bilateral elbows, knees, and fingers. No erythema, warmth, or edema. Dry skin present on lower extremities.  Neurological:     General: No focal deficit present.  Psychiatric:        Mood and Affect: Mood normal.        Behavior: Behavior normal.  ED Results / Procedures / Treatments   Labs (all labs ordered are listed, but only abnormal results are displayed) Labs Reviewed - No data to display  EKG None  Radiology No results found.  Procedures Procedures   Medications Ordered in ED Medications - No data to display  ED Course  I have reviewed the triage vital signs and the nursing notes.  Pertinent labs & imaging results that were available during my care of the patient were reviewed by me and considered in my medical decision making (see chart for details).    MDM Rules/Calculators/A&P                         65 year old female presents to the ED due to hyperpigmentation to bilateral elbows, knees, and fingers x2 to 3 weeks.  Patient evaluated by PCP twice and discharged with triamcinolone cream and advised to use over-the-counter lotion. No fever or chills. No joint pain.  Upon arrival, patient is afebrile, not tachycardic or hypoxic.  Hyperpigmented plaques to extensor surfaces consistent with possible psoriasis.  No signs of infection.  Full range of motion of all joints.  Low suspicion for septic arthritis.  Patient ran out of her triamcinolone cream.  Prescription refilled.  Advised patient to report to her PCP follow-up appointment on Monday.  Patient discharged with dermatology referral.  Advised patient to call tomorrow to schedule appoint for further evaluation. Strict ED precautions discussed with patient. Patient states understanding and agrees to plan. Patient discharged home in no acute distress and stable vitals.  Final Clinical Impression(s) / ED Diagnoses Final diagnoses:  Dry skin    Rx / DC Orders ED Discharge Orders          Ordered    triamcinolone (KENALOG) 0.1 %  2 times daily        12/18/20 1503           [name] 12/18/20 1515    Valarie Merino, MD 12/20/20 2154

## 2020-12-18 NOTE — ED Triage Notes (Signed)
Pt complains of dark patches of skin on her hands, elbows and knees 2-3 weeks.

## 2021-01-07 ENCOUNTER — Ambulatory Visit (INDEPENDENT_AMBULATORY_CARE_PROVIDER_SITE_OTHER): Payer: Self-pay | Admitting: Family Medicine

## 2021-01-07 ENCOUNTER — Other Ambulatory Visit: Payer: Self-pay

## 2021-01-07 VITALS — BP 120/62 | HR 96 | Ht 65.0 in | Wt 159.2 lb

## 2021-01-07 DIAGNOSIS — R8290 Unspecified abnormal findings in urine: Secondary | ICD-10-CM

## 2021-01-07 DIAGNOSIS — Z1211 Encounter for screening for malignant neoplasm of colon: Secondary | ICD-10-CM

## 2021-01-07 DIAGNOSIS — B351 Tinea unguium: Secondary | ICD-10-CM

## 2021-01-07 DIAGNOSIS — L853 Xerosis cutis: Secondary | ICD-10-CM

## 2021-01-07 LAB — POCT URINALYSIS DIP (MANUAL ENTRY)
Bilirubin, UA: NEGATIVE
Glucose, UA: NEGATIVE mg/dL
Ketones, POC UA: NEGATIVE mg/dL
Nitrite, UA: NEGATIVE
Protein Ur, POC: NEGATIVE mg/dL
Spec Grav, UA: 1.025 (ref 1.010–1.025)
Urobilinogen, UA: 0.2 E.U./dL
pH, UA: 5.5 (ref 5.0–8.0)

## 2021-01-07 MED ORDER — TERBINAFINE HCL 250 MG PO TABS: 250.0000 mg | ORAL_TABLET | Freq: Every day | ORAL | 3 refills | Status: DC

## 2021-01-07 NOTE — Progress Notes (Signed)
    SUBJECTIVE:   CHIEF COMPLAINT / HPI:   Onychomycosis: patient following up for repeat CMP today as she is being treated for fungus to many finger nails. She reports her nails are doing much better but still have fungus. Her repeat CMP is stable.   Dry skin: patient had dry skin of hands, elbows and lower extremities. She reports it is now improved. Patient has been using her Triamcinolone 0.5% twice daily and keeping moisturized in between. Her hands are continuing to improve but her elbows and lower extremity dry skin has resolved.   Malodorous urine: patient reports that her urine developed a bad smell the last couple days. She denies urinary urgency or frequency. Denies back pain, fevers, chills, body aches.   PERTINENT  PMH / PSH:  Patient Active Problem List   Diagnosis Date Noted  . Abnormal urine odor 01/11/2021  . Acute pain of left shoulder 12/01/2020  . Dry skin 11/17/2020  . Elevated glucose 10/13/2020  . Colon cancer screening 10/13/2020  . Hyperglycemia 09/05/2019  . Onychomycosis of left great toe 09/05/2019  . Seasonal allergies 12/30/2017  . Hyperlipidemia 12/30/2017  . Encounter for smoking cessation counseling 12/30/2017  . UTI (urinary tract infection) 02/09/2017  . Hematuria 04/01/2015  . History of recurrent UTIs 01/31/2014  . Vertigo, intermittent 06/27/2013  . Generalized anxiety disorder 04/10/2013  . Irritable bowel syndrome 10/19/2012  . Tobacco use disorder 07/20/2012     OBJECTIVE:   BP 120/62   Pulse 96   Ht 5\' 5"  (1.651 m)   Wt 72.2 kg   SpO2 96%   BMI 26.50 kg/m    Physical exam: General: well appearing, no acute distress Respiratory: comfortable work of breathing on room air  ASSESSMENT/PLAN:   Onychomycosis of left great toe CMP on Terbinafine stable. -Continue Terbinafine 250mg  daily x 90 more days -Follow up 2-3 months  Abnormal urine odor Patient with malodorous urine. No urinary frequency or urgency, no CVA tenderness,  fevers, chills or body aches to be concerned for UTI or other systemic symptoms. UA today unremarkable. -No treatment as no evidence or symptoms for UTI   Dry skin Improved since last visit. 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 Recommend 2 weeks break between steroid topical cycles. -Keep moisturized in between -Follow up as needed     Daisy Floro, [place]

## 2021-01-07 NOTE — Patient Instructions (Signed)
Thank you for coming in to see Kim Ross today! Please see below to review our plan for today's visit:  1. We are checking your liver function. Keep taking the Terbinafine.  2. Keep doing the regimen with your skin.   Please call the clinic at [phone] if your symptoms worsen or you have any concerns. It was our pleasure to serve you!   Dr. Milus Banister [facility]

## 2021-01-08 LAB — COMPREHENSIVE METABOLIC PANEL
ALT: 12 IU/L (ref 0–32)
AST: 15 IU/L (ref 0–40)
Albumin/Globulin Ratio: 1.4 (ref 1.2–2.2)
Albumin: 4 g/dL (ref 3.8–4.8)
Alkaline Phosphatase: 176 IU/L — ABNORMAL HIGH (ref 44–121)
BUN/Creatinine Ratio: 20 (ref 12–28)
BUN: 17 mg/dL (ref 8–27)
Bilirubin Total: 0.3 mg/dL (ref 0.0–1.2)
CO2: 20 mmol/L (ref 20–29)
Calcium: 9.3 mg/dL (ref 8.7–10.3)
Chloride: 106 mmol/L (ref 96–106)
Creatinine, Ser: 0.87 mg/dL (ref 0.57–1.00)
Globulin, Total: 2.9 g/dL (ref 1.5–4.5)
Glucose: 91 mg/dL (ref 65–99)
Potassium: 4.7 mmol/L (ref 3.5–5.2)
Sodium: 142 mmol/L (ref 134–144)
Total Protein: 6.9 g/dL (ref 6.0–8.5)
eGFR: 75 mL/min/{1.73_m2} (ref >59)

## 2021-01-11 ENCOUNTER — Encounter: Payer: Self-pay | Admitting: Family Medicine

## 2021-01-11 DIAGNOSIS — R8290 Unspecified abnormal findings in urine: Secondary | ICD-10-CM | POA: Insufficient documentation

## 2021-01-11 NOTE — Assessment & Plan Note (Signed)
CMP on Terbinafine stable. -Continue Terbinafine 250mg  daily x 90 more days -Follow up 2-3 months

## 2021-01-11 NOTE — Assessment & Plan Note (Signed)
Improved since last visit. 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 Recommend 2 weeks break between steroid topical cycles. -Keep moisturized in between -Follow up as needed

## 2021-01-11 NOTE — Assessment & Plan Note (Signed)
Patient with malodorous urine. No urinary frequency or urgency, no CVA tenderness, fevers, chills or body aches to be concerned for UTI or other systemic symptoms. UA today unremarkable. -No treatment as no evidence or symptoms for UTI

## 2021-01-14 ENCOUNTER — Telehealth: Payer: Self-pay

## 2021-01-14 ENCOUNTER — Encounter: Payer: Self-pay | Admitting: Family Medicine

## 2021-01-14 NOTE — Addendum Note (Signed)
Addended by: Daisy Floro on: 01/14/2021 07:50 PM   Modules accepted: Orders

## 2021-01-14 NOTE — Telephone Encounter (Signed)
Patient calls nurse line requesting lab results from 4/6. Please do not send a mychart message per patient request. Please advise.

## 2021-01-15 ENCOUNTER — Telehealth: Payer: Self-pay | Admitting: *Deleted

## 2021-01-15 NOTE — Telephone Encounter (Signed)
Called patient and asked her to come in to pick up IFOBT kit. She says she will try to pick it up today. Kit left at front desk.Jaymes Graff Maija Biggers

## 2021-02-03 ENCOUNTER — Other Ambulatory Visit: Payer: Self-pay | Admitting: Family Medicine

## 2021-02-04 ENCOUNTER — Ambulatory Visit: Payer: Self-pay | Admitting: Physician Assistant

## 2021-02-04 ENCOUNTER — Other Ambulatory Visit: Payer: Self-pay

## 2021-02-04 VITALS — BP 127/61 | HR 97 | Temp 98.2°F | Resp 18 | Ht 65.0 in | Wt 157.0 lb

## 2021-02-04 DIAGNOSIS — F411 Generalized anxiety disorder: Secondary | ICD-10-CM

## 2021-02-04 DIAGNOSIS — B351 Tinea unguium: Secondary | ICD-10-CM

## 2021-02-04 DIAGNOSIS — R7303 Prediabetes: Secondary | ICD-10-CM

## 2021-02-04 DIAGNOSIS — L853 Xerosis cutis: Secondary | ICD-10-CM

## 2021-02-04 DIAGNOSIS — F439 Reaction to severe stress, unspecified: Secondary | ICD-10-CM

## 2021-02-04 DIAGNOSIS — F5104 Psychophysiologic insomnia: Secondary | ICD-10-CM

## 2021-02-04 LAB — POCT GLYCOSYLATED HEMOGLOBIN (HGB A1C): Hemoglobin A1C: 6.3 % — AB (ref 4.0–5.6)

## 2021-02-04 MED ORDER — HYDROXYZINE HCL 10 MG PO TABS: ORAL_TABLET | ORAL | 0 refills | Status: DC

## 2021-02-04 NOTE — Patient Instructions (Signed)
Your A1c is 6.3.  I encourage you to work on reducing the amount of added sugar in your daily diet, make sure to continue drinking lots of water.  Make sure to follow-up with your primary care provider in the next few months so you can have this rechecked.  The medication I spoke about is called metformin.  For your anxiety, you will take hydroxyzine every 8 hours.  You can use 10 mg up to 30 mg.  You can also use this at bedtime to help you with insomnia.  Please let us know if there is anything else we can do for you  Kennieth Rad, PA-C Physician Assistant [place] [email or web address]    Diabetes Care, 44(Suppl 1), [phone]. https://doi.org/https://doi.org/10.2337/dc21-S003">  Prediabetes Prediabetes is when your blood sugar (blood glucose) level is higher than normal but not high enough for you to be diagnosed with type 2 diabetes. Having prediabetes puts you at risk for developing type 2 diabetes (type 2 diabetes mellitus). With certain lifestyle changes, you may be able to prevent or delay the onset of type 2 diabetes. This is important because type 2 diabetes can lead to serious complications, such as:  Heart disease.  Stroke.  Blindness.  Kidney disease.  Depression.  Poor circulation in the feet and legs. In severe cases, this could lead to surgical removal of a leg (amputation). What are the causes? The exact cause of prediabetes is not known. It may result from insulin resistance. Insulin resistance develops when cells in the body do not respond properly to insulin that the body makes. This can cause excess glucose to build up in the blood. High blood glucose (hyperglycemia) can develop. What increases the risk? The following factors may make you more likely to develop this condition:  You have a family member with type 2 diabetes.  You are older than 45 years.  You had a temporary form of diabetes during a  pregnancy (gestational diabetes).  You had polycystic ovary syndrome (PCOS).  You are overweight or obese.  You are inactive (sedentary).  You have a history of heart disease, including problems with cholesterol levels, high levels of blood fats, or high blood pressure. What are the signs or symptoms? You may have no symptoms. If you do have symptoms, they may include:  Increased hunger.  Increased thirst.  Increased urination.  Vision changes, such as blurry vision.  Tiredness (fatigue). How is this diagnosed? This condition can be diagnosed with blood tests. Your blood glucose may be checked with one or more of the following tests:  A fasting blood glucose (FBG) test. You will not be allowed to eat (you will fast) for at least 8 hours before a blood sample is taken.  An A1C blood test (hemoglobin A1C). This test provides information about blood glucose levels over the previous 2?3 months.  An oral glucose tolerance test (OGTT). This test measures your blood glucose at two points in time: ? After fasting. This is your baseline level. ? Two hours after you drink a beverage that contains glucose. You may be diagnosed with prediabetes if:  Your FBG is 100?125 mg/dL (5.6-6.9 mmol/L).  Your A1C level is 5.7?6.4% (39-46 mmol/mol).  Your OGTT result is 140?199 mg/dL (7.8-11 mmol/L). These blood tests may be repeated to confirm your diagnosis.   How is this treated? Treatment may include dietary and lifestyle changes to help lower your blood glucose and prevent type 2 diabetes from developing. In some cases, medicine may be  prescribed to help lower the risk of type 2 diabetes. Follow these instructions at home: Nutrition  Follow a healthy meal plan. This includes eating lean proteins, whole grains, legumes, fresh fruits and vegetables, low-fat dairy products, and healthy fats.  Follow instructions from your health care provider about eating or drinking restrictions.  Meet  with a dietitian to create a healthy eating plan that is right for you.   Lifestyle  Do moderate-intensity exercise for at least 30 minutes a day on 5 or more days each week, or as told by your health care provider. A mix of activities may be best, such as: ? Brisk walking, swimming, biking, and weight lifting.  Lose weight as told by your health care provider. Losing 5-7% of your body weight can reverse insulin resistance.  Do not drink alcohol if: ? Your health care provider tells you not to drink. ? You are pregnant, may be pregnant, or are planning to become pregnant.  If you drink alcohol: ? Limit how much you use to:  0-1 drink a day for women.  0-2 drinks a day for men. ? Be aware of how much alcohol is in your drink. In the [place] one drink equals one 12 oz bottle of beer (355 mL), one 5 oz glass of wine (148 mL), or one 1 oz glass of hard liquor (44 mL). General instructions  Take over-the-counter and prescription medicines only as told by your health care provider. You may be prescribed medicines that help lower the risk of type 2 diabetes.  Do not use any products that contain nicotine or tobacco, such as cigarettes, e-cigarettes, and chewing tobacco. If you need help quitting, ask your health care provider.  Keep all follow-up visits. This is important. Where to find more information  American Diabetes Association: www.diabetes.org  Academy of Nutrition and Dietetics: www.eatright.org  American Heart Association: www.heart.org Contact a health care provider if:  You have any of these symptoms: ? Increased hunger. ? Increased urination. ? Increased thirst. ? Fatigue. ? Vision changes, such as blurry vision. Get help right away if you:  Have shortness of breath.  Feel confused.  Vomit or feel like you may vomit. Summary  Prediabetes is when your blood sugar (blood glucose)level is higher than normal but not high enough for you to be diagnosed with type 2  diabetes.  Having prediabetes puts you at risk for developing type 2 diabetes (type 2 diabetes mellitus).  Make lifestyle changes such as eating a healthy diet and exercising regularly to help prevent diabetes. Lose weight as told by your health care provider. This information is not intended to replace advice given to you by your health care provider. Make sure you discuss any questions you have with your health care provider. Document Revised: 12/20/2019 Document Reviewed: 12/20/2019 Elsevier Patient Education  2021 [employer] (9th ed., pp. [phone]). [place]: [employer].">  Stress, Adult Stress is a normal reaction to life events. Stress is what you feel when life demands more than you are used to, or more than you think you can handle. Some stress can be useful, such as studying for a test or meeting a deadline at work. Stress that occurs too often or for too long can cause problems. It can affect your emotional health and interfere with relationships and normal daily activities. Too much stress can weaken your body's defense system (immune system) and increase your risk for physical illness. If you already have a medical  problem, stress can make it worse. What are the causes? All sorts of life events can cause stress. An event that causes stress for one person may not be stressful for another person. Major life events, whether positive or negative, commonly cause stress. Examples include:  Losing a job or starting a new job.  Losing a loved one.  Moving to a new town or home.  Getting married or divorced.  Having a baby.  Getting injured or sick. Less obvious life events can also cause stress, especially if they occur day after day or in combination with each other. Examples include:  Working long hours.  Driving in traffic.  Caring for children.  Being in debt.  Being in a difficult relationship. What are the signs or  symptoms? Stress can cause emotional symptoms, including:  Anxiety. This is feeling worried, afraid, on edge, overwhelmed, or out of control.  Anger, including irritation or impatience.  Depression. This is feeling sad, down, helpless, or guilty.  Trouble focusing, remembering, or making decisions. Stress can cause physical symptoms, including:  Aches and pains. These may affect your head, neck, back, stomach, or other areas of your body.  Tight muscles or a clenched jaw.  Low energy.  Trouble sleeping. Stress can cause unhealthy behaviors, including:  Eating to feel better (overeating) or skipping meals.  Working too much or putting off tasks.  Smoking, drinking alcohol, or using drugs to feel better. How is this diagnosed? Stress is diagnosed through an assessment by your health care provider. He or she may diagnose this condition based on:  Your symptoms and any stressful life events.  Your medical history.  Tests to rule out other causes of your symptoms. Depending on your condition, your health care provider may refer you to a specialist for further evaluation. How is this treated? Stress management techniques are the recommended treatment for stress. Medicine is not typically recommended for the treatment of stress. Techniques to reduce your reaction to stressful life events include:  Stress identification. Monitor yourself for symptoms of stress and identify what causes stress for you. These skills may help you to avoid or prepare for stressful events.  Time management. Set your priorities, keep a calendar of events, and learn to say no. Taking these actions can help you avoid making too many commitments. Techniques for coping with stress include:  Rethinking the problem. Try to think realistically about stressful events rather than ignoring them or overreacting. Try to find the positives in a stressful situation rather than focusing on the negatives.  Exercise.  Physical exercise can release both physical and emotional tension. The key is to find a form of exercise that you enjoy and do it regularly.  Relaxation techniques. These relax the body and mind. The key is to find one or more that you enjoy and use the techniques regularly. Examples include: ? Meditation, deep breathing, or progressive relaxation techniques. ? Yoga or tai chi. ? Biofeedback, mindfulness techniques, or journaling. ? Listening to music, being out in nature, or participating in other hobbies.  Practicing a healthy lifestyle. Eat a balanced diet, drink plenty of water, limit or avoid caffeine, and get plenty of sleep.  Having a strong support network. Spend time with family, friends, or other people you enjoy being around. Express your feelings and talk things over with someone you trust. Counseling or talk therapy with a mental health professional may be helpful if you are having trouble managing stress on your own.   Follow these instructions  at home: Lifestyle  Avoid drugs.  Do not use any products that contain nicotine or tobacco, such as cigarettes, e-cigarettes, and chewing tobacco. If you need help quitting, ask your health care provider.  Limit alcohol intake to no more than 1 drink a day for nonpregnant women and 2 drinks a day for men. One drink equals 12 oz of beer, 5 oz of wine, or 1 oz of hard liquor  Do not use alcohol or drugs to relax.  Eat a balanced diet that includes fresh fruits and vegetables, whole grains, lean meats, fish, eggs, and beans, and low-fat dairy. Avoid processed foods and foods high in added fat, sugar, and salt.  Exercise at least 30 minutes on 5 or more days each week.  Get 7-8 hours of sleep each night.   General instructions  Practice stress management techniques as discussed with your health care provider.  Drink enough fluid to keep your urine clear or pale yellow.  Take over-the-counter and prescription medicines only as told  by your health care provider.  Keep all follow-up visits as told by your health care provider. This is important.   Contact a health care provider if:  Your symptoms get worse.  You have new symptoms.  You feel overwhelmed by your problems and can no longer manage them on your own. Get help right away if:  You have thoughts of hurting yourself or others. If you ever feel like you may hurt yourself or others, or have thoughts about taking your own life, get help right away. You can go to your nearest emergency department or call:  Your local emergency services (911 in the [place]  A suicide crisis helpline, such as the [facility] at 901-734-4018. This is open 24 hours a day. Summary  Stress is a normal reaction to life events. It can cause problems if it happens too often or for too long.  Practicing stress management techniques is the best way to treat stress.  Counseling or talk therapy with a mental health professional may be helpful if you are having trouble managing stress on your own. This information is not intended to replace advice given to you by your health care provider. Make sure you discuss any questions you have with your health care provider. Document Revised: 06/06/2020 Document Reviewed: 06/06/2020 Elsevier Patient Education  2021 [employer].

## 2021-02-04 NOTE — Progress Notes (Signed)
New Patient Office Visit  Subjective:  Patient ID: Kim Ross, female    DOB: 01-27-1956  Age: 65 y.o. MRN: [id number]  CC:  Chief Complaint  Patient presents with  . Skin Problem    HPI Kim Ross reports that she is being treated for dry skin and a fungal infection in her nails.  Reports that she has been taking the Terbanifine and using kenalog and OTC lotion and is starting to notice some improvement in her nailbeds.  Reports that she is concerned because she feels her hands "always look dirty."  Reports that she has been having increased stress and anxiety at home.  Reports that she has been having difficulty sleeping, difficulty falling asleep and staying asleep, endorses racing thoughts.  Has tried over-the-counter sleep medication without relief.   Past Medical History:  Diagnosis Date  . Anxiety   . Aspiration pneumonitis ([place]) 08/05/2019  . Breast cancer screening 12/30/2017  . Chest wall pain 11/05/2019  . Cocaine abuse ([place])   . Depression   . Drug intoxication ([place]) 08/05/2019  . Encounter for screening for HIV 08/05/2019  . Goiter 09/05/2019  . History of recurrent UTIs   . No-show for appointment 09/24/2020  . Urinary frequency 04/10/2013    Past Surgical History:  Procedure Laterality Date  . ABDOMINAL HYSTERECTOMY    . HEMORRHOID SURGERY      Family History  Problem Relation Age of Onset  . Asthma Mother   . Cancer Sister 73       Pancreatic    Social History   Socioeconomic History  . Marital status: Single    Spouse name: Not on file  . Number of children: Not on file  . Years of education: Not on file  . Highest education level: Not on file  Occupational History  . Not on file  Tobacco Use  . Smoking status: Current Every Day Smoker    Packs/day: 0.50    Types: Cigarettes  . Smokeless tobacco: Never Used  Vaping Use  . Vaping Use: Never used  Substance and Sexual Activity  . Alcohol use: Yes    Alcohol/week: 1.0 standard drink     Types: 1 Cans of beer per week  . Drug use: No  . Sexual activity: Never  Other Topics Concern  . Not on file  Social History Narrative  . Not on file   Social Determinants of Health   Financial Resource Strain: Not on file  Food Insecurity: Not on file  Transportation Needs: Not on file  Physical Activity: Not on file  Stress: Not on file  Social Connections: Not on file  Intimate Partner Violence: Not on file    ROS Review of Systems  Constitutional: Negative.   HENT: Negative.   Eyes: Negative.   Respiratory: Negative for shortness of breath.   Cardiovascular: Negative for chest pain.  Gastrointestinal: Negative.   Endocrine: Negative.   Genitourinary: Negative.   Musculoskeletal: Negative.   Skin: Positive for color change and rash.  Allergic/Immunologic: Negative.   Neurological: Negative.   Hematological: Negative.   Psychiatric/Behavioral: Positive for dysphoric mood and sleep disturbance. Negative for self-injury and suicidal ideas. The patient is nervous/anxious.     Objective:   Today's Vitals: BP 127/61 (BP Location: Left Arm, Patient Position: Sitting, Cuff Size: Normal)   Pulse 97   Temp 98.2 F (36.8 C) (Oral)   Resp 18   Ht 5\' 5"  (1.651 m)   Wt 157 lb (71.2 kg)  SpO2 100%   BMI 26.13 kg/m   Physical Exam Vitals and nursing note reviewed.  Constitutional:      Appearance: Normal appearance.  HENT:     Head: Normocephalic and atraumatic.     Right Ear: External ear normal.     Left Ear: External ear normal.     Nose: Nose normal.     Mouth/Throat:     Mouth: Mucous membranes are moist.     Pharynx: Oropharynx is clear.  Eyes:     Extraocular Movements: Extraocular movements intact.     Conjunctiva/sclera: Conjunctivae normal.     Pupils: Pupils are equal, round, and reactive to light.  Cardiovascular:     Rate and Rhythm: Normal rate and regular rhythm.     Pulses: Normal pulses.     Heart sounds: Normal heart sounds.  Pulmonary:      Effort: Pulmonary effort is normal.     Breath sounds: Normal breath sounds.  Musculoskeletal:     Cervical back: Normal range of motion and neck supple.  Feet:     Left foot:     Toenail Condition: Fungal disease present.    Comments: Healthy nail growing in on left great toe Skin:    General: Skin is warm.     Comments: Dry cracking skin noted on both hands, slightly darker on hands than on wrists and arms.  Neurological:     General: No focal deficit present.     Mental Status: She is alert and oriented to person, place, and time.  Psychiatric:        Mood and Affect: Mood normal.        Behavior: Behavior normal.        Thought Content: Thought content normal.        Judgment: Judgment normal.     Assessment & Plan:   Problem List Items Addressed This Visit      Musculoskeletal and Integument   Onychomycosis of left great toe     Other   Generalized anxiety disorder   Relevant Medications   hydrOXYzine (ATARAX/VISTARIL) 10 MG tablet   Dry skin - Primary   Prediabetes   Relevant Orders   HgB A1c (Completed)   Stress at home    Other Visit Diagnoses    Psychophysiological insomnia          Outpatient Encounter Medications as of 02/04/2021  Medication Sig  . atorvastatin (LIPITOR) 40 MG tablet TAKE 1 TABLET BY MOUTH EVERY DAY  . Elastic Bandages & Supports (ABDOMINAL BINDER/ELASTIC LARGE) MISC 1 Package by Does not apply route daily.  . hydrOXYzine (ATARAX/VISTARIL) 10 MG tablet Take 1-3 tabs PO q8hrs PRN  . lidocaine (LIDODERM) 5 % Place 1 patch onto the skin daily. Remove & Discard patch within 12 hours or as directed by MD  . meclizine (ANTIVERT) 25 MG tablet TAKE 1 TABLET BY MOUTH TWICE A DAY  . Multiple Vitamins-Minerals (MULTIVITAMIN WITH MINERALS) tablet Take 1 tablet by mouth daily.  . sertraline (ZOLOFT) 50 MG tablet TAKE 1 TABLET BY MOUTH EVERY DAY  . terbinafine (LAMISIL) 250 MG tablet Take 1 tablet (250 mg total) by mouth daily.  Marland Kitchen triamcinolone  (KENALOG) 0.1 % Apply 1 application topically 2 (two) times daily.  Marland Kitchen triamcinolone ointment (KENALOG) 0.5 % Apply 1 application topically 2 (two) times daily. For moderate to severe eczema.  Do not use for more than 1 week at a time.  . nicotine (NICODERM CQ - DOSED IN MG/24 HR)  7 mg/24hr patch Place 1 patch (7 mg total) onto the skin daily. (Patient not taking: Reported on 02/04/2021)  . nicotine polacrilex (NICORETTE) 2 MG gum Take 1 each (2 mg total) by mouth as needed for smoking cessation. (Patient not taking: Reported on 02/04/2021)  . [DISCONTINUED] predniSONE (DELTASONE) 20 MG tablet Take 2 tablets (40 mg total) by mouth daily with breakfast. For the next four days   No facility-administered encounter medications on file as of 02/04/2021.   1. Dry skin Encourage patient to continue with current treatment, consider referral to dermatology.  Patient does not want referral at this time  2. Generalized anxiety disorder Patient declines referral for CBT.  Trial hydroxyzine every 8 hours as needed, patient education given on stress management.  Red flags given for prompt reevaluation - hydrOXYzine (ATARAX/VISTARIL) 10 MG tablet; Take 1-3 tabs PO q8hrs PRN  Dispense: 30 tablet; Refill: 0  3. Psychophysiological insomnia Trial hydroxyzine, patient education given on good sleep hygiene  4. Stress at home   5. Onychomycosis of left great toe Continue current treatment, managed by primary care provider  6. Prediabetes A1c 6.3, this has been 6.3 for at least the last year.  Patient education given on lifestyle modifications.  Patient declines trial of metformin at this time. - HgB A1c  Patient encouraged to continue follow-up with primary care provider as scheduled, encouraged to return to mobile unit if needed.   I have reviewed the patient's medical history (PMH, PSH, Social History, Family History, Medications, and allergies) , and have been updated if relevant. I spent 36 minutes reviewing  chart and  face to face time with patient.    Follow-up: Return if symptoms worsen or fail to improve.   Loraine Grip Mayers, PA-C

## 2021-02-04 NOTE — Progress Notes (Signed)
Patient reports using the kenalog cream and reports minimal relief. Patient denies any symptoms prior to this year. Patient complains of constantly washing her hands and the water rinsing brown.

## 2021-02-05 DIAGNOSIS — F439 Reaction to severe stress, unspecified: Secondary | ICD-10-CM | POA: Insufficient documentation

## 2021-02-05 DIAGNOSIS — F5104 Psychophysiologic insomnia: Secondary | ICD-10-CM | POA: Insufficient documentation

## 2021-02-05 DIAGNOSIS — R7303 Prediabetes: Secondary | ICD-10-CM | POA: Insufficient documentation

## 2021-02-10 ENCOUNTER — Telehealth: Payer: Self-pay

## 2021-02-10 NOTE — Telephone Encounter (Signed)
Patient calls nurse line requesting to follow up on visit from 5/4 with [facility]. Patient had A1c drawn at this visit that resulted at 6.3. Patient is asking if she should start taking metformin due to this level.   Will forward to PCP  Please advise.   Talbot Grumbling, RN

## 2021-02-11 ENCOUNTER — Other Ambulatory Visit: Payer: Self-pay | Admitting: Family Medicine

## 2021-02-11 DIAGNOSIS — R7303 Prediabetes: Secondary | ICD-10-CM

## 2021-02-11 MED ORDER — METFORMIN HCL 500 MG PO TABS: 500.0000 mg | ORAL_TABLET | Freq: Two times a day (BID) | ORAL | 1 refills | Status: DC

## 2021-02-11 NOTE — Progress Notes (Signed)
Telephone encounter  Patient contacted the clinic regarding her interest in starting metformin for A1c of 6.3%.  We had considered initiating metformin a while ago, patient is ready to start medication.  Prescription for metformin 500 mg once daily sent to patient's pharmacy [facility] on [address].  She was informed of the potential side effects with this medication including upset stomach, nausea, vomiting, diarrhea.  Was instructed to change diet if she had abdominal upset by excluding carbohydrate rich foods (potatoes, rice, pastas, breads, etc.) if she had side effects from the medication.  Milus Banister, [place], PGY-3 02/11/2021 2:49 PM

## 2021-03-09 ENCOUNTER — Telehealth: Payer: Self-pay

## 2021-03-09 NOTE — Telephone Encounter (Signed)
Called Kim Ross to discuss options.  She reported that she had completed a 90-day course of terbinafine and she noticed some improvement of her toenails but not of her fingernails.  She reports that she is starting to see healthy toenails grow out.  She has not yet seen significant changes with her fingernails although she has noted some darkened skin of her palms of little bit of dryness.  We discussed that it sounds like the terbinafine has worked well and she is not having healthy nail development.  It may not be necessary to use additional antifungal therapy.  I would have expected her fingernails to respond by now because that treatment is typically shorter duration been for a fungal infection of the toenails.  Since her fingernails have not responded, she was encouraged to come into clinic for additional assessment.  May be helpful to actually take fingernail clippings to assess for fungal infection.  No additional medication was prescribed.  She was agreeable to being seen in clinic again for hand/fingernail concerns.  No medication was prescribed.  She was agreeable to being seen in clinic again for her hand/fingernail concerns.  Kim Haymaker, MD

## 2021-03-09 NOTE — Telephone Encounter (Signed)
Patient calls nurse line regarding issues with Terbinafine rx. Called pharmacy. Per pharmacist, insurance has quantity limit of 90 tablets per year.   Please advise if an alternative would be appropriate. 30 day supply will cost $67 out of pocket, without insurance.   Talbot Grumbling, RN

## 2021-03-10 NOTE — Progress Notes (Signed)
    SUBJECTIVE:   CHIEF COMPLAINT / HPI:   Onychomycosis  Started treatment after 11/28/20 with terbinafine 250 mg daily x 4 months (end date 03/28/21). Last check in on 01/07/21, with normal CMP.  Patient reports that she has had some improvement in her left great toe nail. She reports that the nail looks like it is growing back normally from the nail bed. Her last dose of the medication was 3 days ago when she ran out. She called to get a refill, but was told she could not refill the medication at the pharmacy.  Patient does note that she is having the same issue in her fingernails (discoloration, thickening, and changes under the nail. She reports that this has not improved with medication over the last few months. She denies any fevers, chills.    Dry Skin Patient reports continued issues with the skin on the palmar aspect of her hands and fingers. She reports that she washes multiple times a day for work and has noticed discoloration of patches of her skin. She reports there hands can appear ashy. She has been using vaseline on her hands. She denies any pruritis.  PERTINENT  PMH / PSH: Prediabetes, onychomycosis, GAD, HLD, Tobacco use   OBJECTIVE:   BP 137/71   Pulse 98   Ht 5\' 5"  (1.651 m)   Wt 158 lb 3.2 oz (71.8 kg)   SpO2 96%   BMI 26.33 kg/m   General: well appaearing NAD  Derm: discoloration of some fingernails with thickening and subungal keratosis, dystrophic nail with vertical striae with streaky discoloration. Hyperkeratosis of palmar aspect of distal fingers and areas of hyperpgimentation.  .           ASSESSMENT/PLAN:   Dry skin Patient complains of thickened skin and areas of discoloration on her hands. She reports some improvement with triamcinolone. 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 Can also consider psoriasis given plaques present on elbows which  have been improving mildly with triamcinolone ointment and hyperkeratosis of distal fingers pads on exam. Continue triamcinolone ointment and intense hydration, especially after hand washing. Consider referral to dermatology pending results of nail cultures. If culture negative, could suggest psoriasis affecting multiple sites and may require further specialized treatment.   Hyperkeratosis of nail Patient reports some improvement, but on exam patient continues to have dark discoloration of most of her distal toe nail. I would expect more change after 90 days. Will obtain sample and send for culture today to ensure correct treatment.  Her fingernails appear to have similar appearance to picture from 11/28/20 when she started the terbinafine treatment. DDx for her fingerails include psoriasis, though there are vertical striae and not pitting and no evidence of splinter hemorrhages. Will obtain fungus culture for this as well. Given minimal improvement of terbinafine, will discontinue at this time and await culture results. Consider derm referral as above.     Wilber Oliphant, MD [place]

## 2021-03-11 ENCOUNTER — Ambulatory Visit (INDEPENDENT_AMBULATORY_CARE_PROVIDER_SITE_OTHER): Payer: No Typology Code available for payment source | Admitting: Family Medicine

## 2021-03-11 ENCOUNTER — Other Ambulatory Visit: Payer: Self-pay

## 2021-03-11 ENCOUNTER — Encounter: Payer: Self-pay | Admitting: Family Medicine

## 2021-03-11 VITALS — BP 137/71 | HR 98 | Ht 65.0 in | Wt 158.2 lb

## 2021-03-11 DIAGNOSIS — L608 Other nail disorders: Secondary | ICD-10-CM

## 2021-03-11 DIAGNOSIS — B351 Tinea unguium: Secondary | ICD-10-CM

## 2021-03-11 DIAGNOSIS — L853 Xerosis cutis: Secondary | ICD-10-CM

## 2021-03-11 DIAGNOSIS — L859 Epidermal thickening, unspecified: Secondary | ICD-10-CM

## 2021-03-11 NOTE — Patient Instructions (Signed)
For your nail changes, we are sending some samples off to be tested in the lab.  We will call you as soon as we have results for this.  For the skin on her hands, this is likely dry skin due to repeated washing.  You can continue to use the triamcinolone cream twice daily and continue to moisturize after each handwashing.

## 2021-03-11 NOTE — Assessment & Plan Note (Addendum)
Patient reports some improvement, but on exam patient continues to have dark discoloration of most of her distal toe nail. I would expect more change after 90 days. Will obtain sample and send for culture today to ensure correct treatment.  Her fingernails appear to have similar appearance to picture from 11/28/20 when she started the terbinafine treatment. DDx for her fingerails include psoriasis, though there are vertical striae and not pitting and no evidence of splinter hemorrhages. Will obtain fungus culture for this as well. Given minimal improvement of terbinafine, will discontinue at this time and await culture results. Consider derm referral as above.

## 2021-03-11 NOTE — Assessment & Plan Note (Signed)
Patient complains of thickened skin and areas of discoloration on her hands. She reports some improvement with triamcinolone. 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 Can also consider psoriasis given plaques present on elbows which have been improving mildly with triamcinolone ointment and hyperkeratosis of distal fingers pads on exam. Continue triamcinolone ointment and intense hydration, especially after hand washing. Consider referral to dermatology pending results of nail cultures. If culture negative, could suggest psoriasis affecting multiple sites and may require further specialized treatment.

## 2021-03-25 ENCOUNTER — Encounter: Payer: Self-pay | Admitting: Family Medicine

## 2021-03-25 ENCOUNTER — Ambulatory Visit (INDEPENDENT_AMBULATORY_CARE_PROVIDER_SITE_OTHER): Payer: No Typology Code available for payment source | Admitting: Family Medicine

## 2021-03-25 ENCOUNTER — Other Ambulatory Visit: Payer: Self-pay

## 2021-03-25 VITALS — BP 124/60 | HR 90 | Ht 65.0 in | Wt 155.6 lb

## 2021-03-25 DIAGNOSIS — R399 Unspecified symptoms and signs involving the genitourinary system: Secondary | ICD-10-CM

## 2021-03-25 DIAGNOSIS — R3121 Asymptomatic microscopic hematuria: Secondary | ICD-10-CM

## 2021-03-25 DIAGNOSIS — R5383 Other fatigue: Secondary | ICD-10-CM

## 2021-03-25 DIAGNOSIS — R7303 Prediabetes: Secondary | ICD-10-CM

## 2021-03-25 DIAGNOSIS — N3001 Acute cystitis with hematuria: Secondary | ICD-10-CM

## 2021-03-25 DIAGNOSIS — R42 Dizziness and giddiness: Secondary | ICD-10-CM

## 2021-03-25 LAB — POCT URINALYSIS DIP (CLINITEK)
Bilirubin, UA: NEGATIVE
Glucose, UA: NEGATIVE mg/dL
Ketones, POC UA: NEGATIVE mg/dL
Leukocytes, UA: NEGATIVE
Nitrite, UA: POSITIVE — AB
POC PROTEIN,UA: NEGATIVE
Spec Grav, UA: >=1.03 — AB (ref 1.010–1.025)
Urobilinogen, UA: 0.2 E.U./dL
pH, UA: 5.5 (ref 5.0–8.0)

## 2021-03-25 MED ORDER — CEPHALEXIN 500 MG PO CAPS
500.0000 mg | ORAL_CAPSULE | Freq: Three times a day (TID) | ORAL | 0 refills | Status: AC
Start: 2021-03-25 — End: 2021-03-30

## 2021-03-25 NOTE — Progress Notes (Signed)
    SUBJECTIVE:   CHIEF COMPLAINT / HPI: "UTI/metformin"   Kim Ross is a 65 year old female presenting to discuss the following concerns:  Metformin concern: Started metformin in 02/2021, shortly after starting she started experiencing intermittent lightheadedness, fatigue, and intermittent shortness of breath.  Because of this last week she did a few days of every other day dosage and then completely stopped late last week.  She is already feeling significantly better, still feeling a little bit tired but no further lightheadedness/dizziness and shortness of breath has improved.  During this time she had no associated fever, worsening cough (has some at baseline with tobacco use), congestion/sore throat, chest pain, leg swelling, or palpitations.  She also had some diarrhea when first starting but used Imodium with success.  Does not drink much water throughout the day. Smoking since age 43, 1/2 ppd per day.  Urinary symptoms: Since late last week she has noticed increased frothiness to her urine with a foul odor and some side pains.  Some intermittent dysuria.  States this feels identical to previous UTIs.   PERTINENT  PMH / PSH: IBS, history of recurrent UTIs, anxiety, tobacco use, prediabetes, hyperlipidemia  OBJECTIVE:   BP 124/60   Pulse 90   Ht 5\' 5"  (1.651 m)   Wt 155 lb 9.6 oz (70.6 kg)   SpO2 98%   BMI 25.89 kg/m    General: Alert, NAD HEENT: NCAT, MMM, bilateral TM clear  Cardiac: RRR  Lungs: Clear bilaterally without wheezing or crackles, no increased WOB, able to speak in full long sentences without any difficulty, no respiratory concern while sitting or walking in the clinic Abdomen: soft Msk: Moves all extremities spontaneously, normal gait  Ext: Warm, dry, 2+ distal pulses, no LE edema b/l   ASSESSMENT/PLAN:   UTI (urinary tract infection) Symptoms atypical but identical to previous UTIs that she has had, U/A with positive nitrates.  Will treat with Keflex X 5 days.   Urine sent for culture.  She has a known history of recurrent UTIs, however recently had not had one in quite some time.  If she does start having recurrent UTIs again would recommend trial of cranberry supplement, vitamin C, and d-mannose in addition to appropriate hygiene/hydration.  Hematuria Chronic and microscopic, U/a with moderate hemoglobin today.  Previously had a CT in 2016 without any obvious etiology, has not seen urology yet and would greatly benefit given history of tobacco use.  Fortunately she states with new insurance she is going to be able to see them soon.  Fatigue Associated with feeling intermittently lightheaded/shortness of breath over the past month after starting metformin, significantly improved after discontinuation last week.  Unclear if truly related to the medication vs concurrent concern, however believe it is reasonable to assess how she does over the next week and if the symptoms are persistent after to pursue further work-up.  Well-appearing today.  Will obtain CBC, BMP today just to ensure no significant electrolyte derangement/lactic acidosis/anemia.  Prediabetes Continue cessation of metformin.  Discussed lifestyle changes including diet and increasing physical activity which patient does appear to be motivated towards for control.    Follow-up in 2 weeks or sooner if needed, especially if any worsening or new symptoms.  Kim Ross, [name]

## 2021-03-25 NOTE — Patient Instructions (Signed)
It was wonderful to see you today.  I will send in an antibiotic for your likely UTI, however we are also to send your urine for culture and if it is growing any bacteria.  Please make sure you continue to drink plenty of fluids and follow a balanced diet.  Slowly work towards quitting smoking if you can.  If you are still feeling fatigued/short of breath in the next 1-2 weeks or sooner if associated with any chest pain or worsening then please follow-up/go to ED.

## 2021-03-25 NOTE — Progress Notes (Signed)
poc

## 2021-03-26 ENCOUNTER — Encounter: Payer: Self-pay | Admitting: Family Medicine

## 2021-03-26 DIAGNOSIS — R5383 Other fatigue: Secondary | ICD-10-CM | POA: Insufficient documentation

## 2021-03-26 LAB — CBC WITH DIFFERENTIAL/PLATELET
Basophils Absolute: 0.1 10*3/uL (ref 0.0–0.2)
Basos: 1 %
EOS (ABSOLUTE): 0.2 10*3/uL (ref 0.0–0.4)
Eos: 2 %
Hematocrit: 40 % (ref 34.0–46.6)
Hemoglobin: 12.8 g/dL (ref 11.1–15.9)
Immature Grans (Abs): 0 10*3/uL (ref 0.0–0.1)
Immature Granulocytes: 0 %
Lymphocytes Absolute: 2.6 10*3/uL (ref 0.7–3.1)
Lymphs: 24 %
MCH: 28.4 pg (ref 26.6–33.0)
MCHC: 32 g/dL (ref 31.5–35.7)
MCV: 89 fL (ref 79–97)
Monocytes Absolute: 0.7 10*3/uL (ref 0.1–0.9)
Monocytes: 6 %
Neutrophils Absolute: 7.3 10*3/uL — ABNORMAL HIGH (ref 1.4–7.0)
Neutrophils: 67 %
Platelets: 358 10*3/uL (ref 150–450)
RBC: 4.51 x10E6/uL (ref 3.77–5.28)
RDW: 14.3 % (ref 11.7–15.4)
WBC: 10.9 10*3/uL — ABNORMAL HIGH (ref 3.4–10.8)

## 2021-03-26 LAB — BASIC METABOLIC PANEL
BUN/Creatinine Ratio: 21 (ref 12–28)
BUN: 14 mg/dL (ref 8–27)
CO2: 19 mmol/L — ABNORMAL LOW (ref 20–29)
Calcium: 9.1 mg/dL (ref 8.7–10.3)
Chloride: 106 mmol/L (ref 96–106)
Creatinine, Ser: 0.68 mg/dL (ref 0.57–1.00)
Glucose: 74 mg/dL (ref 65–99)
Potassium: 4.5 mmol/L (ref 3.5–5.2)
Sodium: 141 mmol/L (ref 134–144)
eGFR: 98 mL/min/{1.73_m2} (ref >59)

## 2021-03-26 NOTE — Assessment & Plan Note (Signed)
Chronic and microscopic, U/a with moderate hemoglobin today.  Previously had a CT in 2016 without any obvious etiology, has not seen urology yet and would greatly benefit given history of tobacco use.  Fortunately she states with new insurance she is going to be able to see them soon.

## 2021-03-26 NOTE — Assessment & Plan Note (Signed)
Associated with feeling intermittently lightheaded/shortness of breath over the past month after starting metformin, significantly improved after discontinuation last week.  Unclear if truly related to the medication vs concurrent concern, however believe it is reasonable to assess how she does over the next week and if the symptoms are persistent after to pursue further work-up.  Well-appearing today.  Will obtain CBC, BMP today just to ensure no significant electrolyte derangement/lactic acidosis/anemia.

## 2021-03-26 NOTE — Assessment & Plan Note (Signed)
Symptoms atypical but identical to previous UTIs that she has had, U/A with positive nitrates.  Will treat with Keflex X 5 days.  Urine sent for culture.  She has a known history of recurrent UTIs, however recently had not had one in quite some time.  If she does start having recurrent UTIs again would recommend trial of cranberry supplement, vitamin C, and d-mannose in addition to appropriate hygiene/hydration.

## 2021-03-26 NOTE — Assessment & Plan Note (Signed)
Continue cessation of metformin.  Discussed lifestyle changes including diet and increasing physical activity which patient does appear to be motivated towards for control.

## 2021-04-01 LAB — URINE CULTURE

## 2021-04-07 ENCOUNTER — Telehealth: Payer: Self-pay | Admitting: Dermatology

## 2021-04-07 NOTE — Telephone Encounter (Signed)
Referral attached to appointment

## 2021-04-07 NOTE — Telephone Encounter (Signed)
Patient is calling for a referral appointment from [facility]' office.  Patient is scheduled for 10/07/2021 at 2:00 with Lavonna Monarch, MD.  Patient also wants referral sent back to Dr. Erin Hearing to see if they can find something sooner.

## 2021-04-08 LAB — FUNGUS CULTURE, BLOOD

## 2021-04-20 ENCOUNTER — Emergency Department (HOSPITAL_COMMUNITY)
Admission: EM | Admit: 2021-04-20 | Discharge: 2021-04-21 | Disposition: A | Discharge status: Home / Self Care | Payer: No Typology Code available for payment source | Attending: Emergency Medicine | Admitting: Emergency Medicine

## 2021-04-20 ENCOUNTER — Other Ambulatory Visit: Payer: Self-pay

## 2021-04-20 ENCOUNTER — Encounter (HOSPITAL_COMMUNITY): Payer: Self-pay

## 2021-04-20 DIAGNOSIS — U071 COVID-19: Secondary | ICD-10-CM | POA: Insufficient documentation

## 2021-04-20 DIAGNOSIS — Z5321 Procedure and treatment not carried out due to patient leaving prior to being seen by health care provider: Secondary | ICD-10-CM | POA: Insufficient documentation

## 2021-04-20 DIAGNOSIS — K0889 Other specified disorders of teeth and supporting structures: Secondary | ICD-10-CM | POA: Insufficient documentation

## 2021-04-20 DIAGNOSIS — R109 Unspecified abdominal pain: Secondary | ICD-10-CM | POA: Insufficient documentation

## 2021-04-20 NOTE — ED Triage Notes (Signed)
Pt has a list of complaints. She states that her niece tested positive for Covid today. She states that when she tested herself it was negative but she wants to take another test to be sure. Pt complain of dental pain from a tooth being pulled weeks ago. Pt complains of left sided flank pain that has been going on for a week.

## 2021-04-22 NOTE — Progress Notes (Deleted)
   SUBJECTIVE:   CHIEF COMPLAINT / HPI:   No chief complaint on file.    Kim Ross is a 65 y.o. female here for the following complaints:    UTI: At previous visit, was dx with UTI and was given Keflex x5 days. Has hx of multiple UTIs in the past. Pt complains of {UTI sx:16137} for {0-10:33138} {:11}.  Patient also complains of {ros UTI:16138}. Patient denies {ros UTI:16138}.  Patient {does/does not:33181} have a history of recurrent UTI.  Patient {does/does not:33181} have a history of pyelonephritis.  Fatigue: Pt had onset of fatigue after starting metformin, was unsure if the metformin was the cause of her symptoms. Stopped metformin. Today she reports ***  Prediabetes: Pt reports ***  PERTINENT  PMH / PSH: reviewed and updated as appropriate   OBJECTIVE:   There were no vitals taken for this visit.   Physical General: {gen appear:16600} Abdomen: {abd exam:14935} {abd. site:5010} Back: {back exam:16126} GU: {BW:62035}   ASSESSMENT/PLAN:   No problem-specific Assessment & Plan notes found for this encounter.    Erskine Emery, MD PGY-1, [facility] 04/24/2021      {    This will disappear when note is signed, click to select method of visit    :1}

## 2021-04-24 ENCOUNTER — Ambulatory Visit: Payer: No Typology Code available for payment source | Admitting: Student

## 2021-04-28 ENCOUNTER — Ambulatory Visit: Payer: No Typology Code available for payment source | Admitting: Family Medicine

## 2021-04-28 NOTE — Progress Notes (Signed)
SUBJECTIVE:   CHIEF COMPLAINT / HPI: dyspnea, fatigue, and weight loss.  Dyspnea: patient reports a multiple month history of dyspnea both at rest and with exertion, fatigue, and weight loss. She reports that she feels like she never has any energy. She has lost about 20 lbs in 1 year unintentionally, but has lost 11lbs in the last month. She also reports chest pain that feels like a pressure sitting on her chest. This does not occur with exertion, but happens first thing in the morning, nearly every day for several wfeeks. She currently does not have chest pain. She has mild HA, dry cough, rhinorrhea; denies fever chills sore throat, rash, n/v/d. Patient has not had a recent lipid panel or work up of ASCVD risk. Patient smokes cigarettes, currently only smokes 1-2 per day. The majority of her smoking life has been 1/2 ppd x 40 years.   Congestion: patient reports 3 days of nasal congestion, myalgia, fatigue. She took a home covid test that was negative and would like to have testing done today. Denies fevers and chills, sore throat; endorses rhinorrhea, HA, and cough.  Hematuria/UTI: Patient reports that she thinks she has a UTI. She states that urination "feels funny" and describes "bubbles" in her urine. On review of chart, patient has a history of hematuria and has been referred to urology numerous times, without making an appointment. Will obtain UA and urine culture, if possible. Reminded patient of importance of following up with Urology.  Healthcare maintenance: 65 yo woman who has never had a colonoscopy. Reminded patient of importance of procedure. She has new insurance and can access this resource now.  PERTINENT  PMH / PSH: tobacco use, hematuria, GAD  OBJECTIVE:   BP (!) 118/56   Pulse 96   Ht 5\' 5"  (1.651 m)   Wt 147 lb 8 oz (66.9 kg)   SpO2 96%   BMI 24.55 kg/m   Nursing note and vitals reviewed GEN: age-appropriate, AAW, resting comfortably in chair, NAD, WNWD, alert and  at baseline HEENT: NCAT. PERRLA. Sclera without injection or icterus. MMM. Clear oropharynx. Nasal passages erythematous and edematous with clear, thin secretions. TM's w/o nb, ne b/l.  Neck: Supple. No LAD. Cardiac: Regular rate and rhythm. Normal S1/S2. No murmurs, rubs, or gallops appreciated. 2+ radial pulses. Lungs: Clear bilaterally to ascultation. No increased WOB, no accessory muscle usage. No w/r/r. Abdomen: Normoactive bowel sounds. No tenderness to deep or light palpation. No rebound or guarding. No CVA tenderness.  Ext: no edema Psych: Pleasant and appropriate  ASSESSMENT/PLAN:   Seasonal allergies Patient with congestion, possibly viral infection, but she has longstanding allergies. COVID test done, negative. Refill flonase. Counseled on tobacco use, precontemplative phase of change.  Hematuria Patient has new insurance and can see urology. She has active referral. Given history of smoking, consistent hematuria, weight loss and fatigue, imperative that patient sees urology as soon as possible. Left message for patient, will remind her to get appointment.   UTI (urinary tract infection) Patient has UA c/w infection again. Unable to get culture because not enough sample. Last culture at end of June e. Coli, pan sensitive. She completed keflex last month, will treat with 10 days of bactrim.  Dyspnea Patient with several month h/o dyspnea, fatigue, weight loss. H/o smoking, no colonoscopy. Concern for possible malignancy (see hematuria problem). Unclear if dyspnea is cardiac or pulmonary in etiology, will start with cardiology work up as she has atypical chest pain. Ordered chest x-ray, echo. CBC  with mild leukocytosis, BMP with mildly elevated K to 5.5. Will have patient follow up this week for repeat blood work and ensure she has completed cardiac workup. If nothing for cardiac work up, will get PFTs and consider IGRA for good measure.     Gladys Damme, MD [place]

## 2021-04-29 ENCOUNTER — Other Ambulatory Visit: Payer: Self-pay

## 2021-04-29 ENCOUNTER — Ambulatory Visit (INDEPENDENT_AMBULATORY_CARE_PROVIDER_SITE_OTHER): Payer: Self-pay | Admitting: Family Medicine

## 2021-04-29 VITALS — BP 118/56 | HR 96 | Ht 65.0 in | Wt 147.5 lb

## 2021-04-29 DIAGNOSIS — Z13220 Encounter for screening for lipoid disorders: Secondary | ICD-10-CM

## 2021-04-29 DIAGNOSIS — R0602 Shortness of breath: Secondary | ICD-10-CM

## 2021-04-29 DIAGNOSIS — J069 Acute upper respiratory infection, unspecified: Secondary | ICD-10-CM

## 2021-04-29 DIAGNOSIS — R399 Unspecified symptoms and signs involving the genitourinary system: Secondary | ICD-10-CM

## 2021-04-29 DIAGNOSIS — N3001 Acute cystitis with hematuria: Secondary | ICD-10-CM

## 2021-04-29 DIAGNOSIS — R079 Chest pain, unspecified: Secondary | ICD-10-CM

## 2021-04-29 DIAGNOSIS — J302 Other seasonal allergic rhinitis: Secondary | ICD-10-CM

## 2021-04-29 DIAGNOSIS — J309 Allergic rhinitis, unspecified: Secondary | ICD-10-CM

## 2021-04-29 DIAGNOSIS — R7303 Prediabetes: Secondary | ICD-10-CM

## 2021-04-29 DIAGNOSIS — R3121 Asymptomatic microscopic hematuria: Secondary | ICD-10-CM

## 2021-04-29 LAB — POCT URINALYSIS DIP (MANUAL ENTRY)
Glucose, UA: NEGATIVE mg/dL
Nitrite, UA: POSITIVE — AB
Protein Ur, POC: NEGATIVE mg/dL
Spec Grav, UA: >=1.03 — AB (ref 1.010–1.025)
Urobilinogen, UA: 0.2 E.U./dL
pH, UA: 5.5 (ref 5.0–8.0)

## 2021-04-29 LAB — POCT GLYCOSYLATED HEMOGLOBIN (HGB A1C): HbA1c, POC (controlled diabetic range): 6.3 % (ref 0.0–7.0)

## 2021-04-29 MED ORDER — FLUTICASONE PROPIONATE 50 MCG/ACT NA SUSP: 1.0000 | Freq: Every day | NASAL | 12 refills | Status: DC

## 2021-04-30 ENCOUNTER — Telehealth: Payer: Self-pay

## 2021-04-30 LAB — COMPREHENSIVE METABOLIC PANEL
ALT: 14 IU/L (ref 0–32)
AST: 22 IU/L (ref 0–40)
Albumin/Globulin Ratio: 1.3 (ref 1.2–2.2)
Albumin: 4.1 g/dL (ref 3.8–4.8)
Alkaline Phosphatase: 178 IU/L — ABNORMAL HIGH (ref 44–121)
BUN/Creatinine Ratio: 15 (ref 12–28)
BUN: 13 mg/dL (ref 8–27)
Bilirubin Total: 0.2 mg/dL (ref 0.0–1.2)
CO2: 22 mmol/L (ref 20–29)
Calcium: 9.7 mg/dL (ref 8.7–10.3)
Chloride: 102 mmol/L (ref 96–106)
Creatinine, Ser: 0.86 mg/dL (ref 0.57–1.00)
Globulin, Total: 3.2 g/dL (ref 1.5–4.5)
Glucose: 94 mg/dL (ref 65–99)
Potassium: 5.5 mmol/L — ABNORMAL HIGH (ref 3.5–5.2)
Sodium: 139 mmol/L (ref 134–144)
Total Protein: 7.3 g/dL (ref 6.0–8.5)
eGFR: 76 mL/min/{1.73_m2} (ref >59)

## 2021-04-30 LAB — CBC WITH DIFFERENTIAL/PLATELET
Basophils Absolute: 0.1 10*3/uL (ref 0.0–0.2)
Basos: 0 %
EOS (ABSOLUTE): 0.2 10*3/uL (ref 0.0–0.4)
Eos: 1 %
Hematocrit: 42.4 % (ref 34.0–46.6)
Hemoglobin: 13.7 g/dL (ref 11.1–15.9)
Immature Grans (Abs): 0 10*3/uL (ref 0.0–0.1)
Immature Granulocytes: 0 %
Lymphocytes Absolute: 2.6 10*3/uL (ref 0.7–3.1)
Lymphs: 20 %
MCH: 28.4 pg (ref 26.6–33.0)
MCHC: 32.3 g/dL (ref 31.5–35.7)
MCV: 88 fL (ref 79–97)
Monocytes Absolute: 0.8 10*3/uL (ref 0.1–0.9)
Monocytes: 6 %
Neutrophils Absolute: 9.1 10*3/uL — ABNORMAL HIGH (ref 1.4–7.0)
Neutrophils: 73 %
Platelets: 414 10*3/uL (ref 150–450)
RBC: 4.82 x10E6/uL (ref 3.77–5.28)
RDW: 13.5 % (ref 11.7–15.4)
WBC: 12.7 10*3/uL — ABNORMAL HIGH (ref 3.4–10.8)

## 2021-04-30 LAB — NOVEL CORONAVIRUS, NAA: SARS-CoV-2, NAA: NOT DETECTED

## 2021-04-30 LAB — LIPID PANEL
Chol/HDL Ratio: 3.3 ratio (ref 0.0–4.4)
Cholesterol, Total: 117 mg/dL (ref 100–199)
HDL: 35 mg/dL — ABNORMAL LOW (ref >39)
LDL Chol Calc (NIH): 61 mg/dL (ref 0–99)
Triglycerides: 116 mg/dL (ref 0–149)
VLDL Cholesterol Cal: 21 mg/dL (ref 5–40)

## 2021-04-30 LAB — SARS-COV-2, NAA 2 DAY TAT

## 2021-04-30 NOTE — Telephone Encounter (Signed)
Patient calls nurse line regarding results from [facility] yesterday. Informed of negative COVID results.    Please return call to patient at [phone] once all labs have resulted.   Talbot Grumbling, RN

## 2021-05-01 ENCOUNTER — Emergency Department (HOSPITAL_COMMUNITY): Payer: Self-pay

## 2021-05-01 ENCOUNTER — Other Ambulatory Visit: Payer: Self-pay

## 2021-05-01 ENCOUNTER — Encounter (HOSPITAL_COMMUNITY): Payer: Self-pay

## 2021-05-01 ENCOUNTER — Emergency Department (HOSPITAL_COMMUNITY)
Admission: EM | Admit: 2021-05-01 | Discharge: 2021-05-02 | Disposition: A | Discharge status: Home / Self Care | Payer: Self-pay | Attending: Emergency Medicine | Admitting: Emergency Medicine

## 2021-05-01 DIAGNOSIS — Z5321 Procedure and treatment not carried out due to patient leaving prior to being seen by health care provider: Secondary | ICD-10-CM | POA: Insufficient documentation

## 2021-05-01 DIAGNOSIS — R0602 Shortness of breath: Secondary | ICD-10-CM

## 2021-05-01 DIAGNOSIS — R079 Chest pain, unspecified: Secondary | ICD-10-CM

## 2021-05-01 DIAGNOSIS — R0781 Pleurodynia: Secondary | ICD-10-CM | POA: Insufficient documentation

## 2021-05-01 LAB — BASIC METABOLIC PANEL
Anion gap: 8 (ref 5–15)
BUN: 16 mg/dL (ref 8–23)
CO2: 27 mmol/L (ref 22–32)
Calcium: 9.6 mg/dL (ref 8.9–10.3)
Chloride: 106 mmol/L (ref 98–111)
Creatinine, Ser: 0.78 mg/dL (ref 0.44–1.00)
GFR, Estimated: >60 mL/min (ref >60)
Glucose, Bld: 104 mg/dL — ABNORMAL HIGH (ref 70–99)
Potassium: 4.2 mmol/L (ref 3.5–5.1)
Sodium: 141 mmol/L (ref 135–145)

## 2021-05-01 LAB — CBC
HCT: 40.5 % (ref 36.0–46.0)
Hemoglobin: 13 g/dL (ref 12.0–15.0)
MCH: 28.9 pg (ref 26.0–34.0)
MCHC: 32.1 g/dL (ref 30.0–36.0)
MCV: 90 fL (ref 80.0–100.0)
Platelets: 402 10*3/uL — ABNORMAL HIGH (ref 150–400)
RBC: 4.5 MIL/uL (ref 3.87–5.11)
RDW: 14.3 % (ref 11.5–15.5)
WBC: 12.5 10*3/uL — ABNORMAL HIGH (ref 4.0–10.5)
nRBC: 0 % (ref 0.0–0.2)

## 2021-05-01 LAB — TROPONIN I (HIGH SENSITIVITY): Troponin I (High Sensitivity): 2 ng/L (ref <18)

## 2021-05-01 MED ORDER — SULFAMETHOXAZOLE-TRIMETHOPRIM 800-160 MG PO TABS: 1.0000 | ORAL_TABLET | Freq: Two times a day (BID) | ORAL | 0 refills | Status: DC

## 2021-05-01 NOTE — ED Provider Notes (Signed)
Emergency Medicine Provider Triage Evaluation Note  Kim Ross , a 65 y.o. female  was evaluated in triage.  Pt complains of cp.  Review of Systems  Positive: Cp, sob, occasional cough Negative: Fever, chills, runny nose, sneezing, n/v/d  Physical Exam  Ht 5\' 5"  (1.651 m)   Wt 67.1 kg   BMI 24.63 kg/m  Gen:   Awake, no distress   Resp:  Normal effort  MSK:   Moves extremities without difficulty  Other:    Medical Decision Making  Medically screening exam initiated at 3:48 PM.  Appropriate orders placed.  Kim Ross was informed that the remainder of the evaluation will be completed by another provider, this initial triage assessment does not replace that evaluation, and the importance of remaining in the ED until their evaluation is complete.  Patient endorsed pain to her left upper chest, back and rib cage pain along with some shortness of breath and occasional cough for the past 4 days.  Was seen by PCP 2 days ago for her symptom and states that her COVID test was negative however she was told that she has a urinary tract infection.  She does not have any urinary symptoms.  Patient worries that she may have pneumonia as her symptoms felt similar to prior pneumonia.  She is a smoker.   Domenic Moras, PA-C 05/01/21 1549    Hayden Rasmussen, MD 05/01/21 1927

## 2021-05-01 NOTE — Telephone Encounter (Signed)
Covid negative. CBC with mild left shift and elevated potassium to 5.5, creatinine WNL. Patient also has UA with signs of infection and is symptomatic. Concern for bladder malignancy given long history of recurrent UTIs, hematuria, and now with weight loss in setting of long smoking history. Unable to get culture this time, but she had pansensitive e. Coli on cx at the end of June 2022 and was treated with keflex. Will treat with bactrim, since sensitivity supports this. Patient has been referred to urology many times, and has active referral currently. Please give her phone number for [facility] if she calls back as she needs to be seen as soon as possible.   Bactrim sent in, it is twice a day for 10 days.  Attempted to call pt, left HIPAA compliant VM. Please have her schedule follow up appt with me or PCP (or available) next week for follow up on leukocytosis and elevated potassium.   Please remind her to get chest x-[facility] at earliest convenience at [facility].  Gladys Damme, MD [place] [facility], PGY-3

## 2021-05-01 NOTE — ED Triage Notes (Signed)
Patient c/o left upper chest pain, SOB, and pain in her left rib cage area pain. Patient states, "It feels like the time I had pneumonia."  Patient reports that she went to  her PCP 2 days for c/o chest pain, SOB, nasal congestion.

## 2021-05-03 ENCOUNTER — Encounter: Payer: Self-pay | Admitting: Student

## 2021-05-03 DIAGNOSIS — R918 Other nonspecific abnormal finding of lung field: Secondary | ICD-10-CM | POA: Insufficient documentation

## 2021-05-03 DIAGNOSIS — R0600 Dyspnea, unspecified: Secondary | ICD-10-CM | POA: Insufficient documentation

## 2021-05-03 NOTE — Assessment & Plan Note (Signed)
Patient has UA c/w infection again. Unable to get culture because not enough sample. Last culture at end of June e. Coli, pan sensitive. She completed keflex last month, will treat with 10 days of bactrim.

## 2021-05-03 NOTE — Assessment & Plan Note (Signed)
Patient with congestion, possibly viral infection, but she has longstanding allergies. COVID test done, negative. Refill flonase. Counseled on tobacco use, precontemplative phase of change.

## 2021-05-03 NOTE — Assessment & Plan Note (Addendum)
Patient with several month h/o dyspnea, fatigue, weight loss. H/o smoking, no colonoscopy. Concern for possible malignancy (see hematuria problem). Unclear if dyspnea is cardiac or pulmonary in etiology, will start with cardiology work up as she has atypical chest pain. Ordered chest x-ray, echo. CBC with mild leukocytosis, BMP with mildly elevated K to 5.5. Will have patient follow up this week for repeat blood work and ensure she has completed cardiac workup. If nothing for cardiac work up, will get PFTs and consider IGRA for good measure. CMP and lipid panel obtained to assess ASCVD risk, LDL appropriate at 61, on high intensity statin.

## 2021-05-03 NOTE — Assessment & Plan Note (Signed)
Patient has new insurance and can see urology. She has active referral. Given history of smoking, consistent hematuria, weight loss and fatigue, imperative that patient sees urology as soon as possible. Left message for patient, will remind her to get appointment.

## 2021-05-04 ENCOUNTER — Telehealth: Payer: Self-pay | Admitting: *Deleted

## 2021-05-04 NOTE — Telephone Encounter (Signed)
-----   Message from Erskine Emery, MD sent at 05/03/2021 10:02 PM EDT ----- Regarding: Follow up Hello,  This patient was seen in the ED on 7/29. I would like to follow up with her for her chest xray results, as she will need further imaging. Please could you call her and book an appointment with me or another provider?  Thank you!

## 2021-05-04 NOTE — Telephone Encounter (Signed)
LM for patient to call the office and make an appt with any provider this week to discuss her recent chest xray results.   Please help her schedule this when she calls back. Thanks.  Zimere Dunlevy,CMA

## 2021-05-05 ENCOUNTER — Other Ambulatory Visit: Payer: Self-pay

## 2021-05-05 ENCOUNTER — Other Ambulatory Visit: Payer: Self-pay | Admitting: Student

## 2021-05-05 ENCOUNTER — Ambulatory Visit (HOSPITAL_COMMUNITY)
Admission: RE | Admit: 2021-05-05 | Discharge: 2021-05-05 | Disposition: A | Discharge status: Home / Self Care | Payer: Self-pay | Source: Ambulatory Visit | Attending: Family Medicine | Admitting: Family Medicine

## 2021-05-05 DIAGNOSIS — I071 Rheumatic tricuspid insufficiency: Secondary | ICD-10-CM | POA: Insufficient documentation

## 2021-05-05 DIAGNOSIS — R0602 Shortness of breath: Secondary | ICD-10-CM

## 2021-05-05 DIAGNOSIS — R079 Chest pain, unspecified: Secondary | ICD-10-CM

## 2021-05-05 LAB — ECHOCARDIOGRAM COMPLETE
Area-P 1/2: 6.27 cm2
S' Lateral: 2.4 cm

## 2021-05-05 NOTE — Progress Notes (Signed)
  Echocardiogram 2D Echocardiogram has been performed.  Kim Ross 05/05/2021, 11:51 AM

## 2021-05-05 NOTE — Progress Notes (Signed)
This patient was seen in the ED on 7/29. Chest x-ray at ED showed two left sided pulmonary masses. She needs further evaluation with dedicated chest CT for next appointment with [facility].

## 2021-05-07 ENCOUNTER — Telehealth: Payer: Self-pay | Admitting: Family Medicine

## 2021-05-07 NOTE — Telephone Encounter (Signed)
Patient with history of weight loss, fatigue, dyspnea, and chest pain recently with normal echo. I had ordered cxr, but patient did not obtain this. She went to ED, and cxr there shows two new left-sided pulmonary masses. Patient also has history of dysuria/frequency/urgency and long history of hematuria. She has been referred to urology numerous times throughout the years and has not yet gone; there is active urology referral. Patient had signs of UTI on UA; I sent in rx for bactrim (unable to get enough volume for culture, and urine cx in June was pansensitive, she had keflex rx in June); however, I was unable to get a hold of her to discuss results, as was PCP and Jazmin. Patient scheduled tomorrow for visit at [facility] with Dr. Oleh Genin. At this visit, please explain to her concern for malignancy (patient is smoker, unclear at this time if it is primary lung or bladder, or elsewhere). At this visit please get stat CT chest with contrast scheduled for her as well as schedule an appointment with [facility] AND pulmonology referral/appointment during the visit as I am concerned she will be lost to follow up if we do not do these during the visit.   Gladys Damme, MD [place] [facility], PGY-3

## 2021-05-08 ENCOUNTER — Other Ambulatory Visit: Payer: Self-pay

## 2021-05-08 ENCOUNTER — Ambulatory Visit (INDEPENDENT_AMBULATORY_CARE_PROVIDER_SITE_OTHER): Payer: Self-pay | Admitting: Family Medicine

## 2021-05-08 ENCOUNTER — Encounter: Payer: Self-pay | Admitting: Family Medicine

## 2021-05-08 VITALS — BP 154/74 | HR 95 | Ht 65.0 in | Wt 146.6 lb

## 2021-05-08 DIAGNOSIS — R3121 Asymptomatic microscopic hematuria: Secondary | ICD-10-CM

## 2021-05-08 DIAGNOSIS — R918 Other nonspecific abnormal finding of lung field: Secondary | ICD-10-CM

## 2021-05-08 DIAGNOSIS — F17200 Nicotine dependence, unspecified, uncomplicated: Secondary | ICD-10-CM

## 2021-05-08 NOTE — Assessment & Plan Note (Signed)
Prior chest x-rays with 2 left-sided pulmonary masses.  Discussed the results with patient's and the concern for cancer in the setting of chronic tobacco use.  Patient is scheduled for CT of the chest with contrast on 8/12.  Pulmonology referral was placed at this visit as well, patient was given phone number to [facility].

## 2021-05-08 NOTE — Progress Notes (Signed)
    SUBJECTIVE:   CHIEF COMPLAINT / HPI:   Patient has been difficult to get into contact with previously regarding prior results.  She notes that she has been having this chest achiness that has been present for quite a while.  She was checked out at the [facility] long hospital last week for the same symptoms and was given a cardiac work-up at that time.  Discussed with Dr. Chauncey Reading, who had seen the patient previously.  Patient in need of urology appointment, chest CT with contrast to evaluate the pulmonary masses that were noted on the chest x-ray as well as a pulmonology referral.    During medication reconciliation, patient did note that she is limited taking 250 mg of metformin once daily as any more than that makes her nauseous.   PERTINENT  PMH / PSH: Reviewed  OBJECTIVE:   BP (!) 154/74   Pulse 95   Ht 5\' 5"  (1.651 m)   Wt 146 lb 9.6 oz (66.5 kg)   SpO2 100%   BMI 24.40 kg/m   General: NAD, well-appearing, well-nourished Respiratory: No respiratory distress, breathing comfortably, able to speak in full sentences Skin: warm and dry, no rashes noted on exposed skin Psych: Appropriate affect and mood  ASSESSMENT/PLAN:   Lung mass Prior chest x-rays with 2 left-sided pulmonary masses.  Discussed the results with patient's and the concern for cancer in the setting of chronic tobacco use.  Patient is scheduled for CT of the chest with contrast on 8/12.  Pulmonology referral was placed at this visit as well, patient was given phone number to [facility].   Hematuria Patient with asymptomatic hematuria and active referral for urology.  History of smoking, hematuria, weight loss and fatigue.  Urology appointment was made with patient during this visit for September 20th.  Discussed with patient the importance of consistent follow-up.     Rise Patience, [place]

## 2021-05-08 NOTE — Patient Instructions (Addendum)
We have our appointment for urology on September 20, you have had blood in your urine means that we need to rule out bladder cancer.  It is something we will need to make sure we can rule out any need to make sure you keep this appointment.  You were noted to have masses in your lung that need further evaluation to determine if they are cancerous or not. We are getting you set up with a CT of your chest to further look at them and have sent a referral to pulmonology. The contact for them is: [facility] phone number: [phone].   If you need any support with anything or when I talk to anybody further, please do not hesitate to contact our office and make an appointment.  We will to make sure that he feels well supported.

## 2021-05-08 NOTE — Assessment & Plan Note (Signed)
Patient with asymptomatic hematuria and active referral for urology.  History of smoking, hematuria, weight loss and fatigue.  Urology appointment was made with patient during this visit for September 20th.  Discussed with patient the importance of consistent follow-up.

## 2021-05-13 ENCOUNTER — Ambulatory Visit
Admission: RE | Admit: 2021-05-13 | Discharge: 2021-05-13 | Disposition: A | Discharge status: Home / Self Care | Payer: No Typology Code available for payment source | Source: Ambulatory Visit | Attending: Family Medicine | Admitting: Family Medicine

## 2021-05-13 ENCOUNTER — Other Ambulatory Visit: Payer: Self-pay

## 2021-05-13 DIAGNOSIS — R918 Other nonspecific abnormal finding of lung field: Secondary | ICD-10-CM

## 2021-05-13 MED ORDER — IOPAMIDOL (ISOVUE-300) INJECTION 61%
75.0000 mL | Freq: Once | INTRAVENOUS | Status: AC | PRN
  Administered 2021-05-13: 75 mL via INTRAVENOUS

## 2021-05-14 ENCOUNTER — Telehealth: Payer: Self-pay

## 2021-05-14 NOTE — Telephone Encounter (Signed)
Patient calls nurse line requesting to speak with provider regarding results from CT scan on 8/10.  Please advise.   Talbot Grumbling, RN

## 2021-05-14 NOTE — Telephone Encounter (Signed)
Called patient with results of CT scan that are highly suspicious for lung cancer with suspected metastatic mass. Other CT findings were also discussed including thyroid nodules, which we will defer further evaluation until the lung masses have been fully evaluated. Patient is aware of the findings and all questions were answered. Patient has pulmonology follow-up on 8/16 at [place], DO

## 2021-05-18 ENCOUNTER — Other Ambulatory Visit: Payer: Self-pay | Admitting: Family Medicine

## 2021-05-18 DIAGNOSIS — R7303 Prediabetes: Secondary | ICD-10-CM

## 2021-05-19 ENCOUNTER — Telehealth: Payer: Self-pay | Admitting: Emergency Medicine

## 2021-05-19 ENCOUNTER — Ambulatory Visit (INDEPENDENT_AMBULATORY_CARE_PROVIDER_SITE_OTHER): Payer: 59 | Admitting: Emergency Medicine

## 2021-05-19 ENCOUNTER — Other Ambulatory Visit: Payer: Self-pay

## 2021-05-19 ENCOUNTER — Encounter: Payer: Self-pay | Admitting: Emergency Medicine

## 2021-05-19 VITALS — BP 124/70 | HR 112 | Ht 65.0 in | Wt 140.0 lb

## 2021-05-19 DIAGNOSIS — R918 Other nonspecific abnormal finding of lung field: Secondary | ICD-10-CM

## 2021-05-19 MED ORDER — TRAMADOL HCL 50 MG PO TABS: ORAL_TABLET | ORAL | 0 refills | Status: DC

## 2021-05-19 NOTE — Telephone Encounter (Signed)
Pt informed of the following:  Covid test 8/26 between 8-3  Bronch 8/29 at 7:45am; 5:30am arrival time @ [facility] will send Super D Disk to [place] per Caryl Pina. Once we receive disk , we will send it to [facility] ENDO.  PET 8/30 at 1:00pm @ [facility].

## 2021-05-19 NOTE — Assessment & Plan Note (Signed)
Multiple left-sided lung masses, 2 solid lesions and a more groundglass lesion.  Also with a groundglass nodule in a solid nodule on the right.  Very concerning for primary lung cancer although other malignancy is possible.  She needs a tissue diagnosis ASAP and we will work on setting up bronchoscopy.  Would like to do navigational bronchoscopy so we can sample the groundglass lesion as well as the solid lesions.  She agrees with this plan.  I will try to get it arranged for 8/29.  She needs a PET scan and we will order this as well.  We reviewed her CT scan of the chest today. We will arrange for a PET scan We will arrange to perform bronchoscopy to better evaluate and biopsy your lung mass lesions.  This will be done as an outpatient under general anesthesia at [facility] endoscopy.  You will need a designated driver.  We will try to get this scheduled for 06/01/2021. Congratulations on decreasing your smoking.  Work on stopping your cigarettes altogether Follow with Dr Lamonte Sakai in 1 month

## 2021-05-19 NOTE — Patient Instructions (Signed)
We reviewed her CT scan of the chest today. We will arrange for a PET scan We will arrange to perform bronchoscopy to better evaluate and biopsy your lung mass lesions.  This will be done as an outpatient under general anesthesia at [facility] endoscopy.  You will need a designated driver.  We will try to get this scheduled for 06/01/2021. Congratulations on decreasing your smoking.  Work on stopping your cigarettes altogether Follow with Dr Lamonte Sakai in 1 month

## 2021-05-19 NOTE — Progress Notes (Signed)
Subjective:    Patient ID: Kim Ross, female    DOB: [birth year], 65 y.o.   MRN: [id number]  HPI 65 year old woman, active smoker (7-10 pack years) with a history of substance abuse, depression.  She is referred today for an abnormal CT scan of the chest. She underwent a chest x-ray on 05/01/2021 in the ED at [facility] when she was being evaluated for chest discomfort.  This identified a prominent rounded left hilum and to left-sided opacities one of the apex of one of the lower lobe.  This prompted a CT scan of the chest that was done on 05/13/2021 as below. She continues to have CP through to her back, using tylenol.  She can have SOB when she walks, especially quickly. She can do her usual daily activities. Her voice has changed some. No swallowing difficulty. Occasional anorexia, has lost 9 lbs over about 3 months. She has rare cough, no hemoptysis.   CT chest 05/13/2021 reviewed by me, shows circumferential distal esophageal wall thickening, bilateral thyroid nodules, centrally necrotic three 3.3 x 3.2 cm mass in the AP window with solid enhancing lymphadenopathy extending to the left infrahilar region.  There are also several lower para-aortic lymph nodes that are enlarged as well as a distal paraesophageal lymph node.  There is a left lower lobe mass bulging the major fissure 6.8 x 4.1 x 5.8 cm, solid mass in the apical posterior left upper lobe, 7 x 5 mm solid right upper lobe nodule as well as some other scattered smaller nodules.  There is an ill-defined enhancing lesion in the dome of the left hepatic lobe, suspect benign.   Review of Systems As per HPI  Past Medical History:  Diagnosis Date   Acute pain of left shoulder 12/01/2020   Anxiety    Aspiration pneumonitis ([place]) 08/05/2019   Breast cancer screening 12/30/2017   Chest wall pain 11/05/2019   Cocaine abuse ([place])    Depression    Drug intoxication ([place]) 08/05/2019   Encounter for screening for HIV 08/05/2019   Goiter  09/05/2019   History of recurrent UTIs    No-show for appointment 09/24/2020   Urinary frequency 04/10/2013     Family History  Problem Relation Age of Onset   Asthma Mother    Cancer Sister 7       Pancreatic  No hx lung CA in family  Social History   Socioeconomic History   Marital status: Single    Spouse name: Not on file   Number of children: Not on file   Years of education: Not on file   Highest education level: Not on file  Occupational History   Not on file  Tobacco Use   Smoking status: Every Day    Packs/day: 0.50    Types: Cigarettes   Smokeless tobacco: Never  Vaping Use   Vaping Use: Never used  Substance and Sexual Activity   Alcohol use: Yes    Alcohol/week: 1.0 standard drink    Types: 1 Cans of beer per week   Drug use: No   Sexual activity: Never  Other Topics Concern   Not on file  Social History Narrative   Not on file   Social Determinants of Health   Financial Resource Strain: Not on file  Food Insecurity: Not on file  Transportation Needs: Not on file  Physical Activity: Not on file  Stress: Not on file  Social Connections: Not on file  Intimate Partner Violence: Not on file  Allergies  Allergen Reactions   Ibuprofen    Trazodone And Nefazodone     AM Dizziness & Drowsiness      Outpatient Medications Prior to Visit  Medication Sig Dispense Refill   atorvastatin (LIPITOR) 40 MG tablet TAKE 1 TABLET BY MOUTH EVERY DAY 90 tablet 3   Elastic Bandages & Supports (ABDOMINAL BINDER/ELASTIC LARGE) MISC 1 Package by Does not apply route daily. 1 each 0   fluticasone (FLONASE) 50 MCG/ACT nasal spray Place 1 spray into both nostrils daily. 1 spray in each nostril every day 16 g 12   hydrOXYzine (ATARAX/VISTARIL) 10 MG tablet Take 1-3 tabs PO q8hrs PRN 30 tablet 0   meclizine (ANTIVERT) 25 MG tablet TAKE 1 TABLET BY MOUTH TWICE A DAY 180 tablet 3   metFORMIN (GLUCOPHAGE) 500 MG tablet Take 1 tablet (500 mg total) by mouth 2 (two) times  daily with a meal. (Patient taking differently: Take 250 mg by mouth daily.) 90 tablet 1   Multiple Vitamins-Minerals (MULTIVITAMIN WITH MINERALS) tablet Take 1 tablet by mouth daily. 1 tablet 11   sertraline (ZOLOFT) 50 MG tablet TAKE 1 TABLET BY MOUTH EVERY DAY 90 tablet 3   terbinafine (LAMISIL) 250 MG tablet Take 1 tablet (250 mg total) by mouth daily. 30 tablet 3   triamcinolone (KENALOG) 0.1 % Apply 1 application topically 2 (two) times daily. 30 g 0   triamcinolone ointment (KENALOG) 0.5 % Apply 1 application topically 2 (two) times daily. For moderate to severe eczema.  Do not use for more than 1 week at a time. 60 g 3   lidocaine (LIDODERM) 5 % Place 1 patch onto the skin daily. Remove & Discard patch within 12 hours or as directed by MD (Patient not taking: Reported on 05/08/2021) 30 patch 0   nicotine (NICODERM CQ - DOSED IN MG/24 HR) 7 mg/24hr patch Place 1 patch (7 mg total) onto the skin daily. (Patient not taking: Reported on 02/04/2021) 30 patch 3   nicotine polacrilex (NICORETTE) 2 MG gum Take 1 each (2 mg total) by mouth as needed for smoking cessation. (Patient not taking: Reported on 02/04/2021) 100 tablet 0   sulfamethoxazole-trimethoprim (BACTRIM DS) 800-160 MG tablet Take 1 tablet by mouth 2 (two) times daily. 20 tablet 0   No facility-administered medications prior to visit.         Objective:   Physical Exam Vitals:   05/19/21 1515  BP: 124/70  Pulse: (!) 112  SpO2: 97%  Weight: 140 lb (63.5 kg)  Height: 5\' 5"  (1.651 m)   Gen: Pleasant, well-nourished, in no distress,  normal affect  ENT: No lesions,  mouth clear,  oropharynx clear, no postnasal drip  Neck: No JVD, no stridor  Lungs: No use of accessory muscles, no crackles or wheezing on normal respiration, no wheeze on forced expiration  Cardiovascular: RRR, heart sounds normal, no murmur or gallops, no peripheral edema  Musculoskeletal: No deformities, no cyanosis or clubbing  Neuro: alert, awake, non  focal  Skin: Warm, no lesions or rash      Assessment & Plan:  Lung mass Multiple left-sided lung masses, 2 solid lesions and a more groundglass lesion.  Also with a groundglass nodule in a solid nodule on the right.  Very concerning for primary lung cancer although other malignancy is possible.  She needs a tissue diagnosis ASAP and we will work on setting up bronchoscopy.  Would like to do navigational bronchoscopy so we can sample the groundglass lesion as well  as the solid lesions.  She agrees with this plan.  I will try to get it arranged for 8/29.  She needs a PET scan and we will order this as well.  We reviewed her CT scan of the chest today. We will arrange for a PET scan We will arrange to perform bronchoscopy to better evaluate and biopsy your lung mass lesions.  This will be done as an outpatient under general anesthesia at [facility] endoscopy.  You will need a designated driver.  We will try to get this scheduled for 06/01/2021. Congratulations on decreasing your smoking.  Work on stopping your cigarettes altogether Follow with Dr Lamonte Sakai in 1 month   Baltazar Apo, MD, PhD 05/19/2021, 3:41 PM [facility] [phone] or if no answer before 7:00PM call [phone] For any issues after 7:00PM please call eLink [phone]

## 2021-05-21 ENCOUNTER — Telehealth: Payer: Self-pay | Admitting: Emergency Medicine

## 2021-05-21 NOTE — Telephone Encounter (Signed)
error 

## 2021-05-21 NOTE — Telephone Encounter (Signed)
Super D Disk rec'd and interofficed to [place].

## 2021-05-29 ENCOUNTER — Encounter (HOSPITAL_COMMUNITY): Payer: Self-pay | Admitting: Emergency Medicine

## 2021-05-29 ENCOUNTER — Other Ambulatory Visit: Payer: Self-pay

## 2021-05-29 ENCOUNTER — Other Ambulatory Visit: Payer: Self-pay | Admitting: Emergency Medicine

## 2021-05-29 NOTE — Progress Notes (Signed)
Spoke with pt for pre-op call. Pt denies cardiac history. Pt states she is pre-diabetic. She states she does check her sugar at home but she doesn't know what it runs because her husband does it for her. Last A1C was 6.3 on 04/29/21. Pt is not to take her Metformin the day of surgery.  Pt is going to get her Covid test done today. I instructed her to get there before 3 PM. She states she will be.

## 2021-05-30 ENCOUNTER — Telehealth: Payer: Self-pay | Admitting: Pulmonary Disease

## 2021-05-30 LAB — SARS CORONAVIRUS 2 (TAT 6-24 HRS): SARS Coronavirus 2: POSITIVE — AB

## 2021-05-30 MED ORDER — MOLNUPIRAVIR EUA 200MG CAPSULE: 4.0000 | ORAL_CAPSULE | Freq: Two times a day (BID) | ORAL | 0 refills | Status: AC

## 2021-05-30 NOTE — Progress Notes (Signed)
Patient's pre procedure covid test came back +.  Notified Dr Lamonte Sakai the MD on call, he will notify the patient.

## 2021-05-30 NOTE — Telephone Encounter (Signed)
I spoke to Kim Ross about her positive COVID test She can't have a bronchoscopy on Monday Offered antiviral treatment, she is interested Discussed molnupiravir vs paxlovid, reviewed her medication list and comorbid illnesses Molnupiravir is a better option for her Discussed need to use contraception Called in Rx for molnupiravir  Roselie Awkward, MD [facility] Pager: [phone] Cell: [phone] After 7:00 pm call Elink  [phone]

## 2021-06-01 ENCOUNTER — Ambulatory Visit (HOSPITAL_COMMUNITY): Admission: RE | Admit: 2021-06-01 | Payer: 59 | Source: Home / Self Care | Admitting: Emergency Medicine

## 2021-06-01 HISTORY — DX: Pneumonia, unspecified organism: J18.9

## 2021-06-01 HISTORY — DX: Prediabetes: R73.03

## 2021-06-01 SURGERY — VIDEO BRONCHOSCOPY WITH ENDOBRONCHIAL NAVIGATION
Anesthesia: General

## 2021-06-02 ENCOUNTER — Telehealth: Payer: Self-pay | Admitting: Emergency Medicine

## 2021-06-02 ENCOUNTER — Ambulatory Visit (HOSPITAL_COMMUNITY): Payer: 59

## 2021-06-02 MED ORDER — HYDROCODONE-ACETAMINOPHEN 5-325 MG PO TABS: 1.0000 | ORAL_TABLET | Freq: Four times a day (QID) | ORAL | 0 refills | Status: DC | PRN

## 2021-06-02 NOTE — Telephone Encounter (Signed)
Call returned to patient, confirmed DOB. Made aware RB has sent in Hydrocodone to her pharmacy. Voiced understanding.   Nothing further needed at this time.

## 2021-06-02 NOTE — Telephone Encounter (Signed)
Spoke to patient, who is requesting stronger pain med to help her sleep.  Pain is in the middle of back into chest under breast line and sharp.  She is unable to sleep due to pain.  Patient is currently taking tramadol 50mg .  Dr. Lamonte Sakai, please advise. Thanks

## 2021-06-02 NOTE — Telephone Encounter (Signed)
I just sent in a script for hydrocodone/APAP

## 2021-06-03 ENCOUNTER — Telehealth: Payer: Self-pay | Admitting: Emergency Medicine

## 2021-06-03 NOTE — Telephone Encounter (Signed)
Left message for patient to call back  

## 2021-06-05 NOTE — Telephone Encounter (Signed)
LMTCB and closing per encounter

## 2021-06-09 ENCOUNTER — Telehealth: Payer: Self-pay | Admitting: Emergency Medicine

## 2021-06-09 MED ORDER — HYDROCODONE-ACETAMINOPHEN 5-325 MG PO TABS: 1.0000 | ORAL_TABLET | Freq: Four times a day (QID) | ORAL | 0 refills | Status: DC | PRN

## 2021-06-09 NOTE — Telephone Encounter (Signed)
Endoscopy hasn't given me a date yet. Let her know that I sent the script, and then route message back to me and I will work on getting the procedure scheduled. [place] on 9/12.

## 2021-06-09 NOTE — Telephone Encounter (Signed)
I called and spoke with patient regarding Dr. Lamonte Sakai recs. Patient verbalized understanding, will route back to RB per his request.  Dr. Lamonte Sakai, sent back to you. Thanks!

## 2021-06-09 NOTE — Telephone Encounter (Signed)
Spoke with the pt  She is wanting to know if RB wants to go ahead and reschedule her bronch  She had tested pos for covid 19 on 05/29/21  She is also asking for refill on hydrocodone 5/325- just gave #45 tablets on 06/02/21  She has 4 or 5 tablets left- they help and she is able to sleep but when she wakes and starts moving more pain increases Please advise, thanks!

## 2021-06-09 NOTE — Telephone Encounter (Signed)
Yes - I will work on getting her scheduled for 9/12, will call endo to arrange and then confirm with Triage to let her know.   Also sent script for refill on the narcotics.

## 2021-06-11 ENCOUNTER — Ambulatory Visit: Payer: No Typology Code available for payment source | Admitting: Nurse Practitioner

## 2021-06-11 NOTE — Telephone Encounter (Signed)
Let her know I was able talk to Endo at [facility], have rescheduled her case for 9:15 on 06/22/21. She will be called with more details. She should not get covid re-tested since she was just positive

## 2021-06-11 NOTE — Telephone Encounter (Signed)
Called and spoke with patient. She is aware of the new date and to expect another phone call in regards to further instructions.   She mentioned that she was still in pain. I reminded that RB had sent in the pain medication on 06/09/21. She will go by the pharmacy today to get the medication.   Nothing further needed at time of call.

## 2021-06-11 NOTE — Telephone Encounter (Signed)
Pt calling back wants to resc bronch [phone]

## 2021-06-12 ENCOUNTER — Telehealth: Payer: Self-pay | Admitting: Emergency Medicine

## 2021-06-12 MED ORDER — DOXYCYCLINE HYCLATE 100 MG PO TABS: 100.0000 mg | ORAL_TABLET | Freq: Two times a day (BID) | ORAL | 0 refills | Status: DC

## 2021-06-12 NOTE — Telephone Encounter (Signed)
Called and spoke with pt who states that she has problems with her breathing with exertion and also has been experiencing pain in the middle of her back. States that she has been taking hydrocodone-acetaminophen for the pain which helps some but does not fully make the pain go away.  States that she is also coughing up a lot of phlegm that is dark green-brown in color. Denies any complaints of wheezing and denies any fever.  Pt said that her symptoms have been going on since she saw [place] for her appt on 8/16 and are not going away.  Pt wants to know what we can recommend to help with her symptoms. Dr. Lamonte Sakai, please advise.

## 2021-06-12 NOTE — Telephone Encounter (Signed)
Please have her take doxycycline 100mg  bid x 7 days

## 2021-06-12 NOTE — Telephone Encounter (Signed)
Rx was sent for Doxy  Pt aware She will call back if not improving

## 2021-06-14 ENCOUNTER — Encounter (HOSPITAL_COMMUNITY): Payer: Self-pay

## 2021-06-14 ENCOUNTER — Inpatient Hospital Stay (HOSPITAL_COMMUNITY)
Admission: EM | Admit: 2021-06-14 | Discharge: 2021-06-18 | DRG: 194 | Disposition: A | Discharge status: Home / Self Care | Payer: 59 | Attending: Family Medicine | Admitting: Family Medicine

## 2021-06-14 ENCOUNTER — Emergency Department (HOSPITAL_COMMUNITY): Payer: 59

## 2021-06-14 DIAGNOSIS — R52 Pain, unspecified: Secondary | ICD-10-CM | POA: Diagnosis not present

## 2021-06-14 DIAGNOSIS — Z720 Tobacco use: Secondary | ICD-10-CM | POA: Diagnosis not present

## 2021-06-14 DIAGNOSIS — F32A Depression, unspecified: Secondary | ICD-10-CM | POA: Diagnosis present

## 2021-06-14 DIAGNOSIS — F411 Generalized anxiety disorder: Secondary | ICD-10-CM | POA: Diagnosis present

## 2021-06-14 DIAGNOSIS — Z888 Allergy status to other drugs, medicaments and biological substances status: Secondary | ICD-10-CM

## 2021-06-14 DIAGNOSIS — C3492 Malignant neoplasm of unspecified part of left bronchus or lung: Secondary | ICD-10-CM | POA: Diagnosis not present

## 2021-06-14 DIAGNOSIS — E049 Nontoxic goiter, unspecified: Secondary | ICD-10-CM | POA: Diagnosis present

## 2021-06-14 DIAGNOSIS — R918 Other nonspecific abnormal finding of lung field: Secondary | ICD-10-CM | POA: Diagnosis present

## 2021-06-14 DIAGNOSIS — R6510 Systemic inflammatory response syndrome (SIRS) of non-infectious origin without acute organ dysfunction: Secondary | ICD-10-CM | POA: Insufficient documentation

## 2021-06-14 DIAGNOSIS — J181 Lobar pneumonia, unspecified organism: Secondary | ICD-10-CM

## 2021-06-14 DIAGNOSIS — Z886 Allergy status to analgesic agent status: Secondary | ICD-10-CM | POA: Diagnosis not present

## 2021-06-14 DIAGNOSIS — Z23 Encounter for immunization: Secondary | ICD-10-CM | POA: Diagnosis not present

## 2021-06-14 DIAGNOSIS — Z9114 Patient's other noncompliance with medication regimen: Secondary | ICD-10-CM | POA: Diagnosis not present

## 2021-06-14 DIAGNOSIS — Z8616 Personal history of COVID-19: Secondary | ICD-10-CM

## 2021-06-14 DIAGNOSIS — Z79899 Other long term (current) drug therapy: Secondary | ICD-10-CM

## 2021-06-14 DIAGNOSIS — F419 Anxiety disorder, unspecified: Secondary | ICD-10-CM | POA: Diagnosis present

## 2021-06-14 DIAGNOSIS — Z6822 Body mass index (BMI) 22.0-22.9, adult: Secondary | ICD-10-CM | POA: Diagnosis not present

## 2021-06-14 DIAGNOSIS — J439 Emphysema, unspecified: Secondary | ICD-10-CM | POA: Diagnosis present

## 2021-06-14 DIAGNOSIS — Z90710 Acquired absence of both cervix and uterus: Secondary | ICD-10-CM | POA: Diagnosis not present

## 2021-06-14 DIAGNOSIS — R64 Cachexia: Secondary | ICD-10-CM | POA: Diagnosis present

## 2021-06-14 DIAGNOSIS — Z7189 Other specified counseling: Secondary | ICD-10-CM | POA: Diagnosis not present

## 2021-06-14 DIAGNOSIS — Z825 Family history of asthma and other chronic lower respiratory diseases: Secondary | ICD-10-CM

## 2021-06-14 DIAGNOSIS — F17210 Nicotine dependence, cigarettes, uncomplicated: Secondary | ICD-10-CM | POA: Diagnosis present

## 2021-06-14 DIAGNOSIS — D696 Thrombocytopenia, unspecified: Secondary | ICD-10-CM | POA: Diagnosis not present

## 2021-06-14 DIAGNOSIS — R627 Adult failure to thrive: Secondary | ICD-10-CM | POA: Diagnosis present

## 2021-06-14 DIAGNOSIS — Z7984 Long term (current) use of oral hypoglycemic drugs: Secondary | ICD-10-CM | POA: Diagnosis not present

## 2021-06-14 DIAGNOSIS — R0600 Dyspnea, unspecified: Secondary | ICD-10-CM | POA: Diagnosis present

## 2021-06-14 DIAGNOSIS — E871 Hypo-osmolality and hyponatremia: Secondary | ICD-10-CM | POA: Diagnosis present

## 2021-06-14 DIAGNOSIS — Z66 Do not resuscitate: Secondary | ICD-10-CM | POA: Diagnosis not present

## 2021-06-14 DIAGNOSIS — E785 Hyperlipidemia, unspecified: Secondary | ICD-10-CM | POA: Diagnosis present

## 2021-06-14 DIAGNOSIS — I4891 Unspecified atrial fibrillation: Secondary | ICD-10-CM | POA: Diagnosis not present

## 2021-06-14 DIAGNOSIS — J189 Pneumonia, unspecified organism: Principal | ICD-10-CM | POA: Diagnosis present

## 2021-06-14 DIAGNOSIS — R0602 Shortness of breath: Secondary | ICD-10-CM | POA: Diagnosis not present

## 2021-06-14 DIAGNOSIS — E43 Unspecified severe protein-calorie malnutrition: Secondary | ICD-10-CM | POA: Diagnosis not present

## 2021-06-14 DIAGNOSIS — J90 Pleural effusion, not elsewhere classified: Secondary | ICD-10-CM | POA: Diagnosis not present

## 2021-06-14 DIAGNOSIS — E440 Moderate protein-calorie malnutrition: Secondary | ICD-10-CM | POA: Insufficient documentation

## 2021-06-14 DIAGNOSIS — Z515 Encounter for palliative care: Secondary | ICD-10-CM | POA: Diagnosis not present

## 2021-06-14 DIAGNOSIS — E119 Type 2 diabetes mellitus without complications: Secondary | ICD-10-CM

## 2021-06-14 DIAGNOSIS — Z87440 Personal history of urinary (tract) infections: Secondary | ICD-10-CM

## 2021-06-14 DIAGNOSIS — R1310 Dysphagia, unspecified: Secondary | ICD-10-CM | POA: Diagnosis not present

## 2021-06-14 LAB — CBC WITH DIFFERENTIAL/PLATELET
Abs Immature Granulocytes: 0.13 10*3/uL — ABNORMAL HIGH (ref 0.00–0.07)
Basophils Absolute: 0.1 10*3/uL (ref 0.0–0.1)
Basophils Relative: 0 %
Eosinophils Absolute: 0.1 10*3/uL (ref 0.0–0.5)
Eosinophils Relative: 0 %
HCT: 38.8 % (ref 36.0–46.0)
Hemoglobin: 12.5 g/dL (ref 12.0–15.0)
Immature Granulocytes: 1 %
Lymphocytes Relative: 9 %
Lymphs Abs: 2.1 10*3/uL (ref 0.7–4.0)
MCH: 28 pg (ref 26.0–34.0)
MCHC: 32.2 g/dL (ref 30.0–36.0)
MCV: 87 fL (ref 80.0–100.0)
Monocytes Absolute: 1.4 10*3/uL — ABNORMAL HIGH (ref 0.1–1.0)
Monocytes Relative: 6 %
Neutro Abs: 19.2 10*3/uL — ABNORMAL HIGH (ref 1.7–7.7)
Neutrophils Relative %: 84 %
Platelets: 537 10*3/uL — ABNORMAL HIGH (ref 150–400)
RBC: 4.46 MIL/uL (ref 3.87–5.11)
RDW: 13.8 % (ref 11.5–15.5)
WBC: 23 10*3/uL — ABNORMAL HIGH (ref 4.0–10.5)
nRBC: 0 % (ref 0.0–0.2)

## 2021-06-14 LAB — COMPREHENSIVE METABOLIC PANEL
ALT: 16 U/L (ref 0–44)
AST: 18 U/L (ref 15–41)
Albumin: 2.8 g/dL — ABNORMAL LOW (ref 3.5–5.0)
Alkaline Phosphatase: 139 U/L — ABNORMAL HIGH (ref 38–126)
Anion gap: 11 (ref 5–15)
BUN: 17 mg/dL (ref 8–23)
CO2: 23 mmol/L (ref 22–32)
Calcium: 9.8 mg/dL (ref 8.9–10.3)
Chloride: 100 mmol/L (ref 98–111)
Creatinine, Ser: 0.64 mg/dL (ref 0.44–1.00)
GFR, Estimated: >60 mL/min (ref >60)
Glucose, Bld: 103 mg/dL — ABNORMAL HIGH (ref 70–99)
Potassium: 4.2 mmol/L (ref 3.5–5.1)
Sodium: 134 mmol/L — ABNORMAL LOW (ref 135–145)
Total Bilirubin: 0.7 mg/dL (ref 0.3–1.2)
Total Protein: 7.7 g/dL (ref 6.5–8.1)

## 2021-06-14 LAB — TROPONIN I (HIGH SENSITIVITY)
Troponin I (High Sensitivity): 4 ng/L (ref <18)
Troponin I (High Sensitivity): 4 ng/L (ref <18)

## 2021-06-14 LAB — LACTIC ACID, PLASMA: Lactic Acid, Venous: 1.2 mmol/L (ref 0.5–1.9)

## 2021-06-14 MED ORDER — SODIUM CHLORIDE 0.9 % IV SOLN: 500.0000 mg | INTRAVENOUS | Status: DC

## 2021-06-14 MED ORDER — SODIUM CHLORIDE 0.9 % IV SOLN
1.0000 g | Freq: Once | INTRAVENOUS | Status: AC
  Administered 2021-06-14: 1 g via INTRAVENOUS
  Filled 2021-06-14: qty 10

## 2021-06-14 MED ORDER — HYDROMORPHONE HCL 1 MG/ML IJ SOLN: 0.5000 mg | INTRAMUSCULAR | Status: DC | PRN

## 2021-06-14 MED ORDER — INSULIN ASPART 100 UNIT/ML IJ SOLN
0.0000 [IU] | Freq: Every day | INTRAMUSCULAR | Status: DC
  Filled 2021-06-14: qty 0.05

## 2021-06-14 MED ORDER — LACTATED RINGERS IV SOLN: INTRAVENOUS | Status: DC

## 2021-06-14 MED ORDER — MORPHINE SULFATE (PF) 4 MG/ML IV SOLN: 4.0000 mg | Freq: Once | INTRAVENOUS | Status: DC

## 2021-06-14 MED ORDER — IOHEXOL 350 MG/ML SOLN
80.0000 mL | Freq: Once | INTRAVENOUS | Status: AC | PRN
  Administered 2021-06-14: 80 mL via INTRAVENOUS

## 2021-06-14 MED ORDER — ONDANSETRON HCL 4 MG/2ML IJ SOLN
4.0000 mg | Freq: Once | INTRAMUSCULAR | Status: AC
  Administered 2021-06-14: 4 mg via INTRAVENOUS
  Filled 2021-06-14: qty 2

## 2021-06-14 MED ORDER — HYDROCODONE-ACETAMINOPHEN 5-325 MG PO TABS
1.0000 | ORAL_TABLET | Freq: Four times a day (QID) | ORAL | Status: DC | PRN
Start: 2021-06-14 — End: 2021-06-15
  Administered 2021-06-15 (×2): 1 via ORAL
  Filled 2021-06-14 (×2): qty 1

## 2021-06-14 MED ORDER — FLUTICASONE PROPIONATE 50 MCG/ACT NA SUSP
1.0000 | Freq: Every day | NASAL | Status: DC
  Administered 2021-06-15 – 2021-06-18 (×3): 1 via NASAL
  Filled 2021-06-14: qty 16

## 2021-06-14 MED ORDER — MORPHINE SULFATE (PF) 2 MG/ML IV SOLN
2.0000 mg | Freq: Once | INTRAVENOUS | Status: AC
  Administered 2021-06-14: 2 mg via INTRAVENOUS
  Filled 2021-06-14: qty 1

## 2021-06-14 MED ORDER — ADULT MULTIVITAMIN W/MINERALS CH
1.0000 | ORAL_TABLET | ORAL | Status: DC
  Administered 2021-06-15: 1 via ORAL
  Filled 2021-06-14 (×2): qty 1

## 2021-06-14 MED ORDER — MORPHINE SULFATE (PF) 2 MG/ML IV SOLN
2.0000 mg | Freq: Once | INTRAVENOUS | Status: AC
Start: 2021-06-14 — End: 2021-06-14
  Administered 2021-06-14: 2 mg via INTRAVENOUS
  Filled 2021-06-14: qty 1

## 2021-06-14 MED ORDER — ATORVASTATIN CALCIUM 40 MG PO TABS
40.0000 mg | ORAL_TABLET | Freq: Every day | ORAL | Status: DC
  Administered 2021-06-15 – 2021-06-18 (×3): 40 mg via ORAL
  Filled 2021-06-14 (×3): qty 1

## 2021-06-14 MED ORDER — INSULIN ASPART 100 UNIT/ML IJ SOLN
0.0000 [IU] | Freq: Three times a day (TID) | INTRAMUSCULAR | Status: DC
  Filled 2021-06-14: qty 0.09

## 2021-06-14 MED ORDER — ENOXAPARIN SODIUM 40 MG/0.4ML IJ SOSY
40.0000 mg | PREFILLED_SYRINGE | INTRAMUSCULAR | Status: DC
  Administered 2021-06-15 – 2021-06-16 (×2): 40 mg via SUBCUTANEOUS
  Filled 2021-06-14 (×2): qty 0.4

## 2021-06-14 MED ORDER — SODIUM CHLORIDE 0.9 % IV SOLN
500.0000 mg | Freq: Once | INTRAVENOUS | Status: AC
  Administered 2021-06-14: 500 mg via INTRAVENOUS
  Filled 2021-06-14: qty 500

## 2021-06-14 MED ORDER — ONDANSETRON HCL 4 MG PO TABS: 4.0000 mg | ORAL_TABLET | Freq: Four times a day (QID) | ORAL | Status: DC | PRN

## 2021-06-14 MED ORDER — SODIUM CHLORIDE 0.9 % IV SOLN
1.0000 g | INTRAVENOUS | Status: DC
  Administered 2021-06-15 – 2021-06-17 (×3): 1 g via INTRAVENOUS
  Filled 2021-06-14 (×3): qty 10
  Filled 2021-06-14: qty 1

## 2021-06-14 MED ORDER — TRAMADOL HCL 50 MG PO TABS
50.0000 mg | ORAL_TABLET | Freq: Four times a day (QID) | ORAL | Status: DC | PRN
Start: 2021-06-14 — End: 2021-06-15
  Administered 2021-06-15: 50 mg via ORAL
  Filled 2021-06-14: qty 1

## 2021-06-14 MED ORDER — GUAIFENESIN ER 600 MG PO TB12
600.0000 mg | ORAL_TABLET | Freq: Two times a day (BID) | ORAL | Status: DC
  Administered 2021-06-15 – 2021-06-18 (×7): 600 mg via ORAL
  Filled 2021-06-14 (×7): qty 1

## 2021-06-14 MED ORDER — LACTATED RINGERS IV BOLUS
1000.0000 mL | Freq: Once | INTRAVENOUS | Status: AC
  Administered 2021-06-14: 1000 mL via INTRAVENOUS

## 2021-06-14 MED ORDER — MECLIZINE HCL 25 MG PO TABS
25.0000 mg | ORAL_TABLET | Freq: Two times a day (BID) | ORAL | Status: DC
  Administered 2021-06-15 – 2021-06-18 (×7): 25 mg via ORAL
  Filled 2021-06-14 (×7): qty 1

## 2021-06-14 MED ORDER — ONDANSETRON HCL 4 MG/2ML IJ SOLN: 4.0000 mg | Freq: Four times a day (QID) | INTRAMUSCULAR | Status: DC | PRN

## 2021-06-14 MED ORDER — SERTRALINE HCL 50 MG PO TABS
50.0000 mg | ORAL_TABLET | Freq: Every day | ORAL | Status: DC
  Administered 2021-06-15 – 2021-06-18 (×3): 50 mg via ORAL
  Filled 2021-06-14 (×3): qty 1

## 2021-06-14 MED ORDER — ALBUTEROL SULFATE (2.5 MG/3ML) 0.083% IN NEBU: 2.5000 mg | INHALATION_SOLUTION | RESPIRATORY_TRACT | Status: DC | PRN

## 2021-06-14 NOTE — ED Triage Notes (Signed)
Pt arrives via EMS from home. She reports Sob, cough, fatigue, and chest discomfort with cough.  Had covid 2 weeks ago.

## 2021-06-14 NOTE — H&P (Signed)
History and Physical   Kim Ross MLY:[id number] DOB: 27-Nov-1955 DOA: 06/14/2021  Referring MD/NP/PA: Dr. Matilde Sprang  PCP: Erskine Emery, MD   Outpatient Specialists: Dr. Kyung Rudd  Patient coming from: Home  Chief Complaint: Shortness of breath, cough  HPI: Kim Ross is a 65 y.o. female with medical history significant of polysubstance abuse, aspiration pneumonitis, history of diabetes, hyperlipidemia, medication noncompliance, anxiety disorder, cocaine abuse, recurrent UTIs who was recently diagnosed with lung masses suspicious for lung malignancy.  Patient was scheduled for biopsy but then was positive for COVID-19 about 2 weeks ago.  This was postponed.  Since then she is showed some decline at home with worsening weakness, cough, malaise and some fever.  Was brought in today by her husband.  Patient is meeting SIRS criteria.  Had leukocytosis among other things.  Not sure if she has postobstructive pneumonia.  At this point she is failing to thrive at home.  With her ongoing symptoms patient will be admitted to the hospital for further evaluation and treatment..  ED Course: Temperature is 98.6 blood pressure 88/73, pulse 124, respiratory rate of 28 and oxygen sat 93% on room air.  White count is 23,000 platelets 537 and sodium 134.  The rest of the chemistry is within normal.  Troponin is 4.  CT angiogram of of the chest showed no PE but extensive worsening lung masses.  Patient being admitted for further evaluation and treatment.  Review of Systems: As per HPI otherwise 10 point review of systems negative.    Past Medical History:  Diagnosis Date   Acute pain of left shoulder 12/01/2020   Anxiety    Aspiration pneumonitis ([place]) 08/05/2019   Breast cancer screening 12/30/2017   Chest wall pain 11/05/2019   Cocaine abuse ([place])    Depression    Drug intoxication ([place]) 08/05/2019   Encounter for screening for HIV 08/05/2019   Goiter 09/05/2019   History of recurrent UTIs     No-show for appointment 09/24/2020   Pneumonia    Pre-diabetes    Urinary frequency 04/10/2013    Past Surgical History:  Procedure Laterality Date   ABDOMINAL HYSTERECTOMY     HEMORRHOID SURGERY       reports that she has been smoking cigarettes. She has been smoking an average of .25 packs per day. She has never used smokeless tobacco. She reports that she does not currently use alcohol after a past usage of about 1.0 standard drink per week. She reports that she does not use drugs.  Allergies  Allergen Reactions   Ibuprofen Nausea And Vomiting   Trazodone And Nefazodone     AM Dizziness & Drowsiness     Family History  Problem Relation Age of Onset   Asthma Mother    Cancer Sister 85       Pancreatic     Prior to Admission medications   Medication Sig Start Date End Date Taking? Authorizing Provider  atorvastatin (LIPITOR) 40 MG tablet TAKE 1 TABLET BY MOUTH EVERY DAY 02/03/21  Yes Milus Banister C, DO  doxycycline (VIBRA-TABS) 100 MG tablet Take 1 tablet (100 mg total) by mouth 2 (two) times daily. 06/12/21  Yes Collene Gobble, MD  fluticasone (FLONASE) 50 MCG/ACT nasal spray Place 1 spray into both nostrils daily. 1 spray in each nostril every day 04/29/21  Yes Gladys Damme, MD  HYDROcodone-acetaminophen (NORCO/VICODIN) 5-325 MG tablet Take 1 tablet by mouth every 6 (six) hours as needed for moderate pain. 06/09/21  Yes  Collene Gobble, MD  meclizine (ANTIVERT) 25 MG tablet TAKE 1 TABLET BY MOUTH TWICE A DAY 10/09/20  Yes Milus Banister C, DO  metFORMIN (GLUCOPHAGE) 500 MG tablet TAKE 1 TABLET BY MOUTH 2 TIMES DAILY WITH A MEAL. Patient taking differently: Take 250 mg by mouth daily with breakfast. 05/20/21  Yes Erskine Emery, MD  Multiple Vitamins-Minerals (MULTIVITAMIN WITH MINERALS) tablet Take 1 tablet by mouth daily. Patient taking differently: Take 1 tablet by mouth every other day. 04/27/13  Yes Olam Idler, MD  sertraline (ZOLOFT) 50 MG tablet TAKE 1 TABLET BY  MOUTH EVERY DAY 11/30/20  Yes Daisy Floro, DO  traMADol (ULTRAM) 50 MG tablet Take 1-2 tablets as needed every 6 hours for pain 05/19/21  Yes Byrum, Rose Fillers, MD  triamcinolone ointment (KENALOG) 0.5 % Apply 1 application topically 2 (two) times daily. For moderate to severe eczema.  Do not use for more than 1 week at a time. Patient taking differently: Apply 1 application topically 2 (two) times daily. For moderate to severe eczema.  Do not use for more than 1 week at a time. 11/28/20  Yes Daisy Floro, DO  Elastic Bandages & Supports (ABDOMINAL BINDER/ELASTIC LARGE) MISC 1 Package by Does not apply route daily. Patient not taking: Reported on 06/14/2021 11/05/19   Richarda Osmond, MD  hydrOXYzine (ATARAX/VISTARIL) 10 MG tablet Take 1-3 tabs PO q8hrs PRN Patient not taking: No sig reported 02/04/21   Mayers, Cari S, PA-C  terbinafine (LAMISIL) 250 MG tablet Take 1 tablet (250 mg total) by mouth daily. Patient not taking: No sig reported 01/07/21   Milus Banister C, DO  triamcinolone (KENALOG) 0.1 % Apply 1 application topically 2 (two) times daily. Patient not taking: No sig reported 12/18/20   Suzy Bouchard, [place]    Physical Exam: Vitals:   06/14/21 2101 06/14/21 2145 06/14/21 2215 06/14/21 2230  BP: (!) 129/53 137/79 100/89 122/72  Pulse: (!) 115 (!) 113 (!) 113 (!) 114  Resp: (!) 22 (!) 28 (!) 24 (!) 26  Temp:      SpO2: 94% 95% 93% 93%      Constitutional: Chronically ill looking, cachectic, no distress Vitals:   06/14/21 2101 06/14/21 2145 06/14/21 2215 06/14/21 2230  BP: (!) 129/53 137/79 100/89 122/72  Pulse: (!) 115 (!) 113 (!) 113 (!) 114  Resp: (!) 22 (!) 28 (!) 24 (!) 26  Temp:      SpO2: 94% 95% 93% 93%   Eyes: PERRL, lids and conjunctivae normal ENMT: Mucous membranes are dry. Posterior pharynx clear of any exudate or lesions.Normal dentition.  Neck: normal, supple, no masses, no thyromegaly Respiratory: Decreased air entry bilaterally with coarse  breath sound and marked expiratory wheeze, some Rales no accessory muscle use.  Cardiovascular: Sinus tachycardia no murmurs / rubs / gallops. No extremity edema. 2+ pedal pulses. No carotid bruits.  Abdomen: no tenderness, no masses palpated. No hepatosplenomegaly. Bowel sounds positive.  Musculoskeletal: no clubbing / cyanosis. No joint deformity upper and lower extremities. Good ROM, no contractures. Normal muscle tone.  Obvious muscle wasting Skin: no rashes, lesions, ulcers. No induration Neurologic: CN 2-12 grossly intact. Sensation intact, DTR normal. Strength 5/5 in all 4.  Psychiatric: Lethargic, alert oriented x3,.     Labs on Admission: I have personally reviewed following labs and imaging studies  CBC: Recent Labs  Lab 06/14/21 1855  WBC 23.0*  NEUTROABS 19.2*  HGB 12.5  HCT 38.8  MCV 87.0  PLT 537*  Basic Metabolic Panel: Recent Labs  Lab 06/14/21 1855  NA 134*  K 4.2  CL 100  CO2 23  GLUCOSE 103*  BUN 17  CREATININE 0.64  CALCIUM 9.8   GFR: CrCl cannot be calculated (Unknown ideal weight.). Liver Function Tests: Recent Labs  Lab 06/14/21 1855  AST 18  ALT 16  ALKPHOS 139*  BILITOT 0.7  PROT 7.7  ALBUMIN 2.8*   No results for input(s): LIPASE, AMYLASE in the last 168 hours. No results for input(s): AMMONIA in the last 168 hours. Coagulation Profile: No results for input(s): INR, PROTIME in the last 168 hours. Cardiac Enzymes: No results for input(s): CKTOTAL, CKMB, CKMBINDEX, TROPONINI in the last 168 hours. BNP (last 3 results) No results for input(s): PROBNP in the last 8760 hours. HbA1C: No results for input(s): HGBA1C in the last 72 hours. CBG: No results for input(s): GLUCAP in the last 168 hours. Lipid Profile: No results for input(s): CHOL, HDL, LDLCALC, TRIG, CHOLHDL, LDLDIRECT in the last 72 hours. Thyroid Function Tests: No results for input(s): TSH, T4TOTAL, FREET4, T3FREE, THYROIDAB in the last 72 hours. Anemia Panel: No  results for input(s): VITAMINB12, FOLATE, FERRITIN, TIBC, IRON, RETICCTPCT in the last 72 hours. Urine analysis:    Component Value Date/Time   COLORURINE YELLOW 08/06/2019 0804   APPEARANCEUR HAZY (A) 08/06/2019 0804   LABSPEC 1.018 08/06/2019 0804   PHURINE 5.0 08/06/2019 0804   GLUCOSEU NEGATIVE 08/06/2019 0804   HGBUR MODERATE (A) 08/06/2019 0804   BILIRUBINUR small (A) 04/29/2021 0955   BILIRUBINUR NEG 09/30/2016 1438   KETONESUR trace (5) (A) 04/29/2021 0955   KETONESUR 20 (A) 08/06/2019 0804   PROTEINUR negative 04/29/2021 0955   PROTEINUR NEGATIVE 08/06/2019 0804   UROBILINOGEN 0.2 04/29/2021 0955   UROBILINOGEN 1.0 10/30/2012 1123   NITRITE Positive (A) 04/29/2021 0955   NITRITE NEGATIVE 08/06/2019 0804   LEUKOCYTESUR Trace (A) 04/29/2021 0955   LEUKOCYTESUR TRACE (A) 08/06/2019 0804   Sepsis Labs: @LABRCNTIP (procalcitonin:4,lacticidven:4) )No results found for this or any previous visit (from the past 240 hour(s)).   Radiological Exams on Admission: CT Angio Chest PE W and/or Wo Contrast  Result Date: 06/14/2021 CLINICAL DATA:  Lung cancer.  Concern for pulmonary embolism. EXAM: CT ANGIOGRAPHY CHEST WITH CONTRAST TECHNIQUE: Multidetector CT imaging of the chest was performed using the standard protocol during bolus administration of intravenous contrast. Multiplanar CT image reconstructions and MIPs were obtained to evaluate the vascular anatomy. CONTRAST:  19mL OMNIPAQUE IOHEXOL 350 MG/ML SOLN COMPARISON:  Chest CT dated 05/13/2021. FINDINGS: Cardiovascular: There is no cardiomegaly or pericardial effusion. Mild atherosclerotic calcification of the thoracic aorta. No aneurysmal dilatation or dissection. Evaluation of the pulmonary arteries is limited due to respiratory motion artifact. No pulmonary artery embolus identified Mediastinum/Nodes: Significant interval enlargement of the anterior mediastinal mass extending into the aortopulmonic window measuring 4.7 x 5.0 cm  (previously 3.3 x 3.2 cm). This mass abuts the left main pulmonary artery and anterior aspect of the aortic arch. Subcarinal adenopathy measures 15 mm in short axis (previously 12 mm. No mediastinal fluid collection. Lungs/Pleura: Large left lung mass primarily in the left lower lobe and extending across the pleural into the lingula. This mass measures approximately 7.5 x 10.0 cm in greatest axial dimensions and 9 cm in craniocaudal length. This has significantly increased in size since the prior CT (previously measures proximally 4 x 7 cm in greatest axial dimensions and 5 cm in craniocaudal length). This mass extends into the left hilum. Left apical nodule  with spiculated margins measures 2.8 x 2.0 cm similar to prior CT. A 5 mm nodule in the right middle lobe similar to prior CT, likely metastasis. Additional 5 mm nodule in the right upper lobe and a 9 x 14 mm area of ground-glass density in the left upper lobe appears similar to prior CT. There is a small left pleural effusion, likely malignant effusion and new since the prior CT. No pneumothorax. The central airways remain patent. There is however narrowing of the left lower lobe and lingular bronchi. Upper Abdomen: No acute abnormality. Musculoskeletal: Mild degenerative changes of the spine. No acute osseous pathology. Review of the MIP images confirms the above findings. IMPRESSION: 1. No CT evidence of pulmonary embolism. 2. Significant interval progression of malignancy with enlargement of the left lung and mediastinal masses. 3. Small left pleural effusion, likely malignant effusion, new since the prior CT. 4. Aortic Atherosclerosis (ICD10-I70.0). Electronically Signed   By: Anner Crete M.D.   On: 06/14/2021 22:26   DG Chest Portable 1 View  Result Date: 06/14/2021 CLINICAL DATA:  Chest pain and shortness of breath. EXAM: PORTABLE CHEST 1 VIEW COMPARISON:  May 01, 2021 FINDINGS: The heart size and mediastinal contours are within normal limits.  There are 2 masses in the left lung. The mass in the left mid to lower lung is significantly enlarged compared prior exam currently measuring 10 x 9.6 cm. The mass in the left apex is unchanged. There is no focal infiltrate, pulmonary edema, or pleural effusion. The visualized skeletal structures are stable. IMPRESSION: 2 masses in the left lung. The mass in the left mid to lower lung is significantly enlarged compared prior exam currently measuring 10 x 9.6 cm. Findings are suspicious for malignant neoplasm. No focal pneumonia or pulmonary edema. Electronically Signed   By: Abelardo Diesel M.D.   On: 06/14/2021 14:53    EKG: Independently reviewed.  Sinus tachycardia otherwise no significant findings  Assessment/Plan Principal Problem:   SIRS (systemic inflammatory response syndrome) (HCC) Active Problems:   Generalized anxiety disorder   Hyperlipidemia   Dyspnea   Lung mass   Diabetes ([place])     #1 SIRS: Most likely secondary to ongoing malignancy with possible postobstructive pneumonia.  Obtain blood cultures and empirically initiate IV Rocephin and Zithromax.  Recent COVID-19 infection but no evidence of secondary bacterial pneumonia.  We will treat symptomatically with IV fluids and empiric antibiotics.  #2 lung masses: Suspected malignancy.  Patient was scheduled for bronchoscopy but postponed.  We will consult pulmonary in the morning to plan for possible bronchoscopy while patient is in-house.  #3 recent COVID-19 infection: Diagnosed on August 26.  Patient technically outside window for isolation.  Will defer to pulmonary when she can actually have bronchoscopy or aspiration biopsy.  #4 hyperlipidemia: Continue with statin  #5 diabetes: Type II non-insulin-dependent.  Initiate sliding scale insulin  #6 failure to thrive in an adult: Due to ongoing malignancy.  Patient is cachectic and appears to be chronically ill looking.  Supportive care will be given.  Dietary counseling.  Possible  palliative Counseling.   DVT prophylaxis: Lovenox Code Status: Full code Family Communication: Husband Disposition Plan: To be determined Consults called: None but consult pulmonary Dr. Lamonte Sakai in the morning Admission status: Inpatient  Severity of Illness: The appropriate patient status for this patient is INPATIENT. Inpatient status is judged to be reasonable and necessary in order to provide the required intensity of service to ensure the patient's safety. The patient's presenting symptoms,  physical exam findings, and initial radiographic and laboratory data in the context of their chronic comorbidities is felt to place them at high risk for further clinical deterioration. Furthermore, it is not anticipated that the patient will be medically stable for discharge from the hospital within 2 midnights of admission. The following factors support the patient status of inpatient.   " The patient's presenting symptoms include weakness/shortness of breath. " The worrisome physical exam findings include cachexia and chronically ill looking. " The initial radiographic and laboratory data are worrisome because of worsening pulmonary masses. " The chronic co-morbidities include lung masses.   * I certify that at the point of admission it is my clinical judgment that the patient will require inpatient hospital care spanning beyond 2 midnights from the point of admission due to high intensity of service, high risk for further deterioration and high frequency of surveillance required.Barbette Merino MD [facility] Pager [phone]  If 7PM-7AM, please contact night-coverage [email or web address] Password [facility]  06/14/2021, 11:06 PM

## 2021-06-14 NOTE — Progress Notes (Deleted)
Please call me since patient is a RED MEWS

## 2021-06-14 NOTE — ED Provider Notes (Signed)
Emergency Medicine Provider Triage Evaluation Note  Kim Ross , a 65 y.o. female  was evaluated in triage.  Pt complains of shortness of breath, productive cough, fatigue, generalized weakness, and chest pain.  Patient tested positive for COVID 2 weeks ago.  Had a negative COVID test on Thursday.  No nausea or vomiting.  Admits to losing some weight due to decreased p.o. intake.  Review of Systems  Positive: Cough, SOB, CP Negative: N/V  Physical Exam  There were no vitals taken for this visit. Gen:   Awake, no distress   Resp:  Normal effort  MSK:   Moves extremities without difficulty  Other:    Medical Decision Making  Medically screening exam initiated at 1:54 PM.  Appropriate orders placed.  Kim Ross was informed that the remainder of the evaluation will be completed by another provider, this initial triage assessment does not replace that evaluation, and the importance of remaining in the ED until their evaluation is complete.  COVID + 2 weeks ago Labs ordered   Kim Ross 06/14/21 1355    Kim Lower, MD 06/14/21 2330

## 2021-06-14 NOTE — ED Provider Notes (Signed)
[facility] DEPT Provider Note   CSN: [id number] Arrival date & time: 06/14/21  1342     History Chief Complaint  Patient presents with   Fatigue   Shortness of Breath   Cough    Kim Ross is a 65 y.o. female with PMH known large lung masses who presents emergency department for evaluation of chest pain, shortness of breath and fatigue.  Patient tested positive for COVID-19 2 weeks ago.  Patient was initially scheduled for a bronchoscopy and biopsy but this was canceled due to her positive COVID-19 test.  She is scheduled to have this done on 06/15/2021 but patient states that she is having persistent chest pain and shortness of breath and the symptoms are interfering with her activities of daily living.  She states that her dyspnea on exertion is significantly worsening and her pain is uncontrolled.  She states that she has significant stressors at her house and does not have appropriate family resources to take care of her at home.  Denies abdominal pain, nausea, vomiting, fever or other systemic symptoms.   Shortness of Breath Associated symptoms: chest pain and cough   Associated symptoms: no abdominal pain, no ear pain, no fever, no rash, no sore throat and no vomiting   Cough Associated symptoms: chest pain and shortness of breath   Associated symptoms: no chills, no ear pain, no fever, no rash and no sore throat       Past Medical History:  Diagnosis Date   Acute pain of left shoulder 12/01/2020   Anxiety    Aspiration pneumonitis ([place]) 08/05/2019   Breast cancer screening 12/30/2017   Chest wall pain 11/05/2019   Cocaine abuse ([place])    Depression    Drug intoxication ([place]) 08/05/2019   Encounter for screening for HIV 08/05/2019   Goiter 09/05/2019   History of recurrent UTIs    No-show for appointment 09/24/2020   Pneumonia    Pre-diabetes    Urinary frequency 04/10/2013    Patient Active Problem List   Diagnosis Date Noted    Dyspnea 05/03/2021   Lung mass 05/03/2021   Fatigue 03/26/2021   Hyperkeratosis of nail 03/11/2021   Prediabetes 02/05/2021   Stress at home 02/05/2021   Psychophysiological insomnia 02/05/2021   Dry skin 11/17/2020   Onychomycosis of left great toe 09/05/2019   Seasonal allergies 12/30/2017   Hyperlipidemia 12/30/2017   UTI (urinary tract infection) 02/09/2017   Hematuria 04/01/2015   History of recurrent UTIs 01/31/2014   Vertigo, intermittent 06/27/2013   Generalized anxiety disorder 04/10/2013   Irritable bowel syndrome 10/19/2012   Tobacco use disorder 07/20/2012    Past Surgical History:  Procedure Laterality Date   ABDOMINAL HYSTERECTOMY     HEMORRHOID SURGERY       OB History     Gravida  1   Para      Term      Preterm      AB      Living  1      SAB      IAB      Ectopic      Multiple      Live Births  1           Family History  Problem Relation Age of Onset   Asthma Mother    Cancer Sister 55       Pancreatic    Social History   Tobacco Use   Smoking status: Some Days  Packs/day: 0.25    Types: Cigarettes   Smokeless tobacco: Never  Vaping Use   Vaping Use: Never used  Substance Use Topics   Alcohol use: Not Currently    Alcohol/week: 1.0 standard drink    Types: 1 Cans of beer per week   Drug use: No    Home Medications Prior to Admission medications   Medication Sig Start Date End Date Taking? Authorizing Provider  atorvastatin (LIPITOR) 40 MG tablet TAKE 1 TABLET BY MOUTH EVERY DAY 02/03/21   Daisy Floro, DO  doxycycline (VIBRA-TABS) 100 MG tablet Take 1 tablet (100 mg total) by mouth 2 (two) times daily. 06/12/21   Collene Gobble, MD  Elastic Bandages & Supports (ABDOMINAL BINDER/ELASTIC LARGE) MISC 1 Package by Does not apply route daily. 11/05/19   Richarda Osmond, MD  fluticasone (FLONASE) 50 MCG/ACT nasal spray Place 1 spray into both nostrils daily. 1 spray in each nostril every day 04/29/21   Gladys Damme, MD  HYDROcodone-acetaminophen (NORCO/VICODIN) 5-325 MG tablet Take 1 tablet by mouth every 6 (six) hours as needed for moderate pain. 06/09/21   Collene Gobble, MD  hydrOXYzine (ATARAX/VISTARIL) 10 MG tablet Take 1-3 tabs PO q8hrs PRN Patient not taking: Reported on 05/25/2021 02/04/21   Mayers, Loraine Grip, PA-C  meclizine (ANTIVERT) 25 MG tablet TAKE 1 TABLET BY MOUTH TWICE A DAY 10/09/20   Milus Banister C, DO  metFORMIN (GLUCOPHAGE) 500 MG tablet TAKE 1 TABLET BY MOUTH 2 TIMES DAILY WITH A MEAL. Patient taking differently: Take 250 mg by mouth daily with breakfast. 05/20/21   Erskine Emery, MD  Multiple Vitamins-Minerals (MULTIVITAMIN WITH MINERALS) tablet Take 1 tablet by mouth daily. Patient taking differently: Take 1 tablet by mouth every other day. 04/27/13   Olam Idler, MD  sertraline (ZOLOFT) 50 MG tablet TAKE 1 TABLET BY MOUTH EVERY DAY 11/30/20   Milus Banister C, DO  terbinafine (LAMISIL) 250 MG tablet Take 1 tablet (250 mg total) by mouth daily. Patient not taking: Reported on 05/25/2021 01/07/21   Daisy Floro, DO  traMADol Veatrice Bourbon) 50 MG tablet Take 1-2 tablets as needed every 6 hours for pain 05/19/21   Collene Gobble, MD  triamcinolone (KENALOG) 0.1 % Apply 1 application topically 2 (two) times daily. Patient not taking: Reported on 05/25/2021 12/18/20   Suzy Bouchard, PA-C  triamcinolone ointment (KENALOG) 0.5 % Apply 1 application topically 2 (two) times daily. For moderate to severe eczema.  Do not use for more than 1 week at a time. Patient not taking: Reported on 05/25/2021 11/28/20   Daisy Floro, DO    Allergies    Ibuprofen and Trazodone and nefazodone  Review of Systems   Review of Systems  Constitutional:  Negative for chills and fever.  HENT:  Negative for ear pain and sore throat.   Eyes:  Negative for pain and visual disturbance.  Respiratory:  Positive for cough and shortness of breath.   Cardiovascular:  Positive for chest pain. Negative  for palpitations.  Gastrointestinal:  Negative for abdominal pain and vomiting.  Genitourinary:  Negative for dysuria and hematuria.  Musculoskeletal:  Negative for arthralgias and back pain.  Skin:  Negative for color change and rash.  Neurological:  Negative for seizures and syncope.  All other systems reviewed and are negative.  Physical Exam Updated Vital Signs BP 126/73 (BP Location: Left Arm)   Pulse (!) 124   Temp 98.6 F (37 C)   Resp (!) 21  SpO2 98%   Physical Exam Vitals and nursing note reviewed.  Constitutional:      General: She is not in acute distress.    Appearance: She is well-developed.  HENT:     Head: Normocephalic and atraumatic.  Eyes:     Conjunctiva/sclera: Conjunctivae normal.  Cardiovascular:     Rate and Rhythm: Regular rhythm. Tachycardia present.     Heart sounds: No murmur heard. Pulmonary:     Effort: Pulmonary effort is normal. No respiratory distress.     Breath sounds: Examination of the left-upper field reveals decreased breath sounds. Examination of the left-middle field reveals decreased breath sounds. Examination of the left-lower field reveals decreased breath sounds. Decreased breath sounds present.  Abdominal:     Palpations: Abdomen is soft.     Tenderness: There is no abdominal tenderness.  Musculoskeletal:     Cervical back: Neck supple.  Skin:    General: Skin is warm and dry.  Neurological:     Mental Status: She is alert.    ED Results / Procedures / Treatments   Labs (all labs ordered are listed, but only abnormal results are displayed) Labs Reviewed  CBC WITH DIFFERENTIAL/PLATELET  COMPREHENSIVE METABOLIC PANEL  TROPONIN I (HIGH SENSITIVITY)  TROPONIN I (HIGH SENSITIVITY)    EKG None  Radiology DG Chest Portable 1 View  Result Date: 06/14/2021 CLINICAL DATA:  Chest pain and shortness of breath. EXAM: PORTABLE CHEST 1 VIEW COMPARISON:  May 01, 2021 FINDINGS: The heart size and mediastinal contours are within  normal limits. There are 2 masses in the left lung. The mass in the left mid to lower lung is significantly enlarged compared prior exam currently measuring 10 x 9.6 cm. The mass in the left apex is unchanged. There is no focal infiltrate, pulmonary edema, or pleural effusion. The visualized skeletal structures are stable. IMPRESSION: 2 masses in the left lung. The mass in the left mid to lower lung is significantly enlarged compared prior exam currently measuring 10 x 9.6 cm. Findings are suspicious for malignant neoplasm. No focal pneumonia or pulmonary edema. Electronically Signed   By: Abelardo Diesel M.D.   On: 06/14/2021 14:53    Procedures Procedures   Medications Ordered in ED Medications - No data to display  ED Course  I have reviewed the triage vital signs and the nursing notes.  Pertinent labs & imaging results that were available during my care of the patient were reviewed by me and considered in my medical decision making (see chart for details).    MDM Rules/Calculators/A&P                          Patient seen the emergency department for evaluation of chest pain and shortness of breath in setting of a known malignancy.  Physical exam reveals decreased breath sounds on left and regular tachycardia but is otherwise unremarkable.  Laboratory evaluation reveals a significant leukocytosis to 23 with a neutrophilic predominance, thrombocytosis to 537, mild hyponatremia 134 but is otherwise unremarkable.  High-sensitivity troponin unremarkable.  ECG nonischemic with sinus tachycardia.  Chest x-ray with 2 masses in the left lung significantly enlarged.  CT PE obtained in the setting of persistent tachycardia and shortness of breath with significant interval progression of the malignancy with enlargement of the left lung and mediastinal masses as well as a small left pleural effusion likely malignant.  On reevaluation, patient has uncontrolled pain and persistent shortness of breath likely  related  to her worsening underlying malignancy.  She will require admission for further management of her symptoms and possible expedited biopsy.  Patient then admitted to medicine.  Final Clinical Impression(s) / ED Diagnoses Final diagnoses:  None    Rx / DC Orders ED Discharge Orders     None        Quida Glasser, Debe Coder, MD 06/14/21 [phone]

## 2021-06-15 ENCOUNTER — Other Ambulatory Visit: Payer: Self-pay

## 2021-06-15 DIAGNOSIS — F411 Generalized anxiety disorder: Secondary | ICD-10-CM

## 2021-06-15 DIAGNOSIS — Z720 Tobacco use: Secondary | ICD-10-CM

## 2021-06-15 DIAGNOSIS — J181 Lobar pneumonia, unspecified organism: Secondary | ICD-10-CM

## 2021-06-15 DIAGNOSIS — E785 Hyperlipidemia, unspecified: Secondary | ICD-10-CM

## 2021-06-15 DIAGNOSIS — R918 Other nonspecific abnormal finding of lung field: Secondary | ICD-10-CM

## 2021-06-15 DIAGNOSIS — J189 Pneumonia, unspecified organism: Principal | ICD-10-CM

## 2021-06-15 LAB — COMPREHENSIVE METABOLIC PANEL
ALT: 14 U/L (ref 0–44)
AST: 15 U/L (ref 15–41)
Albumin: 2.6 g/dL — ABNORMAL LOW (ref 3.5–5.0)
Alkaline Phosphatase: 115 U/L (ref 38–126)
Anion gap: 11 (ref 5–15)
BUN: 13 mg/dL (ref 8–23)
CO2: 23 mmol/L (ref 22–32)
Calcium: 9.6 mg/dL (ref 8.9–10.3)
Chloride: 98 mmol/L (ref 98–111)
Creatinine, Ser: 0.69 mg/dL (ref 0.44–1.00)
GFR, Estimated: >60 mL/min (ref >60)
Glucose, Bld: 104 mg/dL — ABNORMAL HIGH (ref 70–99)
Potassium: 4.2 mmol/L (ref 3.5–5.1)
Sodium: 132 mmol/L — ABNORMAL LOW (ref 135–145)
Total Bilirubin: 0.6 mg/dL (ref 0.3–1.2)
Total Protein: 6.7 g/dL (ref 6.5–8.1)

## 2021-06-15 LAB — GLUCOSE, CAPILLARY
Glucose-Capillary: 102 mg/dL — ABNORMAL HIGH (ref 70–99)
Glucose-Capillary: 87 mg/dL (ref 70–99)
Glucose-Capillary: 87 mg/dL (ref 70–99)
Glucose-Capillary: 95 mg/dL (ref 70–99)

## 2021-06-15 LAB — HEMOGLOBIN A1C
Hgb A1c MFr Bld: 6.4 % — ABNORMAL HIGH (ref 4.8–5.6)
Hgb A1c MFr Bld: 6.3 % — ABNORMAL HIGH (ref 4.8–5.6)
Mean Plasma Glucose: 136.98 mg/dL
Mean Plasma Glucose: 134.11 mg/dL

## 2021-06-15 LAB — CBC
HCT: 33.8 % — ABNORMAL LOW (ref 36.0–46.0)
Hemoglobin: 11.2 g/dL — ABNORMAL LOW (ref 12.0–15.0)
MCH: 28.6 pg (ref 26.0–34.0)
MCHC: 33.1 g/dL (ref 30.0–36.0)
MCV: 86.4 fL (ref 80.0–100.0)
Platelets: 572 10*3/uL — ABNORMAL HIGH (ref 150–400)
RBC: 3.91 MIL/uL (ref 3.87–5.11)
RDW: 13.8 % (ref 11.5–15.5)
WBC: 24 10*3/uL — ABNORMAL HIGH (ref 4.0–10.5)
nRBC: 0 % (ref 0.0–0.2)

## 2021-06-15 LAB — LACTIC ACID, PLASMA: Lactic Acid, Venous: 1.2 mmol/L (ref 0.5–1.9)

## 2021-06-15 LAB — HIV ANTIBODY (ROUTINE TESTING W REFLEX): HIV Screen 4th Generation wRfx: NONREACTIVE

## 2021-06-15 MED ORDER — DOXYLAMINE SUCCINATE (SLEEP) 25 MG PO TABS
25.0000 mg | ORAL_TABLET | Freq: Every evening | ORAL | Status: DC | PRN
  Administered 2021-06-15: 25 mg via ORAL
  Filled 2021-06-15 (×2): qty 1

## 2021-06-15 MED ORDER — ORAL CARE MOUTH RINSE
15.0000 mL | Freq: Two times a day (BID) | OROMUCOSAL | Status: DC
  Administered 2021-06-15 – 2021-06-18 (×5): 15 mL via OROMUCOSAL

## 2021-06-15 MED ORDER — INFLUENZA VAC SPLIT QUAD 0.5 ML IM SUSY
0.5000 mL | PREFILLED_SYRINGE | INTRAMUSCULAR | Status: AC
  Administered 2021-06-16: 0.5 mL via INTRAMUSCULAR
  Filled 2021-06-15: qty 0.5

## 2021-06-15 MED ORDER — ENSURE ENLIVE PO LIQD
237.0000 mL | Freq: Two times a day (BID) | ORAL | Status: DC
  Administered 2021-06-15 – 2021-06-18 (×5): 237 mL via ORAL

## 2021-06-15 MED ORDER — IPRATROPIUM-ALBUTEROL 0.5-2.5 (3) MG/3ML IN SOLN: 3.0000 mL | RESPIRATORY_TRACT | Status: DC | PRN

## 2021-06-15 MED ORDER — HYDROCODONE-ACETAMINOPHEN 5-325 MG PO TABS
1.0000 | ORAL_TABLET | ORAL | Status: DC | PRN
  Administered 2021-06-15 – 2021-06-18 (×12): 1 via ORAL
  Filled 2021-06-15 (×12): qty 1

## 2021-06-15 MED ORDER — TRAZODONE HCL 50 MG PO TABS: 50.0000 mg | ORAL_TABLET | Freq: Once | ORAL | Status: DC

## 2021-06-15 MED ORDER — HYDROMORPHONE HCL 1 MG/ML IJ SOLN: 1.0000 mg | INTRAMUSCULAR | Status: DC | PRN

## 2021-06-15 NOTE — Consult Note (Signed)
NAME:  Kim Ross, MRN:  [id number], DOB:  [birth year], LOS: 1 ADMISSION DATE:  06/14/2021, CONSULTATION DATE:  06/15/21 REFERRING MD:  Cathlean Sauer, CHIEF COMPLAINT:  chest pain   History of Present Illness:  65yF with history of PSA, DM2, smoking, emphysema, and recently discovered mediastinal LAD and left lung masses who was originally planned for EBUS/navigation but had case postponed due to covid-19 infection. Developed productive cough at home, was started on doxy and then had persistent severe chest pain that prompted her to come to [facility] ED 9/11 where she was admitted for presumed post obstructive pneumonia. Still has productive cough, chest pain which opioids relieves somewhat, DOE.  Pertinent  Medical History  Smoking Emphysema Left lung masses and mediastinal LAD DM2  Significant Hospital Events: Including procedures, antibiotic start and stop dates in addition to other pertinent events   9/11 admitted   Interim History / Subjective:    Objective   Blood pressure 118/65, pulse (!) 107, temperature 98 F (36.7 C), temperature source Oral, resp. rate 17, height 5\' 5"  (1.651 m), weight 61.2 kg, SpO2 94 %.        Intake/Output Summary (Last 24 hours) at 06/15/2021 1542 Last data filed at 06/15/2021 0946 Gross per 24 hour  Intake 694.34 ml  Output --  Net 694.34 ml   Filed Weights   06/15/21 0029  Weight: 61.2 kg    Examination: General appearance: chronically ill appearing 65 y.o., female, NAD, conversant  Eyes: anicteric sclerae, moist conjunctivae; no lid-lag; PERRL, tracking appropriately HENT: NCAT; dry MM Neck: Trachea midline; no lymphadenopathy, no JVD Lungs: Diminished b/l, no crackles, no wheeze, with normal respiratory effort CV: tachycardic, RR, no MRGs  Abdomen: Soft, non-tender; non-distended, BS present  Extremities: No peripheral edema, radial and DP pulses present bilaterally  Skin: Normal temperature, turgor and texture; no rash Psych: Appropriate  affect Neuro: Alert and oriented to person and place, no focal deficit   CTA Chest 06/14/21 reviewed by me and remarkable for mediastinal LAD, left lung masses, LUL gg nodule  TTE 05/05/21 with mildly elevated RVSP  PLTs 572, BUN 13   Resolved Hospital Problem list     Assessment & Plan:   Left lung masses Mediastinal LAD LUL GG nodule Possible post-obstructive pneumonia - plan for linear probe and rp-EBUS under general on 06/17/21 at 9:30 - hold ppx AC the day of the procedure - NPO after midnight the night before the procedure   Smoking - cessation encouraged  Best Practice (right click and "Reselect all SmartList Selections" daily)   Per [facility]  Labs   CBC: Recent Labs  Lab 06/14/21 1855 06/15/21 0117  WBC 23.0* 24.0*  NEUTROABS 19.2*  --   HGB 12.5 11.2*  HCT 38.8 33.8*  MCV 87.0 86.4  PLT 537* 572*    Basic Metabolic Panel: Recent Labs  Lab 06/14/21 1855 06/15/21 0117  NA 134* 132*  K 4.2 4.2  CL 100 98  CO2 23 23  GLUCOSE 103* 104*  BUN 17 13  CREATININE 0.64 0.69  CALCIUM 9.8 9.6   GFR: Estimated Creatinine Clearance: 64.8 mL/min (by C-G formula based on SCr of 0.69 mg/dL). Recent Labs  Lab 06/14/21 1855 06/14/21 2310 06/15/21 0117  WBC 23.0*  --  24.0*  LATICACIDVEN  --  1.2 1.2    Liver Function Tests: Recent Labs  Lab 06/14/21 1855 06/15/21 0117  AST 18 15  ALT 16 14  ALKPHOS 139* 115  BILITOT 0.7 0.6  PROT 7.7  6.7  ALBUMIN 2.8* 2.6*   No results for input(s): LIPASE, AMYLASE in the last 168 hours. No results for input(s): AMMONIA in the last 168 hours.  ABG    Component Value Date/Time   TCO2 26 10/11/2008 1345     Coagulation Profile: No results for input(s): INR, PROTIME in the last 168 hours.  Cardiac Enzymes: No results for input(s): CKTOTAL, CKMB, CKMBINDEX, TROPONINI in the last 168 hours.  HbA1C: HbA1c, POC (controlled diabetic range)  Date/Time Value Ref Range Status  04/29/2021 09:55 AM 6.3 0.0 - 7.0 %  Final   Hgb A1c MFr Bld  Date/Time Value Ref Range Status  06/15/2021 01:17 AM 6.3 (H) 4.8 - 5.6 % Final    Comment:    (NOTE) Pre diabetes:          5.7%-6.4%  Diabetes:              >6.4%  Glycemic control for   <7.0% adults with diabetes   06/14/2021 11:10 PM 6.4 (H) 4.8 - 5.6 % Final    Comment:    (NOTE) Pre diabetes:          5.7%-6.4%  Diabetes:              >6.4%  Glycemic control for   <7.0% adults with diabetes     CBG: Recent Labs  Lab 06/15/21 0035 06/15/21 0834 06/15/21 1216  GLUCAP 87 87 95    Review of Systems:   Negative except as in HPI  Past Medical History:  She,  has a past medical history of Acute pain of left shoulder (12/01/2020), Anxiety, Aspiration pneumonitis ([place]) (08/05/2019), Breast cancer screening (12/30/2017), Chest wall pain (11/05/2019), Cocaine abuse ([place]), Depression, Drug intoxication ([place]) (08/05/2019), Encounter for screening for HIV (08/05/2019), Goiter (09/05/2019), History of recurrent UTIs, No-show for appointment (09/24/2020), Pneumonia, Pre-diabetes, and Urinary frequency (04/10/2013).   Surgical History:   Past Surgical History:  Procedure Laterality Date   ABDOMINAL HYSTERECTOMY     HEMORRHOID SURGERY       Social History:   reports that she has been smoking cigarettes. She has been smoking an average of .25 packs per day. She has never used smokeless tobacco. She reports that she does not currently use alcohol after a past usage of about 1.0 standard drink per week. She reports that she does not use drugs.   Family History:  Her family history includes Asthma in her mother; Cancer (age of onset: 53) in her sister.   Allergies Allergies  Allergen Reactions   Ibuprofen Nausea And Vomiting   Trazodone And Nefazodone     AM Dizziness & Drowsiness      Home Medications  Prior to Admission medications   Medication Sig Start Date End Date Taking? Authorizing Provider  atorvastatin (LIPITOR) 40 MG tablet TAKE 1  TABLET BY MOUTH EVERY DAY 02/03/21  Yes Milus Banister C, DO  doxycycline (VIBRA-TABS) 100 MG tablet Take 1 tablet (100 mg total) by mouth 2 (two) times daily. 06/12/21  Yes Collene Gobble, MD  fluticasone (FLONASE) 50 MCG/ACT nasal spray Place 1 spray into both nostrils daily. 1 spray in each nostril every day 04/29/21  Yes Gladys Damme, MD  HYDROcodone-acetaminophen (NORCO/VICODIN) 5-325 MG tablet Take 1 tablet by mouth every 6 (six) hours as needed for moderate pain. 06/09/21  Yes Collene Gobble, MD  meclizine (ANTIVERT) 25 MG tablet TAKE 1 TABLET BY MOUTH TWICE A DAY 10/09/20  Yes Milus Banister C, DO  metFORMIN (GLUCOPHAGE)  500 MG tablet TAKE 1 TABLET BY MOUTH 2 TIMES DAILY WITH A MEAL. Patient taking differently: Take 250 mg by mouth daily with breakfast. 05/20/21  Yes Erskine Emery, MD  Multiple Vitamins-Minerals (MULTIVITAMIN WITH MINERALS) tablet Take 1 tablet by mouth daily. Patient taking differently: Take 1 tablet by mouth every other day. 04/27/13  Yes Olam Idler, MD  sertraline (ZOLOFT) 50 MG tablet TAKE 1 TABLET BY MOUTH EVERY DAY 11/30/20  Yes Daisy Floro, DO  traMADol (ULTRAM) 50 MG tablet Take 1-2 tablets as needed every 6 hours for pain 05/19/21  Yes Byrum, Rose Fillers, MD  triamcinolone ointment (KENALOG) 0.5 % Apply 1 application topically 2 (two) times daily. For moderate to severe eczema.  Do not use for more than 1 week at a time. Patient taking differently: Apply 1 application topically 2 (two) times daily. For moderate to severe eczema.  Do not use for more than 1 week at a time. 11/28/20  Yes Daisy Floro, DO  Elastic Bandages & Supports (ABDOMINAL BINDER/ELASTIC LARGE) MISC 1 Package by Does not apply route daily. Patient not taking: Reported on 06/14/2021 11/05/19   Richarda Osmond, MD  hydrOXYzine (ATARAX/VISTARIL) 10 MG tablet Take 1-3 tabs PO q8hrs PRN Patient not taking: No sig reported 02/04/21   Mayers, Cari S, PA-C  terbinafine (LAMISIL) 250 MG tablet  Take 1 tablet (250 mg total) by mouth daily. Patient not taking: No sig reported 01/07/21   Milus Banister C, DO  triamcinolone (KENALOG) 0.1 % Apply 1 application topically 2 (two) times daily. Patient not taking: No sig reported 12/18/20   Suzy Bouchard, PA-C

## 2021-06-15 NOTE — Progress Notes (Addendum)
PROGRESS NOTE    Kim Ross  YYT:[id number] DOB: 12-May-1956 DOA: 06/14/2021 PCP: Erskine Emery, MD    Brief Narrative:  Kim Ross was admitted to the hospital with the working diagnosis of post-obstructive pneumonia in the settign of lung cancer.   65 year old female with past medical history for polysubstance abuse, aspiration pneumonitis, type 2 diabetes mellitus, dyslipidemia, anxiety, cocaine abuse and recurrent urine tract infections who was recently diagnosed with lung masses likely malignant.  Her lung biopsy was postponed due to her recent SARS COVID-19 infection.  At home patient developed severe weakness, cough, malaise and fever.  Because of persistent symptoms she was brought to the hospital for further evaluation.  On her initial physical examination her temperature was 98.6, blood pressure 88/73, heart rate 124, respiratory rate 28, oxygen saturation 93% on room air.  She had dry mucous membranes, her lungs had decreased air movement bilaterally, coarse breath sounds and expiratory wheeze with rales, heart S1-S2, present, tachycardic, her abdomen was soft, no lower extremity edema.  Evident muscle wasting and deconditioning.  Cachectic looking.  Sodium 134, potassium 4.2, chloride 100, bicarb 23, glucose 103, BUN 17, creatinine 0.64, white count 23.0, hemoglobin 12.5, hematocrit 38.8, platelets 537. SARS COVID-19 positive on 05/29/2021.  Her chest radiograph had a large round mass at the left lower lobe with another semicircular, in the left upper lobe..   Assessment & Plan:   Principal Problem:   Lobar pneumonia ([place]) Active Problems:   Generalized anxiety disorder   Hyperlipidemia   Lung mass   Diabetes ([place])   Tobacco abuse   Left lung mass with post-obstructive pneumonia (sepsis ruled out). Patient continue to have dyspnea and chest pain.  I suspect that most of her symptoms are related to rapidly growing left lung mass.   Plan to continue short course of  antimitotic therapy can ruled out underlying infection. Supplemental 02 per [place] and as needed bronchodilators. Pain control with IV hydromorphone and hydrocodone  Will consult pulmonary if bronchoscopy can be performed during her hospitalization.   Per her husband patient's functional physical capacity has rapidly declined over last 2 weeks.   2. T2DM with dyslipidemia. Fasting glucose has been stable at 104, capillary in the low side of 87 and 95. Will hold on insulin therapy for now because risk of hypoglycemia.  Continue with satin therapy.    3.  Moderate to severe calorie protein malnutrition.  Consult nutrition for further evaluation.   4. Tobacco abuse. Smoking cessation counseling   5. Hyponatremia. Na 132 with K at 4,2 and serum bicarbonate at 23. Clinically euvolemic.  Will hold on IV fluids and will follow renal function in am Liberate diet with no restrictions.    Patient continue to be at high risk for lung infection   Status is: Inpatient  Remains inpatient appropriate because:Inpatient level of care appropriate due to severity of illness  Dispo: The patient is from: Home              Anticipated d/c is to: Home              Patient currently is not medically stable to d/c.   Difficult to place patient No  DVT prophylaxis: Enoxaparin   Code Status:    full  Family Communication:   I spoke with patient's husband at the bedside, we talked in detail about patient's condition, plan of care and prognosis and all questions were addressed.   Antimicrobials:  Ceftriaxone     Subjective:  Patient continue  to have dyspnea and chest pain, no nausea or vomiting. Very weak and deconditioned.   Objective: Vitals:   06/15/21 0029 06/15/21 0239 06/15/21 0446 06/15/21 1112  BP:  126/69 (!) 102/55 129/73  Pulse:  (!) 106 (!) 101 99  Resp:  18 18 17   Temp:  97.9 F (36.6 C) 98.5 F (36.9 C) 97.6 F (36.4 C)  TempSrc:  Oral Oral Oral  SpO2:  100% 92% 94%  Weight:  61.2 kg     Height: 5\' 5"  (1.651 m)       Intake/Output Summary (Last 24 hours) at 06/15/2021 1341 Last data filed at 06/15/2021 0946 Gross per 24 hour  Intake 694.34 ml  Output --  Net 694.34 ml   Filed Weights   06/15/21 0029  Weight: 61.2 kg    Examination:   General: positive pain and dyspnea. Deconditioned and ill looking appearing.  Neurology: Awake and alert, non focal  E ENT: no pallor, no icterus, oral mucosa moist Cardiovascular: No JVD. S1-S2 present, rhythmic, no gallops, rubs, or murmurs. No lower extremity edema. Pulmonary:  decreased breath sounds on the left with  scattered  rhonchi and rales. Gastrointestinal. Abdomen soft and non tender Skin. No rashes Musculoskeletal: no joint deformities     Data Reviewed: I have personally reviewed following labs and imaging studies  CBC: Recent Labs  Lab 06/14/21 1855 06/15/21 0117  WBC 23.0* 24.0*  NEUTROABS 19.2*  --   HGB 12.5 11.2*  HCT 38.8 33.8*  MCV 87.0 86.4  PLT 537* 332*   Basic Metabolic Panel: Recent Labs  Lab 06/14/21 1855 06/15/21 0117  NA 134* 132*  K 4.2 4.2  CL 100 98  CO2 23 23  GLUCOSE 103* 104*  BUN 17 13  CREATININE 0.64 0.69  CALCIUM 9.8 9.6   GFR: Estimated Creatinine Clearance: 64.8 mL/min (by C-G formula based on SCr of 0.69 mg/dL). Liver Function Tests: Recent Labs  Lab 06/14/21 1855 06/15/21 0117  AST 18 15  ALT 16 14  ALKPHOS 139* 115  BILITOT 0.7 0.6  PROT 7.7 6.7  ALBUMIN 2.8* 2.6*   No results for input(s): LIPASE, AMYLASE in the last 168 hours. No results for input(s): AMMONIA in the last 168 hours. Coagulation Profile: No results for input(s): INR, PROTIME in the last 168 hours. Cardiac Enzymes: No results for input(s): CKTOTAL, CKMB, CKMBINDEX, TROPONINI in the last 168 hours. BNP (last 3 results) No results for input(s): PROBNP in the last 8760 hours. HbA1C: Recent Labs    06/14/21 2310 06/15/21 0117  HGBA1C 6.4* 6.3*   CBG: Recent Labs  Lab  06/15/21 0035 06/15/21 0834 06/15/21 1216  GLUCAP 87 87 95   Lipid Profile: No results for input(s): CHOL, HDL, LDLCALC, TRIG, CHOLHDL, LDLDIRECT in the last 72 hours. Thyroid Function Tests: No results for input(s): TSH, T4TOTAL, FREET4, T3FREE, THYROIDAB in the last 72 hours. Anemia Panel: No results for input(s): VITAMINB12, FOLATE, FERRITIN, TIBC, IRON, RETICCTPCT in the last 72 hours.    Radiology Studies: I have reviewed all of the imaging during this hospital visit personally     Scheduled Meds:  atorvastatin  40 mg Oral Daily   enoxaparin (LOVENOX) injection  40 mg Subcutaneous Q24H   feeding supplement  237 mL Oral BID BM   fluticasone  1 spray Each Nare Daily   guaiFENesin  600 mg Oral BID   [START ON 06/16/2021] influenza vac split quadrivalent PF  0.5 mL Intramuscular Tomorrow-1000   insulin  aspart  0-5 Units Subcutaneous QHS   insulin aspart  0-9 Units Subcutaneous TID WC   meclizine  25 mg Oral BID   mouth rinse  15 mL Mouth Rinse BID   multivitamin with minerals  1 tablet Oral QODAY   sertraline  50 mg Oral Daily   Continuous Infusions:  azithromycin     cefTRIAXone (ROCEPHIN)  IV     lactated ringers 100 mL/hr at 06/15/21 0026     LOS: 1 day        Dariela Stoker Gerome Apley, MD

## 2021-06-15 NOTE — Progress Notes (Signed)
Initial Nutrition Assessment  DOCUMENTATION CODES:   Non-severe (moderate) malnutrition in context of chronic illness  INTERVENTION:   -Ensure Plus PO BID, each provides 350 kcals and 13g protein  -Multivitamin with minerals daily   NUTRITION DIAGNOSIS:   Moderate Malnutrition related to chronic illness as evidenced by mild fat depletion, mild muscle depletion, percent weight loss, energy intake < or equal to 75% for > or equal to 1 month.  GOAL:   Patient will meet greater than or equal to 90% of their needs  MONITOR:   PO intake, Supplement acceptance, Labs, Weight trends, I & O's  REASON FOR ASSESSMENT:   Malnutrition Screening Tool    ASSESSMENT:   65 y.o. female with medical history significant of polysubstance abuse, aspiration pneumonitis, history of diabetes, hyperlipidemia, medication noncompliance, anxiety disorder, cocaine abuse, recurrent UTIs who was recently diagnosed with lung masses suspicious for lung malignancy.  Patient was scheduled for biopsy but then was positive for COVID-19 about 2 weeks ago.  Patient reports having poor appetite and not eating well for 2 weeks since losing her sense of smell and taste after having COVID-19. Pt ate part of a muffin for breakfast. Report some sensations of food getting stuck in her esophagus as well, so she tries to chew her food really well. Agrees to try Ensure supplements, vanilla.  Per weight records, pt has lost 23 lbs since 4/6 (14% wt loss x 5 months, significant for time frame).   Medications reviewed.  Labs reviewed: CBGs: 87-95 Low Na   NUTRITION - FOCUSED PHYSICAL EXAM:  Flowsheet Row Most Recent Value  Orbital Region Mild depletion  Upper Arm Region Mild depletion  Thoracic and Lumbar Region Unable to assess  Buccal Region Mild depletion  Temple Region Mild depletion  Clavicle Bone Region Mild depletion  Clavicle and Acromion Bone Region Mild depletion  Scapular Bone Region No depletion   Dorsal Hand Mild depletion  Patellar Region Unable to assess  Anterior Thigh Region Unable to assess  Posterior Calf Region Unable to assess  Edema (RD Assessment) None       Diet Order:   Diet Order             Diet regular Room service appropriate? Yes; Fluid consistency: Thin  Diet effective now                   EDUCATION NEEDS:   Education needs have been addressed  Skin:  Skin Assessment: Reviewed RN Assessment  Last BM:  PTA  Height:   Ht Readings from Last 1 Encounters:  06/15/21 5\' 5"  (1.651 m)    Weight:   Wt Readings from Last 1 Encounters:  06/15/21 61.2 kg    BMI:  Body mass index is 22.47 kg/m.  Estimated Nutritional Needs:   Kcal:  1650-1850  Protein:  75-90g  Fluid:  1.9L/day    Clayton Bibles, MS, RD, LDN Inpatient Clinical Dietitian Contact information available via Amion

## 2021-06-15 NOTE — Progress Notes (Signed)
   06/15/21 0022  Assess: MEWS Score  Temp 97.9 F (36.6 C)  BP 128/80  Pulse Rate (!) 111  Resp 18  SpO2 94 %  O2 Device Room Air  Assess: MEWS Score  MEWS Temp 0  MEWS Systolic 0  MEWS Pulse 2  MEWS RR 0  MEWS LOC 0  MEWS Score 2  MEWS Score Color Yellow  Assess: if the MEWS score is Yellow or Red  Were vital signs taken at a resting state? Yes  Focused Assessment No change from prior assessment  Does the patient meet 2 or more of the SIRS criteria? Yes  Does the patient have a confirmed or suspected source of infection? No  MEWS guidelines implemented *See Row Information* Yes  Treat  MEWS Interventions Administered scheduled meds/treatments  Take Vital Signs  Increase Vital Sign Frequency  Yellow: Q 2hr X 2 then Q 4hr X 2, if remains yellow, continue Q 4hrs  Escalate  MEWS: Escalate Yellow: discuss with charge nurse/RN and consider discussing with provider and RRT  Notify: Charge Nurse/RN  Name of Charge Nurse/RN Notified Carmin Richmond, RN  Date Charge Nurse/RN Notified 06/14/21  Time Charge Nurse/RN Notified 2342  Notify: Provider  Provider Name/Title Garba  Date Provider Notified 06/14/21  Time Provider Notified 2342 (notified on floor. Pt. was still in ED)  Notification Type Face-to-face  Notification Reason Other (Comment) (asked to see if patient was appropriate for floor)  Provider response No new orders  Date of Provider Response 06/14/21  Time of Provider Response 2342  Notify: Rapid Response  Name of Rapid Response RN Notified n/a; not needed at this time.  Document  Patient Outcome Stabilized after interventions  Progress note created (see row info) Yes  Assess: SIRS CRITERIA  SIRS Temperature  0  SIRS Pulse 1  SIRS Respirations  0  SIRS WBC 0  SIRS Score Sum  1

## 2021-06-15 NOTE — H&P (View-Only) (Signed)
NAME:  Kim Ross, MRN:  [id number], DOB:  1956-04-11, LOS: 1 ADMISSION DATE:  06/14/2021, CONSULTATION DATE:  06/15/21 REFERRING MD:  Cathlean Sauer, CHIEF COMPLAINT:  chest pain   History of Present Illness:  65yF with history of PSA, DM2, smoking, emphysema, and recently discovered mediastinal LAD and left lung masses who was originally planned for EBUS/navigation but had case postponed due to covid-19 infection. Developed productive cough at home, was started on doxy and then had persistent severe chest pain that prompted her to come to [facility] ED 9/11 where she was admitted for presumed post obstructive pneumonia. Still has productive cough, chest pain which opioids relieves somewhat, DOE.  Pertinent  Medical History  Smoking Emphysema Left lung masses and mediastinal LAD DM2  Significant Hospital Events: Including procedures, antibiotic start and stop dates in addition to other pertinent events   9/11 admitted   Interim History / Subjective:    Objective   Blood pressure 118/65, pulse (!) 107, temperature 98 F (36.7 C), temperature source Oral, resp. rate 17, height 5\' 5"  (1.651 m), weight 61.2 kg, SpO2 94 %.        Intake/Output Summary (Last 24 hours) at 06/15/2021 1542 Last data filed at 06/15/2021 0946 Gross per 24 hour  Intake 694.34 ml  Output --  Net 694.34 ml   Filed Weights   06/15/21 0029  Weight: 61.2 kg    Examination: General appearance: chronically ill appearing 65 y.o., female, NAD, conversant  Eyes: anicteric sclerae, moist conjunctivae; no lid-lag; PERRL, tracking appropriately HENT: NCAT; dry MM Neck: Trachea midline; no lymphadenopathy, no JVD Lungs: Diminished b/l, no crackles, no wheeze, with normal respiratory effort CV: tachycardic, RR, no MRGs  Abdomen: Soft, non-tender; non-distended, BS present  Extremities: No peripheral edema, radial and DP pulses present bilaterally  Skin: Normal temperature, turgor and texture; no rash Psych: Appropriate  affect Neuro: Alert and oriented to person and place, no focal deficit   CTA Chest 06/14/21 reviewed by me and remarkable for mediastinal LAD, left lung masses, LUL gg nodule  TTE 05/05/21 with mildly elevated RVSP  PLTs 572, BUN 13   Resolved Hospital Problem list     Assessment & Plan:   Left lung masses Mediastinal LAD LUL GG nodule Possible post-obstructive pneumonia - plan for linear probe and rp-EBUS under general on 06/17/21 at 9:30 - hold ppx AC the day of the procedure - NPO after midnight the night before the procedure   Smoking - cessation encouraged  Best Practice (right click and "Reselect all SmartList Selections" daily)   Per [facility]  Labs   CBC: Recent Labs  Lab 06/14/21 1855 06/15/21 0117  WBC 23.0* 24.0*  NEUTROABS 19.2*  --   HGB 12.5 11.2*  HCT 38.8 33.8*  MCV 87.0 86.4  PLT 537* 572*    Basic Metabolic Panel: Recent Labs  Lab 06/14/21 1855 06/15/21 0117  NA 134* 132*  K 4.2 4.2  CL 100 98  CO2 23 23  GLUCOSE 103* 104*  BUN 17 13  CREATININE 0.64 0.69  CALCIUM 9.8 9.6   GFR: Estimated Creatinine Clearance: 64.8 mL/min (by C-G formula based on SCr of 0.69 mg/dL). Recent Labs  Lab 06/14/21 1855 06/14/21 2310 06/15/21 0117  WBC 23.0*  --  24.0*  LATICACIDVEN  --  1.2 1.2    Liver Function Tests: Recent Labs  Lab 06/14/21 1855 06/15/21 0117  AST 18 15  ALT 16 14  ALKPHOS 139* 115  BILITOT 0.7 0.6  PROT 7.7  6.7  ALBUMIN 2.8* 2.6*   No results for input(s): LIPASE, AMYLASE in the last 168 hours. No results for input(s): AMMONIA in the last 168 hours.  ABG    Component Value Date/Time   TCO2 26 10/11/2008 1345     Coagulation Profile: No results for input(s): INR, PROTIME in the last 168 hours.  Cardiac Enzymes: No results for input(s): CKTOTAL, CKMB, CKMBINDEX, TROPONINI in the last 168 hours.  HbA1C: HbA1c, POC (controlled diabetic range)  Date/Time Value Ref Range Status  04/29/2021 09:55 AM 6.3 0.0 - 7.0 %  Final   Hgb A1c MFr Bld  Date/Time Value Ref Range Status  06/15/2021 01:17 AM 6.3 (H) 4.8 - 5.6 % Final    Comment:    (NOTE) Pre diabetes:          5.7%-6.4%  Diabetes:              >6.4%  Glycemic control for   <7.0% adults with diabetes   06/14/2021 11:10 PM 6.4 (H) 4.8 - 5.6 % Final    Comment:    (NOTE) Pre diabetes:          5.7%-6.4%  Diabetes:              >6.4%  Glycemic control for   <7.0% adults with diabetes     CBG: Recent Labs  Lab 06/15/21 0035 06/15/21 0834 06/15/21 1216  GLUCAP 87 87 95    Review of Systems:   Negative except as in HPI  Past Medical History:  She,  has a past medical history of Acute pain of left shoulder (12/01/2020), Anxiety, Aspiration pneumonitis ([place]) (08/05/2019), Breast cancer screening (12/30/2017), Chest wall pain (11/05/2019), Cocaine abuse ([place]), Depression, Drug intoxication ([place]) (08/05/2019), Encounter for screening for HIV (08/05/2019), Goiter (09/05/2019), History of recurrent UTIs, No-show for appointment (09/24/2020), Pneumonia, Pre-diabetes, and Urinary frequency (04/10/2013).   Surgical History:   Past Surgical History:  Procedure Laterality Date   ABDOMINAL HYSTERECTOMY     HEMORRHOID SURGERY       Social History:   reports that she has been smoking cigarettes. She has been smoking an average of .25 packs per day. She has never used smokeless tobacco. She reports that she does not currently use alcohol after a past usage of about 1.0 standard drink per week. She reports that she does not use drugs.   Family History:  Her family history includes Asthma in her mother; Cancer (age of onset: 43) in her sister.   Allergies Allergies  Allergen Reactions   Ibuprofen Nausea And Vomiting   Trazodone And Nefazodone     AM Dizziness & Drowsiness      Home Medications  Prior to Admission medications   Medication Sig Start Date End Date Taking? Authorizing Provider  atorvastatin (LIPITOR) 40 MG tablet TAKE 1  TABLET BY MOUTH EVERY DAY 02/03/21  Yes Milus Banister C, DO  doxycycline (VIBRA-TABS) 100 MG tablet Take 1 tablet (100 mg total) by mouth 2 (two) times daily. 06/12/21  Yes Collene Gobble, MD  fluticasone (FLONASE) 50 MCG/ACT nasal spray Place 1 spray into both nostrils daily. 1 spray in each nostril every day 04/29/21  Yes Gladys Damme, MD  HYDROcodone-acetaminophen (NORCO/VICODIN) 5-325 MG tablet Take 1 tablet by mouth every 6 (six) hours as needed for moderate pain. 06/09/21  Yes Collene Gobble, MD  meclizine (ANTIVERT) 25 MG tablet TAKE 1 TABLET BY MOUTH TWICE A DAY 10/09/20  Yes Milus Banister C, DO  metFORMIN (GLUCOPHAGE)  500 MG tablet TAKE 1 TABLET BY MOUTH 2 TIMES DAILY WITH A MEAL. Patient taking differently: Take 250 mg by mouth daily with breakfast. 05/20/21  Yes Erskine Emery, MD  Multiple Vitamins-Minerals (MULTIVITAMIN WITH MINERALS) tablet Take 1 tablet by mouth daily. Patient taking differently: Take 1 tablet by mouth every other day. 04/27/13  Yes Olam Idler, MD  sertraline (ZOLOFT) 50 MG tablet TAKE 1 TABLET BY MOUTH EVERY DAY 11/30/20  Yes Daisy Floro, DO  traMADol (ULTRAM) 50 MG tablet Take 1-2 tablets as needed every 6 hours for pain 05/19/21  Yes Byrum, Rose Fillers, MD  triamcinolone ointment (KENALOG) 0.5 % Apply 1 application topically 2 (two) times daily. For moderate to severe eczema.  Do not use for more than 1 week at a time. Patient taking differently: Apply 1 application topically 2 (two) times daily. For moderate to severe eczema.  Do not use for more than 1 week at a time. 11/28/20  Yes Daisy Floro, DO  Elastic Bandages & Supports (ABDOMINAL BINDER/ELASTIC LARGE) MISC 1 Package by Does not apply route daily. Patient not taking: Reported on 06/14/2021 11/05/19   Richarda Osmond, MD  hydrOXYzine (ATARAX/VISTARIL) 10 MG tablet Take 1-3 tabs PO q8hrs PRN Patient not taking: No sig reported 02/04/21   Mayers, Cari S, PA-C  terbinafine (LAMISIL) 250 MG tablet  Take 1 tablet (250 mg total) by mouth daily. Patient not taking: No sig reported 01/07/21   Milus Banister C, DO  triamcinolone (KENALOG) 0.1 % Apply 1 application topically 2 (two) times daily. Patient not taking: No sig reported 12/18/20   Suzy Bouchard, PA-C

## 2021-06-16 ENCOUNTER — Ambulatory Visit (INDEPENDENT_AMBULATORY_CARE_PROVIDER_SITE_OTHER): Payer: No Typology Code available for payment source | Admitting: Primary Care

## 2021-06-16 ENCOUNTER — Encounter (HOSPITAL_COMMUNITY): Payer: Self-pay | Admitting: Radiology

## 2021-06-16 DIAGNOSIS — Z720 Tobacco use: Secondary | ICD-10-CM

## 2021-06-16 DIAGNOSIS — E440 Moderate protein-calorie malnutrition: Secondary | ICD-10-CM | POA: Insufficient documentation

## 2021-06-16 LAB — CBC
HCT: 33.4 % — ABNORMAL LOW (ref 36.0–46.0)
Hemoglobin: 10.9 g/dL — ABNORMAL LOW (ref 12.0–15.0)
MCH: 28.1 pg (ref 26.0–34.0)
MCHC: 32.6 g/dL (ref 30.0–36.0)
MCV: 86.1 fL (ref 80.0–100.0)
Platelets: 513 10*3/uL — ABNORMAL HIGH (ref 150–400)
RBC: 3.88 MIL/uL (ref 3.87–5.11)
RDW: 13.8 % (ref 11.5–15.5)
WBC: 22.2 10*3/uL — ABNORMAL HIGH (ref 4.0–10.5)
nRBC: 0 % (ref 0.0–0.2)

## 2021-06-16 LAB — BASIC METABOLIC PANEL
Anion gap: 8 (ref 5–15)
BUN: 8 mg/dL (ref 8–23)
CO2: 25 mmol/L (ref 22–32)
Calcium: 9.3 mg/dL (ref 8.9–10.3)
Chloride: 99 mmol/L (ref 98–111)
Creatinine, Ser: 0.55 mg/dL (ref 0.44–1.00)
GFR, Estimated: >60 mL/min (ref >60)
Glucose, Bld: 143 mg/dL — ABNORMAL HIGH (ref 70–99)
Potassium: 3.5 mmol/L (ref 3.5–5.1)
Sodium: 132 mmol/L — ABNORMAL LOW (ref 135–145)

## 2021-06-16 MED ORDER — ACETAMINOPHEN 325 MG PO TABS
650.0000 mg | ORAL_TABLET | Freq: Four times a day (QID) | ORAL | Status: DC | PRN
  Administered 2021-06-18: 650 mg via ORAL
  Filled 2021-06-16: qty 2

## 2021-06-16 NOTE — Progress Notes (Signed)
Mobility Specialist - Progress Note    06/16/21 1156  Mobility  Activity Sat and stood x 3  Range of Motion/Exercises Active;Right leg;Left leg  Level of Assistance Independent after set-up  Assistive Device None  Mobility Sit up in bed/chair position for meals  Mobility Response Tolerated well  Mobility performed by Mobility specialist  $Mobility charge 1 Mobility    Upon entry pt was agreeable to ambulate and sat EOB. Pt stated not feeling well and requested to not ambulate in hall and was agreeable to complete bed exercises. Pt completed 1 sit to stand before resting in bed and was instructed to do 4 bed exercises; leg lifts, heel slides, abduction/adduction slides, and ankle pumps. Pt did report experiencing some back pain before end of session and was left in bed with call bell at side.  [place] Specialist Acute Rehabilitation Services Phone: [phone] 06/16/21, 12:00 PM

## 2021-06-16 NOTE — Progress Notes (Signed)
PROGRESS NOTE    Kim Ross  HDQ:[id number] DOB: [birth year] DOA: 06/14/2021 PCP: Erskine Emery, MD    Brief Narrative:  Kim Ross was admitted to the hospital with the working diagnosis of post-obstructive pneumonia in the settign of large lung mass, likely lung cancer.    65 year old female with past medical history for polysubstance abuse, aspiration pneumonitis, type 2 diabetes mellitus, dyslipidemia, anxiety, cocaine abuse and recurrent urine tract infections who was recently diagnosed with lung masses likely malignant.  Her lung biopsy was postponed due to her recent SARS COVID-19 infection 08/22.  At home patient developed severe weakness, cough, malaise and fever.  Because of persistent symptoms she was brought to the hospital for further evaluation.  On her initial physical examination her temperature was 98.6, blood pressure 88/73, heart rate 124, respiratory rate 28, oxygen saturation 93% on room air.  She had dry mucous membranes, her lungs had decreased air movement bilaterally, coarse breath sounds and expiratory wheeze with rales, heart S1-S2, present, tachycardic, her abdomen was soft, no lower extremity edema.  Evident muscle wasting and deconditioning.  Cachectic looking.   Sodium 134, potassium 4.2, chloride 100, bicarb 23, glucose 103, BUN 17, creatinine 0.64, white count 23.0, hemoglobin 12.5, hematocrit 38.8, platelets 537. SARS COVID-19 positive on 05/29/2021.   Her chest radiograph had a large round mass at the left lower lobe with another semicircular, in the left upper lobe..  CT chest with no pulmonary embolism, significant interval progression of malignancy with enlargement of the left lung and mediastinal masses.  Small left pleural effusion.   EKG 105 bpm, with normal axis, normal intervals, sinus rhythm, no significant ST segment or T wave changes.   Patient was placed on IV antibiotic therapy, IV analgesics and bronchodilators.  Consulted pulmonary for  diagnostic bronchoscopy.   Per her husband patient's functional physical capacity has rapidly declined over last 2 weeks.   Assessment & Plan:   Principal Problem:   Lobar pneumonia ([place]) Active Problems:   Generalized anxiety disorder   Hyperlipidemia   Lung mass   Diabetes ([place])   Tobacco abuse   Malnutrition of moderate degree   Left lung mass with post-obstructive pneumonia (sepsis ruled out).  Dyspnea has improved but not yet back to baseline, continue to have chest pain that is improved with analgesics.  Her oxygenation is 91 to 94% on room air.  Wbc is 22,2 from 24,0   Plan to continue antibiotic therapy for 5 days total, continue oxymetry monitoring and as needed bronchodilator therapy. Pain control with IV hydromorphone and oral hydrocodone  Will consult pulmonary if bronchoscopy can be performed during her hospitalization.  Diagnostic bronchoscopy scheduled for 06/17/21.    2. T2DM with dyslipidemia.  Fasting glucose is 143, patient with improved po intake. Continue to hold on insulin therapy and follow up on capillary glucose bid prn.  On statin therapy with atorvastatin.    3.  Moderate calorie protein malnutrition.  Continue nutritional supplements.    4. Tobacco abuse/ depression. Continue with smoking cessation counseling. Continue with sertraline.    5. Hyponatremia.  Persistent hyponatremia with serum Na at 132, with K at 3,5 and serum bicarbonate at 25. Renal function with serum cr at 0,55. Continue with regular diet with no restrictions, IV fluids have been discontinued.     Patient continue to be at high risk for worsening lung mass   Status is: Inpatient  Remains inpatient appropriate because:Inpatient level of care appropriate due to severity of illness  Dispo: The patient is from: Home              Anticipated d/c is to: Home              Patient currently is not medically stable to d/c.   Difficult to place patient No   DVT  prophylaxis: Enoxaparin (on hold for bronchoscopy)   Code Status:    full  Family Communication:   No family at the bedside      Nutrition Status: Nutrition Problem: Moderate Malnutrition Etiology: chronic illness Signs/Symptoms: mild fat depletion, mild muscle depletion, percent weight loss, energy intake < or equal to 75% for > or equal to 1 month Interventions: Ensure Enlive (each supplement provides 350kcal and 20 grams of protein), MVI   Consultants:  Pulmonary    Antimicrobials:  Ceftriaxone     Subjective: Patient with no nausea or vomiting, continue to have chest pain, moderate in intensity, improved transitory with analgesics. Dyspnea has improved but not yet back to baseline.   Objective: Vitals:   06/15/21 1112 06/15/21 1439 06/15/21 2009 06/16/21 0424  BP: 129/73 118/65 123/77 124/66  Pulse: 99 (!) 107 99 (!) 102  Resp: 17 17 15 18   Temp: 97.6 F (36.4 C) 98 F (36.7 C) 98.8 F (37.1 C) 97.9 F (36.6 C)  TempSrc: Oral Oral Oral Oral  SpO2: 94% 94% 91% 94%  Weight:      Height:        Intake/Output Summary (Last 24 hours) at 06/16/2021 1212 Last data filed at 06/16/2021 0112 Gross per 24 hour  Intake 340 ml  Output --  Net 340 ml   Filed Weights   06/15/21 0029  Weight: 61.2 kg    Examination:   General: deconditioned and ill looking appearing  Neurology: Awake and alert, non focal  E ENT: positive pallor, no icterus, oral mucosa moist Cardiovascular: No JVD. S1-S2 present, rhythmic, no gallops, rubs, or murmurs. No lower extremity edema. Pulmonary: positive breath sounds bilaterally, with no wheezing, scattered bilateral rhonchi and rales. Gastrointestinal. Abdomen soft and non tender Skin. No rashes Musculoskeletal: no joint deformities     Data Reviewed: I have personally reviewed following labs and imaging studies  CBC: Recent Labs  Lab 06/14/21 1855 06/15/21 0117 06/16/21 0432  WBC 23.0* 24.0* 22.2*  NEUTROABS 19.2*  --   --    HGB 12.5 11.2* 10.9*  HCT 38.8 33.8* 33.4*  MCV 87.0 86.4 86.1  PLT 537* 572* 001*   Basic Metabolic Panel: Recent Labs  Lab 06/14/21 1855 06/15/21 0117 06/16/21 0432  NA 134* 132* 132*  K 4.2 4.2 3.5  CL 100 98 99  CO2 23 23 25   GLUCOSE 103* 104* 143*  BUN 17 13 8   CREATININE 0.64 0.69 0.55  CALCIUM 9.8 9.6 9.3   GFR: Estimated Creatinine Clearance: 64.8 mL/min (by C-G formula based on SCr of 0.55 mg/dL). Liver Function Tests: Recent Labs  Lab 06/14/21 1855 06/15/21 0117  AST 18 15  ALT 16 14  ALKPHOS 139* 115  BILITOT 0.7 0.6  PROT 7.7 6.7  ALBUMIN 2.8* 2.6*   No results for input(s): LIPASE, AMYLASE in the last 168 hours. No results for input(s): AMMONIA in the last 168 hours. Coagulation Profile: No results for input(s): INR, PROTIME in the last 168 hours. Cardiac Enzymes: No results for input(s): CKTOTAL, CKMB, CKMBINDEX, TROPONINI in the last 168 hours. BNP (last 3 results) No results for input(s): PROBNP in the last 8760 hours. HbA1C: Recent  Labs    06/14/21 2310 06/15/21 0117  HGBA1C 6.4* 6.3*   CBG: Recent Labs  Lab 06/15/21 0035 06/15/21 0834 06/15/21 1216 06/15/21 1639  GLUCAP 87 87 95 102*   Lipid Profile: No results for input(s): CHOL, HDL, LDLCALC, TRIG, CHOLHDL, LDLDIRECT in the last 72 hours. Thyroid Function Tests: No results for input(s): TSH, T4TOTAL, FREET4, T3FREE, THYROIDAB in the last 72 hours. Anemia Panel: No results for input(s): VITAMINB12, FOLATE, FERRITIN, TIBC, IRON, RETICCTPCT in the last 72 hours.    Radiology Studies: I have reviewed all of the imaging during this hospital visit personally     Scheduled Meds:  atorvastatin  40 mg Oral Daily   feeding supplement  237 mL Oral BID BM   fluticasone  1 spray Each Nare Daily   guaiFENesin  600 mg Oral BID   meclizine  25 mg Oral BID   mouth rinse  15 mL Mouth Rinse BID   multivitamin with minerals  1 tablet Oral QODAY   sertraline  50 mg Oral Daily    Continuous Infusions:  cefTRIAXone (ROCEPHIN)  IV Stopped (06/15/21 2214)     LOS: 2 days        Jaheim Canino Gerome Apley, MD

## 2021-06-16 NOTE — Anesthesia Preprocedure Evaluation (Addendum)
Anesthesia Evaluation  Patient identified by MRN, date of birth, ID band Patient awake    Reviewed: Allergy & Precautions, NPO status , Patient's Chart, lab work & pertinent test results  Airway Mallampati: II  TM Distance: >3 FB Neck ROM: Full    Dental no notable dental hx. (+) Teeth Intact, Dental Advisory Given   Pulmonary shortness of breath, Current Smoker,  LL mass covid + 2 weeks ago   Pulmonary exam normal breath sounds clear to auscultation       Cardiovascular Exercise Tolerance: Good negative cardio ROS Normal cardiovascular exam Rhythm:Regular Rate:Normal  05/05/21 Echo IMPRESSIONS    1. Left ventricular ejection fraction, by estimation, is 60 to 65%. The  left ventricle has normal function. Left ventricular endocardial border  not optimally defined to evaluate regional wall motion. There is mild left  ventricular hypertrophy of the  basal-septal segment. Left ventricular diastolic parameters are  indeterminate.  2. Right ventricular systolic function is normal. The right ventricular  size is normal.  3. The mitral valve is normal in structure. No evidence of mitral valve  regurgitation. No evidence of mitral stenosis.  4. The aortic valve is tricuspid. Aortic valve regurgitation is not  visualized. No aortic stenosis is present.  5. Aortic dilatation noted. There is mild dilatation of the aortic root,  measuring 40 mm. There is mild dilatation of the ascending aorta,  measuring 40 mm.  6. The inferior vena cava is normal in size with greater than 50%  respiratory variability, suggesting right atrial pressure of 3 mmHg.    Neuro/Psych Anxiety    GI/Hepatic negative GI ROS, (+)     substance abuse  ,   Endo/Other  diabetes  Renal/GU Lab Results      Component                Value               Date                      CREATININE               0.55                06/16/2021                BUN                       8                   06/16/2021                NA                       132 (L)             06/16/2021                K                        3.5                 06/16/2021                CL                       99  06/16/2021                CO2                      25                  06/16/2021             negative genitourinary   Musculoskeletal negative musculoskeletal ROS (+)   Abdominal   Peds  Hematology Lab Results      Component                Value               Date                      WBC                      22.2 (H)            06/16/2021                HGB                      10.9 (L)            06/16/2021                HCT                      33.4 (L)            06/16/2021                MCV                      86.1                06/16/2021                PLT                      513 (H)             06/16/2021              Anesthesia Other Findings All ibuprofen, trazedone  Reproductive/Obstetrics                           Anesthesia Physical Anesthesia Plan  ASA: 3  Anesthesia Plan: General   Post-op Pain Management:    Induction: Intravenous  PONV Risk Score and Plan: Treatment may vary due to age or medical condition and Ondansetron  Airway Management Planned: Oral ETT  Additional Equipment: None  Intra-op Plan:   Post-operative Plan: Extubation in OR  Informed Consent: I have reviewed the patients History and Physical, chart, labs and discussed the procedure including the risks, benefits and alternatives for the proposed anesthesia with the patient or authorized representative who has indicated his/her understanding and acceptance.     Dental advisory given  Plan Discussed with:   Anesthesia Plan Comments:        Anesthesia Quick Evaluation

## 2021-06-17 ENCOUNTER — Encounter (HOSPITAL_COMMUNITY): Payer: Self-pay | Admitting: Internal Medicine

## 2021-06-17 ENCOUNTER — Inpatient Hospital Stay (HOSPITAL_COMMUNITY): Payer: 59 | Admitting: Anesthesiology

## 2021-06-17 ENCOUNTER — Encounter (HOSPITAL_COMMUNITY)
Admission: EM | Disposition: A | Discharge status: Home / Self Care | Payer: Self-pay | Source: Home / Self Care | Attending: Internal Medicine

## 2021-06-17 DIAGNOSIS — J181 Lobar pneumonia, unspecified organism: Secondary | ICD-10-CM | POA: Diagnosis not present

## 2021-06-17 HISTORY — PX: VIDEO BRONCHOSCOPY: SHX5072

## 2021-06-17 HISTORY — PX: ENDOBRONCHIAL ULTRASOUND: SHX5096

## 2021-06-17 HISTORY — PX: BRONCHIAL NEEDLE ASPIRATION BIOPSY: SHX5106

## 2021-06-17 LAB — GLUCOSE, CAPILLARY: Glucose-Capillary: 116 mg/dL — ABNORMAL HIGH (ref 70–99)

## 2021-06-17 SURGERY — ENDOBRONCHIAL ULTRASOUND (EBUS)
Anesthesia: General | Laterality: Left

## 2021-06-17 MED ORDER — MIDAZOLAM HCL 2 MG/2ML IJ SOLN
INTRAMUSCULAR | Status: DC | PRN
  Administered 2021-06-17 (×2): 1 mg via INTRAVENOUS

## 2021-06-17 MED ORDER — MIDAZOLAM HCL 2 MG/2ML IJ SOLN
INTRAMUSCULAR | Status: AC
  Filled 2021-06-17: qty 2

## 2021-06-17 MED ORDER — SUGAMMADEX SODIUM 200 MG/2ML IV SOLN
INTRAVENOUS | Status: DC | PRN
  Administered 2021-06-17: 200 mg via INTRAVENOUS

## 2021-06-17 MED ORDER — PHENYLEPHRINE HCL (PRESSORS) 10 MG/ML IV SOLN
INTRAVENOUS | Status: AC
  Filled 2021-06-17: qty 1

## 2021-06-17 MED ORDER — LACTATED RINGERS IV SOLN: INTRAVENOUS | Status: DC

## 2021-06-17 MED ORDER — LIDOCAINE 2% (20 MG/ML) 5 ML SYRINGE
INTRAMUSCULAR | Status: DC | PRN
  Administered 2021-06-17: 90 mg via INTRAVENOUS

## 2021-06-17 MED ORDER — FENTANYL CITRATE (PF) 100 MCG/2ML IJ SOLN
INTRAMUSCULAR | Status: DC | PRN
  Administered 2021-06-17 (×2): 50 ug via INTRAVENOUS

## 2021-06-17 MED ORDER — PHENYLEPHRINE HCL-NACL 20-0.9 MG/250ML-% IV SOLN
INTRAVENOUS | Status: DC | PRN
  Administered 2021-06-17: 25 ug/min via INTRAVENOUS

## 2021-06-17 MED ORDER — LACTATED RINGERS IV SOLN: INTRAVENOUS | Status: DC | PRN

## 2021-06-17 MED ORDER — ROCURONIUM BROMIDE 10 MG/ML (PF) SYRINGE
PREFILLED_SYRINGE | INTRAVENOUS | Status: DC | PRN
  Administered 2021-06-17: 10 mg via INTRAVENOUS
  Administered 2021-06-17: 40 mg via INTRAVENOUS

## 2021-06-17 MED ORDER — PROPOFOL 10 MG/ML IV BOLUS
INTRAVENOUS | Status: DC | PRN
Start: 2021-06-17 — End: 2021-06-17
  Administered 2021-06-17: 100 mg via INTRAVENOUS

## 2021-06-17 MED ORDER — FENTANYL CITRATE (PF) 100 MCG/2ML IJ SOLN
INTRAMUSCULAR | Status: AC
  Filled 2021-06-17: qty 2

## 2021-06-17 MED ORDER — ONDANSETRON HCL 4 MG/2ML IJ SOLN
INTRAMUSCULAR | Status: DC | PRN
  Administered 2021-06-17: 4 mg via INTRAVENOUS

## 2021-06-17 NOTE — Progress Notes (Signed)
Patient s/cp endo procedure Vital signs stable - see Epic for full detials Report called to RN -

## 2021-06-17 NOTE — Interval H&P Note (Signed)
History and Physical Interval Note:  06/17/2021 9:48 AM  Kim Ross  has presented today for surgery, with the diagnosis of left lung mass.  The various methods of treatment have been discussed with the patient and family. After consideration of risks, benefits and other options for treatment, the patient has consented to  Procedure(s): ENDOBRONCHIAL ULTRASOUND (Left) as a surgical intervention.  The patient's history has been reviewed, patient examined, no change in status, stable for surgery.  I have reviewed the patient's chart and labs.  Questions were answered to the patient's satisfaction.     Maryjane Hurter

## 2021-06-17 NOTE — Addendum Note (Signed)
Addendum  created 06/17/21 1327 by Barnet Glasgow, MD   Clinical Note Signed

## 2021-06-17 NOTE — Anesthesia Procedure Notes (Signed)
Date/Time: 06/17/2021 11:48 AM Performed by: Cynda Familia, CRNA Oxygen Delivery Method: Simple face mask Placement Confirmation: positive ETCO2 and breath sounds checked- equal and bilateral Dental Injury: Teeth and Oropharynx as per pre-operative assessment

## 2021-06-17 NOTE — Progress Notes (Signed)
PROGRESS NOTE    Kim Ross  RUE:[id number] DOB: 1956-09-25 DOA: 06/14/2021 PCP: Erskine Emery, MD   Brief Narrative: Kim Ross is a 65 y.o. female with a history of polysubstance abuse, aspiration pneumonitis, diabetes mellitus type 2, hyperlipidemia, anxiety. Patient presented secondary to post-obstructive pneumonia in setting of left lung mass concerning for malignancy. Empiric Ceftriaxone initiated. Biopsy performed on 9/14.   Assessment & Plan:   Principal Problem:   Lobar pneumonia ([place]) Active Problems:   Generalized anxiety disorder   Hyperlipidemia   Lung mass   Diabetes ([place])   Tobacco abuse   Malnutrition of moderate degree   Post-obstructive pneumonia Associated leukocytosis. No blood cultures obtained on admission. Started on empiric Ceftriaxone and Azithromycin and transitioned to Ceftriaxone monotherapy. Continues to have persistently elevated WBC. -Continue Ceftriaxone IV -Flutter valve -CBC in AM  Left lung mass Pulmonology consulted for bronchoscopy/EUS which was performed 9/14. Biopsies obtained. -Pulmonology recommendations as mentioned above  Diabetes mellitus, type 2 Patient is on Metformin as an outpatient. Hemoglobin A1C of 6.3%. -Carb modified diet  Hyperlipidemia -Continue home Lipitor 40 mg daily  Moderate malnutrition -Continue feeding supplementation, multivitamin  Tobacco use Counseled on admission.   Depression -Continue home Zoloft 50 mg daily  Hyponatremia Mild.   DVT prophylaxis: Lovenox Code Status:   Code Status: Full Code Family Communication: None at bedside Disposition Plan: Discharge home likely in 1-2 days pending transition to oral antibiotics and improvement of leukocytosis   Consultants:  Pulmonology (9/12)  Procedures:  EUS  Antimicrobials: Ceftriaxone Azithromycin    Subjective: Continues to have coughing with productive sputum.  Objective: Vitals:   06/16/21 0424 06/16/21 1357  06/16/21 2021 06/17/21 0516  BP: 124/66 112/60 106/69 122/65  Pulse: (!) 102 (!) 104 (!) 104 99  Resp: 18 15 20 17   Temp: 97.9 F (36.6 C) 98.2 F (36.8 C) 98.7 F (37.1 C) 98.4 F (36.9 C)  TempSrc: Oral Oral Oral Oral  SpO2: 94% 96% 100% 100%  Weight:      Height:        Intake/Output Summary (Last 24 hours) at 06/17/2021 0742 Last data filed at 06/17/2021 1478 Gross per 24 hour  Intake --  Output 400 ml  Net -400 ml   Filed Weights   06/15/21 0029  Weight: 61.2 kg    Examination:  General exam: Appears calm and comfortable  Respiratory system: Bibasilar rales moderately worse on left compared to right. Respiratory effort normal. Cardiovascular system: S1 & S2 heard, RRR. No murmurs, rubs, gallops or clicks. Gastrointestinal system: Abdomen is nondistended, soft and nontender. No organomegaly or masses felt. Normal bowel sounds heard. Central nervous system: Alert and oriented. No focal neurological deficits. Musculoskeletal: No edema. No calf tenderness Skin: No cyanosis. No rashes Psychiatry: Judgement and insight appear normal. Mood & affect appropriate.     Data Reviewed: I have personally reviewed following labs and imaging studies  CBC Lab Results  Component Value Date   WBC 22.2 (H) 06/16/2021   RBC 3.88 06/16/2021   HGB 10.9 (L) 06/16/2021   HCT 33.4 (L) 06/16/2021   MCV 86.1 06/16/2021   MCH 28.1 06/16/2021   PLT 513 (H) 06/16/2021   MCHC 32.6 06/16/2021   RDW 13.8 06/16/2021   LYMPHSABS 2.1 06/14/2021   MONOABS 1.4 (H) 06/14/2021   EOSABS 0.1 06/14/2021   BASOSABS 0.1 29/56/2130     Last metabolic panel Lab Results  Component Value Date   NA 132 (L) 06/16/2021   K  3.5 06/16/2021   CL 99 06/16/2021   CO2 25 06/16/2021   BUN 8 06/16/2021   CREATININE 0.55 06/16/2021   GLUCOSE 143 (H) 06/16/2021   GFRNONAA >60 06/16/2021   GFRAA 76 11/28/2020   CALCIUM 9.3 06/16/2021   PROT 6.7 06/15/2021   ALBUMIN 2.6 (L) 06/15/2021   LABGLOB 3.2  04/29/2021   AGRATIO 1.3 04/29/2021   BILITOT 0.6 06/15/2021   ALKPHOS 115 06/15/2021   AST 15 06/15/2021   ALT 14 06/15/2021   ANIONGAP 8 06/16/2021    CBG (last 3)  Recent Labs    06/15/21 0834 06/15/21 1216 06/15/21 1639  GLUCAP 87 95 102*     GFR: Estimated Creatinine Clearance: 64.8 mL/min (by C-G formula based on SCr of 0.55 mg/dL).  Coagulation Profile: No results for input(s): INR, PROTIME in the last 168 hours.  No results found for this or any previous visit (from the past 240 hour(s)).      Radiology Studies: No results found.      Scheduled Meds:  atorvastatin  40 mg Oral Daily   feeding supplement  237 mL Oral BID BM   fluticasone  1 spray Each Nare Daily   guaiFENesin  600 mg Oral BID   meclizine  25 mg Oral BID   mouth rinse  15 mL Mouth Rinse BID   multivitamin with minerals  1 tablet Oral QODAY   sertraline  50 mg Oral Daily   Continuous Infusions:  cefTRIAXone (ROCEPHIN)  IV 1 g (06/16/21 2246)     LOS: 3 days     Cordelia Poche, MD [facility] 06/17/2021, 7:42 AM  If 7PM-7AM, please contact night-coverage [email or web address]

## 2021-06-17 NOTE — Anesthesia Procedure Notes (Signed)
Procedure Name: Intubation Date/Time: 06/17/2021 10:12 AM Performed by: Cynda Familia, CRNA Pre-anesthesia Checklist: Patient identified, Emergency Drugs available, Suction available and Patient being monitored Patient Re-evaluated:Patient Re-evaluated prior to induction Oxygen Delivery Method: Circle System Utilized Preoxygenation: Pre-oxygenation with 100% oxygen Induction Type: IV induction Ventilation: Mask ventilation without difficulty Laryngoscope Size: 2 and Miller Grade View: Grade I Tube type: Oral Tube size: 8.5 (large ETT as per MD request) mm Number of attempts: 1 Airway Equipment and Method: Stylet Placement Confirmation: ETT inserted through vocal cords under direct vision, positive ETCO2 and breath sounds checked- equal and bilateral Secured at: 22 cm Tube secured with: Tape Dental Injury: Teeth and Oropharynx as per pre-operative assessment  Comments: Smooth IV induction Houser- intubation AM CRNA atraumatic-- teeth and mouth as preop- many missing teeth- bilat BS Houser

## 2021-06-17 NOTE — Transfer of Care (Signed)
Immediate Anesthesia Transfer of Care Note  Patient: Kim Ross  Procedure(s) Performed: ENDOBRONCHIAL ULTRASOUND (Left) BRONCHIAL NEEDLE ASPIRATION BIOPSIES  Patient Location: PACU and Endoscopy Unit  Anesthesia Type:General  Level of Consciousness: sedated  Airway & Oxygen Therapy: Patient Spontanous Breathing and Patient connected to face mask oxygen  Post-op Assessment: Report given to RN and Post -op Vital signs reviewed and stable  Post vital signs: Reviewed and stable  Last Vitals:  Vitals Value Taken Time  BP 131/56 06/17/21 1158  Temp    Pulse 114 06/17/21 1200  Resp 26 06/17/21 1200  SpO2 99 % 06/17/21 1200  Vitals shown include unvalidated device data.  Last Pain:  Vitals:   06/17/21 0903  TempSrc: Oral  PainSc: 10-Worst pain ever      Patients Stated Pain Goal: 0 (41/28/20 8138)  Complications: No notable events documented.

## 2021-06-17 NOTE — Op Note (Signed)
Flexible and EBUS Bronchoscopy Procedure Note  Kim Ross  [id number]  1956/01/02  Date:06/17/21  Time:11:45 AM   Provider Performing:Kaylea Mounsey M Verlee Monte   Procedure: Flexible bronchoscopy and EBUS Bronchoscopy  Indication(s) Left lung masses Left upper lobe ground glass nodule Mediastinal LAD  Consent Risks of the procedure as well as the alternatives and risks of each were explained to the patient and/or caregiver.  Consent for the procedure was obtained.  Anesthesia General Anesthesia   Time Out Verified patient identification, verified procedure, site/side was marked, verified correct patient position, special equipment/implants available, medications/allergies/relevant history reviewed, required imaging and test results available.   Sterile Technique Usual hand hygiene, masks, gowns, and gloves were used   Procedure Description Diagnostic bronchoscope advanced through endotracheal tube and into airway.  Airways were examined down to subsegmental level with findings noted below.  The diagnostic bronchoscope was then removed and the EBUS bronchoscope was advanced into airway with stations 11R, 7S and 4R biopsied and sent for slide, cell block, and/or culture.  The EBUS bronchoscope was removed after assuring no active bleeding from biopsy site.  Findings:  - abnormal edematous and slightly hyperemic mucosa overlying LLL and distal left mainstem airways - mucoid secretions in LLL dependent airways which were easily suctioned -59m 11R, 323m7S, and 1541mR lymph nodes visualized and sampled under EBUS   Complications/Tolerance None; patient tolerated the procedure well. Chest X-ray is not needed post procedure.   EBL Minimal   Specimen(s) EBNA sent for cytology from 11R, 7S, and 4R lymph nodes

## 2021-06-17 NOTE — Anesthesia Postprocedure Evaluation (Signed)
Anesthesia Post Note  Patient: Kim Ross  Procedure(s) Performed: ENDOBRONCHIAL ULTRASOUND (Left) BRONCHIAL NEEDLE ASPIRATION BIOPSIES     Patient location during evaluation: PACU Anesthesia Type: General Level of consciousness: awake and alert Pain management: pain level controlled Vital Signs Assessment: post-procedure vital signs reviewed and stable Respiratory status: spontaneous breathing, nonlabored ventilation, respiratory function stable and patient connected to nasal cannula oxygen Cardiovascular status: blood pressure returned to baseline and stable Postop Assessment: no apparent nausea or vomiting Anesthetic complications: no   No notable events documented.  Last Vitals:  Vitals:   06/17/21 1210 06/17/21 1221  BP: 130/65 (!) 122/47  Pulse: (!) 111 (!) 113  Resp: 19 (!) 24  Temp:    SpO2: 98% 90%    Last Pain:  Vitals:   06/17/21 1200  TempSrc: Axillary  PainSc:                  Barnet Glasgow

## 2021-06-17 NOTE — Anesthesia Postprocedure Evaluation (Signed)
Anesthesia Post Note  Patient: Kim Ross  Procedure(s) Performed: ENDOBRONCHIAL ULTRASOUND (Left) BRONCHIAL NEEDLE ASPIRATION BIOPSIES     Patient location during evaluation: Endoscopy Anesthesia Type: General Level of consciousness: awake and alert Pain management: pain level controlled Vital Signs Assessment: post-procedure vital signs reviewed and stable Respiratory status: spontaneous breathing, nonlabored ventilation, respiratory function stable and patient connected to nasal cannula oxygen Cardiovascular status: blood pressure returned to baseline and stable Postop Assessment: no apparent nausea or vomiting Anesthetic complications: no   No notable events documented.  Last Vitals:  Vitals:   06/17/21 1210 06/17/21 1221  BP: 130/65 (!) 122/47  Pulse: (!) 111 (!) 113  Resp: 19 (!) 24  Temp:    SpO2: 98% 90%    Last Pain:  Vitals:   06/17/21 1200  TempSrc: Axillary  PainSc:                  Barnet Glasgow

## 2021-06-18 ENCOUNTER — Telehealth: Payer: Self-pay | Admitting: *Deleted

## 2021-06-18 ENCOUNTER — Encounter (HOSPITAL_COMMUNITY): Payer: Self-pay | Admitting: Student

## 2021-06-18 DIAGNOSIS — J181 Lobar pneumonia, unspecified organism: Secondary | ICD-10-CM | POA: Diagnosis not present

## 2021-06-18 LAB — CBC
HCT: 32.6 % — ABNORMAL LOW (ref 36.0–46.0)
Hemoglobin: 10.7 g/dL — ABNORMAL LOW (ref 12.0–15.0)
MCH: 28.5 pg (ref 26.0–34.0)
MCHC: 32.8 g/dL (ref 30.0–36.0)
MCV: 86.7 fL (ref 80.0–100.0)
Platelets: 520 10*3/uL — ABNORMAL HIGH (ref 150–400)
RBC: 3.76 MIL/uL — ABNORMAL LOW (ref 3.87–5.11)
RDW: 14.1 % (ref 11.5–15.5)
WBC: 21.8 10*3/uL — ABNORMAL HIGH (ref 4.0–10.5)
nRBC: 0 % (ref 0.0–0.2)

## 2021-06-18 MED ORDER — ENSURE ENLIVE PO LIQD: 237.0000 mL | Freq: Two times a day (BID) | ORAL | Status: DC

## 2021-06-18 MED ORDER — GUAIFENESIN ER 600 MG PO TB12: 600.0000 mg | ORAL_TABLET | Freq: Two times a day (BID) | ORAL | 0 refills | Status: AC

## 2021-06-18 MED ORDER — CEFDINIR 300 MG PO CAPS: 300.0000 mg | ORAL_CAPSULE | Freq: Two times a day (BID) | ORAL | 0 refills | Status: AC

## 2021-06-18 NOTE — Discharge Instructions (Signed)
[place],  You were in the hospital with a possible pneumonia from a blockage in your lung by a mass. Your mass was biopsied. I have discussed your care with the oncologist who will follow-up with you in the office this Monday or Tuesday (either Dr. Burr Medico or Dr. Julien Nordmann) but please also follow-up with your primary care physician as well. I will give you some antibiotics to complete a total of 7 days of antibiotics. Please continue to use your flutter valve.

## 2021-06-18 NOTE — Progress Notes (Signed)
   NAME:  Kim Ross, MRN:  [id number], DOB:  01-May-1956, LOS: 4 ADMISSION DATE:  06/14/2021, CONSULTATION DATE:  06/15/21 REFERRING MD:  Cathlean Sauer, CHIEF COMPLAINT:  chest pain   History of Present Illness:  65yF with history of PSA, DM2, smoking, emphysema, and recently discovered mediastinal LAD and left lung masses who was originally planned for EBUS/navigation but had case postponed due to covid-19 infection. Developed productive cough at home, was started on doxy and then had persistent severe chest pain that prompted her to come to [facility] ED 9/11 where she was admitted for presumed post obstructive pneumonia. Still has productive cough, chest pain which opioids relieves somewhat, DOE.  Pertinent  Medical History  Smoking Emphysema Left lung masses and mediastinal LAD DM2  Significant Hospital Events: Including procedures, antibiotic start and stop dates in addition to other pertinent events   9/11 admitted   Interim History / Subjective:    Underwent staging EBUS yesterday. On my way into the patient's room yesterday after the procedure, the husband asked if I could step outside the patient's room to talk with him before speaking with the patient. He immediately said that he did not want me to disclose any results to her if they indicated she had cancer out of concern that it would make her more depressed. I told him that I don't share results of preliminary impressions from on site cytology in general and that we would need to wait anyway until final pathology review in 3-5 business days. I said that this did likely represent cancer and that they should be prepared for that news. I said that some patients prefer to find out news like that in oncology clinic rather than over the phone so that it's easier to talk about potential treatments/implications - he and the patient both agreed that this approach was acceptable to them.     Objective   Blood pressure 132/80, pulse (!) 110, temperature  98.1 F (36.7 C), temperature source Oral, resp. rate 17, height 5\' 5"  (1.651 m), weight 61.2 kg, SpO2 94 %.        Intake/Output Summary (Last 24 hours) at 06/18/2021 1000 Last data filed at 06/17/2021 4665 Gross per 24 hour  Intake 1060 ml  Output 405 ml  Net 655 ml   Filed Weights   06/15/21 0029 06/17/21 0903  Weight: 61.2 kg 61.2 kg    Examination: General appearance: chronically ill appearing 65 y.o., female, thin, NAD HENT: NCAT; dry MM Neck: no palpable LAD, JVD Lungs: rhonchi left base, diminished b/l, normal effort CV: tachycardic, RR, no murmur Abdomen: soft, NT, ND, +bowel sounds Extremities: no edema, legs are warm Psych: Appropriate affect Neuro: A/Ox4, no focal deficit   EBNA cytology pending   Resolved Hospital Problem list     Assessment & Plan:   Left lung masses Mediastinal LAD LUL GG nodule Possible post-obstructive pneumonia - EBNA cytology collected 9/14 from 11R, 7S, and 4R pending  - patient and husband expressed preference for having results disclosed by oncology either in the hospital or in clinic if this is feasible so that overview of potential treatment options could be readily discussed  Smoking - cessation encouraged  Best Practice (right click and "Reselect all SmartList Selections" daily)   Per [facility]  Will sign off but glad to be reinvolved as condition changes.  [place]

## 2021-06-18 NOTE — Telephone Encounter (Signed)
I received referral on Ms. Meininger. I called to schedule. I was unable to reach but did leave vm message with my name and phone number to call.

## 2021-06-18 NOTE — Discharge Summary (Signed)
Physician Discharge Summary  Kim Ross EUM:[id number] DOB: 1955-11-17 DOA: 06/14/2021  PCP: Erskine Emery, MD  Admit date: 06/14/2021 Discharge date: 06/18/2021  Admitted From: Home Disposition: Home  Recommendations for Outpatient Follow-up:  Follow up with PCP in 1 week Follow up with oncology on Monday or Tuesday to discuss results; patient does not want to receive results via telephone Repeat CBC in 5-7 days Please follow up on the following pending results: Biopsy results (do not call patient with results; patient requests in person visit to discuss biopsy results)  Home Health: None Equipment/Devices: None  Discharge Condition: Stable CODE STATUS: Full code Diet recommendation: Carb modified diet   Brief/Interim Summary:  Admission HPI written by Elwyn Reach, MD   HPI: Kim Ross is a 65 y.o. female with medical history significant of polysubstance abuse, aspiration pneumonitis, history of diabetes, hyperlipidemia, medication noncompliance, anxiety disorder, cocaine abuse, recurrent UTIs who was recently diagnosed with lung masses suspicious for lung malignancy.  Patient was scheduled for biopsy but then was positive for COVID-19 about 2 weeks ago.  This was postponed.  Since then she is showed some decline at home with worsening weakness, cough, malaise and some fever.  Was brought in today by her husband.  Patient is meeting SIRS criteria.  Had leukocytosis among other things.  Not sure if she has postobstructive pneumonia.  At this point she is failing to thrive at home.  With her ongoing symptoms patient will be admitted to the hospital for further evaluation and treatment.   Hospital course:  Post-obstructive pneumonia Associated leukocytosis. No blood cultures obtained on admission. Started on empiric Ceftriaxone and Azithromycin and transitioned to Ceftriaxone monotherapy. Continues to have persistently elevated WBC which have slightly improved. Possibly  related to underlying probable malignancy. No fevers and overall clinically is improved. Transitioned to Cefdinir on discharge to complete a 7 day course of antibiotics.   Left lung mass Pulmonology consulted for bronchoscopy/EUS which was performed 9/14. Biopsies obtained and are pending at time of discharge. Patient to follow-up with oncology for results.   Diabetes mellitus, type 2 Patient is on Metformin as an outpatient. Hemoglobin A1C of 6.3%. Carb modified diet. Continue metformin.   Hyperlipidemia Continue home Lipitor 40 mg daily   Moderate malnutrition Continue feeding supplementation, multivitamin   Tobacco use Counseled on admission.   Depression Continue home Zoloft 50 mg daily   Hyponatremia Mild. Stable. Asymptomatic.  Polysubstance abuse history No UDS on admission. Noted.  Discharge Diagnoses:  Principal Problem:   Lobar pneumonia ([place]) Active Problems:   Generalized anxiety disorder   Hyperlipidemia   Lung mass   Diabetes ([place])   Tobacco abuse   Malnutrition of moderate degree    Discharge Instructions   Allergies as of 06/18/2021       Reactions   Ibuprofen Nausea And Vomiting   Trazodone And Nefazodone    AM Dizziness & Drowsiness         Medication List     STOP taking these medications    Abdominal Binder/Elastic Large Misc   doxycycline 100 MG tablet Commonly known as: VIBRA-TABS   hydrOXYzine 10 MG tablet Commonly known as: ATARAX/VISTARIL   terbinafine 250 MG tablet Commonly known as: LAMISIL       TAKE these medications    atorvastatin 40 MG tablet Commonly known as: LIPITOR TAKE 1 TABLET BY MOUTH EVERY DAY   cefdinir 300 MG capsule Commonly known as: OMNICEF Take 1 capsule (300 mg total) by  mouth 2 (two) times daily for 3 days.   feeding supplement Liqd Take 237 mLs by mouth 2 (two) times daily between meals.   fluticasone 50 MCG/ACT nasal spray Commonly known as: FLONASE Place 1 spray into both nostrils  daily. 1 spray in each nostril every day   guaiFENesin 600 MG 12 hr tablet Commonly known as: MUCINEX Take 1 tablet (600 mg total) by mouth 2 (two) times daily for 7 days.   HYDROcodone-acetaminophen 5-325 MG tablet Commonly known as: NORCO/VICODIN Take 1 tablet by mouth every 6 (six) hours as needed for moderate pain.   meclizine 25 MG tablet Commonly known as: ANTIVERT TAKE 1 TABLET BY MOUTH TWICE A DAY   metFORMIN 500 MG tablet Commonly known as: GLUCOPHAGE TAKE 1 TABLET BY MOUTH 2 TIMES DAILY WITH A MEAL. What changed:  how much to take when to take this   multivitamin with minerals tablet Take 1 tablet by mouth daily. What changed: when to take this   sertraline 50 MG tablet Commonly known as: ZOLOFT TAKE 1 TABLET BY MOUTH EVERY DAY   traMADol 50 MG tablet Commonly known as: ULTRAM Take 1-2 tablets as needed every 6 hours for pain   triamcinolone ointment 0.5 % Commonly known as: KENALOG Apply 1 application topically 2 (two) times daily. For moderate to severe eczema.  Do not use for more than 1 week at a time. What changed: Another medication with the same name was removed. Continue taking this medication, and follow the directions you see here.        Follow-up Information     Truitt Merle, MD Follow up.   Specialties: Hematology, Oncology Why: You will be called for an appointment on either this upcoming Monday or Tuesday with either Dr. Julien Nordmann or Dr. Burr Medico for results of biopsy and possible development of treatment plan. Contact information: [place] [address] [phone]         Curt Bears, MD Follow up.   Specialty: Oncology Why: You will be called for an appointment on either this upcoming Monday or Tuesday with either Dr. Julien Nordmann or Dr. Burr Medico for results of biopsy and possible development of treatment plan. Contact information: [place] [address] [phone]         Erskine Emery, MD. Schedule an appointment as soon as possible for a visit in 1 week(s).   Specialty: Family Medicine Why: For hospital follow-up Contact information: [place] [address] [phone]                Allergies  Allergen Reactions   Ibuprofen Nausea And Vomiting   Trazodone And Nefazodone     AM Dizziness & Drowsiness     Consultations: Pulmonology   Procedures/Studies: CT Angio Chest PE W and/or Wo Contrast  Result Date: 06/14/2021 CLINICAL DATA:  Lung cancer.  Concern for pulmonary embolism. EXAM: CT ANGIOGRAPHY CHEST WITH CONTRAST TECHNIQUE: Multidetector CT imaging of the chest was performed using the standard protocol during bolus administration of intravenous contrast. Multiplanar CT image reconstructions and MIPs were obtained to evaluate the vascular anatomy. CONTRAST:  31mL OMNIPAQUE IOHEXOL 350 MG/ML SOLN COMPARISON:  Chest CT dated 05/13/2021. FINDINGS: Cardiovascular: There is no cardiomegaly or pericardial effusion. Mild atherosclerotic calcification of the thoracic aorta. No aneurysmal dilatation or dissection. Evaluation of the pulmonary arteries is limited due to respiratory motion artifact. No pulmonary artery embolus identified Mediastinum/Nodes: Significant interval enlargement of the anterior mediastinal mass extending into  the aortopulmonic window measuring 4.7 x 5.0 cm (previously 3.3 x 3.2 cm). This mass abuts the left main pulmonary artery and anterior aspect of the aortic arch. Subcarinal adenopathy measures 15 mm in short axis (previously 12 mm. No mediastinal fluid collection. Lungs/Pleura: Large left lung mass primarily in the left lower lobe and extending across the pleural into the lingula. This mass measures approximately 7.5 x 10.0 cm in greatest axial dimensions and 9 cm in craniocaudal length. This has significantly increased in size since the prior CT (previously measures proximally 4 x 7 cm in greatest axial dimensions and 5 cm  in craniocaudal length). This mass extends into the left hilum. Left apical nodule with spiculated margins measures 2.8 x 2.0 cm similar to prior CT. A 5 mm nodule in the right middle lobe similar to prior CT, likely metastasis. Additional 5 mm nodule in the right upper lobe and a 9 x 14 mm area of ground-glass density in the left upper lobe appears similar to prior CT. There is a small left pleural effusion, likely malignant effusion and new since the prior CT. No pneumothorax. The central airways remain patent. There is however narrowing of the left lower lobe and lingular bronchi. Upper Abdomen: No acute abnormality. Musculoskeletal: Mild degenerative changes of the spine. No acute osseous pathology. Review of the MIP images confirms the above findings. IMPRESSION: 1. No CT evidence of pulmonary embolism. 2. Significant interval progression of malignancy with enlargement of the left lung and mediastinal masses. 3. Small left pleural effusion, likely malignant effusion, new since the prior CT. 4. Aortic Atherosclerosis (ICD10-I70.0). Electronically Signed   By: Anner Crete M.D.   On: 06/14/2021 22:26   DG Chest Portable 1 View  Result Date: 06/14/2021 CLINICAL DATA:  Chest pain and shortness of breath. EXAM: PORTABLE CHEST 1 VIEW COMPARISON:  May 01, 2021 FINDINGS: The heart size and mediastinal contours are within normal limits. There are 2 masses in the left lung. The mass in the left mid to lower lung is significantly enlarged compared prior exam currently measuring 10 x 9.6 cm. The mass in the left apex is unchanged. There is no focal infiltrate, pulmonary edema, or pleural effusion. The visualized skeletal structures are stable. IMPRESSION: 2 masses in the left lung. The mass in the left mid to lower lung is significantly enlarged compared prior exam currently measuring 10 x 9.6 cm. Findings are suspicious for malignant neoplasm. No focal pneumonia or pulmonary edema. Electronically Signed   By:  Abelardo Diesel M.D.   On: 06/14/2021 14:53      Subjective: No issues overnight. Cough. No chest pain or dyspnea.  Discharge Exam: Vitals:   06/17/21 2100 06/18/21 0618  BP: 119/64 132/80  Pulse: (!) 110 (!) 110  Resp: 18 17  Temp: 99.1 F (37.3 C) 98.1 F (36.7 C)  SpO2: 92% 94%   Vitals:   06/17/21 1221 06/17/21 1340 06/17/21 2100 06/18/21 0618  BP: (!) 122/47 123/64 119/64 132/80  Pulse: (!) 113 (!) 110 (!) 110 (!) 110  Resp: (!) 24 15 18 17   Temp:  (!) 97.5 F (36.4 C) 99.1 F (37.3 C) 98.1 F (36.7 C)  TempSrc:  Oral Oral Oral  SpO2: 90% 94% 92% 94%  Weight:      Height:        General: Pt is alert, awake, not in acute distress Cardiovascular: RRR, S1/S2 +, no rubs, no gallops Respiratory: Rales, no wheezing Abdominal: Soft, NT, ND, bowel sounds + Extremities: no  edema, no cyanosis    The results of significant diagnostics from this hospitalization (including imaging, microbiology, ancillary and laboratory) are listed below for reference.     Labs:  Basic Metabolic Panel: Recent Labs  Lab 06/14/21 1855 06/15/21 0117 06/16/21 0432  NA 134* 132* 132*  K 4.2 4.2 3.5  CL 100 98 99  CO2 23 23 25   GLUCOSE 103* 104* 143*  BUN 17 13 8   CREATININE 0.64 0.69 0.55  CALCIUM 9.8 9.6 9.3   Liver Function Tests: Recent Labs  Lab 06/14/21 1855 06/15/21 0117  AST 18 15  ALT 16 14  ALKPHOS 139* 115  BILITOT 0.7 0.6  PROT 7.7 6.7  ALBUMIN 2.8* 2.6*   CBC: Recent Labs  Lab 06/14/21 1855 06/15/21 0117 06/16/21 0432 06/18/21 0529  WBC 23.0* 24.0* 22.2* 21.8*  NEUTROABS 19.2*  --   --   --   HGB 12.5 11.2* 10.9* 10.7*  HCT 38.8 33.8* 33.4* 32.6*  MCV 87.0 86.4 86.1 86.7  PLT 537* 572* 513* 520*   CBG: Recent Labs  Lab 06/15/21 0035 06/15/21 0834 06/15/21 1216 06/15/21 1639 06/17/21 1157  GLUCAP 87 87 95 102* 116*     Time coordinating discharge: 35 minutes  SIGNED:   Cordelia Poche, MD [facility] 06/18/2021, 1:54 PM

## 2021-06-19 ENCOUNTER — Telehealth: Payer: Self-pay | Admitting: *Deleted

## 2021-06-19 DIAGNOSIS — R918 Other nonspecific abnormal finding of lung field: Secondary | ICD-10-CM

## 2021-06-19 NOTE — Telephone Encounter (Signed)
I called Ms. Wolken to schedule her. I was unable to reach but did leave vm message with my name and phone number to call.

## 2021-06-22 ENCOUNTER — Ambulatory Visit (HOSPITAL_COMMUNITY): Admission: RE | Admit: 2021-06-22 | Payer: 59 | Source: Home / Self Care | Admitting: Emergency Medicine

## 2021-06-22 ENCOUNTER — Telehealth: Payer: Self-pay | Admitting: Emergency Medicine

## 2021-06-22 ENCOUNTER — Encounter (HOSPITAL_COMMUNITY): Admission: RE | Payer: Self-pay | Source: Home / Self Care

## 2021-06-22 DIAGNOSIS — C3490 Malignant neoplasm of unspecified part of unspecified bronchus or lung: Secondary | ICD-10-CM

## 2021-06-22 SURGERY — VIDEO BRONCHOSCOPY WITH ENDOBRONCHIAL NAVIGATION
Anesthesia: General

## 2021-06-22 NOTE — Telephone Encounter (Signed)
Dr. Lamonte Sakai, please advise if any results from pt's recent bronch have come back as pt is calling.

## 2021-06-22 NOTE — Telephone Encounter (Signed)
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  Immunohistochemistry still pending.  I called the patient to review this with her.  I will also refer her to [facility] to get therapy initiated soon as possible.  PET scan has already been ordered.  She will need an MRI brain as well.  FYI to Dr Verlee Monte

## 2021-06-23 ENCOUNTER — Telehealth: Payer: Self-pay | Admitting: Emergency Medicine

## 2021-06-23 LAB — CYTOLOGY - NON PAP

## 2021-06-23 MED ORDER — HYDROCODONE-ACETAMINOPHEN 5-325 MG PO TABS: 1.0000 | ORAL_TABLET | Freq: Four times a day (QID) | ORAL | 0 refills | Status: DC | PRN

## 2021-06-23 NOTE — Telephone Encounter (Signed)
I have got the MRI of the brain scheduled for 07/02/21 arrive at 12:30 @ [facility] no prep

## 2021-06-23 NOTE — Addendum Note (Signed)
Addended by: Collene Gobble on: 06/23/2021 04:05 PM   Modules accepted: Orders

## 2021-06-23 NOTE — Telephone Encounter (Signed)
I called Patient to make sure she was aware of MRI date and time.  Patient denies knowing anything about MRI or oncology appointments.  Advised Patient she has oncology appointment 06/24/21 at 1:30pm and MRI scheduled 07/02/21 at 1230 at [facility].  Patient does not feel a MRI is needed. Advised Dr. Lamonte Sakai felt she needed a MRI and it would be beneficial for her treatment. Understanding stated. Patient requested pain medication.  Patient stated she is in excruciating pain.  Patient stated she is having severe leg pain and it is almost unable to walk at this time. Patient stated she used [facility].  Message routed to Dr. Lamonte Sakai

## 2021-06-23 NOTE — Telephone Encounter (Signed)
Please inform pt that going forward she will need to get her pain medication from either her PCP or the cancer physicians that she is going to meet this week. I will not be able to continue to fill this.   I have sent a small refill to her pharmacy to tide her over until she can discuss with her long-term caregivers.

## 2021-06-23 NOTE — Progress Notes (Signed)
[facility] [phone]   Fax:([phone]  CONSULT NOTE  REFERRING PHYSICIAN: Dr. Delton Coombes MD  REASON FOR CONSULTATION:  Non-Small Cell Lung Cancer   HPI Kim Ross is a 65 y.o. female with a past medical history significant for tobacco abuse, depression, questionable substance abuse, hyperlipidemia, and urinary tract infection is referred to the clinic for newly diagnosed lung cancer.  The patient presented to the emergency room at [facility] on 05/01/2021 for the chief complaint of chest discomfort which she localizes to the left upper chest. This radiated to her back and rib cage. She also had associated shortness of breath and occasional cough for the last 4 days.  The patient had a chest x-ray performed which showed 2 new left-sided pulmonary masses with increased nodularity along the left hilar border. Radiology recommended further evaluation by dedicated CT scan.   Her primary care provider ordered a stat CT scan the chest which was performed on 05/13/2021 and it visualized a 6.8 cm left lower lobe mass with suspected metastatic 3.0 cm left upper lobe mass and substantial AP window, left infrahilar, mild retrocrural adenopathy.  By imaging this represented at least T4, N2 disease (stage IIIb), however, there is also a small 5 x 7 mm right upper lobe nodule and multiple foci of subcentimeter nodules in the lungs which is suspicious for multifocal adenocarcinoma and/or additional sites of malignancy.  There was also 2 mildly enhancing lesions in the dome of the liver probably representing benign lesion but it is nonspecific.   She then saw Dr. Delton Coombes on 05/19/2021.  Dr. Delton Coombes ordered a PET scan to complete the staging work-up.  The PET scan is not scheduled at this time.  Dr. Delton Coombes also arrange for navigational bronchoscopy.  Unfortunately, the patient contracted COVID-19, and her bronchoscopy needed to be rescheduled.    The patient presented to the emergency  room on 06/14/2021 for the chief complaint of chest pain, shortness of breath, fever, fatigue, and for testing positive for COVID-19 2 weeks prior.  She was supposed to have her rescheduled bronchoscopy on 06/15/2021.  She was admitted from 06/14/2021-06/18/2021 as she met SIRS criteria.  She had leukocytosis on labs. She was treated for postobstructive pneumonia.  During her admission, she received antibiotics.  She had her navigational bronchoscopy/EBUS performed on 06/17/2021. The pathology [id number]) shows malignant cells on the station 7 lymph node consistent with non-small cell lung cancer.  The 4R lymph node shows rare malignant cells and the 11 are lymph node shows no malignant cells.  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   She does not have enough tissue to send for PD-L1 and foundation 1 testing  The patient's staging brain MRI is scheduled for 07/02/2021.  Overall, the patient is feeling "bad" today.  Her main concern is related to pain.  She reports sternal pain that radiates through her chest to her back.  She rates her pain a 10 out of 10.  She characterizes her pain as a stabbing pain.  She has a prescription for Norco and tramadol which does not alleviate her pain.  On 1 occasion, the patient was given OxyContin by a friend of the husband.  The pain is interfering with her ability to sleep.  The patient reports shortness of breath with exertion.  The patient quit smoking approximately 2 weeks ago.  She has a cough which produces grey mucus.  She denies any hemoptysis.  She is going to start taking Mucinex.  She denies any headache or visual changes.  She denies any nausea, vomiting, diarrhea, or constipation.  She estimates that she lost approximately 30 pounds in the last 3 to 4 months.  The patient's family history consists of a sister who passed away due to pancreatic cancer and another sister who passed away secondary to lung cancer.  The patient's younger sister also passed  away secondary to cancer but she is unsure of the primary site.  The patient's father passed away secondary to complications from asthma.  The patient's father passed away due to natural causes and reportedly did not have any medical history except for glaucoma.  The patient used to work in home health.  She is married.  She has 4 adopted children the youngest of which is 89 and lives at home with the patient and her husband.  She denies any history of drug abuse.  Of note, the patient's chart shows history of polysubstance abuse.  She estimates that she smoked 44 years averaging 1 pack a day.  The patient denies any significant alcohol use at this time.  She estimates she has less than 1 drink a month.   HPI  Past Medical History:  Diagnosis Date   Acute pain of left shoulder 12/01/2020   Anxiety    Aspiration pneumonitis ([place]) 08/05/2019   Breast cancer screening 12/30/2017   Chest wall pain 11/05/2019   Cocaine abuse ([place])    Depression    Drug intoxication ([place]) 08/05/2019   Encounter for screening for HIV 08/05/2019   Goiter 09/05/2019   History of recurrent UTIs    No-show for appointment 09/24/2020   Pneumonia    Pre-diabetes    Urinary frequency 04/10/2013    Past Surgical History:  Procedure Laterality Date   ABDOMINAL HYSTERECTOMY     BRONCHIAL NEEDLE ASPIRATION BIOPSY  06/17/2021   Procedure: BRONCHIAL NEEDLE ASPIRATION BIOPSIES;  Surgeon: Maryjane Hurter, MD;  Location: [facility];  Service: Pulmonary;;   ENDOBRONCHIAL ULTRASOUND Left 06/17/2021   Procedure: ENDOBRONCHIAL ULTRASOUND;  Surgeon: Maryjane Hurter, MD;  Location: [facility];  Service: Pulmonary;  Laterality: Left;   HEMORRHOID SURGERY     VIDEO BRONCHOSCOPY  06/17/2021   Procedure: VIDEO BRONCHOSCOPY;  Surgeon: Maryjane Hurter, MD;  Location: [facility];  Service: Pulmonary;;    Family History  Problem Relation Age of Onset   Asthma Mother    Cancer Sister 3       Pancreatic    Social  History Social History   Tobacco Use   Smoking status: Some Days    Packs/day: 0.25    Types: Cigarettes   Smokeless tobacco: Never  Vaping Use   Vaping Use: Never used  Substance Use Topics   Alcohol use: Not Currently    Alcohol/week: 1.0 standard drink    Types: 1 Cans of beer per week   Drug use: No    Allergies  Allergen Reactions   Ibuprofen Nausea And Vomiting   Trazodone And Nefazodone     AM Dizziness & Drowsiness     Current Outpatient Medications  Medication Sig Dispense Refill   atorvastatin (LIPITOR) 40 MG tablet TAKE 1 TABLET BY MOUTH EVERY DAY 90 tablet 3   feeding supplement (ENSURE ENLIVE / ENSURE PLUS) LIQD Take 237 mLs by mouth 2 (two) times daily between meals.     fentaNYL (DURAGESIC) 25 MCG/HR Place 1 patch onto the skin every 3 (three) days. 5 patch 0   fluticasone (FLONASE) 50 MCG/ACT nasal  spray Place 1 spray into both nostrils daily. 1 spray in each nostril every day 16 g 12   guaiFENesin (MUCINEX) 600 MG 12 hr tablet Take 1 tablet (600 mg total) by mouth 2 (two) times daily for 7 days. 14 tablet 0   HYDROcodone-acetaminophen (NORCO/VICODIN) 5-325 MG tablet Take 1 tablet by mouth every 6 (six) hours as needed for moderate pain. 20 tablet 0   meclizine (ANTIVERT) 25 MG tablet TAKE 1 TABLET BY MOUTH TWICE A DAY 180 tablet 3   metFORMIN (GLUCOPHAGE) 500 MG tablet TAKE 1 TABLET BY MOUTH 2 TIMES DAILY WITH A MEAL. (Patient taking differently: Take 250 mg by mouth daily with breakfast.) 180 tablet 0   Multiple Vitamins-Minerals (MULTIVITAMIN WITH MINERALS) tablet Take 1 tablet by mouth daily. (Patient taking differently: Take 1 tablet by mouth every other day.) 1 tablet 11   sertraline (ZOLOFT) 50 MG tablet TAKE 1 TABLET BY MOUTH EVERY DAY 90 tablet 3   triamcinolone ointment (KENALOG) 0.5 % Apply 1 application topically 2 (two) times daily. For moderate to severe eczema.  Do not use for more than 1 week at a time. (Patient taking differently: Apply 1  application topically 2 (two) times daily. For moderate to severe eczema.  Do not use for more than 1 week at a time.) 60 g 3   No current facility-administered medications for this visit.    REVIEW OF SYSTEMS:   Review of Systems  Constitutional: Positive for fatigue, decreased appetite, weight loss.  Negative for chills and fever. HENT: Negative for mouth sores, nosebleeds, sore throat and trouble swallowing.   Eyes: Negative for eye problems and icterus.  Respiratory: Positive for cough and shortness of breath.  Negative for  hemoptysis and wheezing.   Cardiovascular: Positive for sternal chest pain.  Negative for leg swelling.  Gastrointestinal: Negative for abdominal pain, constipation, diarrhea, nausea and vomiting.  Genitourinary: Negative for bladder incontinence, difficulty urinating, dysuria, frequency and hematuria.   Musculoskeletal: Positive for back pain.  Negative for gait problem, neck pain and neck stiffness.  Skin: Negative for itching and rash.  Neurological: Negative for dizziness, extremity weakness, gait problem, headaches, light-headedness and seizures.  Hematological: Negative for adenopathy. Does not bruise/bleed easily.  Psychiatric/Behavioral: Negative for confusion, depression and sleep disturbance. The patient is not nervous/anxious.     PHYSICAL EXAMINATION:  Blood pressure 120/74, pulse (!) 104, temperature 97.6 F (36.4 C), temperature source Tympanic, resp. rate 16, height $RemoveBe'5\' 5"'GtgFDFHGN$  (1.651 m), weight 131 lb 14.4 oz (59.8 kg), SpO2 99 %.  ECOG PERFORMANCE STATUS: 1  Physical Exam  Constitutional: Oriented to person, place, and time and thin appearing female and in no distress.  HENT:  Head: Normocephalic and atraumatic.  Mouth/Throat: Oropharynx is clear and moist. No oropharyngeal exudate.  Eyes: Conjunctivae are normal. Right eye exhibits no discharge. Left eye exhibits no discharge. No scleral icterus.  Neck: Normal range of motion. Neck supple.   Cardiovascular: Normal rate, regular rhythm, normal heart sounds and intact distal pulses.   Pulmonary/Chest: Effort normal and breath sounds normal. No respiratory distress. No wheezes. No rales.  Abdominal: Soft. Bowel sounds are normal. Exhibits no distension and no mass. There is no tenderness.  Musculoskeletal: Normal range of motion. Exhibits no edema.  Lymphadenopathy:    No cervical adenopathy.  Neurological: Alert and oriented to person, place, and time. Exhibits muscle wasting.  The patient was examined in the wheelchair.  Skin: Skin is warm and dry. No rash noted. Not diaphoretic. No erythema.  No pallor.  Psychiatric: Mood, memory and judgment normal.  Vitals reviewed.  LABORATORY DATA: Lab Results  Component Value Date   WBC 23.6 (H) 06/24/2021   HGB 11.5 (L) 06/24/2021   HCT 36.1 06/24/2021   MCV 86.4 06/24/2021   PLT 466 (H) 06/24/2021      Chemistry      Component Value Date/Time   NA 134 (L) 06/24/2021 1226   NA 139 04/29/2021 0931   K 4.3 06/24/2021 1226   CL 100 06/24/2021 1226   CO2 23 06/24/2021 1226   BUN 11 06/24/2021 1226   BUN 13 04/29/2021 0931   CREATININE 0.72 06/24/2021 1226   CREATININE 0.81 09/30/2016 1651      Component Value Date/Time   CALCIUM 11.0 (H) 06/24/2021 1226   ALKPHOS 176 (H) 06/24/2021 1226   AST 14 (L) 06/24/2021 1226   ALT 12 06/24/2021 1226   BILITOT 0.5 06/24/2021 1226       RADIOGRAPHIC STUDIES: CT Angio Chest PE W and/or Wo Contrast  Result Date: 06/14/2021 CLINICAL DATA:  Lung cancer.  Concern for pulmonary embolism. EXAM: CT ANGIOGRAPHY CHEST WITH CONTRAST TECHNIQUE: Multidetector CT imaging of the chest was performed using the standard protocol during bolus administration of intravenous contrast. Multiplanar CT image reconstructions and MIPs were obtained to evaluate the vascular anatomy. CONTRAST:  76mL OMNIPAQUE IOHEXOL 350 MG/ML SOLN COMPARISON:  Chest CT dated 05/13/2021. FINDINGS: Cardiovascular: There is no  cardiomegaly or pericardial effusion. Mild atherosclerotic calcification of the thoracic aorta. No aneurysmal dilatation or dissection. Evaluation of the pulmonary arteries is limited due to respiratory motion artifact. No pulmonary artery embolus identified Mediastinum/Nodes: Significant interval enlargement of the anterior mediastinal mass extending into the aortopulmonic window measuring 4.7 x 5.0 cm (previously 3.3 x 3.2 cm). This mass abuts the left main pulmonary artery and anterior aspect of the aortic arch. Subcarinal adenopathy measures 15 mm in short axis (previously 12 mm. No mediastinal fluid collection. Lungs/Pleura: Large left lung mass primarily in the left lower lobe and extending across the pleural into the lingula. This mass measures approximately 7.5 x 10.0 cm in greatest axial dimensions and 9 cm in craniocaudal length. This has significantly increased in size since the prior CT (previously measures proximally 4 x 7 cm in greatest axial dimensions and 5 cm in craniocaudal length). This mass extends into the left hilum. Left apical nodule with spiculated margins measures 2.8 x 2.0 cm similar to prior CT. A 5 mm nodule in the right middle lobe similar to prior CT, likely metastasis. Additional 5 mm nodule in the right upper lobe and a 9 x 14 mm area of ground-glass density in the left upper lobe appears similar to prior CT. There is a small left pleural effusion, likely malignant effusion and new since the prior CT. No pneumothorax. The central airways remain patent. There is however narrowing of the left lower lobe and lingular bronchi. Upper Abdomen: No acute abnormality. Musculoskeletal: Mild degenerative changes of the spine. No acute osseous pathology. Review of the MIP images confirms the above findings. IMPRESSION: 1. No CT evidence of pulmonary embolism. 2. Significant interval progression of malignancy with enlargement of the left lung and mediastinal masses. 3. Small left pleural  effusion, likely malignant effusion, new since the prior CT. 4. Aortic Atherosclerosis (ICD10-I70.0). Electronically Signed   By: Anner Crete M.D.   On: 06/14/2021 22:26   DG Chest Portable 1 View  Result Date: 06/14/2021 CLINICAL DATA:  Chest pain and shortness of breath. EXAM:  PORTABLE CHEST 1 VIEW COMPARISON:  May 01, 2021 FINDINGS: The heart size and mediastinal contours are within normal limits. There are 2 masses in the left lung. The mass in the left mid to lower lung is significantly enlarged compared prior exam currently measuring 10 x 9.6 cm. The mass in the left apex is unchanged. There is no focal infiltrate, pulmonary edema, or pleural effusion. The visualized skeletal structures are stable. IMPRESSION: 2 masses in the left lung. The mass in the left mid to lower lung is significantly enlarged compared prior exam currently measuring 10 x 9.6 cm. Findings are suspicious for malignant neoplasm. No focal pneumonia or pulmonary edema. Electronically Signed   By: Abelardo Diesel M.D.   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  She initially presented with a left lower lobe lung mass as well as a left upper lobe lung mass and AP window, left infrahilar, and suspicious mild retrocrural adenopathy.  There is also a 5 x 7 mm right upper lobe nodule and multiple foci of soft solid nodules in the lungs suspicious for multifocal lung adenocarcinoma and or additional sites of malignancy.  She was diagnosed in September 2022.  There is not enough tissue for foundation 1/PD-L1 testing   PLAN: The patient was seen with Dr. Julien Nordmann today.  Dr. Julien Nordmann had lengthy discussion today with the patient about her current condition and recommended further work-up.  Dr. Julien Nordmann believes that this will likely be stage IV lung cancer given the small amount of pleural effusion and  abdominal adenopathy and other sites of disease in the lung.  We will help facilitate scheduling the patient's PET scan to complete the staging work-up.  This has been scheduled for 07/07/2021.  The brain MRI scheduled for 07/02/2021.  I reviewed these appointments with the patient and encouraged her to ensure that she is on time for her scheduled appointments.  We will refer the patient to radiation oncology for palliative radiation to the large and painful left lower lobe dominant lung mass.  It is likely this is her source of pain.  We will also send her prescription for fentanyl 25 mcg patches every 3 days for pain control.  She will continue taking her Norco 1 tablet every 6 hours for breakthrough pain.  Discussed with the patient that the goal would be to ultimately not require pain medication in the future.  I also discussed with the patient and her husband not to share medication and not to take any medications that are not prescribed to them.  We will see the patient back for a follow-up visit in 2 weeks ~07/08/21 for a more detailed discussion about the patient's current condition and recommended treatment options pending the staging workup.  We will arrange for the patient to have [facility] [employer] molecular testing performed today to see if she is a candidate for targeted treatment.   I gave the patient coupons for Ensure.  She was encouraged to increase her calorie intake due to her weight loss.   The patient's calcium is a little high today.  She takes a multivitamin.  I advised her to hold her multivitamin for now. Encouraged to stay hydrated.   The patient voices understanding of current disease status and treatment options and is in agreement with the current care plan.  All questions were answered. The patient knows to call the clinic with any problems, questions  or concerns. We can certainly see the patient much sooner if necessary.  Thank you so much for allowing me to participate in the  care of Kim Ross. I will continue to follow up the patient with you and assist in her care.   Disclaimer: This note was dictated with voice recognition software. Similar sounding words can inadvertently be transcribed and may not be corrected upon review.   Calvin Chura L Julie-Ann Vanmaanen June 24, 2021, 3:04 PM  ADDENDUM: Hematology/Oncology Attending: I had a face-to-face encounter with the patient today.  I reviewed her records, lab, scan and recommended her care plan.  This is a very pleasant 65 years old African-American female with past medical history significant for diabetes mellitus, dyslipidemia, anxiety, history of cocaine abuse as well as recurrent UTI and aspiration pneumonia.  The patient has been complaining of left-sided chest pain as well as shortness of breath for several weeks.  She had CT scan of the chest with contrast on 05/13/2021 that showed dominant 6.8 cm left lower lobe mass with suspected metastatic 3.0 cm left upper lobe mass and substantial AP window and left infrahilar adenopathy as well as mild retrocrural adenopathy in the upper abdomen highly suspicious for lung cancer.  There was also 0.5 x 0.7 cm right upper lobe nodule and multiple foci of soft solid nodules in the lungs suspicious for multifocal lung adenocarcinoma and/or sites of malignancy.  There was also 2 mildly enhancing lesion in the dome of the liver that need further evaluation.  The patient was supposed to see Dr. Lamonte Sakai for consideration of bronchoscopy and biopsy but she was diagnosed with COVID-19 and this delayed her evaluation.  On 06/14/2021 the patient presented again to the emergency department with worsening dyspnea and chest pain.  She had CT angiogram of the chest that showed no evidence of pulmonary embolism but there was significant interval progression of the malignancy with enlargement of the left lung and mediastinal masses as well as small left pleural effusion likely malignant in nature.   On 06/17/2021 she underwent diagnostic bronchoscopy and the final pathology ([id number]) of station 7 lymph nodes showed malignant cells consistent with non-small cell carcinoma. The malignant cells are positive for TTF-1 (focal), NapsinA (focal), MOC31, and cytokeratin 5/6. P40 is negative. The immunoprofile is  suggestive of an adenosquamous carcinoma.  The patient was referred to [place] today for evaluation and recommendation regarding treatment of her condition. When seen today she continues to have pain on the left side of the chest and radiation to the back.  She is currently on hydrocodone for pain management.  She also has shortness of breath at baseline increased with exertion. I had a lengthy discussion with the patient and her husband today about her current condition and treatment options.  I personally and independently reviewed the scan images and discussed the results with the patient and her husband. This patient is likely to have a stage IV (T3, N2, M1b) non-small cell lung cancer, adenosquamous carcinoma presented with large left lower lobe lung mass in addition to metastatic lesion in the left upper lobe as well as mediastinal lymphadenopathy, bilateral small pulmonary nodules as well as upper abdominal lymphadenopathy diagnosed in September 2022. I recommended for the patient to complete the staging work-up by ordering a PET scan as well as MRI of the brain to rule out any other metastatic disease. We checked with pathology and there is insufficient material for molecular studies.  We will send a blood test to [facility]  for molecular studies. I will refer the patient to radiation oncology for consideration of palliative radiotherapy to the painful lesion in the left lung. For pain management, will start the patient on fentanyl patch 25 mcg/hours every 3 days in addition to hydrocodone for the breakthrough pain. The patient will come back for follow-up visit in around 2 weeks for  evaluation and more detailed discussion of her treatment options based on the molecular studies.  If she has an actionable mutation, she will be treated with targeted therapy but if the patient has no actionable mutation, she would be considered for palliative care on hospice versus palliative systemic chemotherapy in combination with immunotherapy.  The patient and her husband are interested in treatment and not considering hospice at this point. She was advised to call immediately if she has any other concerning symptoms in the interval. The total time spent in the appointment was 90 minutes.  Disclaimer: This note was dictated with voice recognition software. Similar sounding words can inadvertently be transcribed and may be missed upon review. Eilleen Kempf, MD 06/24/21

## 2021-06-23 NOTE — Telephone Encounter (Signed)
Called and spoke with Patient.  Dr. Agustina Caroli recommendations given.  Understanding stated.  Nothing further at this time.

## 2021-06-23 NOTE — Telephone Encounter (Signed)
Noted in triage.

## 2021-06-24 ENCOUNTER — Inpatient Hospital Stay: Payer: 59

## 2021-06-24 ENCOUNTER — Telehealth: Payer: Self-pay

## 2021-06-24 ENCOUNTER — Inpatient Hospital Stay: Payer: 59 | Attending: Physician Assistant | Admitting: Physician Assistant

## 2021-06-24 ENCOUNTER — Other Ambulatory Visit: Payer: Self-pay

## 2021-06-24 ENCOUNTER — Ambulatory Visit: Payer: 59

## 2021-06-24 ENCOUNTER — Encounter: Payer: Self-pay | Admitting: Physician Assistant

## 2021-06-24 VITALS — BP 120/74 | HR 104 | Temp 97.6°F | Resp 16 | Ht 65.0 in | Wt 131.9 lb

## 2021-06-24 DIAGNOSIS — Z8616 Personal history of COVID-19: Secondary | ICD-10-CM | POA: Insufficient documentation

## 2021-06-24 DIAGNOSIS — Z87891 Personal history of nicotine dependence: Secondary | ICD-10-CM | POA: Diagnosis not present

## 2021-06-24 DIAGNOSIS — Z87442 Personal history of urinary calculi: Secondary | ICD-10-CM | POA: Insufficient documentation

## 2021-06-24 DIAGNOSIS — R918 Other nonspecific abnormal finding of lung field: Secondary | ICD-10-CM

## 2021-06-24 DIAGNOSIS — Z800 Family history of malignant neoplasm of digestive organs: Secondary | ICD-10-CM | POA: Diagnosis not present

## 2021-06-24 DIAGNOSIS — Z86711 Personal history of pulmonary embolism: Secondary | ICD-10-CM | POA: Insufficient documentation

## 2021-06-24 DIAGNOSIS — I700 Atherosclerosis of aorta: Secondary | ICD-10-CM | POA: Insufficient documentation

## 2021-06-24 DIAGNOSIS — C3490 Malignant neoplasm of unspecified part of unspecified bronchus or lung: Secondary | ICD-10-CM | POA: Insufficient documentation

## 2021-06-24 DIAGNOSIS — G893 Neoplasm related pain (acute) (chronic): Secondary | ICD-10-CM | POA: Insufficient documentation

## 2021-06-24 DIAGNOSIS — C3432 Malignant neoplasm of lower lobe, left bronchus or lung: Secondary | ICD-10-CM

## 2021-06-24 DIAGNOSIS — C3412 Malignant neoplasm of upper lobe, left bronchus or lung: Secondary | ICD-10-CM | POA: Insufficient documentation

## 2021-06-24 DIAGNOSIS — J90 Pleural effusion, not elsewhere classified: Secondary | ICD-10-CM | POA: Insufficient documentation

## 2021-06-24 LAB — CMP (CANCER CENTER ONLY)
ALT: 12 U/L (ref 0–44)
AST: 14 U/L — ABNORMAL LOW (ref 15–41)
Albumin: 2.4 g/dL — ABNORMAL LOW (ref 3.5–5.0)
Alkaline Phosphatase: 176 U/L — ABNORMAL HIGH (ref 38–126)
Anion gap: 11 (ref 5–15)
BUN: 11 mg/dL (ref 8–23)
CO2: 23 mmol/L (ref 22–32)
Calcium: 11 mg/dL — ABNORMAL HIGH (ref 8.9–10.3)
Chloride: 100 mmol/L (ref 98–111)
Creatinine: 0.72 mg/dL (ref 0.44–1.00)
GFR, Estimated: >60 mL/min (ref >60)
Glucose, Bld: 115 mg/dL — ABNORMAL HIGH (ref 70–99)
Potassium: 4.3 mmol/L (ref 3.5–5.1)
Sodium: 134 mmol/L — ABNORMAL LOW (ref 135–145)
Total Bilirubin: 0.5 mg/dL (ref 0.3–1.2)
Total Protein: 7.4 g/dL (ref 6.5–8.1)

## 2021-06-24 LAB — CBC WITH DIFFERENTIAL (CANCER CENTER ONLY)
Abs Immature Granulocytes: 0.16 10*3/uL — ABNORMAL HIGH (ref 0.00–0.07)
Basophils Absolute: 0.1 10*3/uL (ref 0.0–0.1)
Basophils Relative: 0 %
Eosinophils Absolute: 0.1 10*3/uL (ref 0.0–0.5)
Eosinophils Relative: 0 %
HCT: 36.1 % (ref 36.0–46.0)
Hemoglobin: 11.5 g/dL — ABNORMAL LOW (ref 12.0–15.0)
Immature Granulocytes: 1 %
Lymphocytes Relative: 8 %
Lymphs Abs: 1.9 10*3/uL (ref 0.7–4.0)
MCH: 27.5 pg (ref 26.0–34.0)
MCHC: 31.9 g/dL (ref 30.0–36.0)
MCV: 86.4 fL (ref 80.0–100.0)
Monocytes Absolute: 1 10*3/uL (ref 0.1–1.0)
Monocytes Relative: 4 %
Neutro Abs: 20.4 10*3/uL — ABNORMAL HIGH (ref 1.7–7.7)
Neutrophils Relative %: 87 %
Platelet Count: 466 10*3/uL — ABNORMAL HIGH (ref 150–400)
RBC: 4.18 MIL/uL (ref 3.87–5.11)
RDW: 14.2 % (ref 11.5–15.5)
WBC Count: 23.6 10*3/uL — ABNORMAL HIGH (ref 4.0–10.5)
nRBC: 0 % (ref 0.0–0.2)

## 2021-06-24 MED ORDER — FENTANYL 25 MCG/HR TD PT72: 1.0000 | MEDICATED_PATCH | TRANSDERMAL | 0 refills | Status: DC

## 2021-06-24 NOTE — Patient Instructions (Addendum)
Summary:  -There are two main categories of lung cancer, they are named based on the size of the cancer cell. One is called Non-Small cell lung cancer. 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 This is a combination of the two main types.  -We covered a lot of important information at your appointment today regarding what the treatment plan is moving forward. Here are the the main points that were discussed at your office visit with [place] today:  -It is concerning for stage IV lung cancer given the multiple spots in the lung, lymph nodes in the abdomen, and the small amount of fluid in the lung.  -Treatment is usually chemotherapy/immunotherapy, but we are going to test you to see if you are a candidate for a pill. We will know more information at your next appointment and will go over the final treatment plan at that time.   Medications:  -I will send in a fentanyl patch to your pharmacy. The patches are good for 72 hours. You can continue to use your norco every 6 hours as needed for break through pain. Please stop taking the tramadol for now.   Referrals or Imaging: -Please keep the appointments for the brain MRI and PET scan as scheduled (see below). This helps [place] stage the cancer so we know how to best treat you.  -We will also get you with the radiation doctor to help shrink the tumor and help with pain. Hopefully, we can get the pain under control in hopes that you no longer will require any pain medication.    Follow up:  -We will see you back for a follow up visit 2 weeks for evaluation and a more detailed discussion about treatment.   -If you need to reach [place] at any time, the main office number to the cancer center is [phone], when you call, ask to speak to either Cassie's or Dr. Worthy Flank nurse.

## 2021-06-24 NOTE — Telephone Encounter (Signed)
Fax received advising PA is needed for rx Fentanyl.  PA completed via Cover My Meds and approved.   Auth: [id number]

## 2021-06-25 ENCOUNTER — Telehealth: Payer: Self-pay

## 2021-06-25 NOTE — Telephone Encounter (Signed)
Pt called stating Fentanyl and Norco don't seem to be helping. Pts husband then stated he wanted her dosage increased because "I know she's on the lowest dose".   The pt shares she just placed the patch on but her husband states she put it on last night and took Norco two hours later.  I have advised pt if her pain is not been relieved at all with the addition of Fentanyl, then she may need to go to the ER. Pt was reluctant to go to the ER and ask if she can take Tylenol to see if that helps. She was advised she can take Tylenol but not to exceed 2,500mg  in a 24hr time period considering her Norco also has Tylenol in it. Pts expressed understanding of this information and to call us back if her sx worsen or the Tylenol doesn't help.
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
## 2021-07-01 NOTE — Progress Notes (Deleted)
Location of tumor and Histology per Pathology Report: LLL lung  Biopsy:  06/17/2021 A. LYMPH NODE, 11R, FINE NEEDLE ASPIRATION:  - No malignant cells identified   B. 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 LYMPH NODE, 4R, FINE NEEDLE ASPIRATION:  - Rare malignant cells present   Past/Anticipated interventions by surgeon, if any:   Provider Performing:Nathaniel Merlene Pulling   Procedure: Flexible bronchoscopy and EBUS Bronchoscopy  Past/Anticipated interventions by medical oncology, if any: Dr Julien Nordmann will refer the patient to radiation oncology for consideration of palliative radiotherapy to the painful lesion in the left lung If she has an actionable mutation, she will be treated with targeted therapy but if the patient has no actionable mutation, she would be considered for palliative care on hospice versus palliative systemic chemotherapy in combination with immunotherapy   Pain issues, if any:  yes, 10/10 back pain constant, sharp, and stabbing   SAFETY ISSUES: Prior radiation? no Pacemaker/ICD? no Possible current pregnancy? No, hysterectomy Is the patient on methotrexate? no   Current Complaints / other details:  none         Vitals:    07/08/21 1109  BP: 115/80  Pulse: (!) 118  Resp: 19  Temp: (!) 96.8 F (36 C)  SpO2: 97%  Weight: 123 lb 4.8 oz (55.9 kg)  Height: _0  (1.651 m)

## 2021-07-02 ENCOUNTER — Telehealth: Payer: Self-pay | Admitting: Emergency Medicine

## 2021-07-02 ENCOUNTER — Ambulatory Visit (HOSPITAL_COMMUNITY): Admission: RE | Admit: 2021-07-02 | Payer: 59 | Source: Ambulatory Visit

## 2021-07-02 DIAGNOSIS — C3490 Malignant neoplasm of unspecified part of unspecified bronchus or lung: Secondary | ICD-10-CM

## 2021-07-02 NOTE — Telephone Encounter (Signed)
MRI has been canceled and CT head has been ordered. ATC call Horris Latino to update her line just rang and rang. ATC patient to give her an update, LMTCB
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
## 2021-07-02 NOTE — Telephone Encounter (Signed)
OK would cancel the MRI.  Change to a CT head with contrast please

## 2021-07-07 ENCOUNTER — Encounter (HOSPITAL_COMMUNITY)
Admission: RE | Admit: 2021-07-07 | Discharge: 2021-07-07 | Disposition: A | Discharge status: Home / Self Care | Payer: 59 | Source: Ambulatory Visit | Attending: Emergency Medicine | Admitting: Emergency Medicine

## 2021-07-07 ENCOUNTER — Other Ambulatory Visit: Payer: Self-pay

## 2021-07-07 DIAGNOSIS — R918 Other nonspecific abnormal finding of lung field: Secondary | ICD-10-CM | POA: Insufficient documentation

## 2021-07-07 LAB — GLUCOSE, CAPILLARY: Glucose-Capillary: 135 mg/dL — ABNORMAL HIGH (ref 70–99)

## 2021-07-07 MED ORDER — FLUDEOXYGLUCOSE F - 18 (FDG) INJECTION
6.5000 | Freq: Once | INTRAVENOUS | Status: AC | PRN
  Administered 2021-07-07: 6.5 via INTRAVENOUS

## 2021-07-08 ENCOUNTER — Inpatient Hospital Stay (HOSPITAL_BASED_OUTPATIENT_CLINIC_OR_DEPARTMENT_OTHER): Payer: 59 | Admitting: Physician Assistant

## 2021-07-08 ENCOUNTER — Encounter: Payer: Self-pay | Admitting: Radiation Oncology

## 2021-07-08 ENCOUNTER — Ambulatory Visit
Admission: RE | Admit: 2021-07-08 | Discharge: 2021-07-08 | Disposition: A | Discharge status: Home / Self Care | Payer: 59 | Source: Ambulatory Visit | Attending: Radiation Oncology | Admitting: Radiation Oncology

## 2021-07-08 ENCOUNTER — Ambulatory Visit: Payer: 59

## 2021-07-08 ENCOUNTER — Other Ambulatory Visit: Payer: Self-pay | Admitting: Internal Medicine

## 2021-07-08 ENCOUNTER — Telehealth: Payer: Self-pay

## 2021-07-08 ENCOUNTER — Inpatient Hospital Stay: Payer: 59

## 2021-07-08 VITALS — BP 115/80 | HR 118 | Temp 96.8°F | Resp 19 | Ht 65.0 in | Wt 123.3 lb

## 2021-07-08 DIAGNOSIS — C778 Secondary and unspecified malignant neoplasm of lymph nodes of multiple regions: Secondary | ICD-10-CM | POA: Insufficient documentation

## 2021-07-08 DIAGNOSIS — C3432 Malignant neoplasm of lower lobe, left bronchus or lung: Secondary | ICD-10-CM | POA: Diagnosis present

## 2021-07-08 DIAGNOSIS — C3492 Malignant neoplasm of unspecified part of left bronchus or lung: Secondary | ICD-10-CM | POA: Diagnosis not present

## 2021-07-08 DIAGNOSIS — Z5111 Encounter for antineoplastic chemotherapy: Secondary | ICD-10-CM | POA: Insufficient documentation

## 2021-07-08 DIAGNOSIS — G893 Neoplasm related pain (acute) (chronic): Secondary | ICD-10-CM | POA: Diagnosis not present

## 2021-07-08 DIAGNOSIS — Z5112 Encounter for antineoplastic immunotherapy: Secondary | ICD-10-CM | POA: Insufficient documentation

## 2021-07-08 DIAGNOSIS — Z79899 Other long term (current) drug therapy: Secondary | ICD-10-CM | POA: Insufficient documentation

## 2021-07-08 DIAGNOSIS — J90 Pleural effusion, not elsewhere classified: Secondary | ICD-10-CM | POA: Insufficient documentation

## 2021-07-08 DIAGNOSIS — C3412 Malignant neoplasm of upper lobe, left bronchus or lung: Secondary | ICD-10-CM | POA: Insufficient documentation

## 2021-07-08 DIAGNOSIS — C7951 Secondary malignant neoplasm of bone: Secondary | ICD-10-CM | POA: Diagnosis not present

## 2021-07-08 DIAGNOSIS — R634 Abnormal weight loss: Secondary | ICD-10-CM

## 2021-07-08 DIAGNOSIS — I700 Atherosclerosis of aorta: Secondary | ICD-10-CM | POA: Insufficient documentation

## 2021-07-08 MED ORDER — HYDROCODONE-ACETAMINOPHEN 5-325 MG PO TABS: 1.0000 | ORAL_TABLET | Freq: Four times a day (QID) | ORAL | 0 refills | Status: DC | PRN

## 2021-07-08 MED ORDER — CYANOCOBALAMIN 1000 MCG/ML IJ SOLN
1000.0000 ug | Freq: Once | INTRAMUSCULAR | Status: AC
  Administered 2021-07-08: 1000 ug via INTRAMUSCULAR
  Filled 2021-07-08: qty 1

## 2021-07-08 MED ORDER — FENTANYL 50 MCG/HR TD PT72: 1.0000 | MEDICATED_PATCH | TRANSDERMAL | 0 refills | Status: DC

## 2021-07-08 MED ORDER — FOLIC ACID 1 MG PO TABS: 1.0000 mg | ORAL_TABLET | Freq: Every day | ORAL | 2 refills | Status: DC

## 2021-07-08 MED ORDER — PROCHLORPERAZINE MALEATE 10 MG PO TABS: 10.0000 mg | ORAL_TABLET | Freq: Four times a day (QID) | ORAL | 2 refills | Status: DC | PRN

## 2021-07-08 NOTE — Patient Instructions (Signed)
Summary:  -There are two main categories of lung cancer, they are named based on the size of the cancer cell. One is called Non-Small cell lung cancer. The other type is Small Cell Lung Cancer -The sample (biopsy) that they took of your tumor was consistent with a subtype of Non-small cell lung cancer called Adenocarcinoma. This is the most common type of lung cancer.  -We covered a lot of important information at your appointment today regarding what the treatment plan is moving forward. Here are the the main points that were discussed at your office visit with [place] today:  -The treatment that you will receive consists of two chemotherapy drugs, called Carboplatin and Alimta (also called Pemetrexed) and one immunotherapy drug called Keytruda (pembrolizumab).  -We are planning on starting your treatment next week on 07/15/21 but before your start your treatment, I would like you to attend a Chemotherapy Education Class. This involves having you sit down with one of our nurse educators. She will discuss with your one-on-one more details about your treatment as well as general information about resources here at the cancer center.  -Your treatment will be given once every 3 weeks. We will check your labs once a week for the first ~5 treatments just to make sure that important components of your blood are in an acceptable range -We will get a CT scan after 3 treatments to check on the progress of treatment  Medications:  -I have sent a few important medication prescriptions to your pharmacy.  -Compazine was sent to your pharmacy. This medication is for nausea. You may take this every 6 hours as needed if you feel nauseous.  -I have also sent a prescription for 1 mg of folic acid to your pharmacy. We need you to take 1 tablet every day.  -We will administer vitamin B12 every 9 weeks while you are here in the clinic. You have received your first dose today.  -I will send a refill of fentanyl patches to the  pharmacy. I will also refill the norco to take every 6 hours for break through pain.   Referrals or Imaging: -Please keep the appointment with radiation oncology today  -Please ensure to go to the head CT scan tomorrow so we can ensure there is no spread to the brain  -I will refer her to a member of the nutritionist team. Please try to drink boost/ensure 2-4x per day.  -We will submit a request to get a bedside commode and walker   Follow up:  -We will see you back for a follow up visit 1 week after your first treatment to see how it went and help manage any side effects of treatment that you may have   -If you need to reach [place] at any time, the main office number to the cancer center is [phone], when you call, ask to speak to either Cassie's or Dr. Worthy Flank nurse.

## 2021-07-08 NOTE — Progress Notes (Signed)
See MD note for nursing evaluation. °

## 2021-07-08 NOTE — Progress Notes (Signed)
Location of tumor and Histology per Pathology Report: LLL lung  Biopsy:  06/17/2021 A. LYMPH NODE, 11R, FINE NEEDLE ASPIRATION:  - No malignant cells identified   B. 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 LYMPH NODE, 4R, FINE NEEDLE ASPIRATION:  - Rare malignant cells present   Past/Anticipated interventions by surgeon, if any:   Provider Performing:Nathaniel Merlene Pulling   Procedure: Flexible bronchoscopy and EBUS Bronchoscopy  Past/Anticipated interventions by medical oncology, if any: Dr Julien Nordmann will refer the patient to radiation oncology for consideration of palliative radiotherapy to the painful lesion in the left lung If she has an actionable mutation, she will be treated with targeted therapy but if the patient has no actionable mutation, she would be considered for palliative care on hospice versus palliative systemic chemotherapy in combination with immunotherapy    Pain issues, if any:  yes, 10/10 back pain constant, sharp, and stabbing  SAFETY ISSUES: Prior radiation? no Pacemaker/ICD? no Possible current pregnancy? No, hysterectomy Is the patient on methotrexate? no  Current Complaints / other details:  none     Vitals:   07/08/21 1109  BP: 115/80  Pulse: (!) 118  Resp: 19  Temp: (!) 96.8 F (36 C)  SpO2: 97%  Weight: 123 lb 4.8 oz (55.9 kg)  Height: _0  (1.651 m)

## 2021-07-08 NOTE — Telephone Encounter (Signed)
Oerders for walker and bedside commode submitted to [facility] who advised they are not covered but pt can ask her healthcare provider, [facility], where they want her to receive her DME from.  I have called the pts cell and her spouse answered. I advised him of this information and he expressed understanding of the information and advised they will check with her insurance. I advised him we will send the order wherever she would like.

## 2021-07-08 NOTE — Progress Notes (Signed)
START ON PATHWAY REGIMEN - Non-Small Cell Lung     A cycle is every 21 days:     Pembrolizumab      Pemetrexed      Carboplatin   **Always confirm dose/schedule in your pharmacy ordering system**  Patient Characteristics: Stage IV Metastatic, Nonsquamous, Molecular Analysis Completed, Molecular Alteration Present and Targeted Therapy Exhausted OR EGFR Exon 20+ or KRAS G12C+ or HER2+ Present and No Prior Chemo/Immunotherapy OR No Alteration Present, Initial  Chemotherapy/Immunotherapy, PS = 0, 1, No Alteration Present, No Alteration Present, Candidate for Immunotherapy, PD-L1 Expression Positive 1-49% (TPS) / Negative / Not Tested / Awaiting Test Results and Immunotherapy Candidate Therapeutic Status: Stage IV Metastatic Histology: Nonsquamous Cell Broad Molecular Profiling Status: Molecular Analysis Completed Molecular Analysis Results: No Alteration Present ECOG Performance Status: 1 Chemotherapy/Immunotherapy Line of Therapy: Initial Chemotherapy/Immunotherapy EGFR Exons 18-21 Mutation Testing Status: Completed and Negative ALK Fusion/Rearrangement Testing Status: Completed and Negative BRAF V600 Mutation Testing Status: Completed and Negative KRAS G12C Mutation Testing Status: Completed and Negative MET Exon 14 Mutation Testing Status: Completed and Negative RET Fusion/Rearrangement Testing Status: Completed and Negative HER2 Mutation Testing Status: Completed and Negative NTRK Fusion/Rearrangement Testing Status: Completed and Negative ROS1 Fusion/Rearrangement Testing Status: Completed and Negative Immunotherapy Candidate Status: Candidate for Immunotherapy PD-L1 Expression Status: Awaiting Test Results Intent of Therapy: Non-Curative / Palliative Intent, Discussed with Patient

## 2021-07-09 ENCOUNTER — Other Ambulatory Visit: Payer: Self-pay

## 2021-07-09 ENCOUNTER — Telehealth: Payer: Self-pay | Admitting: Physician Assistant

## 2021-07-09 ENCOUNTER — Ambulatory Visit (INDEPENDENT_AMBULATORY_CARE_PROVIDER_SITE_OTHER)
Admission: RE | Admit: 2021-07-09 | Discharge: 2021-07-09 | Disposition: A | Discharge status: Home / Self Care | Payer: 59 | Source: Ambulatory Visit | Attending: Emergency Medicine | Admitting: Emergency Medicine

## 2021-07-09 ENCOUNTER — Ambulatory Visit
Admission: RE | Admit: 2021-07-09 | Discharge: 2021-07-09 | Disposition: A | Discharge status: Home / Self Care | Payer: 59 | Source: Ambulatory Visit | Attending: Radiation Oncology | Admitting: Radiation Oncology

## 2021-07-09 ENCOUNTER — Encounter: Payer: Self-pay | Admitting: General Practice

## 2021-07-09 DIAGNOSIS — C3432 Malignant neoplasm of lower lobe, left bronchus or lung: Secondary | ICD-10-CM | POA: Diagnosis not present

## 2021-07-09 DIAGNOSIS — Z5111 Encounter for antineoplastic chemotherapy: Secondary | ICD-10-CM | POA: Insufficient documentation

## 2021-07-09 DIAGNOSIS — C3490 Malignant neoplasm of unspecified part of unspecified bronchus or lung: Secondary | ICD-10-CM | POA: Diagnosis not present

## 2021-07-09 DIAGNOSIS — C3412 Malignant neoplasm of upper lobe, left bronchus or lung: Secondary | ICD-10-CM | POA: Insufficient documentation

## 2021-07-09 DIAGNOSIS — C7931 Secondary malignant neoplasm of brain: Secondary | ICD-10-CM | POA: Diagnosis not present

## 2021-07-09 DIAGNOSIS — J90 Pleural effusion, not elsewhere classified: Secondary | ICD-10-CM | POA: Diagnosis not present

## 2021-07-09 DIAGNOSIS — C3492 Malignant neoplasm of unspecified part of left bronchus or lung: Secondary | ICD-10-CM

## 2021-07-09 DIAGNOSIS — Z79899 Other long term (current) drug therapy: Secondary | ICD-10-CM | POA: Insufficient documentation

## 2021-07-09 DIAGNOSIS — I700 Atherosclerosis of aorta: Secondary | ICD-10-CM | POA: Insufficient documentation

## 2021-07-09 DIAGNOSIS — Z5112 Encounter for antineoplastic immunotherapy: Secondary | ICD-10-CM | POA: Insufficient documentation

## 2021-07-09 DIAGNOSIS — Z510 Encounter for antineoplastic radiation therapy: Secondary | ICD-10-CM | POA: Diagnosis present

## 2021-07-09 MED ORDER — IOHEXOL 350 MG/ML SOLN
75.0000 mL | Freq: Once | INTRAVENOUS | Status: AC | PRN
  Administered 2021-07-09: 75 mL via INTRAVENOUS

## 2021-07-09 NOTE — Telephone Encounter (Signed)
Per 10/5 sch msg, appt was schedule and pt is aware
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
## 2021-07-10 ENCOUNTER — Telehealth: Payer: Self-pay | Admitting: Internal Medicine

## 2021-07-10 ENCOUNTER — Telehealth: Payer: Self-pay | Admitting: Physician Assistant

## 2021-07-10 ENCOUNTER — Telehealth: Payer: Self-pay | Admitting: General Practice

## 2021-07-10 NOTE — Telephone Encounter (Signed)
[place] CSW Progress Notes  Provided contact information to pastor [name] to give to patient - [employer] - 1 [phone] - part of Legal Aid.  Patient can get help w her limited Medicare coverage.

## 2021-07-10 NOTE — Telephone Encounter (Signed)
Sch per 10/5 los, left msg

## 2021-07-10 NOTE — Telephone Encounter (Signed)
Sch per  10/5 los, pt aware

## 2021-07-12 DIAGNOSIS — Z5111 Encounter for antineoplastic chemotherapy: Secondary | ICD-10-CM | POA: Diagnosis not present

## 2021-07-13 ENCOUNTER — Other Ambulatory Visit: Payer: Self-pay

## 2021-07-13 ENCOUNTER — Ambulatory Visit
Admission: RE | Admit: 2021-07-13 | Discharge: 2021-07-13 | Disposition: A | Discharge status: Home / Self Care | Payer: 59 | Source: Ambulatory Visit | Attending: Radiation Oncology | Admitting: Radiation Oncology

## 2021-07-13 ENCOUNTER — Other Ambulatory Visit: Payer: Self-pay | Admitting: Physician Assistant

## 2021-07-13 ENCOUNTER — Inpatient Hospital Stay: Payer: 59

## 2021-07-13 ENCOUNTER — Encounter: Payer: Self-pay | Admitting: *Deleted

## 2021-07-13 DIAGNOSIS — Z5111 Encounter for antineoplastic chemotherapy: Secondary | ICD-10-CM | POA: Diagnosis not present

## 2021-07-13 DIAGNOSIS — C3492 Malignant neoplasm of unspecified part of left bronchus or lung: Secondary | ICD-10-CM
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
## 2021-07-13 NOTE — Progress Notes (Signed)
Per [employer] [employer] portal, molecular test results are pending at this time.

## 2021-07-14 ENCOUNTER — Telehealth: Payer: Self-pay | Admitting: *Deleted

## 2021-07-14 ENCOUNTER — Telehealth: Payer: Self-pay | Admitting: General Practice

## 2021-07-14 ENCOUNTER — Ambulatory Visit
Admission: RE | Admit: 2021-07-14 | Discharge: 2021-07-14 | Disposition: A | Discharge status: Home / Self Care | Payer: 59 | Source: Ambulatory Visit | Attending: Radiation Oncology | Admitting: Radiation Oncology

## 2021-07-14 ENCOUNTER — Inpatient Hospital Stay: Payer: 59

## 2021-07-14 ENCOUNTER — Encounter: Payer: Self-pay | Admitting: Radiation Oncology

## 2021-07-14 ENCOUNTER — Other Ambulatory Visit: Payer: Self-pay

## 2021-07-14 ENCOUNTER — Encounter: Payer: Self-pay | Admitting: *Deleted

## 2021-07-14 ENCOUNTER — Ambulatory Visit: Payer: 59 | Admitting: Internal Medicine

## 2021-07-14 VITALS — BP 116/81 | HR 106 | Temp 97.3°F | Resp 16

## 2021-07-14 DIAGNOSIS — N200 Calculus of kidney: Secondary | ICD-10-CM | POA: Insufficient documentation

## 2021-07-14 DIAGNOSIS — Z79899 Other long term (current) drug therapy: Secondary | ICD-10-CM | POA: Insufficient documentation

## 2021-07-14 DIAGNOSIS — Z5111 Encounter for antineoplastic chemotherapy: Secondary | ICD-10-CM | POA: Diagnosis not present

## 2021-07-14 DIAGNOSIS — Z809 Family history of malignant neoplasm, unspecified: Secondary | ICD-10-CM | POA: Insufficient documentation

## 2021-07-14 DIAGNOSIS — K769 Liver disease, unspecified: Secondary | ICD-10-CM | POA: Diagnosis not present

## 2021-07-14 DIAGNOSIS — C3432 Malignant neoplasm of lower lobe, left bronchus or lung: Secondary | ICD-10-CM | POA: Insufficient documentation

## 2021-07-14 DIAGNOSIS — C7931 Secondary malignant neoplasm of brain: Secondary | ICD-10-CM | POA: Insufficient documentation

## 2021-07-14 DIAGNOSIS — J90 Pleural effusion, not elsewhere classified: Secondary | ICD-10-CM | POA: Diagnosis not present

## 2021-07-14 DIAGNOSIS — C719 Malignant neoplasm of brain, unspecified: Secondary | ICD-10-CM

## 2021-07-14 DIAGNOSIS — Z8616 Personal history of COVID-19: Secondary | ICD-10-CM | POA: Diagnosis not present

## 2021-07-14 DIAGNOSIS — F17210 Nicotine dependence, cigarettes, uncomplicated: Secondary | ICD-10-CM | POA: Insufficient documentation

## 2021-07-14 DIAGNOSIS — R200 Anesthesia of skin: Secondary | ICD-10-CM | POA: Insufficient documentation

## 2021-07-14 DIAGNOSIS — R42 Dizziness and giddiness: Secondary | ICD-10-CM | POA: Insufficient documentation

## 2021-07-14 DIAGNOSIS — R270 Ataxia, unspecified: Secondary | ICD-10-CM | POA: Diagnosis not present

## 2021-07-14 DIAGNOSIS — C3492 Malignant neoplasm of unspecified part of left bronchus or lung: Secondary | ICD-10-CM

## 2021-07-14 DIAGNOSIS — Z87440 Personal history of urinary (tract) infections: Secondary | ICD-10-CM | POA: Insufficient documentation

## 2021-07-14 DIAGNOSIS — R410 Disorientation, unspecified: Secondary | ICD-10-CM | POA: Diagnosis not present

## 2021-07-14 MED ORDER — SONAFINE EX EMUL
1.0000 "application " | Freq: Once | CUTANEOUS | Status: AC
  Administered 2021-07-14: 1 via TOPICAL

## 2021-07-14 MED FILL — Dexamethasone Sodium Phosphate Inj 100 MG/10ML: INTRAMUSCULAR | Qty: 1 | Status: AC

## 2021-07-14 MED FILL — Fosaprepitant Dimeglumine For IV Infusion 150 MG (Base Eq): INTRAVENOUS | Qty: 5 | Status: AC

## 2021-07-14 NOTE — Progress Notes (Signed)
I updated Journalist, newspaper AD that patient did not show up for her chemo class today and has not returned my phone call.  She is to start chemo tomorrow and I wanted to let her know she missed her class today.

## 2021-07-14 NOTE — Progress Notes (Signed)
Location/Histology of Brain Tumor:   Patient presented with symptoms of:    Past or anticipated interventions, if any, per neurosurgery:   Past or anticipated interventions, if any, per medical oncology:   Dose of Decadron, if applicable:   Recent neurologic symptoms, if any:  Seizures: no Headaches: no Nausea: no Dizziness/ataxia: some Difficulty with hand coordination: yes  Focal numbness/weakness: sometime hand feet Visual deficits/changes: no Confusion/Memory deficits: some  Painful bone metastases at present, if any:   SAFETY ISSUES: Prior radiation? Yes currently for chest Pacemaker/ICD? no Possible current pregnancy? No Is the patient on methotrexate? no  Additional Complaints / other details:  Vitals:   07/14/21 1341  BP: 116/81  Pulse: (!) 106  Resp: 16  Temp: (!) 97.3 F (36.3 C)  TempSrc: Temporal  SpO2: 100%

## 2021-07-14 NOTE — Telephone Encounter (Signed)
Pt did not show for education appt today.  Message to MD/Mohamed/Pod/Dana.  Left message at pt's home # to call back.  Hinton Dyer also left message.

## 2021-07-14 NOTE — Telephone Encounter (Signed)
I received a message that patient did not make her chemo education appt.  I called to check on her but was unable to reach her. I did leave a vm message with my name and phone number to call.

## 2021-07-14 NOTE — Telephone Encounter (Signed)
Called to f/u with pt on missed Chemo Edu appt. Was unable to reach pt due to vm being full. Also, called numbers on snapshot and not answer and unable to leave vm on those as well.

## 2021-07-14 NOTE — Telephone Encounter (Signed)
[place] CSW Progress Notes  Patient contacted support center asking for help with turning in paperwork related to cancer insurance policy.  Unable to reach patient, VM full.  Asked treatment team seeing her today to discuss this request w patient and possibly get paperwork to [facility] personnel who respond to requests for insurance paperwork processing.  Edwyna Shell, LCSW Clinical Social Worker Phone:  [phone]

## 2021-07-14 NOTE — Progress Notes (Signed)
Pt here for patient teaching.    Pt given Radiation and You booklet, skin care instructions, and Sonafine.    Reviewed areas of pertinence such as fatigue, hair loss, nausea and vomiting, skin changes, throat changes, cough, and shortness of breath .   Pt able to give teach back of to pat skin, use unscented/gentle soap, and drink plenty of water,apply Sonafine bid and avoid applying anything to skin within 4 hours of treatment.   Pt verbalizes understanding of information given and will contact nursing with any questions or concerns.    [email or web address]

## 2021-07-14 NOTE — Progress Notes (Signed)
Pharmacist Chemotherapy Monitoring - Initial Assessment    Anticipated start date: 07/15/21   The following has been reviewed per standard work regarding the patient's treatment regimen: The patient's diagnosis, treatment plan and drug doses, and organ/hematologic function Lab orders and baseline tests specific to treatment regimen  The treatment plan start date, drug sequencing, and pre-medications Prior authorization status  Patient's documented medication list, including drug-drug interaction screen and prescriptions for anti-emetics and supportive care specific to the treatment regimen The drug concentrations, fluid compatibility, administration routes, and timing of the medications to be used The patient's access for treatment and lifetime cumulative dose history, if applicable  The patient's medication allergies and previous infusion related reactions, if applicable   Changes made to treatment plan:  N/A  Follow up needed:  N/A   Kim Ross, [place], 07/14/2021  12:47 PM

## 2021-07-15 ENCOUNTER — Ambulatory Visit: Payer: 59

## 2021-07-15 ENCOUNTER — Inpatient Hospital Stay: Payer: 59

## 2021-07-15 ENCOUNTER — Encounter: Payer: Self-pay | Admitting: Radiation Oncology

## 2021-07-15 ENCOUNTER — Encounter: Payer: Self-pay | Admitting: Internal Medicine

## 2021-07-15 ENCOUNTER — Inpatient Hospital Stay: Payer: 59 | Admitting: General Practice

## 2021-07-15 ENCOUNTER — Encounter: Payer: Self-pay | Admitting: General Practice

## 2021-07-15 ENCOUNTER — Other Ambulatory Visit: Payer: Self-pay | Admitting: Physician Assistant

## 2021-07-15 VITALS — BP 120/76 | HR 105 | Temp 97.8°F | Resp 20

## 2021-07-15 DIAGNOSIS — C3492 Malignant neoplasm of unspecified part of left bronchus or lung: Secondary | ICD-10-CM

## 2021-07-15 DIAGNOSIS — C7931 Secondary malignant neoplasm of brain: Secondary | ICD-10-CM | POA: Insufficient documentation

## 2021-07-15 DIAGNOSIS — D72829 Elevated white blood cell count, unspecified: Secondary | ICD-10-CM

## 2021-07-15 DIAGNOSIS — Z5111 Encounter for antineoplastic chemotherapy: Secondary | ICD-10-CM | POA: Diagnosis not present

## 2021-07-15 LAB — CMP (CANCER CENTER ONLY)
ALT: 16 U/L (ref 0–44)
AST: 20 U/L (ref 15–41)
Albumin: 2.4 g/dL — ABNORMAL LOW (ref 3.5–5.0)
Alkaline Phosphatase: 200 U/L — ABNORMAL HIGH (ref 38–126)
Anion gap: 12 (ref 5–15)
BUN: 13 mg/dL (ref 8–23)
CO2: 22 mmol/L (ref 22–32)
Calcium: 9.6 mg/dL (ref 8.9–10.3)
Chloride: 101 mmol/L (ref 98–111)
Creatinine: 0.51 mg/dL (ref 0.44–1.00)
GFR, Estimated: >60 mL/min (ref >60)
Glucose, Bld: 119 mg/dL — ABNORMAL HIGH (ref 70–99)
Potassium: 4.3 mmol/L (ref 3.5–5.1)
Sodium: 135 mmol/L (ref 135–145)
Total Bilirubin: 0.6 mg/dL (ref 0.3–1.2)
Total Protein: 7.2 g/dL (ref 6.5–8.1)

## 2021-07-15 LAB — CBC WITH DIFFERENTIAL (CANCER CENTER ONLY)
Abs Immature Granulocytes: 0.39 10*3/uL — ABNORMAL HIGH (ref 0.00–0.07)
Basophils Absolute: 0.1 10*3/uL (ref 0.0–0.1)
Basophils Relative: 0 %
Eosinophils Absolute: 0 10*3/uL (ref 0.0–0.5)
Eosinophils Relative: 0 %
HCT: 33.4 % — ABNORMAL LOW (ref 36.0–46.0)
Hemoglobin: 11 g/dL — ABNORMAL LOW (ref 12.0–15.0)
Immature Granulocytes: 1 %
Lymphocytes Relative: 3 %
Lymphs Abs: 1.3 10*3/uL (ref 0.7–4.0)
MCH: 27.6 pg (ref 26.0–34.0)
MCHC: 32.9 g/dL (ref 30.0–36.0)
MCV: 83.9 fL (ref 80.0–100.0)
Monocytes Absolute: 1.3 10*3/uL — ABNORMAL HIGH (ref 0.1–1.0)
Monocytes Relative: 3 %
Neutro Abs: 34.9 10*3/uL — ABNORMAL HIGH (ref 1.7–7.7)
Neutrophils Relative %: 93 %
Platelet Count: 599 10*3/uL — ABNORMAL HIGH (ref 150–400)
RBC: 3.98 MIL/uL (ref 3.87–5.11)
RDW: 15.9 % — ABNORMAL HIGH (ref 11.5–15.5)
WBC Count: 38 10*3/uL — ABNORMAL HIGH (ref 4.0–10.5)
nRBC: 0 % (ref 0.0–0.2)

## 2021-07-15 LAB — TSH: TSH: 3.272 u[IU]/mL (ref 0.308–3.960)

## 2021-07-15 MED ORDER — AMOXICILLIN-POT CLAVULANATE 875-125 MG PO TABS: 1.0000 | ORAL_TABLET | Freq: Two times a day (BID) | ORAL | 0 refills | Status: DC

## 2021-07-15 MED ORDER — SODIUM CHLORIDE 0.9 % IV SOLN
442.5000 mg | Freq: Once | INTRAVENOUS | Status: AC
  Administered 2021-07-15: 440 mg via INTRAVENOUS
  Filled 2021-07-15: qty 44

## 2021-07-15 MED ORDER — SODIUM CHLORIDE 0.9 % IV SOLN: Freq: Once | INTRAVENOUS | Status: AC

## 2021-07-15 MED ORDER — SODIUM CHLORIDE 0.9 % IV SOLN
500.0000 mg/m2 | Freq: Once | INTRAVENOUS | Status: AC
  Administered 2021-07-15: 800 mg via INTRAVENOUS
  Filled 2021-07-15: qty 20

## 2021-07-15 MED ORDER — SODIUM CHLORIDE 0.9 % IV SOLN
200.0000 mg | Freq: Once | INTRAVENOUS | Status: AC
  Administered 2021-07-15: 200 mg via INTRAVENOUS
  Filled 2021-07-15: qty 8

## 2021-07-15 MED ORDER — FOSAPREPITANT DIMEGLUMINE INJECTION 150 MG
150.0000 mg | Freq: Once | INTRAVENOUS | Status: AC
  Administered 2021-07-15: 150 mg via INTRAVENOUS
  Filled 2021-07-15: qty 150

## 2021-07-15 MED ORDER — SODIUM CHLORIDE 0.9 % IV SOLN
10.0000 mg | Freq: Once | INTRAVENOUS | Status: AC
  Administered 2021-07-15: 10 mg via INTRAVENOUS
  Filled 2021-07-15: qty 10

## 2021-07-15 MED ORDER — PALONOSETRON HCL INJECTION 0.25 MG/5ML
0.2500 mg | Freq: Once | INTRAVENOUS | Status: AC
  Administered 2021-07-15: 0.25 mg via INTRAVENOUS
  Filled 2021-07-15: qty 5

## 2021-07-15 NOTE — Progress Notes (Signed)
[facility] Psychosocial Distress Screening Clinical Social Work  Clinical Social Work was referred by distress screening protocol.  The patient scored a 8 on the Psychosocial Distress Thermometer which indicates moderate distress. Clinical Social Worker  met w patient in infusion  to assess for distress and other psychosocial needs. CSW is aware of patient from prior encounters.  She has been on disability (SSDI) post injury for "many years."  She enrolled only in Medicare Part A when it was offered.  She did not enroll in Part B, D or an Advantage plan.  Due to the length of time from when Medicare became available to her and now, she faces substantial penalties for late enrollment.  In addition, having Medicare of any kind is blocking her from accessing any available drug assistance programs despite the fact that she only has Part A Medicare.  She has a community support person - [name].  [name] was going to check w [name] and help patient apply for Medicaid if possible (financially eligible).  [name] was also given the number to [place] to determine if Legal Aid can help her obtain adequate insurance.  She states she was not aware of the need to enroll in Part B and D in a timely manner, was surprised when she found out the amount of the penalty she now owes in order to enroll.    She also has a cancer insurance policy - her husband is bringing this to [facility] and she would like someone to help her complete this paperwork. CSW awaiting call from husband that he has arrived w this paperwork.  She can enroll in [facility] Transportation if needed.  At present family members are assisting her to get to appointments  She has been linked w the [name] for any available assistance.  She has already been referred to [facility] and has gotten one disbursement of [employer].  ONCBCN DISTRESS SCREENING 07/14/2021  Screening Type   Distress experienced in past week (1-10) 8   Practical problem type Housing;Insurance;Transportation  Physical Problem type     Clinical Social Worker follow up needed: Yes.    If yes, follow up plan:  continue to follow to assist as possible  Beverely Pace, [place], [place] Worker Phone:  [phone]

## 2021-07-15 NOTE — Patient Instructions (Signed)
[facility]COL[facility]  Discharge Instructions: Thank you for choosing [facility] to provide your oncology and hematology care.   If you have a lab appointment with the [facility], please go directly to the [facility] and check in at the registration area.   Wear comfortable clothing and clothing appropriate for easy access to any Portacath or PICC line.   We strive to give you quality time with your provider. You may need to reschedule your appointment if you arrive late (15 or more minutes).  Arriving late affects you and other patients whose appointments are after yours.  Also, if you miss three or more appointments without notifying the office, you may be dismissed from the clinic at the provider's discretion.      For prescription refill requests, have your pharmacy contact our office and allow 72 hours for refills to be completed.    Today you received the following chemotherapy and/or immunotherapy agents: Keytruda, Alimta, Carboplatin      To help prevent nausea and vomiting after your treatment, we encourage you to take your nausea medication as directed.  BELOW ARE SYMPTOMS THAT SHOULD BE REPORTED IMMEDIATELY: *FEVER GREATER THAN 100.4 F (38 C) OR HIGHER *CHILLS OR SWEATING *NAUSEA AND VOMITING THAT IS NOT CONTROLLED WITH YOUR NAUSEA MEDICATION *UNUSUAL SHORTNESS OF BREATH *UNUSUAL BRUISING OR BLEEDING *URINARY PROBLEMS (pain or burning when urinating, or frequent urination) *BOWEL PROBLEMS (unusual diarrhea, constipation, pain near the anus) TENDERNESS IN MOUTH AND THROAT WITH OR WITHOUT PRESENCE OF ULCERS (sore throat, sores in mouth, or a toothache) UNUSUAL RASH, SWELLING OR PAIN  UNUSUAL VAGINAL DISCHARGE OR ITCHING   Items with * indicate a potential emergency and should be followed up as soon as possible or go to the Emergency Department if any problems should occur.  Please show the CHEMOTHERAPY ALERT CARD or IMMUNOTHERAPY ALERT  CARD at check-in to the Emergency Department and triage nurse.  Should you have questions after your visit or need to cancel or reschedule your appointment, please contact [facility]  Dept: [phone]  and follow the prompts.  Office hours are 8:00 a.m. to 4:30 p.m. Monday - Friday. Please note that voicemails left after 4:00 p.m. may not be returned until the following business day.  We are closed weekends and major holidays. You have access to a nurse at all times for urgent questions. Please call the main number to the clinic Dept: [phone] and follow the prompts.   For any non-urgent questions, you may also contact your provider using MyChart. We now offer e-Visits for anyone 23 and older to request care online for non-urgent symptoms. For details visit [email or web address].   Also download the MyChart app! Go to the app store, search "MyChart", open the app, select [facility], and log in with your MyChart username and password.  Due to Covid, a mask is required upon entering the hospital/clinic. If you do not have a mask, one will be given to you upon arrival. For doctor visits, patients may have 1 support person aged 63 or older with them. For treatment visits, patients cannot have anyone with them due to current Covid guidelines and our immunocompromised population.   Pembrolizumab injection What is this medication? PEMBROLIZUMAB (pem broe liz ue mab) is a monoclonal antibody. It is used to treat certain types of cancer. This medicine may be used for other purposes; ask your health care provider or pharmacist if you have questions. COMMON BRAND NAME(S):  Keytruda What should I tell my care team before I take this medication? They need to know if you have any of these conditions: autoimmune diseases like Crohn's disease, ulcerative colitis, or lupus have had or planning to have an allogeneic stem cell transplant (uses someone else's stem  cells) history of organ transplant history of chest radiation nervous system problems like myasthenia gravis or Guillain-Barre syndrome an unusual or allergic reaction to pembrolizumab, other medicines, foods, dyes, or preservatives pregnant or trying to get pregnant breast-feeding How should I use this medication? This medicine is for infusion into a vein. It is given by a health care professional in a hospital or clinic setting. A special MedGuide will be given to you before each treatment. Be sure to read this information carefully each time. Talk to your pediatrician regarding the use of this medicine in children. While this drug may be prescribed for children as young as 6 months for selected conditions, precautions do apply. Overdosage: If you think you have taken too much of this medicine contact a poison control center or emergency room at once. NOTE: This medicine is only for you. Do not share this medicine with others. What if I miss a dose? It is important not to miss your dose. Call your doctor or health care professional if you are unable to keep an appointment. What may interact with this medication? Interactions have not been studied. This list may not describe all possible interactions. Give your health care provider a list of all the medicines, herbs, non-prescription drugs, or dietary supplements you use. Also tell them if you smoke, drink alcohol, or use illegal drugs. Some items may interact with your medicine. What should I watch for while using this medication? Your condition will be monitored carefully while you are receiving this medicine. You may need blood work done while you are taking this medicine. Do not become pregnant while taking this medicine or for 4 months after stopping it. Women should inform their doctor if they wish to become pregnant or think they might be pregnant. There is a potential for serious side effects to an unborn child. Talk to your health care  professional or pharmacist for more information. Do not breast-feed an infant while taking this medicine or for 4 months after the last dose. What side effects may I notice from receiving this medication? Side effects that you should report to your doctor or health care professional as soon as possible: allergic reactions like skin rash, itching or hives, swelling of the face, lips, or tongue bloody or black, tarry breathing problems changes in vision chest pain chills confusion constipation cough diarrhea dizziness or feeling faint or lightheaded fast or irregular heartbeat fever flushing joint pain low blood counts - this medicine may decrease the number of white blood cells, red blood cells and platelets. You may be at increased risk for infections and bleeding. muscle pain muscle weakness pain, tingling, numbness in the hands or feet persistent headache redness, blistering, peeling or loosening of the skin, including inside the mouth signs and symptoms of high blood sugar such as dizziness; dry mouth; dry skin; fruity breath; nausea; stomach pain; increased hunger or thirst; increased urination signs and symptoms of kidney injury like trouble passing urine or change in the amount of urine signs and symptoms of liver injury like dark urine, light-colored stools, loss of appetite, nausea, right upper belly pain, yellowing of the eyes or skin sweating swollen lymph nodes weight loss Side effects that usually do not  require medical attention (report to your doctor or health care professional if they continue or are bothersome): decreased appetite hair loss tiredness This list may not describe all possible side effects. Call your doctor for medical advice about side effects. You may report side effects to FDA at 1-800-FDA-1088. Where should I keep my medication? This drug is given in a hospital or clinic and will not be stored at home. NOTE: This sheet is a summary. It may not  cover all possible information. If you have questions about this medicine, talk to your doctor, pharmacist, or health care provider.  2022 Elsevier/Gold Standard (2019-08-22 21:44:53) Pemetrexed injection What is this medication? PEMETREXED (PEM e TREX ed) is a chemotherapy drug used to treat lung cancers like non-small cell lung cancer and mesothelioma. It may also be used to treat other cancers. This medicine may be used for other purposes; ask your health care provider or pharmacist if you have questions. COMMON BRAND NAME(S): Alimta, PEMFEXY What should I tell my care team before I take this medication? They need to know if you have any of these conditions: infection (especially a virus infection such as chickenpox, cold sores, or herpes) kidney disease low blood counts, like low white cell, platelet, or red cell counts lung or breathing disease, like asthma radiation therapy an unusual or allergic reaction to pemetrexed, other medicines, foods, dyes, or preservative pregnant or trying to get pregnant breast-feeding How should I use this medication? This drug is given as an infusion into a vein. It is administered in a hospital or clinic by a specially trained health care professional. Talk to your pediatrician regarding the use of this medicine in children. Special care may be needed. Overdosage: If you think you have taken too much of this medicine contact a poison control center or emergency room at once. NOTE: This medicine is only for you. Do not share this medicine with others. What if I miss a dose? It is important not to miss your dose. Call your doctor or health care professional if you are unable to keep an appointment. What may interact with this medication? This medicine may interact with the following medications: Ibuprofen This list may not describe all possible interactions. Give your health care provider a list of all the medicines, herbs, non-prescription drugs, or  dietary supplements you use. Also tell them if you smoke, drink alcohol, or use illegal drugs. Some items may interact with your medicine. What should I watch for while using this medication? Visit your doctor for checks on your progress. This drug may make you feel generally unwell. This is not uncommon, as chemotherapy can affect healthy cells as well as cancer cells. Report any side effects. Continue your course of treatment even though you feel ill unless your doctor tells you to stop. In some cases, you may be given additional medicines to help with side effects. Follow all directions for their use. Call your doctor or health care professional for advice if you get a fever, chills or sore throat, or other symptoms of a cold or flu. Do not treat yourself. This drug decreases your body's ability to fight infections. Try to avoid being around people who are sick. This medicine may increase your risk to bruise or bleed. Call your doctor or health care professional if you notice any unusual bleeding. Be careful brushing and flossing your teeth or using a toothpick because you may get an infection or bleed more easily. If you have any dental work done, tell  your dentist you are receiving this medicine. Avoid taking products that contain aspirin, acetaminophen, ibuprofen, naproxen, or ketoprofen unless instructed by your doctor. These medicines may hide a fever. Call your doctor or health care professional if you get diarrhea or mouth sores. Do not treat yourself. To protect your kidneys, drink water or other fluids as directed while you are taking this medicine. Do not become pregnant while taking this medicine or for 6 months after stopping it. Women should inform their doctor if they wish to become pregnant or think they might be pregnant. Men should not father a child while taking this medicine and for 3 months after stopping it. This may interfere with the ability to father a child. You should talk to  your doctor or health care professional if you are concerned about your fertility. There is a potential for serious side effects to an unborn child. Talk to your health care professional or pharmacist for more information. Do not breast-feed an infant while taking this medicine or for 1 week after stopping it. What side effects may I notice from receiving this medication? Side effects that you should report to your doctor or health care professional as soon as possible: allergic reactions like skin rash, itching or hives, swelling of the face, lips, or tongue breathing problems redness, blistering, peeling or loosening of the skin, including inside the mouth signs and symptoms of bleeding such as bloody or black, tarry stools; red or dark-brown urine; spitting up blood or brown material that looks like coffee grounds; red spots on the skin; unusual bruising or bleeding from the eye, gums, or nose signs and symptoms of infection like fever or chills; cough; sore throat; pain or trouble passing urine signs and symptoms of kidney injury like trouble passing urine or change in the amount of urine signs and symptoms of liver injury like dark yellow or brown urine; general ill feeling or flu-like symptoms; light-colored stools; loss of appetite; nausea; right upper belly pain; unusually weak or tired; yellowing of the eyes or skin Side effects that usually do not require medical attention (report to your doctor or health care professional if they continue or are bothersome): constipation mouth sores nausea, vomiting unusually weak or tired This list may not describe all possible side effects. Call your doctor for medical advice about side effects. You may report side effects to FDA at 1-800-FDA-1088. Where should I keep my medication? This drug is given in a hospital or clinic and will not be stored at home. NOTE: This sheet is a summary. It may not cover all possible information. If you have questions  about this medicine, talk to your doctor, pharmacist, or health care provider.  2022 Elsevier/Gold Standard (2017-11-09 16:11:33) Carboplatin injection What is this medication? CARBOPLATIN (KAR boe pla tin) is a chemotherapy drug. It targets fast dividing cells, like cancer cells, and causes these cells to die. This medicine is used to treat ovarian cancer and many other cancers. This medicine may be used for other purposes; ask your health care provider or pharmacist if you have questions. COMMON BRAND NAME(S): Paraplatin What should I tell my care team before I take this medication? They need to know if you have any of these conditions: blood disorders hearing problems kidney disease recent or ongoing radiation therapy an unusual or allergic reaction to carboplatin, cisplatin, other chemotherapy, other medicines, foods, dyes, or preservatives pregnant or trying to get pregnant breast-feeding How should I use this medication? This drug is usually given as  an infusion into a vein. It is administered in a hospital or clinic by a specially trained health care professional. Talk to your pediatrician regarding the use of this medicine in children. Special care may be needed. Overdosage: If you think you have taken too much of this medicine contact a poison control center or emergency room at once. NOTE: This medicine is only for you. Do not share this medicine with others. What if I miss a dose? It is important not to miss a dose. Call your doctor or health care professional if you are unable to keep an appointment. What may interact with this medication? medicines for seizures medicines to increase blood counts like filgrastim, pegfilgrastim, sargramostim some antibiotics like amikacin, gentamicin, neomycin, streptomycin, tobramycin vaccines Talk to your doctor or health care professional before taking any of these medicines: acetaminophen aspirin ibuprofen ketoprofen naproxen This  list may not describe all possible interactions. Give your health care provider a list of all the medicines, herbs, non-prescription drugs, or dietary supplements you use. Also tell them if you smoke, drink alcohol, or use illegal drugs. Some items may interact with your medicine. What should I watch for while using this medication? Your condition will be monitored carefully while you are receiving this medicine. You will need important blood work done while you are taking this medicine. This drug may make you feel generally unwell. This is not uncommon, as chemotherapy can affect healthy cells as well as cancer cells. Report any side effects. Continue your course of treatment even though you feel ill unless your doctor tells you to stop. In some cases, you may be given additional medicines to help with side effects. Follow all directions for their use. Call your doctor or health care professional for advice if you get a fever, chills or sore throat, or other symptoms of a cold or flu. Do not treat yourself. This drug decreases your body's ability to fight infections. Try to avoid being around people who are sick. This medicine may increase your risk to bruise or bleed. Call your doctor or health care professional if you notice any unusual bleeding. Be careful brushing and flossing your teeth or using a toothpick because you may get an infection or bleed more easily. If you have any dental work done, tell your dentist you are receiving this medicine. Avoid taking products that contain aspirin, acetaminophen, ibuprofen, naproxen, or ketoprofen unless instructed by your doctor. These medicines may hide a fever. Do not become pregnant while taking this medicine. Women should inform their doctor if they wish to become pregnant or think they might be pregnant. There is a potential for serious side effects to an unborn child. Talk to your health care professional or pharmacist for more information. Do not  breast-feed an infant while taking this medicine. What side effects may I notice from receiving this medication? Side effects that you should report to your doctor or health care professional as soon as possible: allergic reactions like skin rash, itching or hives, swelling of the face, lips, or tongue signs of infection - fever or chills, cough, sore throat, pain or difficulty passing urine signs of decreased platelets or bleeding - bruising, pinpoint red spots on the skin, black, tarry stools, nosebleeds signs of decreased red blood cells - unusually weak or tired, fainting spells, lightheadedness breathing problems changes in hearing changes in vision chest pain high blood pressure low blood counts - This drug may decrease the number of white blood cells, red blood cells and  platelets. You may be at increased risk for infections and bleeding. nausea and vomiting pain, swelling, redness or irritation at the injection site pain, tingling, numbness in the hands or feet problems with balance, talking, walking trouble passing urine or change in the amount of urine Side effects that usually do not require medical attention (report to your doctor or health care professional if they continue or are bothersome): hair loss loss of appetite metallic taste in the mouth or changes in taste This list may not describe all possible side effects. Call your doctor for medical advice about side effects. You may report side effects to FDA at 1-800-FDA-1088. Where should I keep my medication? This drug is given in a hospital or clinic and will not be stored at home. NOTE: This sheet is a summary. It may not cover all possible information. If you have questions about this medicine, talk to your doctor, pharmacist, or health care provider.  2022 Elsevier/Gold Standard (2007-12-26 14:38:05)

## 2021-07-15 NOTE — Progress Notes (Signed)
Per Cassie, PA, ok to treat with elevated heart rate.

## 2021-07-15 NOTE — Progress Notes (Signed)
Met w/ pt to introduce myself as her Arboriculturist.  Unfortunately there aren't any foundations offering copay assistance for her Dx and the type of ins she has.  I informed her of the [employer], went over what it covers, gave her the income requirement and an expense sheet.  She would like to apply so she will bring her proof of income on 07/16/21.  She has my card for any questions or concerns she may have in the future.

## 2021-07-16 ENCOUNTER — Encounter: Payer: Self-pay | Admitting: General Practice

## 2021-07-16 ENCOUNTER — Inpatient Hospital Stay: Payer: 59 | Admitting: General Practice

## 2021-07-16 ENCOUNTER — Ambulatory Visit
Admission: RE | Admit: 2021-07-16 | Discharge: 2021-07-16 | Disposition: A | Discharge status: Home / Self Care | Payer: 59 | Source: Ambulatory Visit | Attending: Radiation Oncology | Admitting: Radiation Oncology

## 2021-07-16 ENCOUNTER — Encounter: Payer: Self-pay | Admitting: Internal Medicine

## 2021-07-16 ENCOUNTER — Telehealth: Payer: Self-pay | Admitting: Medical Oncology

## 2021-07-16 ENCOUNTER — Other Ambulatory Visit: Payer: Self-pay

## 2021-07-16 DIAGNOSIS — Z5111 Encounter for antineoplastic chemotherapy: Secondary | ICD-10-CM | POA: Diagnosis not present

## 2021-07-16 LAB — GUARDANT 360

## 2021-07-16 NOTE — Progress Notes (Signed)
Pt is approved for the $1000 [employer] grant.  

## 2021-07-16 NOTE — Progress Notes (Signed)
[facility] CSW Progress Notes  Call to support person, [name], follow up on resources given to help patient explore options for additional Medicare options.  She has called [name], patient is not eligible due to being over income.  Friend has not been able to contact [employer] but will do so.  She also recommended patient be linked to [employer], CSW will also refer to [employer] [place].  Edwyna Shell, LCSW Clinical Social Worker Phone:  [phone]

## 2021-07-16 NOTE — Telephone Encounter (Signed)
"   I think I have a yeast infection in the front of my private area".  She asked for a rx. I told to try over the counter medication but she insisted on getting rx.   I instructed her to contact her PCP for evaluation/treatment and I transferred her call to Dr . Zigmund Daniel. Message sent to Dr. Zigmund Daniel.

## 2021-07-17 ENCOUNTER — Ambulatory Visit
Admission: RE | Admit: 2021-07-17 | Discharge: 2021-07-17 | Disposition: A | Discharge status: Home / Self Care | Payer: 59 | Source: Ambulatory Visit | Attending: Radiation Oncology | Admitting: Radiation Oncology

## 2021-07-17 ENCOUNTER — Encounter: Payer: Self-pay | Admitting: Student

## 2021-07-17 ENCOUNTER — Inpatient Hospital Stay: Payer: 59 | Admitting: General Practice

## 2021-07-17 DIAGNOSIS — Z5111 Encounter for antineoplastic chemotherapy: Secondary | ICD-10-CM | POA: Diagnosis not present

## 2021-07-17 DIAGNOSIS — C7931 Secondary malignant neoplasm of brain: Secondary | ICD-10-CM

## 2021-07-17 MED ORDER — FLUCONAZOLE 100 MG PO TABS: ORAL_TABLET | ORAL | 0 refills | Status: DC

## 2021-07-17 NOTE — Progress Notes (Signed)
Has armband been applied?  Yes.    Does patient have an allergy to IV contrast dye?: No.   Has patient ever received premedication for IV contrast dye?: No.   Does patient take metformin?: No.  If patient does take metformin when was the last dose: none  Date of lab work: July 16, 2021 BUN: 13 CR: 0.51  IV site: antecubital left, condition patent and no redness  Has IV site been added to flowsheet?  Yes.    BP 114/68 (BP Location: Right Arm, Patient Position: Sitting, Cuff Size: Normal)   Pulse 92   Temp (!) 97.2 F (36.2 C)   Resp 20   Ht 5\' 5"  (1.651 m)   SpO2 99%   BMI 20.52 kg/m

## 2021-07-20 ENCOUNTER — Ambulatory Visit
Admission: RE | Admit: 2021-07-20 | Discharge: 2021-07-20 | Disposition: A | Discharge status: Home / Self Care | Payer: 59 | Source: Ambulatory Visit | Attending: Radiation Oncology | Admitting: Radiation Oncology

## 2021-07-20 ENCOUNTER — Other Ambulatory Visit: Payer: Self-pay

## 2021-07-20 DIAGNOSIS — Z5111 Encounter for antineoplastic chemotherapy: Secondary | ICD-10-CM | POA: Diagnosis not present

## 2021-07-21 ENCOUNTER — Telehealth: Payer: Self-pay | Admitting: Student

## 2021-07-21 ENCOUNTER — Ambulatory Visit
Admission: RE | Admit: 2021-07-21 | Discharge: 2021-07-21 | Disposition: A | Discharge status: Home / Self Care | Payer: 59 | Source: Ambulatory Visit | Attending: Radiation Oncology | Admitting: Radiation Oncology

## 2021-07-21 DIAGNOSIS — Z5111 Encounter for antineoplastic chemotherapy: Secondary | ICD-10-CM | POA: Diagnosis not present

## 2021-07-21 NOTE — Telephone Encounter (Signed)
   Kim Ross DOB: [birth year] MRN: [id number]   RIDER WAIVER AND RELEASE OF LIABILITY  For purposes of improving physical access to our facilities, [facility] is pleased to partner with third parties to provide [facility] patients or other authorized individuals the option of convenient, on-demand ground transportation services (the [place]") through use of the technology service that enables users to request on-demand ground transportation from independent third-party providers.  By opting to use and accept these [employer], I, the undersigned, hereby agree on behalf of myself, and on behalf of any minor child using the Government social research officer for whom I am the parent or legal guardian, as follows:  Government social research officer provided to me are provided by independent third-party transportation providers who are not [employer] or employees and who are unaffiliated with [employer]. [facility] is neither a transportation carrier nor a common or public carrier. [facility] has no control over the quality or safety of the transportation that occurs as a result of the [employer]. [facility] cannot guarantee that any third-party transportation provider will complete any arranged transportation service. [facility] makes no representation, warranty, or guarantee regarding the reliability, timeliness, quality, safety, suitability, or availability of any of the Transport Services or that they will be error free. I fully understand that traveling by vehicle involves risks and dangers of serious bodily injury, including permanent disability, paralysis, and death. I agree, on behalf of myself and on behalf of any minor child using the Transport Services for whom I am the parent or legal guardian, that the entire risk arising out of my use of the [employer] remains solely with me, to the maximum extent permitted under applicable law. The [employer] are provided "as  is" and "as available." [facility] disclaims all representations and warranties, express, implied or statutory, not expressly set out in these terms, including the implied warranties of merchantability and fitness for a particular purpose. I hereby waive and release [facility], its agents, employees, officers, directors, representatives, insurers, attorneys, assigns, successors, subsidiaries, and affiliates from any and all past, present, or future claims, demands, liabilities, actions, causes of action, or suits of any kind directly or indirectly arising from acceptance and use of the [employer]. I further waive and release [facility] and its affiliates from all present and future liability and responsibility for any injury or death to persons or damages to property caused by or related to the use of the [employer]. I have read this Waiver and Release of Liability, and I understand the terms used in it and their legal significance. This Waiver is freely and voluntarily given with the understanding that my right (as well as the right of any minor child for whom I am the parent or legal guardian using the [employer]) to legal recourse against [place] in connection with the [employer] is knowingly surrendered in return for use of these services.   I attest that I read the consent document to Derry Lory, gave Ms. Bilodeau the opportunity to ask questions and answered the questions asked (if any). I affirm that [place] then provided consent for she's participation in this program.     Legrand Pitts

## 2021-07-22 ENCOUNTER — Ambulatory Visit
Admission: RE | Admit: 2021-07-22 | Discharge: 2021-07-22 | Disposition: A | Discharge status: Home / Self Care | Payer: 59 | Source: Ambulatory Visit | Attending: Radiation Oncology | Admitting: Radiation Oncology

## 2021-07-22 ENCOUNTER — Inpatient Hospital Stay: Payer: 59

## 2021-07-22 ENCOUNTER — Other Ambulatory Visit: Payer: Self-pay

## 2021-07-22 DIAGNOSIS — Z5111 Encounter for antineoplastic chemotherapy: Secondary | ICD-10-CM | POA: Diagnosis not present

## 2021-07-22 DIAGNOSIS — C3492 Malignant neoplasm of unspecified part of left bronchus or lung: Secondary | ICD-10-CM

## 2021-07-22 LAB — CBC WITH DIFFERENTIAL (CANCER CENTER ONLY)
Abs Immature Granulocytes: 0.16 10*3/uL — ABNORMAL HIGH (ref 0.00–0.07)
Basophils Absolute: 0 10*3/uL (ref 0.0–0.1)
Basophils Relative: 0 %
Eosinophils Absolute: 0.1 10*3/uL (ref 0.0–0.5)
Eosinophils Relative: 1 %
HCT: 29.5 % — ABNORMAL LOW (ref 36.0–46.0)
Hemoglobin: 9.9 g/dL — ABNORMAL LOW (ref 12.0–15.0)
Immature Granulocytes: 2 %
Lymphocytes Relative: 7 %
Lymphs Abs: 0.6 10*3/uL — ABNORMAL LOW (ref 0.7–4.0)
MCH: 27.7 pg (ref 26.0–34.0)
MCHC: 33.6 g/dL (ref 30.0–36.0)
MCV: 82.4 fL (ref 80.0–100.0)
Monocytes Absolute: 0.2 10*3/uL (ref 0.1–1.0)
Monocytes Relative: 2 %
Neutro Abs: 6.5 10*3/uL (ref 1.7–7.7)
Neutrophils Relative %: 88 %
Platelet Count: 245 10*3/uL (ref 150–400)
RBC: 3.58 MIL/uL — ABNORMAL LOW (ref 3.87–5.11)
RDW: 15.6 % — ABNORMAL HIGH (ref 11.5–15.5)
WBC Count: 7.5 10*3/uL (ref 4.0–10.5)
nRBC: 0 % (ref 0.0–0.2)

## 2021-07-22 LAB — CMP (CANCER CENTER ONLY)
ALT: 11 U/L (ref 0–44)
AST: 14 U/L — ABNORMAL LOW (ref 15–41)
Albumin: 1.8 g/dL — ABNORMAL LOW (ref 3.5–5.0)
Alkaline Phosphatase: 194 U/L — ABNORMAL HIGH (ref 38–126)
Anion gap: 12 (ref 5–15)
BUN: 10 mg/dL (ref 8–23)
CO2: 23 mmol/L (ref 22–32)
Calcium: 9 mg/dL (ref 8.9–10.3)
Chloride: 100 mmol/L (ref 98–111)
Creatinine: 0.51 mg/dL (ref 0.44–1.00)
GFR, Estimated: >60 mL/min (ref >60)
Glucose, Bld: 134 mg/dL — ABNORMAL HIGH (ref 70–99)
Potassium: 3.7 mmol/L (ref 3.5–5.1)
Sodium: 135 mmol/L (ref 135–145)
Total Bilirubin: 0.7 mg/dL (ref 0.3–1.2)
Total Protein: 6.7 g/dL (ref 6.5–8.1)

## 2021-07-23 ENCOUNTER — Telehealth: Payer: Self-pay | Admitting: General Practice

## 2021-07-23 ENCOUNTER — Other Ambulatory Visit: Payer: Self-pay

## 2021-07-23 ENCOUNTER — Telehealth: Payer: Self-pay

## 2021-07-23 ENCOUNTER — Ambulatory Visit
Admission: RE | Admit: 2021-07-23 | Discharge: 2021-07-23 | Disposition: A | Discharge status: Home / Self Care | Payer: 59 | Source: Ambulatory Visit | Attending: Radiation Oncology | Admitting: Radiation Oncology

## 2021-07-23 DIAGNOSIS — Z5111 Encounter for antineoplastic chemotherapy: Secondary | ICD-10-CM | POA: Diagnosis not present

## 2021-07-23 NOTE — Telephone Encounter (Signed)
[facility] CSW Progress Notes  Call to patient, she is agreeable to being referred to Legal Aid for any possible help w her Medicare insurance issues.  Referral placed.  She is also concerned about paperwork for her cancer insurance policy - advised that this is handled by another department at [facility] and they will notify her when paperwork is ready for her to pick up and submit to insurance carrier.  Edwyna Shell, LCSW Clinical Social Worker Phone:  [phone]

## 2021-07-23 NOTE — Telephone Encounter (Signed)
Patient notified of completion of Cancer Claim Form. Copy mailed to [employer] and to Patient as requested. Patient advised to request copies of itemized billing statements from [facility] Department. Understanding verbalized. No other needs voiced at this time.

## 2021-07-24 ENCOUNTER — Other Ambulatory Visit: Payer: Self-pay | Admitting: Radiation Oncology

## 2021-07-24 ENCOUNTER — Ambulatory Visit: Payer: 59

## 2021-07-24 ENCOUNTER — Telehealth: Payer: Self-pay | Admitting: Radiology

## 2021-07-24 DIAGNOSIS — C3432 Malignant neoplasm of lower lobe, left bronchus or lung: Secondary | ICD-10-CM

## 2021-07-24 MED ORDER — HYDROCODONE-ACETAMINOPHEN 5-325 MG PO TABS: 1.0000 | ORAL_TABLET | Freq: Four times a day (QID) | ORAL | 0 refills | Status: DC | PRN

## 2021-07-24 MED ORDER — FENTANYL 50 MCG/HR TD PT72: 1.0000 | MEDICATED_PATCH | TRANSDERMAL | 0 refills | Status: DC

## 2021-07-24 NOTE — Telephone Encounter (Signed)
Significant other states patient will be unable to go for treatment today due to pain. He requests a refill of fentanyl patches and hydrocodone.

## 2021-07-27 ENCOUNTER — Ambulatory Visit
Admission: RE | Admit: 2021-07-27 | Discharge: 2021-07-27 | Disposition: A | Discharge status: Home / Self Care | Payer: 59 | Source: Ambulatory Visit | Attending: Radiation Oncology | Admitting: Radiation Oncology

## 2021-07-27 ENCOUNTER — Other Ambulatory Visit: Payer: Self-pay

## 2021-07-27 ENCOUNTER — Inpatient Hospital Stay: Payer: 59

## 2021-07-27 VITALS — BP 122/90 | HR 102 | Temp 97.5°F | Resp 20

## 2021-07-27 DIAGNOSIS — Z5111 Encounter for antineoplastic chemotherapy: Secondary | ICD-10-CM | POA: Diagnosis not present

## 2021-07-27 DIAGNOSIS — C7931 Secondary malignant neoplasm of brain: Secondary | ICD-10-CM

## 2021-07-27 NOTE — Progress Notes (Signed)
Nurse monitoring complete status post 1 of 1 SRS treatments. Patient without complaints. Patient denies new or worsening neurologic symptoms. Vitals stable. Instructed patient to avoid strenuous activity for the next 24 hours.  Instructed patient to call [phone] with needs related to treatment after hours or over the weekend. Patient and husband verbalized understanding/agreement. Patient assisted out of clinic to via wheelchair to husband's vehicle without incident  Vitals:   07/27/21 1617  BP: 122/90  Pulse: (!) 102  Resp: 20  Temp: (!) 97.5 F (36.4 C)  SpO2: 97%

## 2021-07-27 NOTE — Op Note (Addendum)
  Name: Kim Ross  MRN: [id number]  Date: 07/27/2021   DOB: Sep 30, 1956  Stereotactic Radiosurgery Operative Note  PRE-OPERATIVE DIAGNOSIS:  Metastatic lung cancer  POST-OPERATIVE DIAGNOSIS:  Same  PROCEDURE:  Stereotactic Radiosurgery  SURGEON:  Judith Part, MD  NARRATIVE: The patient underwent a radiation treatment planning session in the radiation oncology simulation suite under the care of the radiation oncology physician and physicist.  I participated closely in the radiation treatment planning afterwards. The patient underwent planning CT which was fused to 3T high resolution MRI with 1 mm axial slices.  These images were fused on the planning system.  We contoured the gross target volumes and subsequently expanded this to yield the Planning Target Volume. I actively participated in the planning process.  I helped to define and review the target contours and also the contours of the optic pathway, eyes, brainstem and selected nearby organs at risk.  All the dose constraints for critical structures were reviewed and compared to AAPM Task Group 101.  The prescription dose conformity was reviewed.  I approved the plan electronically.    Accordingly, Kim Ross was brought to the TrueBeam stereotactic radiation treatment linac and placed in the custom immobilization mask.  The patient was aligned according to the IR fiducial markers with BrainLab Exactrac, then orthogonal x-rays were used in ExacTrac with the 6DOF robotic table and the shifts were made to align the patient  Kim Ross received stereotactic radiosurgery uneventfully.    Lesions treated:  1   Complex lesions treated:  0 (>3.5 cm, <10mm of optic path, or within the brainstem)   The detailed description of the procedure is recorded in the radiation oncology procedure note.  I was present for the duration of the procedure.  DISPOSITION:  Following delivery, the patient was transported to nursing in stable  condition and monitored for possible acute effects to be discharged to home in stable condition with follow-up in one month.  Judith Part, MD 07/27/2021 4:01 PM

## 2021-07-27 NOTE — Progress Notes (Addendum)
Nutrition Assessment   Reason for Assessment:   Weight loss, poor appetite   ASSESSMENT:  65 year old female with  lung cancer metastatic to brain.  Past medical history of drug use, UTI, smoker.  Patient receiving radiation and planning to start chemotherapy (missed education class so delay in starting).    Met with patient and husband/significant other in office prior to radiation.  Patient requesting blanket and given blanket by our Chaplain.  Patient with little to say during visit.  Kept eyes closed during most of visit and says that she does not feel good.  Yesterday patient was able to eat 1/2 carton of yogurt, 4 spoonfuls of sherbet, small amount of breakfast bar.  1/3rd of ensure shake.  Patient's appetite has been decreased for the last month.  Significant other says that she has a hard time swallowing foods and sometimes pills.      Nutrition Focused Physical Exam: deferred as patient requesting blanket to get warm   Medications: compazine, fotate, metformin, MVI   Labs: reviewed   Anthropometrics:   Height: 65 inches Weight: 116 lb today in RD office Per patient 45-50 lb weight loss in the last 2 months Noted 161 lb on 11/28/20 BMI: 19  27% weight loss in the last 8 months per chart,  significant  Estimated Energy Needs  Kcals: 1600-1800 Protein: 80-90 g Fluid: 1.6 L   NUTRITION DIAGNOSIS: Inadequate oral intake related to cancer, COVID, hospitalization as evidenced by 27% weight loss in the last 8 months and eating less than 75% of estimated energy needs in the last month.    MALNUTRITION DIAGNOSIS: Patient meets criteria for severe malnutrition in context of chronic illness as evidenced by 27% weight loss in the last 8 months and eating < 75% of estimated energy intake for the last month.    INTERVENTION:  Patient may benefit from trial of appetite stimulant.  Message sent to provider Encouraged small frequent "nibbles" q 1-2 hours. Discussed ways to add  calories and protein in diet. Handout provided Complimentary case of ensure enlive given to patient.  Encouraged 350 calorie shakes or higher.   Recipes given on high calorie shakes and high calorie foods.  Discussed soft, moist foods for ease of swallowing and handout provided.  Contact information provided   MONITORING, EVALUATION, GOAL: weight trends, intake   Next Visit: Wednesday, Nov 9th prior to labs  [facility]. Zenia Resides, [place], [place] Registered Dietitian [phone] (mobile)

## 2021-07-28 ENCOUNTER — Ambulatory Visit
Admission: RE | Admit: 2021-07-28 | Discharge: 2021-07-28 | Disposition: A | Discharge status: Home / Self Care | Payer: 59 | Source: Ambulatory Visit | Attending: Radiation Oncology | Admitting: Radiation Oncology

## 2021-07-28 DIAGNOSIS — Z5111 Encounter for antineoplastic chemotherapy: Secondary | ICD-10-CM | POA: Diagnosis not present

## 2021-07-28 NOTE — Progress Notes (Signed)
  Radiation Oncology         ([phone]) [phone] ________________________________  Name: Kim Ross MRN: [id number]  Date: 07/27/2021  DOB: 03/12/1956  Stereotactic Treatment Procedure Note  SPECIAL TREATMENT PROCEDURE  Outpatient    ICD-10-CM   1. Brain metastasis ([place])  C79.31       3D TREATMENT PLANNING AND DOSIMETRY:  The patient's radiation plan was reviewed and approved by neurosurgery and radiation oncology prior to treatment.  It showed 3-dimensional radiation distributions overlaid onto the planning CT/MRI image set.  The [facility] for the target structures as well as the organs at risk were reviewed. The documentation of the 3D plan and dosimetry are filed in the radiation oncology EMR.  NARRATIVE:  Kim Ross was brought to the TrueBeam stereotactic radiation treatment machine and placed supine on the CT couch. The head frame was applied, and the patient was set up for stereotactic radiosurgery.  Neurosurgery was present for the set-up and delivery  SIMULATION VERIFICATION:  In the couch zero-angle position, the patient underwent Exactrac imaging using the Brainlab system with orthogonal KV images.  These were carefully aligned and repeated to confirm treatment position for each of the isocenters.  The Exactrac snap film verification was repeated at each couch angle.  SPECIAL TREATMENT PROCEDURE: Kim Ross received stereotactic radiosurgery to the following targets: Right frontal 1cm target was treated using radiosurgical technique, 20 Gray in 1 fraction.  6 MV photons, flattening filter free, were used. ExacTrac Snap verification was performed for each couch angle.  This constitutes a special treatment procedure due to the ablative dose delivered and the technical nature of treatment.  This highly technical modality of treatment ensures that the ablative dose is centered on the patient's tumor while sparing normal tissues from excessive dose and risk of detrimental  effects.  STEREOTACTIC TREATMENT MANAGEMENT:  Following delivery, the patient was transported to nursing in stable condition and monitored for possible acute effects.  Vital signs were recorded BP 122/90 (BP Location: Right Arm, Patient Position: Sitting, Cuff Size: Normal)   Pulse (!) 102   Temp (!) 97.5 F (36.4 C)   Resp 20   SpO2 97% . The patient tolerated treatment without significant acute effects, and was discharged to home in stable condition.    PLAN: Continue radiation treatments to the chest.  Follow-up in one month otherwise to monitor healing from brain treatment.  ________________________________   Eppie Gibson, MD

## 2021-07-29 ENCOUNTER — Encounter: Payer: Self-pay | Admitting: General Practice

## 2021-07-29 ENCOUNTER — Inpatient Hospital Stay: Payer: 59

## 2021-07-29 ENCOUNTER — Ambulatory Visit
Admission: RE | Admit: 2021-07-29 | Discharge: 2021-07-29 | Disposition: A | Discharge status: Home / Self Care | Payer: 59 | Source: Ambulatory Visit | Attending: Radiation Oncology | Admitting: Radiation Oncology

## 2021-07-29 DIAGNOSIS — Z5111 Encounter for antineoplastic chemotherapy: Secondary | ICD-10-CM | POA: Diagnosis not present

## 2021-07-29 NOTE — Progress Notes (Signed)
[facility] CSW Progress Notes  Email from [employer], [employer]. Patient has refused scheduled rides yesterday and today.  They will not be able to schedule further rides until patient is wiling to accept this help from [employer].  CSW will inform radiation team of this decision.    Edwyna Shell, LCSW Clinical Social Worker Phone:  [phone]

## 2021-07-30 ENCOUNTER — Ambulatory Visit: Payer: 59

## 2021-07-30 ENCOUNTER — Ambulatory Visit
Admission: RE | Admit: 2021-07-30 | Discharge: 2021-07-30 | Disposition: A | Discharge status: Home / Self Care | Payer: 59 | Source: Ambulatory Visit | Attending: Radiation Oncology | Admitting: Radiation Oncology

## 2021-07-30 DIAGNOSIS — Z5111 Encounter for antineoplastic chemotherapy: Secondary | ICD-10-CM | POA: Diagnosis not present

## 2021-07-31 ENCOUNTER — Ambulatory Visit
Admission: RE | Admit: 2021-07-31 | Discharge: 2021-07-31 | Disposition: A | Discharge status: Home / Self Care | Payer: 59 | Source: Ambulatory Visit | Attending: Radiation Oncology | Admitting: Radiation Oncology

## 2021-07-31 ENCOUNTER — Ambulatory Visit: Payer: 59

## 2021-07-31 ENCOUNTER — Other Ambulatory Visit: Payer: Self-pay

## 2021-07-31 DIAGNOSIS — Z5111 Encounter for antineoplastic chemotherapy: Secondary | ICD-10-CM | POA: Diagnosis not present
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
## 2021-08-03 ENCOUNTER — Ambulatory Visit: Payer: 59

## 2021-08-04 ENCOUNTER — Inpatient Hospital Stay: Payer: 59 | Attending: Physician Assistant

## 2021-08-04 ENCOUNTER — Ambulatory Visit
Admission: RE | Admit: 2021-08-04 | Discharge: 2021-08-04 | Disposition: A | Discharge status: Home / Self Care | Payer: 59 | Source: Ambulatory Visit | Attending: Radiation Oncology | Admitting: Radiation Oncology

## 2021-08-04 ENCOUNTER — Other Ambulatory Visit: Payer: Self-pay

## 2021-08-04 ENCOUNTER — Inpatient Hospital Stay (HOSPITAL_BASED_OUTPATIENT_CLINIC_OR_DEPARTMENT_OTHER): Payer: 59 | Admitting: Physician Assistant

## 2021-08-04 ENCOUNTER — Encounter: Payer: Self-pay | Admitting: Physician Assistant

## 2021-08-04 ENCOUNTER — Encounter: Payer: Self-pay | Admitting: Radiation Oncology

## 2021-08-04 ENCOUNTER — Inpatient Hospital Stay: Payer: 59

## 2021-08-04 VITALS — BP 107/74 | HR 116 | Temp 99.0°F | Resp 16 | Ht 65.0 in

## 2021-08-04 DIAGNOSIS — E43 Unspecified severe protein-calorie malnutrition: Secondary | ICD-10-CM | POA: Diagnosis not present

## 2021-08-04 DIAGNOSIS — Z87440 Personal history of urinary (tract) infections: Secondary | ICD-10-CM | POA: Insufficient documentation

## 2021-08-04 DIAGNOSIS — F1010 Alcohol abuse, uncomplicated: Secondary | ICD-10-CM | POA: Insufficient documentation

## 2021-08-04 DIAGNOSIS — D6481 Anemia due to antineoplastic chemotherapy: Secondary | ICD-10-CM | POA: Insufficient documentation

## 2021-08-04 DIAGNOSIS — Z8701 Personal history of pneumonia (recurrent): Secondary | ICD-10-CM | POA: Insufficient documentation

## 2021-08-04 DIAGNOSIS — C3412 Malignant neoplasm of upper lobe, left bronchus or lung: Secondary | ICD-10-CM | POA: Diagnosis not present

## 2021-08-04 DIAGNOSIS — T451X5A Adverse effect of antineoplastic and immunosuppressive drugs, initial encounter: Secondary | ICD-10-CM | POA: Insufficient documentation

## 2021-08-04 DIAGNOSIS — Z5112 Encounter for antineoplastic immunotherapy: Secondary | ICD-10-CM | POA: Insufficient documentation

## 2021-08-04 DIAGNOSIS — C3492 Malignant neoplasm of unspecified part of left bronchus or lung: Secondary | ICD-10-CM

## 2021-08-04 DIAGNOSIS — C7931 Secondary malignant neoplasm of brain: Secondary | ICD-10-CM | POA: Insufficient documentation

## 2021-08-04 DIAGNOSIS — Z510 Encounter for antineoplastic radiation therapy: Secondary | ICD-10-CM | POA: Insufficient documentation

## 2021-08-04 DIAGNOSIS — C3432 Malignant neoplasm of lower lobe, left bronchus or lung: Secondary | ICD-10-CM

## 2021-08-04 DIAGNOSIS — Z79899 Other long term (current) drug therapy: Secondary | ICD-10-CM | POA: Diagnosis not present

## 2021-08-04 DIAGNOSIS — Z5111 Encounter for antineoplastic chemotherapy: Secondary | ICD-10-CM | POA: Insufficient documentation

## 2021-08-04 DIAGNOSIS — R000 Tachycardia, unspecified: Secondary | ICD-10-CM

## 2021-08-04 DIAGNOSIS — C7951 Secondary malignant neoplasm of bone: Secondary | ICD-10-CM | POA: Diagnosis not present

## 2021-08-04 LAB — CMP (CANCER CENTER ONLY)
ALT: 12 U/L (ref 0–44)
AST: 18 U/L (ref 15–41)
Albumin: 1.7 g/dL — ABNORMAL LOW (ref 3.5–5.0)
Alkaline Phosphatase: 240 U/L — ABNORMAL HIGH (ref 38–126)
Anion gap: 11 (ref 5–15)
BUN: 9 mg/dL (ref 8–23)
CO2: 25 mmol/L (ref 22–32)
Calcium: 8.6 mg/dL — ABNORMAL LOW (ref 8.9–10.3)
Chloride: 97 mmol/L — ABNORMAL LOW (ref 98–111)
Creatinine: 0.54 mg/dL (ref 0.44–1.00)
GFR, Estimated: >60 mL/min (ref >60)
Glucose, Bld: 142 mg/dL — ABNORMAL HIGH (ref 70–99)
Potassium: 3.8 mmol/L (ref 3.5–5.1)
Sodium: 133 mmol/L — ABNORMAL LOW (ref 135–145)
Total Bilirubin: 0.4 mg/dL (ref 0.3–1.2)
Total Protein: 6.7 g/dL (ref 6.5–8.1)

## 2021-08-04 LAB — CBC WITH DIFFERENTIAL (CANCER CENTER ONLY)
Abs Immature Granulocytes: 0.42 10*3/uL — ABNORMAL HIGH (ref 0.00–0.07)
Basophils Absolute: 0 10*3/uL (ref 0.0–0.1)
Basophils Relative: 0 %
Eosinophils Absolute: 0 10*3/uL (ref 0.0–0.5)
Eosinophils Relative: 0 %
HCT: 30.7 % — ABNORMAL LOW (ref 36.0–46.0)
Hemoglobin: 10 g/dL — ABNORMAL LOW (ref 12.0–15.0)
Immature Granulocytes: 3 %
Lymphocytes Relative: 3 %
Lymphs Abs: 0.5 10*3/uL — ABNORMAL LOW (ref 0.7–4.0)
MCH: 27.5 pg (ref 26.0–34.0)
MCHC: 32.6 g/dL (ref 30.0–36.0)
MCV: 84.3 fL (ref 80.0–100.0)
Monocytes Absolute: 1 10*3/uL (ref 0.1–1.0)
Monocytes Relative: 7 %
Neutro Abs: 13.7 10*3/uL — ABNORMAL HIGH (ref 1.7–7.7)
Neutrophils Relative %: 87 %
Platelet Count: 385 10*3/uL (ref 150–400)
RBC: 3.64 MIL/uL — ABNORMAL LOW (ref 3.87–5.11)
RDW: 18 % — ABNORMAL HIGH (ref 11.5–15.5)
WBC Count: 15.7 10*3/uL — ABNORMAL HIGH (ref 4.0–10.5)
nRBC: 0 % (ref 0.0–0.2)

## 2021-08-04 LAB — TSH: TSH: 1.905 u[IU]/mL (ref 0.308–3.960)

## 2021-08-04 MED ORDER — HYDROCODONE-ACETAMINOPHEN 5-325 MG PO TABS: 1.0000 | ORAL_TABLET | Freq: Four times a day (QID) | ORAL | 0 refills | Status: DC | PRN

## 2021-08-04 MED ORDER — SODIUM CHLORIDE 0.9 % IV SOLN
10.0000 mg | Freq: Once | INTRAVENOUS | Status: AC
  Administered 2021-08-04: 10 mg via INTRAVENOUS
  Filled 2021-08-04: qty 10

## 2021-08-04 MED ORDER — SODIUM CHLORIDE 0.9 % IV SOLN
200.0000 mg | Freq: Once | INTRAVENOUS | Status: AC
  Administered 2021-08-04: 200 mg via INTRAVENOUS
  Filled 2021-08-04: qty 8

## 2021-08-04 MED ORDER — FENTANYL 50 MCG/HR TD PT72: 1.0000 | MEDICATED_PATCH | TRANSDERMAL | 0 refills | Status: DC

## 2021-08-04 MED ORDER — SODIUM CHLORIDE 0.9 % IV SOLN
500.0000 mg/m2 | Freq: Once | INTRAVENOUS | Status: AC
  Administered 2021-08-04: 800 mg via INTRAVENOUS
  Filled 2021-08-04: qty 20

## 2021-08-04 MED ORDER — SODIUM CHLORIDE 0.9 % IV SOLN: Freq: Once | INTRAVENOUS | Status: AC

## 2021-08-04 MED ORDER — PALONOSETRON HCL INJECTION 0.25 MG/5ML
0.2500 mg | Freq: Once | INTRAVENOUS | Status: AC
  Administered 2021-08-04: 0.25 mg via INTRAVENOUS
  Filled 2021-08-04: qty 5

## 2021-08-04 MED ORDER — SODIUM CHLORIDE 0.9 % IV SOLN
442.5000 mg | Freq: Once | INTRAVENOUS | Status: AC
  Administered 2021-08-04: 440 mg via INTRAVENOUS
  Filled 2021-08-04: qty 44

## 2021-08-04 MED ORDER — SODIUM CHLORIDE 0.9 % IV SOLN
150.0000 mg | Freq: Once | INTRAVENOUS | Status: AC
  Administered 2021-08-04: 150 mg via INTRAVENOUS
  Filled 2021-08-04: qty 150

## 2021-08-04 NOTE — Progress Notes (Signed)
Per Cassandra, PA ok to treat with elevated HR.

## 2021-08-04 NOTE — Patient Instructions (Signed)
[facility]  Discharge Instructions: Thank you for choosing [facility] to provide your oncology and hematology care.   If you have a lab appointment with the [facility], please go directly to the [facility] and check in at the registration area.   Wear comfortable clothing and clothing appropriate for easy access to any Portacath or PICC line.   We strive to give you quality time with your provider. You may need to reschedule your appointment if you arrive late (15 or more minutes).  Arriving late affects you and other patients whose appointments are after yours.  Also, if you miss three or more appointments without notifying the office, you may be dismissed from the clinic at the provider's discretion.      For prescription refill requests, have your pharmacy contact our office and allow 72 hours for refills to be completed.    Today you received the following chemotherapy and/or immunotherapy agents Keytruda, Alimta, and carboplatin      To help prevent nausea and vomiting after your treatment, we encourage you to take your nausea medication as directed.  BELOW ARE SYMPTOMS THAT SHOULD BE REPORTED IMMEDIATELY: *FEVER GREATER THAN 100.4 F (38 C) OR HIGHER *CHILLS OR SWEATING *NAUSEA AND VOMITING THAT IS NOT CONTROLLED WITH YOUR NAUSEA MEDICATION *UNUSUAL SHORTNESS OF BREATH *UNUSUAL BRUISING OR BLEEDING *URINARY PROBLEMS (pain or burning when urinating, or frequent urination) *BOWEL PROBLEMS (unusual diarrhea, constipation, pain near the anus) TENDERNESS IN MOUTH AND THROAT WITH OR WITHOUT PRESENCE OF ULCERS (sore throat, sores in mouth, or a toothache) UNUSUAL RASH, SWELLING OR PAIN  UNUSUAL VAGINAL DISCHARGE OR ITCHING   Items with * indicate a potential emergency and should be followed up as soon as possible or go to the Emergency Department if any problems should occur.  Please show the CHEMOTHERAPY ALERT CARD or IMMUNOTHERAPY  ALERT CARD at check-in to the Emergency Department and triage nurse.  Should you have questions after your visit or need to cancel or reschedule your appointment, please contact [facility]  Dept: [phone]  and follow the prompts.  Office hours are 8:00 a.m. to 4:30 p.m. Monday - Friday. Please note that voicemails left after 4:00 p.m. may not be returned until the following business day.  We are closed weekends and major holidays. You have access to a nurse at all times for urgent questions. Please call the main number to the clinic Dept: [phone] and follow the prompts.   For any non-urgent questions, you may also contact your provider using MyChart. We now offer e-Visits for anyone 38 and older to request care online for non-urgent symptoms. For details visit [email or web address].   Also download the MyChart app! Go to the app store, search "MyChart", open the app, select [facility], and log in with your MyChart username and password.  Due to Covid, a mask is required upon entering the hospital/clinic. If you do not have a mask, one will be given to you upon arrival. For doctor visits, patients may have 1 support person aged 63 or older with them. For treatment visits, patients cannot have anyone with them due to current Covid guidelines and our immunocompromised population.

## 2021-08-05 ENCOUNTER — Inpatient Hospital Stay: Payer: 59

## 2021-08-06 ENCOUNTER — Other Ambulatory Visit: Payer: Self-pay | Admitting: Family Medicine

## 2021-08-06 ENCOUNTER — Other Ambulatory Visit: Payer: Self-pay | Admitting: Physician Assistant

## 2021-08-06 DIAGNOSIS — L853 Xerosis cutis: Secondary | ICD-10-CM

## 2021-08-06 DIAGNOSIS — C3432 Malignant neoplasm of lower lobe, left bronchus or lung: Secondary | ICD-10-CM

## 2021-08-09 ENCOUNTER — Other Ambulatory Visit: Payer: Self-pay | Admitting: Physician Assistant

## 2021-08-09 DIAGNOSIS — D72829 Elevated white blood cell count, unspecified: Secondary | ICD-10-CM

## 2021-08-12 ENCOUNTER — Inpatient Hospital Stay: Payer: 59 | Admitting: Dietician

## 2021-08-12 ENCOUNTER — Inpatient Hospital Stay: Payer: 59

## 2021-08-14 ENCOUNTER — Telehealth: Payer: Self-pay | Admitting: Physician Assistant

## 2021-08-14 ENCOUNTER — Telehealth: Payer: Self-pay | Admitting: Emergency Medicine

## 2021-08-14 ENCOUNTER — Telehealth: Payer: Self-pay

## 2021-08-14 NOTE — Telephone Encounter (Signed)
The patient's niece has called and asked for general understanding of her aunt's health as the patient and her husband are unable to explain her health condition. The patient's niece is listed on her HIPPA/release of information form. I called the patient's niece to discuss; however, her voicemail is full and I was unable to leave a message. I would be happy to talk to her if she returns the call.

## 2021-08-14 NOTE — Telephone Encounter (Signed)
I have called and LM on VM for the pt to call back 

## 2021-08-14 NOTE — Telephone Encounter (Signed)
I'm sorry but there is nothing I can order on top of what she is using. If she is having breakthrough pain then she will have to seek emergency care, go to the ED.

## 2021-08-14 NOTE — Telephone Encounter (Signed)
Spoke with the pt  She states that she has continued to have left side back and "lung pain"  She says that this has been progressively worse over the past month  She is noticing that it gets worse when she lies down and now isn't able to sleep much  She denies any SOB, cough, wheezing, fevers  She says that her pain is at a 10 on scale 1-10 and it's achydull  She takes hydrocodone and also has a duragesic patch and it's not helping  Dr Lamonte Sakai, any recs?  Allergies  Allergen Reactions   Ibuprofen Nausea And Vomiting   Trazodone And Nefazodone     AM Dizziness & Drowsiness

## 2021-08-14 NOTE — Telephone Encounter (Signed)
Opened in error

## 2021-08-17 ENCOUNTER — Other Ambulatory Visit: Payer: Self-pay | Admitting: Radiation Oncology

## 2021-08-17 DIAGNOSIS — C7931 Secondary malignant neoplasm of brain: Secondary | ICD-10-CM

## 2021-08-17 NOTE — Telephone Encounter (Signed)
Called and spoke with patient, advised her of the recommendations per Dr. Lamonte Sakai.  She verbalized understanding.  Nothing further needed.

## 2021-08-18 ENCOUNTER — Other Ambulatory Visit: Payer: Self-pay | Admitting: *Deleted

## 2021-08-18 ENCOUNTER — Telehealth: Payer: Self-pay | Admitting: Internal Medicine

## 2021-08-18 NOTE — Telephone Encounter (Signed)
Scheduled per sch msg. Called and left msg  

## 2021-08-19 ENCOUNTER — Telehealth: Payer: Self-pay

## 2021-08-19 ENCOUNTER — Telehealth: Payer: Self-pay | Admitting: Physician Assistant

## 2021-08-19 ENCOUNTER — Telehealth: Payer: Self-pay | Admitting: *Deleted

## 2021-08-19 ENCOUNTER — Other Ambulatory Visit: Payer: Self-pay | Admitting: Physician Assistant

## 2021-08-19 ENCOUNTER — Other Ambulatory Visit: Payer: Self-pay

## 2021-08-19 ENCOUNTER — Inpatient Hospital Stay: Payer: 59

## 2021-08-19 DIAGNOSIS — C3492 Malignant neoplasm of unspecified part of left bronchus or lung: Secondary | ICD-10-CM

## 2021-08-19 DIAGNOSIS — D649 Anemia, unspecified: Secondary | ICD-10-CM

## 2021-08-19 DIAGNOSIS — Z5111 Encounter for antineoplastic chemotherapy: Secondary | ICD-10-CM | POA: Diagnosis not present

## 2021-08-19 LAB — CBC WITH DIFFERENTIAL (CANCER CENTER ONLY)
Abs Immature Granulocytes: 0.03 10*3/uL (ref 0.00–0.07)
Basophils Absolute: 0 10*3/uL (ref 0.0–0.1)
Basophils Relative: 0 %
Eosinophils Absolute: 0 10*3/uL (ref 0.0–0.5)
Eosinophils Relative: 1 %
HCT: 24 % — ABNORMAL LOW (ref 36.0–46.0)
Hemoglobin: 7.9 g/dL — ABNORMAL LOW (ref 12.0–15.0)
Immature Granulocytes: 1 %
Lymphocytes Relative: 21 %
Lymphs Abs: 0.5 10*3/uL — ABNORMAL LOW (ref 0.7–4.0)
MCH: 27.8 pg (ref 26.0–34.0)
MCHC: 32.9 g/dL (ref 30.0–36.0)
MCV: 84.5 fL (ref 80.0–100.0)
Monocytes Absolute: 0.4 10*3/uL (ref 0.1–1.0)
Monocytes Relative: 19 %
Neutro Abs: 1.3 10*3/uL — ABNORMAL LOW (ref 1.7–7.7)
Neutrophils Relative %: 58 %
Platelet Count: 44 10*3/uL — ABNORMAL LOW (ref 150–400)
RBC: 2.84 MIL/uL — ABNORMAL LOW (ref 3.87–5.11)
RDW: 17.9 % — ABNORMAL HIGH (ref 11.5–15.5)
WBC Count: 2.3 10*3/uL — ABNORMAL LOW (ref 4.0–10.5)
nRBC: 0 % (ref 0.0–0.2)

## 2021-08-19 LAB — CMP (CANCER CENTER ONLY)
ALT: 14 U/L (ref 0–44)
AST: 21 U/L (ref 15–41)
Albumin: 1.8 g/dL — ABNORMAL LOW (ref 3.5–5.0)
Alkaline Phosphatase: 235 U/L — ABNORMAL HIGH (ref 38–126)
Anion gap: 13 (ref 5–15)
BUN: 16 mg/dL (ref 8–23)
CO2: 24 mmol/L (ref 22–32)
Calcium: 8.6 mg/dL — ABNORMAL LOW (ref 8.9–10.3)
Chloride: 95 mmol/L — ABNORMAL LOW (ref 98–111)
Creatinine: 0.62 mg/dL (ref 0.44–1.00)
GFR, Estimated: >60 mL/min (ref >60)
Glucose, Bld: 125 mg/dL — ABNORMAL HIGH (ref 70–99)
Potassium: 3.5 mmol/L (ref 3.5–5.1)
Sodium: 132 mmol/L — ABNORMAL LOW (ref 135–145)
Total Bilirubin: 0.4 mg/dL (ref 0.3–1.2)
Total Protein: 6.9 g/dL (ref 6.5–8.1)

## 2021-08-19 NOTE — Telephone Encounter (Signed)
TCT patient's home and spoke with her SO, Kim Ross. Informed him that Dezzie will need to come back tomorrow for a type and cross and to get 1 unit of blood.  She is to be here at 10:30 for labs and then go to [facility] for the transfusion.  He voiced understanding. He states she will not be able to make the 9am appt for patient education.

## 2021-08-19 NOTE — Telephone Encounter (Signed)
Pts niece LM requesting a return call. I have attempted to call her back and was not able to LM as her VM box is full.

## 2021-08-20 ENCOUNTER — Inpatient Hospital Stay: Payer: 59

## 2021-08-20 DIAGNOSIS — D649 Anemia, unspecified: Secondary | ICD-10-CM

## 2021-08-20 DIAGNOSIS — C3492 Malignant neoplasm of unspecified part of left bronchus or lung: Secondary | ICD-10-CM

## 2021-08-20 DIAGNOSIS — Z5111 Encounter for antineoplastic chemotherapy: Secondary | ICD-10-CM | POA: Diagnosis not present

## 2021-08-20 LAB — ABO/RH: ABO/RH(D): B POS

## 2021-08-20 LAB — PREPARE RBC (CROSSMATCH)

## 2021-08-20 MED ORDER — SODIUM CHLORIDE 0.9% IV SOLUTION
250.0000 mL | Freq: Once | INTRAVENOUS | Status: AC
  Administered 2021-08-20: 12:00:00 250 mL via INTRAVENOUS

## 2021-08-20 MED ORDER — DIPHENHYDRAMINE HCL 25 MG PO CAPS
25.0000 mg | ORAL_CAPSULE | Freq: Once | ORAL | Status: AC
  Administered 2021-08-20: 12:00:00 25 mg via ORAL
  Filled 2021-08-20: qty 1

## 2021-08-20 MED ORDER — ACETAMINOPHEN 325 MG PO TABS
650.0000 mg | ORAL_TABLET | Freq: Once | ORAL | Status: AC
  Administered 2021-08-20: 12:00:00 650 mg via ORAL
  Filled 2021-08-20: qty 2

## 2021-08-20 NOTE — Patient Instructions (Signed)
Blood Transfusion, Adult, Care After This sheet gives you information about how to care for yourself after your procedure. Your doctor may also give you more specific instructions. If you have problems or questions, contact your doctor. What can I expect after the procedure? After the procedure, it is common to have: Bruising and soreness at the IV site. A headache. Follow these instructions at home: Insertion site care   Follow instructions from your doctor about how to take care of your insertion site. This is where an IV tube was put into your vein. Make sure you: Wash your hands with soap and water before and after you change your bandage (dressing). If you cannot use soap and water, use hand sanitizer. Change your bandage as told by your doctor. Check your insertion site every day for signs of infection. Check for: Redness, swelling, or pain. Bleeding from the site. Warmth. Pus or a bad smell. General instructions Take over-the-counter and prescription medicines only as told by your doctor. Rest as told by your doctor. Go back to your normal activities as told by your doctor. Keep all follow-up visits as told by your doctor. This is important. Contact a doctor if: You have itching or red, swollen areas of skin (hives). You feel worried or nervous (anxious). You feel weak after doing your normal activities. You have redness, swelling, warmth, or pain around the insertion site. You have blood coming from the insertion site, and the blood does not stop with pressure. You have pus or a bad smell coming from the insertion site. Get help right away if: You have signs of a serious reaction. This may be coming from an allergy or the body's defense system (immune system). Signs include: Trouble breathing or shortness of breath. Swelling of the face or feeling warm (flushed). Fever or chills. Head, chest, or back pain. Dark pee (urine) or blood in the pee. Widespread rash. Fast  heartbeat. Feeling dizzy or light-headed. You may receive your blood transfusion in an outpatient setting. If so, you will be told whom to contact to report any reactions. These symptoms may be an emergency. Do not wait to see if the symptoms will go away. Get medical help right away. Call your local emergency services (911 in the [place] Do not drive yourself to the hospital. Summary Bruising and soreness at the IV site are common. Check your insertion site every day for signs of infection. Rest as told by your doctor. Go back to your normal activities as told by your doctor. Get help right away if you have signs of a serious reaction. This information is not intended to replace advice given to you by your health care provider. Make sure you discuss any questions you have with your health care provider. Document Revised: 01/15/2021 Document Reviewed: 03/15/2019 Elsevier Patient Education  2022 Elsevier Inc.  

## 2021-08-21 LAB — TYPE AND SCREEN
ABO/RH(D): B POS
Antibody Screen: NEGATIVE
Unit division: 0

## 2021-08-21 LAB — BPAM RBC
Blood Product Expiration Date: 202211172359
ISSUE DATE / TIME: 202211171304
Unit Type and Rh: 7300

## 2021-08-24 ENCOUNTER — Telehealth: Payer: Self-pay | Admitting: Medical Oncology

## 2021-08-24 NOTE — Telephone Encounter (Signed)
Pain med refill Fentanyl/Hydrocodone, diflucan, folic acid.

## 2021-08-25 ENCOUNTER — Telehealth: Payer: Self-pay

## 2021-08-25 ENCOUNTER — Encounter (HOSPITAL_COMMUNITY): Payer: Self-pay

## 2021-08-25 ENCOUNTER — Inpatient Hospital Stay: Payer: 59 | Admitting: Dietician

## 2021-08-25 ENCOUNTER — Encounter: Payer: Self-pay | Admitting: Internal Medicine

## 2021-08-25 ENCOUNTER — Inpatient Hospital Stay: Payer: 59

## 2021-08-25 ENCOUNTER — Emergency Department (HOSPITAL_COMMUNITY): Payer: Medicare Other

## 2021-08-25 ENCOUNTER — Other Ambulatory Visit: Payer: Self-pay

## 2021-08-25 ENCOUNTER — Other Ambulatory Visit: Payer: Self-pay | Admitting: Radiation Oncology

## 2021-08-25 ENCOUNTER — Observation Stay (HOSPITAL_COMMUNITY): Payer: Medicare Other

## 2021-08-25 ENCOUNTER — Ambulatory Visit (HOSPITAL_BASED_OUTPATIENT_CLINIC_OR_DEPARTMENT_OTHER): Payer: 59 | Admitting: Physician Assistant

## 2021-08-25 ENCOUNTER — Inpatient Hospital Stay (HOSPITAL_BASED_OUTPATIENT_CLINIC_OR_DEPARTMENT_OTHER): Payer: 59 | Admitting: Internal Medicine

## 2021-08-25 ENCOUNTER — Inpatient Hospital Stay (HOSPITAL_COMMUNITY)
Admission: EM | Admit: 2021-08-25 | Discharge: 2021-09-28 | DRG: 308 | Disposition: A | Discharge status: Skilled Nursing Facility | Payer: Medicare Other | Source: Ambulatory Visit | Attending: Family Medicine | Admitting: Family Medicine

## 2021-08-25 VITALS — BP 132/85 | HR 111 | Temp 96.1°F | Ht 65.0 in

## 2021-08-25 VITALS — HR 104

## 2021-08-25 DIAGNOSIS — I82452 Acute embolism and thrombosis of left peroneal vein: Secondary | ICD-10-CM | POA: Diagnosis present

## 2021-08-25 DIAGNOSIS — I700 Atherosclerosis of aorta: Secondary | ICD-10-CM | POA: Diagnosis present

## 2021-08-25 DIAGNOSIS — N132 Hydronephrosis with renal and ureteral calculous obstruction: Secondary | ICD-10-CM | POA: Diagnosis not present

## 2021-08-25 DIAGNOSIS — Z923 Personal history of irradiation: Secondary | ICD-10-CM

## 2021-08-25 DIAGNOSIS — Z86718 Personal history of other venous thrombosis and embolism: Secondary | ICD-10-CM

## 2021-08-25 DIAGNOSIS — R7303 Prediabetes: Secondary | ICD-10-CM

## 2021-08-25 DIAGNOSIS — Z809 Family history of malignant neoplasm, unspecified: Secondary | ICD-10-CM

## 2021-08-25 DIAGNOSIS — C7951 Secondary malignant neoplasm of bone: Secondary | ICD-10-CM | POA: Diagnosis present

## 2021-08-25 DIAGNOSIS — Z66 Do not resuscitate: Secondary | ICD-10-CM | POA: Diagnosis not present

## 2021-08-25 DIAGNOSIS — B370 Candidal stomatitis: Secondary | ICD-10-CM | POA: Diagnosis present

## 2021-08-25 DIAGNOSIS — D696 Thrombocytopenia, unspecified: Secondary | ICD-10-CM

## 2021-08-25 DIAGNOSIS — I82422 Acute embolism and thrombosis of left iliac vein: Secondary | ICD-10-CM | POA: Diagnosis present

## 2021-08-25 DIAGNOSIS — E11649 Type 2 diabetes mellitus with hypoglycemia without coma: Secondary | ICD-10-CM | POA: Diagnosis not present

## 2021-08-25 DIAGNOSIS — C7931 Secondary malignant neoplasm of brain: Secondary | ICD-10-CM

## 2021-08-25 DIAGNOSIS — E782 Mixed hyperlipidemia: Secondary | ICD-10-CM | POA: Diagnosis present

## 2021-08-25 DIAGNOSIS — I959 Hypotension, unspecified: Secondary | ICD-10-CM | POA: Diagnosis not present

## 2021-08-25 DIAGNOSIS — D638 Anemia in other chronic diseases classified elsewhere: Secondary | ICD-10-CM | POA: Diagnosis present

## 2021-08-25 DIAGNOSIS — C3482 Malignant neoplasm of overlapping sites of left bronchus and lung: Secondary | ICD-10-CM

## 2021-08-25 DIAGNOSIS — C3432 Malignant neoplasm of lower lobe, left bronchus or lung: Secondary | ICD-10-CM

## 2021-08-25 DIAGNOSIS — I82412 Acute embolism and thrombosis of left femoral vein: Secondary | ICD-10-CM | POA: Diagnosis present

## 2021-08-25 DIAGNOSIS — C3492 Malignant neoplasm of unspecified part of left bronchus or lung: Secondary | ICD-10-CM

## 2021-08-25 DIAGNOSIS — Z79899 Other long term (current) drug therapy: Secondary | ICD-10-CM

## 2021-08-25 DIAGNOSIS — Z5111 Encounter for antineoplastic chemotherapy: Secondary | ICD-10-CM | POA: Diagnosis not present

## 2021-08-25 DIAGNOSIS — R112 Nausea with vomiting, unspecified: Secondary | ICD-10-CM

## 2021-08-25 DIAGNOSIS — L89152 Pressure ulcer of sacral region, stage 2: Secondary | ICD-10-CM | POA: Diagnosis present

## 2021-08-25 DIAGNOSIS — Z7984 Long term (current) use of oral hypoglycemic drugs: Secondary | ICD-10-CM

## 2021-08-25 DIAGNOSIS — Z751 Person awaiting admission to adequate facility elsewhere: Secondary | ICD-10-CM

## 2021-08-25 DIAGNOSIS — D701 Agranulocytosis secondary to cancer chemotherapy: Secondary | ICD-10-CM | POA: Diagnosis not present

## 2021-08-25 DIAGNOSIS — R627 Adult failure to thrive: Secondary | ICD-10-CM | POA: Diagnosis present

## 2021-08-25 DIAGNOSIS — Z885 Allergy status to narcotic agent status: Secondary | ICD-10-CM

## 2021-08-25 DIAGNOSIS — E43 Unspecified severe protein-calorie malnutrition: Secondary | ICD-10-CM | POA: Diagnosis present

## 2021-08-25 DIAGNOSIS — I4891 Unspecified atrial fibrillation: Secondary | ICD-10-CM

## 2021-08-25 DIAGNOSIS — Z9889 Other specified postprocedural states: Secondary | ICD-10-CM

## 2021-08-25 DIAGNOSIS — D61818 Other pancytopenia: Secondary | ICD-10-CM | POA: Diagnosis not present

## 2021-08-25 DIAGNOSIS — K2100 Gastro-esophageal reflux disease with esophagitis, without bleeding: Secondary | ICD-10-CM | POA: Diagnosis present

## 2021-08-25 DIAGNOSIS — K449 Diaphragmatic hernia without obstruction or gangrene: Secondary | ICD-10-CM | POA: Diagnosis present

## 2021-08-25 DIAGNOSIS — E222 Syndrome of inappropriate secretion of antidiuretic hormone: Secondary | ICD-10-CM | POA: Diagnosis present

## 2021-08-25 DIAGNOSIS — R52 Pain, unspecified: Secondary | ICD-10-CM

## 2021-08-25 DIAGNOSIS — Y842 Radiological procedure and radiotherapy as the cause of abnormal reaction of the patient, or of later complication, without mention of misadventure at the time of the procedure: Secondary | ICD-10-CM | POA: Diagnosis present

## 2021-08-25 DIAGNOSIS — I82432 Acute embolism and thrombosis of left popliteal vein: Secondary | ICD-10-CM | POA: Diagnosis present

## 2021-08-25 DIAGNOSIS — I480 Paroxysmal atrial fibrillation: Secondary | ICD-10-CM | POA: Diagnosis not present

## 2021-08-25 DIAGNOSIS — C3490 Malignant neoplasm of unspecified part of unspecified bronchus or lung: Secondary | ICD-10-CM | POA: Diagnosis not present

## 2021-08-25 DIAGNOSIS — F32A Depression, unspecified: Secondary | ICD-10-CM | POA: Diagnosis present

## 2021-08-25 DIAGNOSIS — Z825 Family history of asthma and other chronic lower respiratory diseases: Secondary | ICD-10-CM

## 2021-08-25 DIAGNOSIS — L8990 Pressure ulcer of unspecified site, unspecified stage: Secondary | ICD-10-CM | POA: Insufficient documentation

## 2021-08-25 DIAGNOSIS — Z87440 Personal history of urinary (tract) infections: Secondary | ICD-10-CM

## 2021-08-25 DIAGNOSIS — R1310 Dysphagia, unspecified: Secondary | ICD-10-CM

## 2021-08-25 DIAGNOSIS — R0602 Shortness of breath: Secondary | ICD-10-CM

## 2021-08-25 DIAGNOSIS — E860 Dehydration: Secondary | ICD-10-CM | POA: Diagnosis not present

## 2021-08-25 DIAGNOSIS — J90 Pleural effusion, not elsewhere classified: Secondary | ICD-10-CM

## 2021-08-25 DIAGNOSIS — N320 Bladder-neck obstruction: Secondary | ICD-10-CM | POA: Diagnosis not present

## 2021-08-25 DIAGNOSIS — E876 Hypokalemia: Secondary | ICD-10-CM | POA: Diagnosis not present

## 2021-08-25 DIAGNOSIS — J910 Malignant pleural effusion: Secondary | ICD-10-CM | POA: Diagnosis not present

## 2021-08-25 DIAGNOSIS — I82442 Acute embolism and thrombosis of left tibial vein: Secondary | ICD-10-CM | POA: Diagnosis present

## 2021-08-25 DIAGNOSIS — F17210 Nicotine dependence, cigarettes, uncomplicated: Secondary | ICD-10-CM | POA: Diagnosis present

## 2021-08-25 DIAGNOSIS — T451X5A Adverse effect of antineoplastic and immunosuppressive drugs, initial encounter: Secondary | ICD-10-CM | POA: Diagnosis not present

## 2021-08-25 DIAGNOSIS — Z681 Body mass index (BMI) 19 or less, adult: Secondary | ICD-10-CM

## 2021-08-25 DIAGNOSIS — Z886 Allergy status to analgesic agent status: Secondary | ICD-10-CM

## 2021-08-25 DIAGNOSIS — Z20822 Contact with and (suspected) exposure to covid-19: Secondary | ICD-10-CM | POA: Diagnosis present

## 2021-08-25 DIAGNOSIS — K222 Esophageal obstruction: Secondary | ICD-10-CM | POA: Diagnosis present

## 2021-08-25 DIAGNOSIS — K5900 Constipation, unspecified: Secondary | ICD-10-CM | POA: Diagnosis not present

## 2021-08-25 HISTORY — DX: Type 2 diabetes mellitus without complications: E11.9

## 2021-08-25 HISTORY — DX: Mixed hyperlipidemia: E78.2

## 2021-08-25 HISTORY — DX: Bladder-neck obstruction: N32.0

## 2021-08-25 HISTORY — DX: Personal history of other venous thrombosis and embolism: Z86.718

## 2021-08-25 HISTORY — DX: Gastro-esophageal reflux disease without esophagitis: K21.9

## 2021-08-25 HISTORY — DX: Depression, unspecified: F32.A

## 2021-08-25 HISTORY — DX: Unspecified protein-calorie malnutrition: E46

## 2021-08-25 HISTORY — DX: Malignant pleural effusion: J91.0

## 2021-08-25 HISTORY — DX: Esophageal obstruction: K22.2

## 2021-08-25 HISTORY — DX: Secondary malignant neoplasm of brain: C79.31

## 2021-08-25 HISTORY — DX: Anxiety disorder, unspecified: F41.9

## 2021-08-25 HISTORY — DX: Malignant neoplasm of unspecified part of unspecified bronchus or lung: C34.90

## 2021-08-25 HISTORY — DX: Paroxysmal atrial fibrillation: I48.0

## 2021-08-25 LAB — CBC WITH DIFFERENTIAL/PLATELET
Abs Immature Granulocytes: 0.1 10*3/uL — ABNORMAL HIGH (ref 0.00–0.07)
Basophils Absolute: 0 10*3/uL (ref 0.0–0.1)
Basophils Relative: 0 %
Eosinophils Absolute: 0 10*3/uL (ref 0.0–0.5)
Eosinophils Relative: 0 %
HCT: 31.5 % — ABNORMAL LOW (ref 36.0–46.0)
Hemoglobin: 10.1 g/dL — ABNORMAL LOW (ref 12.0–15.0)
Immature Granulocytes: 1 %
Lymphocytes Relative: 4 %
Lymphs Abs: 0.5 10*3/uL — ABNORMAL LOW (ref 0.7–4.0)
MCH: 28.3 pg (ref 26.0–34.0)
MCHC: 32.1 g/dL (ref 30.0–36.0)
MCV: 88.2 fL (ref 80.0–100.0)
Monocytes Absolute: 0.3 10*3/uL (ref 0.1–1.0)
Monocytes Relative: 3 %
Neutro Abs: 10.3 10*3/uL — ABNORMAL HIGH (ref 1.7–7.7)
Neutrophils Relative %: 92 %
Platelets: 205 10*3/uL (ref 150–400)
RBC: 3.57 MIL/uL — ABNORMAL LOW (ref 3.87–5.11)
RDW: 18.7 % — ABNORMAL HIGH (ref 11.5–15.5)
WBC: 11.2 10*3/uL — ABNORMAL HIGH (ref 4.0–10.5)
nRBC: 0 % (ref 0.0–0.2)

## 2021-08-25 LAB — CBC WITH DIFFERENTIAL (CANCER CENTER ONLY)
Abs Immature Granulocytes: 0.12 10*3/uL — ABNORMAL HIGH (ref 0.00–0.07)
Basophils Absolute: 0 10*3/uL (ref 0.0–0.1)
Basophils Relative: 0 %
Eosinophils Absolute: 0 10*3/uL (ref 0.0–0.5)
Eosinophils Relative: 0 %
HCT: 31.7 % — ABNORMAL LOW (ref 36.0–46.0)
Hemoglobin: 10.3 g/dL — ABNORMAL LOW (ref 12.0–15.0)
Immature Granulocytes: 1 %
Lymphocytes Relative: 11 %
Lymphs Abs: 1.1 10*3/uL (ref 0.7–4.0)
MCH: 27.7 pg (ref 26.0–34.0)
MCHC: 32.5 g/dL (ref 30.0–36.0)
MCV: 85.2 fL (ref 80.0–100.0)
Monocytes Absolute: 1.2 10*3/uL — ABNORMAL HIGH (ref 0.1–1.0)
Monocytes Relative: 12 %
Neutro Abs: 7.3 10*3/uL (ref 1.7–7.7)
Neutrophils Relative %: 76 %
Platelet Count: 208 10*3/uL (ref 150–400)
RBC: 3.72 MIL/uL — ABNORMAL LOW (ref 3.87–5.11)
RDW: 18 % — ABNORMAL HIGH (ref 11.5–15.5)
WBC Count: 9.7 10*3/uL (ref 4.0–10.5)
nRBC: 0 % (ref 0.0–0.2)

## 2021-08-25 LAB — COMPREHENSIVE METABOLIC PANEL
ALT: 11 U/L (ref 0–44)
AST: 17 U/L (ref 15–41)
Albumin: 2.1 g/dL — ABNORMAL LOW (ref 3.5–5.0)
Alkaline Phosphatase: 262 U/L — ABNORMAL HIGH (ref 38–126)
Anion gap: 8 (ref 5–15)
BUN: 12 mg/dL (ref 8–23)
CO2: 26 mmol/L (ref 22–32)
Calcium: 7.9 mg/dL — ABNORMAL LOW (ref 8.9–10.3)
Chloride: 97 mmol/L — ABNORMAL LOW (ref 98–111)
Creatinine, Ser: 0.45 mg/dL (ref 0.44–1.00)
GFR, Estimated: >60 mL/min (ref >60)
Glucose, Bld: 115 mg/dL — ABNORMAL HIGH (ref 70–99)
Potassium: 3.5 mmol/L (ref 3.5–5.1)
Sodium: 131 mmol/L — ABNORMAL LOW (ref 135–145)
Total Bilirubin: 0.8 mg/dL (ref 0.3–1.2)
Total Protein: 6.8 g/dL (ref 6.5–8.1)

## 2021-08-25 LAB — CMP (CANCER CENTER ONLY)
ALT: 10 U/L (ref 0–44)
AST: 17 U/L (ref 15–41)
Albumin: 1.8 g/dL — ABNORMAL LOW (ref 3.5–5.0)
Alkaline Phosphatase: 301 U/L — ABNORMAL HIGH (ref 38–126)
Anion gap: 12 (ref 5–15)
BUN: 11 mg/dL (ref 8–23)
CO2: 24 mmol/L (ref 22–32)
Calcium: 8.2 mg/dL — ABNORMAL LOW (ref 8.9–10.3)
Chloride: 96 mmol/L — ABNORMAL LOW (ref 98–111)
Creatinine: 0.53 mg/dL (ref 0.44–1.00)
GFR, Estimated: >60 mL/min (ref >60)
Glucose, Bld: 147 mg/dL — ABNORMAL HIGH (ref 70–99)
Potassium: 3.3 mmol/L — ABNORMAL LOW (ref 3.5–5.1)
Sodium: 132 mmol/L — ABNORMAL LOW (ref 135–145)
Total Bilirubin: 0.4 mg/dL (ref 0.3–1.2)
Total Protein: 6.7 g/dL (ref 6.5–8.1)

## 2021-08-25 LAB — RESP PANEL BY RT-PCR (FLU A&B, COVID) ARPGX2
Influenza A by PCR: NEGATIVE
Influenza B by PCR: NEGATIVE
SARS Coronavirus 2 by RT PCR: NEGATIVE

## 2021-08-25 LAB — PROTIME-INR
INR: 1.3 — ABNORMAL HIGH (ref 0.8–1.2)
Prothrombin Time: 16 seconds — ABNORMAL HIGH (ref 11.4–15.2)

## 2021-08-25 LAB — TROPONIN I (HIGH SENSITIVITY)
Troponin I (High Sensitivity): 8 ng/L (ref <18)
Troponin I (High Sensitivity): 7 ng/L (ref <18)

## 2021-08-25 LAB — TSH: TSH: 5.54 u[IU]/mL — ABNORMAL HIGH (ref 0.308–3.960)

## 2021-08-25 LAB — APTT: aPTT: 189 seconds (ref 24–36)

## 2021-08-25 LAB — GLUCOSE, CAPILLARY: Glucose-Capillary: 141 mg/dL — ABNORMAL HIGH (ref 70–99)

## 2021-08-25 MED ORDER — SERTRALINE HCL 50 MG PO TABS
50.0000 mg | ORAL_TABLET | Freq: Every day | ORAL | Status: DC
  Administered 2021-08-26 – 2021-09-13 (×17): 50 mg via ORAL
  Filled 2021-08-25 (×18): qty 1

## 2021-08-25 MED ORDER — IOHEXOL 350 MG/ML SOLN
75.0000 mL | Freq: Once | INTRAVENOUS | Status: AC | PRN
  Administered 2021-08-25: 75 mL via INTRAVENOUS

## 2021-08-25 MED ORDER — CYANOCOBALAMIN 1000 MCG/ML IJ SOLN
1000.0000 ug | Freq: Once | INTRAMUSCULAR | Status: AC
  Administered 2021-08-25: 1000 ug via INTRAMUSCULAR
  Filled 2021-08-25: qty 1

## 2021-08-25 MED ORDER — INSULIN ASPART 100 UNIT/ML IJ SOLN
0.0000 [IU] | Freq: Three times a day (TID) | INTRAMUSCULAR | Status: DC
  Administered 2021-08-30 – 2021-09-21 (×10): 1 [IU] via SUBCUTANEOUS
  Administered 2021-09-21: 08:00:00 2 [IU] via SUBCUTANEOUS
  Administered 2021-09-22 – 2021-09-28 (×12): 1 [IU] via SUBCUTANEOUS

## 2021-08-25 MED ORDER — SODIUM CHLORIDE 0.9 % IV SOLN
442.5000 mg | Freq: Once | INTRAVENOUS | Status: AC
  Administered 2021-08-25: 440 mg via INTRAVENOUS
  Filled 2021-08-25: qty 44

## 2021-08-25 MED ORDER — MECLIZINE HCL 25 MG PO TABS
25.0000 mg | ORAL_TABLET | Freq: Two times a day (BID) | ORAL | Status: DC
  Administered 2021-08-25 – 2021-09-01 (×10): 25 mg via ORAL
  Filled 2021-08-25 (×16): qty 1

## 2021-08-25 MED ORDER — SODIUM CHLORIDE 0.9 % IV SOLN
10.0000 mg | Freq: Once | INTRAVENOUS | Status: AC
  Administered 2021-08-25: 10 mg via INTRAVENOUS
  Filled 2021-08-25: qty 10

## 2021-08-25 MED ORDER — PALONOSETRON HCL INJECTION 0.25 MG/5ML
0.2500 mg | Freq: Once | INTRAVENOUS | Status: AC
  Administered 2021-08-25: 0.25 mg via INTRAVENOUS
  Filled 2021-08-25: qty 5

## 2021-08-25 MED ORDER — SODIUM CHLORIDE 0.9 % IV SOLN: Freq: Once | INTRAVENOUS | Status: AC

## 2021-08-25 MED ORDER — SODIUM CHLORIDE 0.9 % IV SOLN
200.0000 mg | Freq: Once | INTRAVENOUS | Status: AC
  Administered 2021-08-25: 200 mg via INTRAVENOUS
  Filled 2021-08-25: qty 8

## 2021-08-25 MED ORDER — METOPROLOL TARTRATE 5 MG/5ML IV SOLN
INTRAVENOUS | Status: AC
  Administered 2021-08-25: 5 mg via INTRAVENOUS
  Filled 2021-08-25: qty 10

## 2021-08-25 MED ORDER — ADULT MULTIVITAMIN W/MINERALS CH
1.0000 | ORAL_TABLET | ORAL | Status: DC
  Administered 2021-08-26 – 2021-09-13 (×10): 1 via ORAL
  Filled 2021-08-25 (×11): qty 1

## 2021-08-25 MED ORDER — FENTANYL 50 MCG/HR TD PT72
1.0000 | MEDICATED_PATCH | TRANSDERMAL | Status: DC
  Administered 2021-08-26 – 2021-09-28 (×13): 1 via TRANSDERMAL
  Filled 2021-08-25 (×14): qty 1

## 2021-08-25 MED ORDER — LACTATED RINGERS IV BOLUS
1000.0000 mL | Freq: Once | INTRAVENOUS | Status: AC
  Administered 2021-08-25: 1000 mL via INTRAVENOUS

## 2021-08-25 MED ORDER — SODIUM CHLORIDE 0.9 % IV SOLN
500.0000 mg/m2 | Freq: Once | INTRAVENOUS | Status: AC
  Administered 2021-08-25: 800 mg via INTRAVENOUS
  Filled 2021-08-25: qty 20

## 2021-08-25 MED ORDER — HYDROCODONE-ACETAMINOPHEN 5-325 MG PO TABS
1.0000 | ORAL_TABLET | Freq: Four times a day (QID) | ORAL | Status: DC | PRN
  Administered 2021-08-25 – 2021-09-14 (×34): 1 via ORAL
  Filled 2021-08-25 (×37): qty 1

## 2021-08-25 MED ORDER — SODIUM CHLORIDE 0.9 % IV SOLN
150.0000 mg | Freq: Once | INTRAVENOUS | Status: AC
  Administered 2021-08-25: 150 mg via INTRAVENOUS
  Filled 2021-08-25: qty 150

## 2021-08-25 MED ORDER — HEPARIN BOLUS VIA INFUSION
3000.0000 [IU] | Freq: Once | INTRAVENOUS | Status: AC
  Administered 2021-08-25: 3000 [IU] via INTRAVENOUS
  Filled 2021-08-25: qty 3000

## 2021-08-25 MED ORDER — ENSURE ENLIVE PO LIQD
237.0000 mL | Freq: Two times a day (BID) | ORAL | Status: DC
  Administered 2021-08-25 – 2021-08-26 (×2): 237 mL via ORAL

## 2021-08-25 MED ORDER — DILTIAZEM HCL-DEXTROSE 125-5 MG/125ML-% IV SOLN (PREMIX)
5.0000 mg/h | INTRAVENOUS | Status: DC
  Filled 2021-08-25 (×2): qty 125

## 2021-08-25 MED ORDER — HEPARIN (PORCINE) 25000 UT/250ML-% IV SOLN
950.0000 [IU]/h | INTRAVENOUS | Status: DC
Start: 2021-08-25 — End: 2021-08-26
  Administered 2021-08-25: 950 [IU]/h via INTRAVENOUS
  Filled 2021-08-25: qty 250

## 2021-08-25 MED ORDER — FOLIC ACID 1 MG PO TABS
1.0000 mg | ORAL_TABLET | Freq: Every day | ORAL | Status: DC
  Administered 2021-08-26 – 2021-09-13 (×17): 1 mg via ORAL
  Filled 2021-08-25 (×18): qty 1

## 2021-08-25 MED ORDER — ATORVASTATIN CALCIUM 40 MG PO TABS
40.0000 mg | ORAL_TABLET | Freq: Every day | ORAL | Status: DC
  Administered 2021-08-26 – 2021-09-13 (×17): 40 mg via ORAL
  Filled 2021-08-25 (×19): qty 1

## 2021-08-25 MED ORDER — VANCOMYCIN HCL 1000 MG/200ML IV SOLN
1000.0000 mg | INTRAVENOUS | Status: DC
  Administered 2021-08-25: 1000 mg via INTRAVENOUS
  Filled 2021-08-25: qty 200

## 2021-08-25 MED ORDER — SODIUM CHLORIDE 0.9 % IV SOLN
2.0000 g | Freq: Three times a day (TID) | INTRAVENOUS | Status: DC
  Administered 2021-08-25 – 2021-08-27 (×6): 2 g via INTRAVENOUS
  Filled 2021-08-25 (×8): qty 2

## 2021-08-25 MED ORDER — METOPROLOL TARTRATE 5 MG/5ML IV SOLN: 5.0000 mg | Freq: Once | INTRAVENOUS | Status: AC

## 2021-08-25 MED ORDER — DILTIAZEM HCL 25 MG/5ML IV SOLN
10.0000 mg | Freq: Once | INTRAVENOUS | Status: DC
  Filled 2021-08-25: qty 5
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
## 2021-08-25 NOTE — Patient Instructions (Signed)
[facility]  Discharge Instructions: Thank you for choosing [facility] to provide your oncology and hematology care.   If you have a lab appointment with the [facility], please go directly to the [facility] and check in at the registration area.   Wear comfortable clothing and clothing appropriate for easy access to any Portacath or PICC line.   We strive to give you quality time with your provider. You may need to reschedule your appointment if you arrive late (15 or more minutes).  Arriving late affects you and other patients whose appointments are after yours.  Also, if you miss three or more appointments without notifying the office, you may be dismissed from the clinic at the provider's discretion.      For prescription refill requests, have your pharmacy contact our office and allow 72 hours for refills to be completed.    Today you received the following chemotherapy and/or immunotherapy agents: Keytruda, Alimta, Carboplatin   To help prevent nausea and vomiting after your treatment, we encourage you to take your nausea medication as directed.  BELOW ARE SYMPTOMS THAT SHOULD BE REPORTED IMMEDIATELY: *FEVER GREATER THAN 100.4 F (38 C) OR HIGHER *CHILLS OR SWEATING *NAUSEA AND VOMITING THAT IS NOT CONTROLLED WITH YOUR NAUSEA MEDICATION *UNUSUAL SHORTNESS OF BREATH *UNUSUAL BRUISING OR BLEEDING *URINARY PROBLEMS (pain or burning when urinating, or frequent urination) *BOWEL PROBLEMS (unusual diarrhea, constipation, pain near the anus) TENDERNESS IN MOUTH AND THROAT WITH OR WITHOUT PRESENCE OF ULCERS (sore throat, sores in mouth, or a toothache) UNUSUAL RASH, SWELLING OR PAIN  UNUSUAL VAGINAL DISCHARGE OR ITCHING   Items with * indicate a potential emergency and should be followed up as soon as possible or go to the Emergency Department if any problems should occur.  Please show the CHEMOTHERAPY ALERT CARD or IMMUNOTHERAPY ALERT  CARD at check-in to the Emergency Department and triage nurse.  Should you have questions after your visit or need to cancel or reschedule your appointment, please contact [facility]  Dept: [phone]  and follow the prompts.  Office hours are 8:00 a.m. to 4:30 p.m. Monday - Friday. Please note that voicemails left after 4:00 p.m. may not be returned until the following business day.  We are closed weekends and major holidays. You have access to a nurse at all times for urgent questions. Please call the main number to the clinic Dept: [phone] and follow the prompts.   For any non-urgent questions, you may also contact your provider using MyChart. We now offer e-Visits for anyone 21 and older to request care online for non-urgent symptoms. For details visit [email or web address].   Also download the MyChart app! Go to the app store, search "MyChart", open the app, select [facility], and log in with your MyChart username and password.  Due to Covid, a mask is required upon entering the hospital/clinic. If you do not have a mask, one will be given to you upon arrival. For doctor visits, patients may have 1 support person aged 33 or older with them. For treatment visits, patients cannot have anyone with them due to current Covid guidelines and our immunocompromised population.

## 2021-08-25 NOTE — Progress Notes (Signed)
Pt received Keytruda infusion at 1342 and completed at 1417, followed by Alimta infusion at 1421 and completed at 1432. Around 1437 Pt complained to RN of SOB and generally not feeling well. Pt denied chest pain, dizziness, & headache.  RN placed pt on dynamap: O2 sat 100 & HR ranging from 29-89. RN ensured O2 prob was properly placed. HR continued to fluctuate. RN check radial pulse and confirmed that HR was fluctuating. At 1439 radial pulse was 84 and at 1440 radial pulse was 36. RN called Verline Lema, PA in symptom management.  At 86, [place], [place] came to evaluate pt at chairside. Pt's HR had leveled off to 100 bpm. At 1445, carboplatin infusion was stopped. At this time pt stated her SOB was like the SOB she normally has at home and she was feeling better. PA advised to hold infusion and she went to talk with pt's MD, Dr. Earlie Server.  At 1454, Burman Blacksmith came back to infusion and gave the OK to resume treatment of carboplatin.  At 1455, carboplatin infusion restarted. RN kept pt on dynamap and the infusion was completed without incident.  At 1518, carboplatin finished infusion and pt was placed on a saline flush. At this time pt informed RN that they were experiencing SOB again and she felt like her heart was racing.  RN checked dynamap and pt's HR was 168. At 1519, RN asked another RN to get her the EKG machine while [place], [place] was called. At 1520 HR was 40.  At 1523 EKG was performed and a strip was printed. Kaitlyn, PA requested patient be placed on the ZOL while she went to talk with Dr. Julien Nordmann.  At [place] RN was informed that pt would be transported to ED.  At 1536 Pt's HR leveled off to 104.  At 1538 normal saline infusion was stopped. PIV remained in Pt's RFA.  At 59 RN and PA transported Pt to the ED.

## 2021-08-25 NOTE — Consult Note (Signed)
Cardiology Consultation:   Patient ID: Kim Ross MRN: [id number]; DOB: [birth year]  Admit date: 08/25/2021 Date of Consult: 08/25/2021  PCP:  Erskine Emery, MD   [facility] Providers Cardiologist:  None        Patient Profile:   Kim Ross is a 65 y.o. female with a hx of stage IV (T4, N3, M1C) non-small cell lung cancer, adenosquamous carcinoma and hyperlipidemia who is being seen 08/25/2021 for the evaluation of atrial fibrillation at the request of Dr. Matilde Sprang.  History of Present Illness:   Kim Ross is being actively treated for her lung cancer.  She was diagnosed September 2022.  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  She is currently on palliative systemic chemotherapy with carboplatin, Alimta, and Keytruda.  Her first dose was 10/13.  She was in the chemotherapy clinic today when she went into atrial fibrillation with RVR.  She had just finished her carboplatin infusion when her heart rate increased to 168 bpm.  She was prepped and transported down to the emergency department where she spontaneously converted to sinus rhythm.  However she had another episode transiently while in the emergency department.  She was started on a heparin drip and cardiology was consulted.  She had a recent echocardiogram prior to chemotherapy 05/2021 which revealed LVEF 70 to 75% with normal diastolic function and normal RV function.  Kim Ross reports having intermittent episodes of tachycardia at home that have been going on for the last few weeks.  She does not recall having this prior to starting her chemotherapy.  Episodes last for several minutes at a time, up to about 10 or 15 minutes.  It is associated with shortness of breath and lightheadedness but no chest pain.  She has been very fatigued and her husband complains that she is not getting out of the bed moving around since she has had lung  cancer and chemotherapy.  She has been losing weight.  She denies lower extremity edema, orthopnea, or PND.  Her appetite has been poor but she is trying to drink some nutritional supplements.   Past Medical History:  Diagnosis Date   Acute pain of left shoulder 12/01/2020   Anxiety    Aspiration pneumonitis ([place]) 08/05/2019   Breast cancer screening 12/30/2017   Chest wall pain 11/05/2019   Cocaine abuse ([place])    Depression    Drug intoxication ([place]) 08/05/2019   Encounter for screening for HIV 08/05/2019   Goiter 09/05/2019   History of recurrent UTIs    No-show for appointment 09/24/2020   Pneumonia    Pre-diabetes    Urinary frequency 04/10/2013    Past Surgical History:  Procedure Laterality Date   ABDOMINAL HYSTERECTOMY     BRONCHIAL NEEDLE ASPIRATION BIOPSY  06/17/2021   Procedure: BRONCHIAL NEEDLE ASPIRATION BIOPSIES;  Surgeon: Maryjane Hurter, MD;  Location: [facility];  Service: Pulmonary;;   ENDOBRONCHIAL ULTRASOUND Left 06/17/2021   Procedure: ENDOBRONCHIAL ULTRASOUND;  Surgeon: Maryjane Hurter, MD;  Location: [facility];  Service: Pulmonary;  Laterality: Left;   HEMORRHOID SURGERY     VIDEO BRONCHOSCOPY  06/17/2021   Procedure: VIDEO BRONCHOSCOPY;  Surgeon: Maryjane Hurter, MD;  Location: [facility] END[facility];  Service: Pulmonary;;     Home Medications:  Prior to Admission medications   Medication Sig Start Date End Date Taking? Authorizing Provider  atorvastatin (LIPITOR) 40 MG tablet TAKE 1 TABLET BY MOUTH EVERY DAY  Patient taking differently: Take 40 mg by mouth daily. 02/03/21  Yes Milus Banister C, DO  feeding supplement (ENSURE ENLIVE / ENSURE PLUS) LIQD Take 237 mLs by mouth 2 (two) times daily between meals. 06/18/21  Yes Mariel Aloe, MD  fentaNYL (DURAGESIC) 50 MCG/HR Place 1 patch onto the skin every 3 (three) days. 08/04/21  Yes Heilingoetter, Cassandra L, PA-C  fluticasone (FLONASE) 50 MCG/ACT nasal spray Place 1 spray into both nostrils daily. 1  spray in each nostril every day 04/29/21  Yes Gladys Damme, MD  folic acid (FOLVITE) 1 MG tablet TAKE 1 TABLET BY MOUTH EVERY DAY Patient taking differently: Take 1 mg by mouth daily. 08/06/21  Yes Heilingoetter, Cassandra L, PA-C  HYDROcodone-acetaminophen (NORCO/VICODIN) 5-325 MG tablet Take 1 tablet by mouth every 6 (six) hours as needed for moderate pain. 08/04/21  Yes Heilingoetter, Cassandra L, PA-C  meclizine (ANTIVERT) 25 MG tablet TAKE 1 TABLET BY MOUTH TWICE A DAY 10/09/20  Yes Milus Banister C, DO  metFORMIN (GLUCOPHAGE) 500 MG tablet TAKE 1 TABLET BY MOUTH 2 TIMES DAILY WITH A MEAL. Patient taking differently: Take 250 mg by mouth daily with breakfast. 05/20/21  Yes Erskine Emery, MD  Multiple Vitamins-Minerals (MULTIVITAMIN WITH MINERALS) tablet Take 1 tablet by mouth daily. Patient taking differently: Take 1 tablet by mouth every other day. 04/27/13  Yes Olam Idler, MD  prochlorperazine (COMPAZINE) 10 MG tablet Take 1 tablet (10 mg total) by mouth every 6 (six) hours as needed. 07/08/21  Yes Heilingoetter, Cassandra L, PA-C  sertraline (ZOLOFT) 50 MG tablet TAKE 1 TABLET BY MOUTH EVERY DAY Patient taking differently: Take 50 mg by mouth daily. 11/30/20  Yes Milus Banister C, DO  triamcinolone ointment (KENALOG) 0.5 % APPLY 1 APPLICATION TOPICALLY 2 (TWO) TIMES DAILY. FOR MODERATE TO SEVERE ECZEMA. DO NOT USE FOR MORE THAN 1 WEEK AT A TIME. Patient taking differently: 1 application See admin instructions. Apply twice daily for moderate to severe eczema.  Do not use for more than 1 week at a time. 08/10/21  Yes Eppie Gibson, MD  fluconazole (DIFLUCAN) 100 MG tablet Take 2 tablets today, then 1 tablet daily x 13 more days. Hold atorvastatin while on this medication. Patient not taking: Reported on 08/25/2021 07/17/21   Eppie Gibson, MD    Inpatient Medications: Scheduled Meds:   Continuous Infusions:  heparin 950 Units/hr (08/25/21 1804)   PRN Meds:   Allergies:     Allergies  Allergen Reactions   Ibuprofen Nausea And Vomiting   Trazodone And Nefazodone     AM Dizziness & Drowsiness     Social History:   Social History   Socioeconomic History   Marital status: Single    Spouse name: Not on file   Number of children: Not on file   Years of education: Not on file   Highest education level: Not on file  Occupational History   Not on file  Tobacco Use   Smoking status: Some Days    Packs/day: 0.25    Types: Cigarettes   Smokeless tobacco: Never  Vaping Use   Vaping Use: Never used  Substance and Sexual Activity   Alcohol use: Not Currently    Alcohol/week: 1.0 standard drink    Types: 1 Cans of beer per week   Drug use: No   Sexual activity: Never  Other Topics Concern   Not on file  Social History Narrative   Not on file   Social Determinants of Health  Financial Resource Strain: High Risk   Difficulty of Paying Living Expenses: Very hard  Food Insecurity: Not on file  Transportation Needs: Not on file  Physical Activity: Not on file  Stress: Not on file  Social Connections: Not on file  Intimate Partner Violence: Not on file    Family History:    Family History  Problem Relation Age of Onset   Asthma Mother    Cancer Sister 35       Pancreatic     ROS:  Please see the history of present illness.   All other ROS reviewed and negative.     Physical Exam/Data:   Vitals:   08/25/21 1815 08/25/21 1830 08/25/21 1845 08/25/21 1900  BP: 127/77 137/70 130/89 118/82  Pulse: 82     Resp: 15 20 14 16   Temp:      TempSrc:      SpO2: 100%     Weight:      Height:       No intake or output data in the 24 hours ending 08/25/21 1912 Last 3 Weights 08/25/2021 08/25/2021 08/20/2021  Weight (lbs) 130 lb (No Data) (No Data)  Weight (kg) 58.968 kg (No Data) (No Data)     VS:  BP 118/82   Pulse 82   Temp (!) 97.5 F (36.4 C) (Oral)   Resp 16   Ht 5\' 5"  (1.651 m)   Wt 59 kg   SpO2 100%   BMI 21.63 kg/m  , BMI Body  mass index is 21.63 kg/m. GENERAL:  Frail.  Chronically ill-appearing. HEENT: Pupils equal round and reactive, fundi not visualized, oral mucosa unremarkable.  Several missing teeth NECK:  No jugular venous distention, waveform within normal limits, carotid upstroke brisk and symmetric, no bruits, no thyromegaly LUNGS:  Clear to auscultation bilaterally HEART:  RRR.  PMI not displaced or sustained,S1 and S2 within normal limits, no S3, no S4, no clicks, no rubs, no murmurs ABD:  Flat, positive bowel sounds normal in frequency in pitch, no bruits, no rebound, no guarding, no midline pulsatile mass, no hepatomegaly, no splenomegaly EXT:  2 plus pulses throughout, no edema, no cyanosis no clubbing SKIN:  No rashes no nodules NEURO:  Cranial nerves II through XII grossly intact, motor grossly intact throughout PSYCH:  Cognitively intact, oriented to person place and time   EKG:  The EKG was personally reviewed and demonstrates:  Atrial fibrillation.  Rate 181 bpm.  Telemetry:  Telemetry was personally reviewed and demonstrates:  Currently sinus rhythm.  Previously atrial fibrillation/atrial flutter with RVR.  Relevant CV Studies:  Echo 05/05/21: 1. Left ventricular ejection fraction, by estimation, is 70 to 75%. The  left ventricle has hyperdynamic function. The left ventricle has no  regional wall motion abnormalities. Left ventricular diastolic parameters  were normal.   2. Right ventricular systolic function is normal. The right ventricular  size is normal. There is mildly elevated pulmonary artery systolic  pressure.   3. The mitral valve is normal in structure. Trivial mitral valve  regurgitation.   4. The aortic valve is normal in structure. Aortic valve regurgitation is  not visualized.   Laboratory Data:  High Sensitivity Troponin:   Recent Labs  Lab 08/25/21 1620 08/25/21 1800  TROPONINIHS 8 7     Chemistry Recent Labs  Lab 08/19/21 1247 08/25/21 1149 08/25/21 1620   NA 132* 132* 131*  K 3.5 3.3* 3.5  CL 95* 96* 97*  CO2 24 24 26   GLUCOSE 125*  147* 115*  BUN 16 11 12   CREATININE 0.62 0.53 0.45  CALCIUM 8.6* 8.2* 7.9*  GFRNONAA >60 >60 >60  ANIONGAP 13 12 8     Recent Labs  Lab 08/19/21 1247 08/25/21 1149 08/25/21 1620  PROT 6.9 6.7 6.8  ALBUMIN 1.8* 1.8* 2.1*  AST 21 17 17   ALT 14 10 11   ALKPHOS 235* 301* 262*  BILITOT 0.4 0.4 0.8   Lipids No results for input(s): CHOL, TRIG, HDL, LABVLDL, LDLCALC, CHOLHDL in the last 168 hours.  Hematology Recent Labs  Lab 08/19/21 1247 08/25/21 1149 08/25/21 1620  WBC 2.3* 9.7 11.2*  RBC 2.84* 3.72* 3.57*  HGB 7.9* 10.3* 10.1*  HCT 24.0* 31.7* 31.5*  MCV 84.5 85.2 88.2  MCH 27.8 27.7 28.3  MCHC 32.9 32.5 32.1  RDW 17.9* 18.0* 18.7*  PLT 44* 208 205   Thyroid  Recent Labs  Lab 08/25/21 1149  TSH 5.540*    BNPNo results for input(s): BNP, PROBNP in the last 168 hours.  DDimer No results for input(s): DDIMER in the last 168 hours.   Radiology/Studies:  CT Angio Chest PE W and/or Wo Contrast  Result Date: 08/25/2021 CLINICAL DATA:  Please see separately dictated CT thoracolumbar spine 08/25/2021. EXAM: CT ANGIOGRAPHY CHEST WITH CONTRAST TECHNIQUE: Multidetector CT imaging of the chest was performed using the standard protocol during bolus administration of intravenous contrast. Multiplanar CT image reconstructions and MIPs were obtained to evaluate the vascular anatomy. CONTRAST:  30mL OMNIPAQUE IOHEXOL 350 MG/ML SOLN COMPARISON:  None. FINDINGS: Cardiovascular: Satisfactory opacification of the pulmonary arteries to the segmental level. No evidence of pulmonary embolism. Normal heart size. No significant pericardial effusion. The thoracic aorta is normal in caliber. Mild atherosclerotic plaque of the thoracic aorta. No coronary artery calcifications. Mediastinum/Nodes: Interval increase in size of a 5.1 x 5.8 cm (from 4.7 x 5 cm) anterior mediastinal mass extending into the aortopulmonic  window. The mass is noted to abut the aorta and encase the right main pulmonary artery. No associated right main pulmonary artery air owing. There is a more grossly stable 1.6 cm subcarinal lymph node (from approximately 1.4 cm). No axillary lymph nodes. Thyroid gland, trachea, and esophagus demonstrate no significant findings. Lungs/Pleura: Mild centrilobular emphysematous changes. No focal consolidation. Interval decrease in size of a left upper lobe 2.2 x 1.8 (from 2.9 x 2.2) pulmonary nodule. No new pulmonary nodule or mass. Interval increase in size of a moderate to large volume loculated left pleural effusion with partial collapse of the left lower lobe. No pneumothorax. Upper Abdomen: Subcentimeter hypodensities within the liver too small to characterize. Musculoskeletal: No suspicious lytic or blastic osseous lesions. No acute displaced fracture. Multilevel degenerative changes of the spine. Review of the MIP images confirms the above findings. IMPRESSION: 1. No pulmonary embolus. 2. Interval increase in size of a moderate to large volume loculated left pleural effusion with partial collapse of the left lower lobe. 3. Interval increase in size of a 5.1 x 5.8 cm (from 4.7 x 5 cm) anterior mediastinal mass extending into the aortopulmonic window. 4. Interval decrease in size of a left upper lobe 2.2 x 1.8 (from 2.9 x 2.2) pulmonary nodule consistent with known malignancy. 5. Grossly stable subcarinal lymphadenopathy. 6. Indeterminate subcentimeter hypodensities within the liver too small to characterize. 7. Aortic Atherosclerosis (ICD10-I70.0) and Emphysema (ICD10-J43.9). Electronically Signed   By: Iven Finn M.D.   On: 08/25/2021 18:18     Assessment and Plan:   # New onset atrial fibrillation: She has  spontaneously converted back to sinus rhythm.  We have started metoprolol 25 mg twice daily.  She is currently on a heparin drip.  She is receiving palliative treatment and it does not seem that  there are any plans for any surgical interventions.  If this is the case, would be reasonable to switch to Eliquis, especially given that she reports paroxysms of palpitations at home.  However, of note, her CHA2DS2-Vasc score is only 1 for female gender.  In general, would not recommend anticoagulation for this.  It is unclear how hypercoagulability from active malignancy.  It would be helpful for oncology to weigh in on anticoagulation.  She did have an echo prior to starting chemotherapy.  However given that she is receiving ongoing treatment, would repeat a limited echo to make sure there is no significant change in LVEF.  TSH was elevated, which is new.  We will repeat TSH and free T4/T3 in the morning.     Risk Assessment/Risk Scores:          CHA2DS2-VASc Score = 1   This indicates a 0.6% annual risk of stroke. The patient's score is based upon: CHF History: 0 HTN History: 0 Diabetes History: 0 Stroke History: 0 Vascular Disease History: 0 Age Score: 0 Gender Score: 1         For questions or updates, please contact [facility] Please consult [email or web address] for contact info under    Signed, Skeet Latch, MD  08/25/2021 7:12 PM

## 2021-08-25 NOTE — Progress Notes (Signed)
Nutrition Follow-up:  Patient with lung cancer metastatic to brain. She is receiving carboplatin/keytruda   Met with patient during infusion. She reports appetite has improved a little, noted patient declined appetite stimulant secondary to possible side effects. Yesterday she had a popsicle, 2 cups of applesauce, small bowl of sherbet, and one Ensure. Patient reports she thinks she could drink 2 Ensure. She is requesting additional case today.   Medications: reviewed   Labs: Na 132, K 3.3, Glucose 147  Anthropometrics: Patient declined to be weighed today per chart. Last weight 116 lb on 10/24. Given dietary recall, suspect patient weights have decreased   NUTRITION DIAGNOSIS: Inadequate oral intake continues    MALNUTRITION DIAGNOSIS: Severe malnutrition continues    INTERVENTION:  Encouraged small frequent meals and snacks q2 hours Encouraged high calorie high protein foods - pt has handout with snack ideas  Patient agrees to increase intake of Ensure Enlive 2x/day Complimentary case of Ensure Enlive provided today  Contact information provided     MONITORING, EVALUATION, GOAL: weight trends, intake    NEXT VISIT: Wednesday December 15 during infusion     

## 2021-08-25 NOTE — Progress Notes (Addendum)
Pharmacy Antibiotic Note  Kim Ross is a 65 y.o. female admitted on 08/25/2021 with pneumonia.  Pharmacy has been consulted for cefepime and vancomycin dosing.  Plan: Vancomycin 1000 mg IV Q 24 hrs. Goal AUC 400-550. Expected AUC: 506 SCr used: 0.8  Cefepime 2 g iv q 8 hours  Will f/u renal function, culture results, and clinical course Levels if/when indicated    Height: 5\' 5"  (165.1 cm) Weight: 59 kg (130 lb) IBW/kg (Calculated) : 57  Temp (24hrs), Avg:97.1 F (36.2 C), Min:96.1 F (35.6 C), Max:97.8 F (36.6 C)  Recent Labs  Lab 08/19/21 1247 08/25/21 1149 08/25/21 1620  WBC 2.3* 9.7 11.2*  CREATININE 0.62 0.53 0.45    Estimated Creatinine Clearance: 64.8 mL/min (by C-G formula based on SCr of 0.45 mg/dL).    Allergies  Allergen Reactions   Ibuprofen Nausea And Vomiting   Trazodone And Nefazodone     AM Dizziness & Drowsiness     Antimicrobials this admission: 11/22 cefepime >>  11/22 vancomycin >>   Dose adjustments this admission: 11/22 MRSA PCR:   Thank you for allowing pharmacy to be a part of this patient's care.  Napoleon Form 08/25/2021 8:44 PM

## 2021-08-25 NOTE — Progress Notes (Signed)
APTT level is 189 sec. Notified the pharmacist and per her statement, the level was drawn earlier than expected so no changes made. Olena Heckle NP notified  as well.  No bleeding/bruising/hematoma noted.

## 2021-08-25 NOTE — ED Triage Notes (Signed)
Pt brought from cancer center via wheelchair for new onset Afib w/ RVR.

## 2021-08-25 NOTE — Telephone Encounter (Signed)
Spoke with Mr. Thompson regarding the class scheduled for 0900 today.  He stated that they just got up so they will not make appointment.  Rescheduled the patient to 09-02-21 at 1100 as she has labs and Dr. Isidore Moos appointment at 1130 am.   This is the third and final time that the class will be rescheduled. Dr. Kem Boroughs Diane aware of above.

## 2021-08-25 NOTE — Progress Notes (Signed)
ANTICOAGULATION CONSULT NOTE  Pharmacy Consult for Heparin  Indication: atrial fibrillation  Allergies  Allergen Reactions   Ibuprofen Nausea And Vomiting   Trazodone And Nefazodone     AM Dizziness & Drowsiness     Patient Measurements: Height: 5\' 5"  (165.1 cm) Weight: 59 kg (130 lb) IBW/kg (Calculated) : 57 Heparin Dosing Weight: 59 kg  Vital Signs: Temp: 97.5 F (36.4 C) (11/22 1645) Temp Source: Oral (11/22 1645) BP: 113/67 (11/22 1645) Pulse Rate: 104 (11/22 1536)  Labs: Recent Labs    08/25/21 1149  HGB 10.3*  HCT 31.7*  PLT 208  CREATININE 0.53    Estimated Creatinine Clearance: 64.8 mL/min (by C-G formula based on SCr of 0.53 mg/dL).   Medical History: Past Medical History:  Diagnosis Date   Acute pain of left shoulder 12/01/2020   Anxiety    Aspiration pneumonitis ([place]) 08/05/2019   Breast cancer screening 12/30/2017   Chest wall pain 11/05/2019   Cocaine abuse ([place])    Depression    Drug intoxication ([place]) 08/05/2019   Encounter for screening for HIV 08/05/2019   Goiter 09/05/2019   History of recurrent UTIs    No-show for appointment 09/24/2020   Pneumonia    Pre-diabetes    Urinary frequency 04/10/2013    Assessment: 65 y/o F with sent to ED from oncology infusion center for for atrial fibrillation with rapid ventricular response. Pharmacy consulted to manage heparin infusion. Patient was not on blood thinners PTA.   Goal of Therapy:  Heparin level 0.3-0.7 units/ml Monitor platelets by anticoagulation protocol: Yes   Plan:  Give 3000 units bolus x 1 Start heparin infusion at 950 units/hr Check anti-Xa level in 6 hours and daily while on heparin Continue to monitor H&H and platelets  Ulice Dash D 08/25/2021,5:08 PM

## 2021-08-25 NOTE — Progress Notes (Signed)
[facility] [phone]   [phone]  OFFICE PROGRESS NOTE  Erskine Emery, MD [place] [address]  DIAGNOSIS: Stage IV (T4, N3, M1C) non-small cell lung cancer, adenosquamous carcinoma.  She initially presented with a large cavitary left lower lobe lung mass, cavitary mass in the mediastinum, left upper lobe nodule, and a left pleuritic mass with left pleural effusion.  She also has hypermetabolic adenopathy in the chest, right cervical lymph node, and in the right upper inguinal lymph node.  She has a lytic metastatic lesion on C7/T1 with soft tissue extension.  Pending CT of the head. She was diagnosed in September 2022.    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 Isidore Moos on 07/27/21 2) Palliative radiation to the large primary lung mass under the care of Dr. Sondra Come. Last dose on 08/03/21.    CURRENT THERAPY: Palliative systemic chemotherapy with carboplatin for an AUC of 5, Alimta 500 mg per metered squared, and Keytruda 200 mg IV every 3 weeks.  First dose expected on 07/16/2021.  Status post 2 cycles.  INTERVAL HISTORY: Kim Ross 65 y.o. female returns to the clinic today for follow-up visit accompanied by her husband.  The patient is feeling much better compared to several weeks ago.  She continues to have fatigue and weakness in her lower extremities.  She denied having any current chest pain, shortness of breath, cough or hemoptysis.  She has no nausea, vomiting, diarrhea or  constipation.  She has no headache or visual changes.  She is here today for evaluation before starting cycle #3 of her treatment.  MEDICAL HISTORY: Past Medical History:  Diagnosis Date   Acute pain of left shoulder 12/01/2020   Anxiety    Aspiration pneumonitis ([place]) 08/05/2019   Breast cancer screening 12/30/2017   Chest wall pain 11/05/2019   Cocaine abuse ([place])    Depression    Drug intoxication ([place]) 08/05/2019   Encounter for screening for HIV 08/05/2019   Goiter 09/05/2019   History of recurrent UTIs    No-show for appointment 09/24/2020   Pneumonia    Pre-diabetes    Urinary frequency 04/10/2013    ALLERGIES:  is allergic to ibuprofen and trazodone and nefazodone.  MEDICATIONS:  Current Outpatient Medications  Medication Sig Dispense Refill   atorvastatin (LIPITOR) 40 MG tablet TAKE 1 TABLET BY MOUTH EVERY DAY 90 tablet 3   feeding supplement (ENSURE ENLIVE / ENSURE PLUS) LIQD Take 237 mLs by mouth 2 (two) times daily between meals.     fentaNYL (DURAGESIC) 50 MCG/HR Place 1 patch onto the skin every 3 (three) days. 5 patch 0   fluconazole (DIFLUCAN) 100 MG tablet Take 2 tablets today, then 1 tablet daily x 13 more days. Hold atorvastatin while on this medication. 15 tablet 0   fluticasone (FLONASE) 50 MCG/ACT nasal spray Place 1 spray into both nostrils daily. 1 spray in each nostril every  day 16 g 12   folic acid (FOLVITE) 1 MG tablet TAKE 1 TABLET BY MOUTH EVERY DAY 90 tablet 2   HYDROcodone-acetaminophen (NORCO/VICODIN) 5-325 MG tablet Take 1 tablet by mouth every 6 (six) hours as needed for moderate pain. 40 tablet 0   meclizine (ANTIVERT) 25 MG tablet TAKE 1 TABLET BY MOUTH TWICE A DAY 180 tablet 3   metFORMIN (GLUCOPHAGE) 500 MG tablet TAKE 1 TABLET BY MOUTH 2 TIMES DAILY WITH A MEAL. (Patient taking differently: Take 250 mg by mouth daily with breakfast.) 180 tablet 0   Multiple Vitamins-Minerals (MULTIVITAMIN WITH MINERALS) tablet Take 1 tablet by mouth daily.  (Patient taking differently: Take 1 tablet by mouth every other day.) 1 tablet 11   prochlorperazine (COMPAZINE) 10 MG tablet Take 1 tablet (10 mg total) by mouth every 6 (six) hours as needed. 30 tablet 2   sertraline (ZOLOFT) 50 MG tablet TAKE 1 TABLET BY MOUTH EVERY DAY 90 tablet 3   triamcinolone ointment (KENALOG) 0.5 % APPLY 1 APPLICATION TOPICALLY 2 (TWO) TIMES DAILY. FOR MODERATE TO SEVERE ECZEMA. DO NOT USE FOR MORE THAN 1 WEEK AT A TIME. 60 g 3   No current facility-administered medications for this visit.    SURGICAL HISTORY:  Past Surgical History:  Procedure Laterality Date   ABDOMINAL HYSTERECTOMY     BRONCHIAL NEEDLE ASPIRATION BIOPSY  06/17/2021   Procedure: BRONCHIAL NEEDLE ASPIRATION BIOPSIES;  Surgeon: Maryjane Hurter, MD;  Location: [facility];  Service: Pulmonary;;   ENDOBRONCHIAL ULTRASOUND Left 06/17/2021   Procedure: ENDOBRONCHIAL ULTRASOUND;  Surgeon: Maryjane Hurter, MD;  Location: [facility];  Service: Pulmonary;  Laterality: Left;   HEMORRHOID SURGERY     VIDEO BRONCHOSCOPY  06/17/2021   Procedure: VIDEO BRONCHOSCOPY;  Surgeon: Maryjane Hurter, MD;  Location: [facility];  Service: Pulmonary;;    REVIEW OF SYSTEMS:  A comprehensive review of systems was negative except for: Constitutional: positive for anorexia and fatigue Musculoskeletal: positive for muscle weakness   PHYSICAL EXAMINATION: General appearance: alert, cooperative, fatigued, and no distress Head: Normocephalic, without obvious abnormality, atraumatic Neck: no adenopathy, no JVD, supple, symmetrical, trachea midline, and thyroid not enlarged, symmetric, no tenderness/mass/nodules Lymph nodes: Cervical, supraclavicular, and axillary nodes normal. Resp: clear to auscultation bilaterally Back: symmetric, no curvature. ROM normal. No CVA tenderness. Cardio: regular rate and rhythm, S1, S2 normal, no murmur, click, rub or gallop GI: soft, non-tender; bowel sounds normal; no masses,  no  organomegaly Extremities: extremities normal, atraumatic, no cyanosis or edema  ECOG PERFORMANCE STATUS: 1 - Symptomatic but completely ambulatory  Blood pressure 132/85, pulse (!) 111, temperature (!) 96.1 F (35.6 C), temperature source Oral, height 5\' 5"  (1.651 m), SpO2 100 %.  LABORATORY DATA: Lab Results  Component Value Date   WBC 9.7 08/25/2021   HGB 10.3 (L) 08/25/2021   HCT 31.7 (L) 08/25/2021   MCV 85.2 08/25/2021   PLT 208 08/25/2021      Chemistry      Component Value Date/Time   NA 132 (L) 08/19/2021 1247   NA 139 04/29/2021 0931   K 3.5 08/19/2021 1247   CL 95 (L) 08/19/2021 1247   CO2 24 08/19/2021 1247   BUN 16 08/19/2021 1247   BUN 13 04/29/2021 0931   CREATININE 0.62 08/19/2021 1247   CREATININE 0.81 09/30/2016 1651      Component Value Date/Time   CALCIUM 8.6 (L) 08/19/2021 1247   ALKPHOS 235 (H) 08/19/2021 1247   AST 21 08/19/2021 1247  ALT 14 08/19/2021 1247   BILITOT 0.4 08/19/2021 1247       RADIOGRAPHIC STUDIES: No results found.  ASSESSMENT AND PLAN: This is a very pleasant 65 years old African-American female diagnosed with a stage IV (T4, N3, M1 C) non-small cell lung cancer, adenosquamous carcinoma initially diagnosed with large cavitary left lower lobe lung mass, cavitary mass in the mediastinum and left upper lobe nodule in addition to left pleuritic mass with left pleural effusion and hypermetabolic adenopathy in the chest, right cervical nodes as well as right upper inguinal lymph nodes and lytic bone lesion diagnosed in September 2022. The patient is currently undergoing systemic chemotherapy with carboplatin for AUC of 5, Alimta 500 Mg/M2 and Keytruda 200 Mg IV every 3 weeks status post 2 cycles.  She has been tolerating her treatment fairly well with no concerning adverse effect except for the fatigue and chemotherapy-induced anemia.  She received 2 units of PRBCs transfusion recently and she is feeling much better. I recommended for  the patient to proceed with cycle #3 today as planned. I will see her back for follow-up visit in 3 weeks for evaluation with repeat CT scan of the chest, abdomen and pelvis for restaging of her disease. The patient was advised to call immediately if she has any other concerning symptoms in the interval. The patient voices understanding of current disease status and treatment options and is in agreement with the current care plan.  All questions were answered. The patient knows to call the clinic with any problems, questions or concerns. We can certainly see the patient much sooner if necessary.  The total time spent in the appointment was 20 minutes.  Disclaimer: This note was dictated with voice recognition software. Similar sounding words can inadvertently be transcribed and may not be corrected upon review.

## 2021-08-25 NOTE — ED Provider Notes (Signed)
[facility] DEPT Provider Note   CSN: [id number] Arrival date & time: 08/25/21  1548     History Chief Complaint  Patient presents with   Tachycardia    Kim Ross is a 65 y.o. 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 fib with RVR.  Patient states that for the last 2 months she has had persistent shortness of breath with exertion and today is not different than usual but it does prevent her from getting out of bed and moving around.  While in the infusion clinic today she was noticed to have A. fib with RVR which is new for this patient.  She is not anticoagulated at this time.  She currently denies chest pain, abdominal pain, nausea, vomiting or other systemic symptoms.  HPI     Past Medical History:  Diagnosis Date   Acute pain of left shoulder 12/01/2020   Anxiety    Aspiration pneumonitis ([place]) 08/05/2019   Breast cancer screening 12/30/2017   Chest wall pain 11/05/2019   Cocaine abuse ([place])    Depression    Drug intoxication ([place]) 08/05/2019   Encounter for screening for HIV 08/05/2019   Goiter 09/05/2019   History of recurrent UTIs    No-show for appointment 09/24/2020   Pneumonia    Pre-diabetes    Urinary frequency 04/10/2013    Patient Active Problem List   Diagnosis Date Noted   Atrial fibrillation with RVR ([place]) 08/25/2021   Brain metastasis ([place]) 07/15/2021   Adenocarcinoma of left lung, stage 4 ([place]) 07/08/2021   Weight loss 07/08/2021   Lung cancer ([place]) 06/24/2021   Cancer related pain 06/24/2021   Malnutrition of moderate degree 06/16/2021   Tobacco abuse 06/15/2021   Lobar pneumonia ([place]) 06/15/2021   SIRS (systemic inflammatory response syndrome) ([place]) 06/14/2021   Diabetes ([place]) 06/14/2021   Dyspnea 05/03/2021   Lung  mass 05/03/2021   Fatigue 03/26/2021   Hyperkeratosis of nail 03/11/2021   Prediabetes 02/05/2021   Stress at home 02/05/2021   Psychophysiological insomnia 02/05/2021   Dry skin 11/17/2020   Onychomycosis of left great toe 09/05/2019   Seasonal allergies 12/30/2017   Hyperlipidemia 12/30/2017   UTI (urinary tract infection) 02/09/2017   Hematuria 04/01/2015   History of recurrent UTIs 01/31/2014   Vertigo, intermittent 06/27/2013   Generalized anxiety disorder 04/10/2013   Irritable bowel syndrome 10/19/2012   Tobacco use disorder 07/20/2012    Past Surgical History:  Procedure Laterality Date   ABDOMINAL HYSTERECTOMY     BRONCHIAL NEEDLE ASPIRATION BIOPSY  06/17/2021   Procedure: BRONCHIAL NEEDLE ASPIRATION BIOPSIES;  Surgeon: Maryjane Hurter, MD;  Location: [facility];  Service: Pulmonary;;   ENDOBRONCHIAL ULTRASOUND Left 06/17/2021   Procedure: ENDOBRONCHIAL ULTRASOUND;  Surgeon: Maryjane Hurter, MD;  Location: [facility];  Service: Pulmonary;  Laterality: Left;   HEMORRHOID SURGERY     VIDEO BRONCHOSCOPY  06/17/2021   Procedure: VIDEO BRONCHOSCOPY;  Surgeon: Maryjane Hurter, MD;  Location: [facility];  Service: Pulmonary;;     OB History     Gravida  1   Para      Term      Preterm      AB      Living  1      SAB      IAB  Ectopic      Multiple      Live Births  1           Family History  Problem Relation Age of Onset   Asthma Mother    Cancer Sister 57       Pancreatic    Social History   Tobacco Use   Smoking status: Some Days    Packs/day: 0.25    Types: Cigarettes   Smokeless tobacco: Never  Vaping Use   Vaping Use: Never used  Substance Use Topics   Alcohol use: Not Currently    Alcohol/week: 1.0 standard drink    Types: 1 Cans of beer per week   Drug use: No    Home Medications Prior to Admission medications   Medication Sig Start Date End Date Taking? Authorizing Provider  atorvastatin (LIPITOR) 40 MG  tablet TAKE 1 TABLET BY MOUTH EVERY DAY Patient taking differently: Take 40 mg by mouth daily. 02/03/21  Yes Milus Banister C, DO  feeding supplement (ENSURE ENLIVE / ENSURE PLUS) LIQD Take 237 mLs by mouth 2 (two) times daily between meals. 06/18/21  Yes Mariel Aloe, MD  fentaNYL (DURAGESIC) 50 MCG/HR Place 1 patch onto the skin every 3 (three) days. 08/04/21  Yes Heilingoetter, Cassandra L, PA-C  fluticasone (FLONASE) 50 MCG/ACT nasal spray Place 1 spray into both nostrils daily. 1 spray in each nostril every day 04/29/21  Yes Gladys Damme, MD  folic acid (FOLVITE) 1 MG tablet TAKE 1 TABLET BY MOUTH EVERY DAY Patient taking differently: Take 1 mg by mouth daily. 08/06/21  Yes Heilingoetter, Cassandra L, PA-C  HYDROcodone-acetaminophen (NORCO/VICODIN) 5-325 MG tablet Take 1 tablet by mouth every 6 (six) hours as needed for moderate pain. 08/04/21  Yes Heilingoetter, Cassandra L, PA-C  meclizine (ANTIVERT) 25 MG tablet TAKE 1 TABLET BY MOUTH TWICE A DAY 10/09/20  Yes Milus Banister C, DO  metFORMIN (GLUCOPHAGE) 500 MG tablet TAKE 1 TABLET BY MOUTH 2 TIMES DAILY WITH A MEAL. Patient taking differently: Take 250 mg by mouth daily with breakfast. 05/20/21  Yes Erskine Emery, MD  Multiple Vitamins-Minerals (MULTIVITAMIN WITH MINERALS) tablet Take 1 tablet by mouth daily. Patient taking differently: Take 1 tablet by mouth every other day. 04/27/13  Yes Olam Idler, MD  prochlorperazine (COMPAZINE) 10 MG tablet Take 1 tablet (10 mg total) by mouth every 6 (six) hours as needed. 07/08/21  Yes Heilingoetter, Cassandra L, PA-C  sertraline (ZOLOFT) 50 MG tablet TAKE 1 TABLET BY MOUTH EVERY DAY Patient taking differently: Take 50 mg by mouth daily. 11/30/20  Yes Milus Banister C, DO  triamcinolone ointment (KENALOG) 0.5 % APPLY 1 APPLICATION TOPICALLY 2 (TWO) TIMES DAILY. FOR MODERATE TO SEVERE ECZEMA. DO NOT USE FOR MORE THAN 1 WEEK AT A TIME. Patient taking differently: 1 application See admin  instructions. Apply twice daily for moderate to severe eczema.  Do not use for more than 1 week at a time. 08/10/21  Yes Eppie Gibson, MD  fluconazole (DIFLUCAN) 100 MG tablet Take 2 tablets today, then 1 tablet daily x 13 more days. Hold atorvastatin while on this medication. Patient not taking: Reported on 08/25/2021 07/17/21   Eppie Gibson, MD    Allergies    Ibuprofen and Trazodone and nefazodone  Review of Systems   Review of Systems  Constitutional:  Negative for chills and fever.  HENT:  Negative for ear pain and sore throat.   Eyes:  Negative for pain and visual disturbance.  Respiratory:  Positive for shortness of breath. Negative for cough.   Cardiovascular:  Positive for palpitations. Negative for chest pain.  Gastrointestinal:  Negative for abdominal pain and vomiting.  Genitourinary:  Negative for dysuria and hematuria.  Musculoskeletal:  Negative for arthralgias and back pain.  Skin:  Negative for color change and rash.  Neurological:  Negative for seizures and syncope.  All other systems reviewed and are negative.  Physical Exam Updated Vital Signs BP 127/77   Pulse 82   Temp (!) 97.5 F (36.4 C) (Oral)   Resp 15   Ht 5\' 5"  (1.651 m)   Wt 59 kg   SpO2 100%   BMI 21.63 kg/m   Physical Exam Vitals and nursing note reviewed.  Constitutional:      General: She is not in acute distress.    Appearance: She is well-developed.  HENT:     Head: Normocephalic and atraumatic.  Eyes:     Conjunctiva/sclera: Conjunctivae normal.  Cardiovascular:     Rate and Rhythm: Tachycardia present. Rhythm irregular.     Heart sounds: No murmur heard. Pulmonary:     Effort: Pulmonary effort is normal. No respiratory distress.     Comments: Decreased left-sided breath sounds Abdominal:     Palpations: Abdomen is soft.     Tenderness: There is no abdominal tenderness.  Musculoskeletal:        General: No swelling.     Cervical back: Neck supple.  Skin:    General: Skin  is warm and dry.     Capillary Refill: Capillary refill takes less than 2 seconds.  Neurological:     Mental Status: She is alert.  Psychiatric:        Mood and Affect: Mood normal.    ED Results / Procedures / Treatments   Labs (all labs ordered are listed, but only abnormal results are displayed) Labs Reviewed  CBC WITH DIFFERENTIAL/PLATELET - Abnormal; Notable for the following components:      Result Value   WBC 11.2 (*)    RBC 3.57 (*)    Hemoglobin 10.1 (*)    HCT 31.5 (*)    RDW 18.7 (*)    Neutro Abs 10.3 (*)    Lymphs Abs 0.5 (*)    Abs Immature Granulocytes 0.10 (*)    All other components within normal limits  COMPREHENSIVE METABOLIC PANEL - Abnormal; Notable for the following components:   Sodium 131 (*)    Chloride 97 (*)    Glucose, Bld 115 (*)    Calcium 7.9 (*)    Albumin 2.1 (*)    Alkaline Phosphatase 262 (*)    All other components within normal limits  RESP PANEL BY RT-PCR (FLU A&B, COVID) ARPGX2  APTT  PROTIME-INR  TROPONIN I (HIGH SENSITIVITY)  TROPONIN I (HIGH SENSITIVITY)    EKG EKG Interpretation  Date/Time:  Tuesday August 25 2021 16:06:36 EST Ventricular Rate:  104 PR Interval:  108 QRS Duration: 132 QT Interval:  370 QTC Calculation: 487 R Axis:   71 Text Interpretation: Sinus tachycardia Confirmed by [place]land West ([id number]) on 08/25/2021 4:38:19 PM  Radiology CT Angio Chest PE W and/or Wo Contrast  Result Date: 08/25/2021 CLINICAL DATA:  Please see separately dictated CT thoracolumbar spine 08/25/2021. EXAM: CT ANGIOGRAPHY CHEST WITH CONTRAST TECHNIQUE: Multidetector CT imaging of the chest was performed using the standard protocol during bolus administration of intravenous contrast. Multiplanar CT image reconstructions and MIPs were obtained to evaluate the vascular anatomy. CONTRAST:  4mL OMNIPAQUE IOHEXOL  350 MG/ML SOLN COMPARISON:  None. FINDINGS: Cardiovascular: Satisfactory opacification of the pulmonary arteries to the  segmental level. No evidence of pulmonary embolism. Normal heart size. No significant pericardial effusion. The thoracic aorta is normal in caliber. Mild atherosclerotic plaque of the thoracic aorta. No coronary artery calcifications. Mediastinum/Nodes: Interval increase in size of a 5.1 x 5.8 cm (from 4.7 x 5 cm) anterior mediastinal mass extending into the aortopulmonic window. The mass is noted to abut the aorta and encase the right main pulmonary artery. No associated right main pulmonary artery air owing. There is a more grossly stable 1.6 cm subcarinal lymph node (from approximately 1.4 cm). No axillary lymph nodes. Thyroid gland, trachea, and esophagus demonstrate no significant findings. Lungs/Pleura: Mild centrilobular emphysematous changes. No focal consolidation. Interval decrease in size of a left upper lobe 2.2 x 1.8 (from 2.9 x 2.2) pulmonary nodule. No new pulmonary nodule or mass. Interval increase in size of a moderate to large volume loculated left pleural effusion with partial collapse of the left lower lobe. No pneumothorax. Upper Abdomen: Subcentimeter hypodensities within the liver too small to characterize. Musculoskeletal: No suspicious lytic or blastic osseous lesions. No acute displaced fracture. Multilevel degenerative changes of the spine. Review of the MIP images confirms the above findings. IMPRESSION: 1. No pulmonary embolus. 2. Interval increase in size of a moderate to large volume loculated left pleural effusion with partial collapse of the left lower lobe. 3. Interval increase in size of a 5.1 x 5.8 cm (from 4.7 x 5 cm) anterior mediastinal mass extending into the aortopulmonic window. 4. Interval decrease in size of a left upper lobe 2.2 x 1.8 (from 2.9 x 2.2) pulmonary nodule consistent with known malignancy. 5. Grossly stable subcarinal lymphadenopathy. 6. Indeterminate subcentimeter hypodensities within the liver too small to characterize. 7. Aortic Atherosclerosis  (ICD10-I70.0) and Emphysema (ICD10-J43.9). Electronically Signed   By: Iven Finn M.D.   On: 08/25/2021 18:18    Procedures .Critical Care Performed by: Teressa Lower, MD Authorized by: Teressa Lower, MD   Critical care provider statement:    Critical care time (minutes):  30   Critical care was necessary to treat or prevent imminent or life-threatening deterioration of the following conditions:  Cardiac failure (AFib RVR)   Critical care was time spent personally by me on the following activities:  Development of treatment plan with patient or surrogate, discussions with consultants, evaluation of patient's response to treatment, examination of patient, ordering and review of laboratory studies, ordering and review of radiographic studies, ordering and performing treatments and interventions, pulse oximetry, re-evaluation of patient's condition and review of old charts   Medications Ordered in ED Medications  heparin ADULT infusion 100 units/mL (25000 units/254mL) (950 Units/hr Intravenous New Bag/Given 08/25/21 1804)  lactated ringers bolus 1,000 mL (1,000 mLs Intravenous New Bag/Given 08/25/21 1657)  metoprolol tartrate (LOPRESSOR) injection 5 mg (5 mg Intravenous Given 08/25/21 1645)  iohexol (OMNIPAQUE) 350 MG/ML injection 75 mL (75 mLs Intravenous Contrast Given 08/25/21 1735)  heparin bolus via infusion 3,000 Units (3,000 Units Intravenous Bolus from Bag 08/25/21 1800)    ED Course  I have reviewed the triage vital signs and the nursing notes.  Pertinent labs & imaging results that were available during my care of the patient were reviewed by me and considered in my medical decision making (see chart for details).    MDM Rules/Calculators/A&P  Patient seen the emergency department for evaluation of new onset A. fib with RVR and shortness of breath.  Physical exam reveals decreased breath sounds at the bases on the left as well as an irregular  tachycardia.  Patient initially arrived in sinus tachycardia but had frequent bursts of A. fib with RVR with rates as high as 180.  Laboratory evaluation with a mild leukocytosis to 11.2, hemoglobin 10.1, hyponatremia 131 but is otherwise unremarkable.  She was given 5 mg Lopressor as well as a liter of fluids and she did not have any additional runs of A. fib with RVR.  I spoke with cardiology (dr. Oval Linsey) who will round on the patient tomorrow morning.  She agrees the patient currently does not need to be on a dilt drip and can likely be managed with oral Toprol.  Patient started on heparin drip and CT be obtained in the setting of her shortness of breath that shows an interval increase of the patient's anterior mediastinal mass as well as an interval increase in the size of a moderate to large volume loculated left-sided pleural effusion.  Patient is not hypoxic and is 100% on room air.  Patient will require admission for rate control and likely need for thoracentesis in the setting of her persistent shortness of breath.  Final Clinical Impression(s) / ED Diagnoses Final diagnoses:  None    Rx / DC Orders ED Discharge Orders     None        Huldah [name], Debe Coder, MD 08/25/21 [phone]

## 2021-08-25 NOTE — ED Notes (Signed)
ED TO INPATIENT HANDOFF REPORT  ED Nurse Name and Phone #:   S Name/Age/Gender Kim Ross 65 y.o. female Room/Bed: WA02/WA02  Code Status   Code Status: Prior  Home/SNF/Other Home Patient oriented to: self, place, time, and situation Is this baseline? Yes   Triage Complete: Triage complete  Chief Complaint Atrial fibrillation with RVR ([place]) [I48.91]  Triage Note Pt brought from cancer center via wheelchair for new onset Afib w/ RVR.     Allergies Allergies  Allergen Reactions   Ibuprofen Nausea And Vomiting   Trazodone And Nefazodone     AM Dizziness & Drowsiness     Level of Care/Admitting Diagnosis ED Disposition     ED Disposition  Admit   Condition  --   Comment  Hospital Area: [place] [[id number]]  Level of Care: Progressive [102]  Admit to Progressive based on following criteria: MULTISYSTEM THREATS such as stable sepsis, metabolic/electrolyte imbalance with or without encephalopathy that is responding to early treatment.  May place patient in observation at [facility] or [place] if equivalent level of care is available:: No  Covid Evaluation: Asymptomatic Screening Protocol (No Symptoms)  Diagnosis: Atrial fibrillation with RVR [facility]) [[id number]]  Admitting Physician: Rise Patience [phone]  Attending Physician: Beryle Flock          B Medical/Surgery History Past Medical History:  Diagnosis Date   Acute pain of left shoulder 12/01/2020   Anxiety    Aspiration pneumonitis ([place]) 08/05/2019   Breast cancer screening 12/30/2017   Chest wall pain 11/05/2019   Cocaine abuse ([place])    Depression    Drug intoxication ([place]) 08/05/2019   Encounter for screening for HIV 08/05/2019   Goiter 09/05/2019   History of recurrent UTIs    No-show for appointment 09/24/2020   Pneumonia    Pre-diabetes    Urinary frequency 04/10/2013   Past Surgical History:  Procedure Laterality Date   ABDOMINAL HYSTERECTOMY      BRONCHIAL NEEDLE ASPIRATION BIOPSY  06/17/2021   Procedure: BRONCHIAL NEEDLE ASPIRATION BIOPSIES;  Surgeon: Maryjane Hurter, MD;  Location: [facility];  Service: Pulmonary;;   ENDOBRONCHIAL ULTRASOUND Left 06/17/2021   Procedure: ENDOBRONCHIAL ULTRASOUND;  Surgeon: Maryjane Hurter, MD;  Location: [facility];  Service: Pulmonary;  Laterality: Left;   HEMORRHOID SURGERY     VIDEO BRONCHOSCOPY  06/17/2021   Procedure: VIDEO BRONCHOSCOPY;  Surgeon: Maryjane Hurter, MD;  Location: [facility];  Service: Pulmonary;;     A IV Location/Drains/Wounds Patient Lines/Drains/Airways Status     Active Line/Drains/Airways     Name Placement date Placement time Site Days   Peripheral IV 08/25/21 24 G Anterior;Distal;Right Forearm 08/25/21  1249  Forearm  less than 1   Peripheral IV 08/25/21 20 G 2.5" Anterior;Distal;Left;Upper Arm 08/25/21  1700  Arm  less than 1            Intake/Output Last 24 hours No intake or output data in the 24 hours ending 08/25/21 1917  Labs/Imaging Results for orders placed or performed during the hospital encounter of 08/25/21 (from the past 48 hour(s))  CBC with Differential     Status: Abnormal   Collection Time: 08/25/21  4:20 PM  Result Value Ref Range   WBC 11.2 (H) 4.0 - 10.5 K/uL   RBC 3.57 (L) 3.87 - 5.11 MIL/uL   Hemoglobin 10.1 (L) 12.0 - 15.0 g/dL   HCT 31.5 (L) 36.0 - 46.0 %   MCV 88.2 80.0 -  100.0 fL   MCH 28.3 26.0 - 34.0 pg   MCHC 32.1 30.0 - 36.0 g/dL   RDW 18.7 (H) 11.5 - 15.5 %   Platelets 205 150 - 400 K/uL   nRBC 0.0 0.0 - 0.2 %   Neutrophils Relative % 92 %   Neutro Abs 10.3 (H) 1.7 - 7.7 K/uL   Lymphocytes Relative 4 %   Lymphs Abs 0.5 (L) 0.7 - 4.0 K/uL   Monocytes Relative 3 %   Monocytes Absolute 0.3 0.1 - 1.0 K/uL   Eosinophils Relative 0 %   Eosinophils Absolute 0.0 0.0 - 0.5 K/uL   Basophils Relative 0 %   Basophils Absolute 0.0 0.0 - 0.1 K/uL   Immature Granulocytes 1 %   Abs Immature Granulocytes 0.10 (H)  0.00 - 0.07 K/uL    Comment: Performed at [facility], [place] [address] [place], [place] [address]  Troponin I (High Sensitivity)     Status: None   Collection Time: 08/25/21  4:20 PM  Result Value Ref Range   Troponin I (High Sensitivity) 8 <18 ng/L    Comment: (NOTE) Elevated high sensitivity troponin I (hsTnI) values and significant  changes across serial measurements may suggest ACS but many other  chronic and acute conditions are known to elevate hsTnI results.  Refer to the "Links" section for chest pain algorithms and additional  guidance. Performed at [facility], [place] [address] [place], [place] [address]   Comprehensive metabolic panel     Status: Abnormal   Collection Time: 08/25/21  4:20 PM  Result Value Ref Range   Sodium 131 (L) 135 - 145 mmol/L   Potassium 3.5 3.5 - 5.1 mmol/L   Chloride 97 (L) 98 - 111 mmol/L   CO2 26 22 - 32 mmol/L   Glucose, Bld 115 (H) 70 - 99 mg/dL    Comment: Glucose reference range applies only to samples taken after fasting for at least 8 hours.   BUN 12 8 - 23 mg/dL   Creatinine, Ser 0.45 0.44 - 1.00 mg/dL   Calcium 7.9 (L) 8.9 - 10.3 mg/dL   Total Protein 6.8 6.5 - 8.1 g/dL   Albumin 2.1 (L) 3.5 - 5.0 g/dL   AST 17 15 - 41 U/L   ALT 11 0 - 44 U/L   Alkaline Phosphatase 262 (H) 38 - 126 U/L   Total Bilirubin 0.8 0.3 - 1.2 mg/dL   GFR, Estimated >60 >60 mL/min    Comment: (NOTE) Calculated using the CKD-EPI Creatinine Equation (2021)    Anion gap 8 5 - 15    Comment: Performed at [facility], [place] [address], [place], [place] [address]  Troponin I (High Sensitivity)     Status: None   Collection Time: 08/25/21  6:00 PM  Result Value Ref Range   Troponin I (High Sensitivity) 7 <18 ng/L    Comment: (NOTE) Elevated high sensitivity troponin I (hsTnI) values and significant  changes across serial measurements may suggest ACS but many other  chronic and acute conditions are known  to elevate hsTnI results.  Refer to the "Links" section for chest pain algorithms and additional  guidance. Performed at [facility], [place] [address], [place], [place] [address]    CT Angio Chest PE W and/or Wo Contrast  Result Date: 08/25/2021 CLINICAL DATA:  Please see separately dictated CT thoracolumbar spine 08/25/2021. EXAM: CT ANGIOGRAPHY CHEST WITH CONTRAST TECHNIQUE: Multidetector CT imaging of the chest was  performed using the standard protocol during bolus administration of intravenous contrast. Multiplanar CT image reconstructions and MIPs were obtained to evaluate the vascular anatomy. CONTRAST:  72mL OMNIPAQUE IOHEXOL 350 MG/ML SOLN COMPARISON:  None. FINDINGS: Cardiovascular: Satisfactory opacification of the pulmonary arteries to the segmental level. No evidence of pulmonary embolism. Normal heart size. No significant pericardial effusion. The thoracic aorta is normal in caliber. Mild atherosclerotic plaque of the thoracic aorta. No coronary artery calcifications. Mediastinum/Nodes: Interval increase in size of a 5.1 x 5.8 cm (from 4.7 x 5 cm) anterior mediastinal mass extending into the aortopulmonic window. The mass is noted to abut the aorta and encase the right main pulmonary artery. No associated right main pulmonary artery air owing. There is a more grossly stable 1.6 cm subcarinal lymph node (from approximately 1.4 cm). No axillary lymph nodes. Thyroid gland, trachea, and esophagus demonstrate no significant findings. Lungs/Pleura: Mild centrilobular emphysematous changes. No focal consolidation. Interval decrease in size of a left upper lobe 2.2 x 1.8 (from 2.9 x 2.2) pulmonary nodule. No new pulmonary nodule or mass. Interval increase in size of a moderate to large volume loculated left pleural effusion with partial collapse of the left lower lobe. No pneumothorax. Upper Abdomen: Subcentimeter hypodensities within the liver too small to characterize.  Musculoskeletal: No suspicious lytic or blastic osseous lesions. No acute displaced fracture. Multilevel degenerative changes of the spine. Review of the MIP images confirms the above findings. IMPRESSION: 1. No pulmonary embolus. 2. Interval increase in size of a moderate to large volume loculated left pleural effusion with partial collapse of the left lower lobe. 3. Interval increase in size of a 5.1 x 5.8 cm (from 4.7 x 5 cm) anterior mediastinal mass extending into the aortopulmonic window. 4. Interval decrease in size of a left upper lobe 2.2 x 1.8 (from 2.9 x 2.2) pulmonary nodule consistent with known malignancy. 5. Grossly stable subcarinal lymphadenopathy. 6. Indeterminate subcentimeter hypodensities within the liver too small to characterize. 7. Aortic Atherosclerosis (ICD10-I70.0) and Emphysema (ICD10-J43.9). Electronically Signed   By: Iven Finn M.D.   On: 08/25/2021 18:18    Pending Labs Unresulted Labs (From admission, onward)     Start     Ordered   08/26/21 0500  TSH  Tomorrow morning,   R        08/25/21 1910   08/26/21 0500  T3  Tomorrow morning,   R        08/25/21 1910   08/26/21 0500  T4, free  Tomorrow morning,   R        08/25/21 1910   08/25/21 1839  Resp Panel by RT-PCR (Flu A&B, Covid) Nasopharyngeal Swab  (Tier 2 - Symptomatic/asymptomatic)  Once,   STAT        08/25/21 1838   08/25/21 1725  APTT  ONCE - STAT,   STAT        08/25/21 1724   08/25/21 1725  Protime-INR  ONCE - STAT,   STAT        08/25/21 1724            Vitals/Pain Today's Vitals   08/25/21 1815 08/25/21 1830 08/25/21 1845 08/25/21 1900  BP: 127/77 137/70 130/89 118/82  Pulse: 82     Resp: 15 20 14 16   Temp:      TempSrc:      SpO2: 100%     Weight:      Height:      PainSc:  Isolation Precautions No active isolations  Medications Medications  heparin ADULT infusion 100 units/mL (25000 units/244mL) (950 Units/hr Intravenous New Bag/Given 08/25/21 1804)  lactated  ringers bolus 1,000 mL (1,000 mLs Intravenous New Bag/Given 08/25/21 1657)  metoprolol tartrate (LOPRESSOR) injection 5 mg (5 mg Intravenous Given 08/25/21 1645)  iohexol (OMNIPAQUE) 350 MG/ML injection 75 mL (75 mLs Intravenous Contrast Given 08/25/21 1735)  heparin bolus via infusion 3,000 Units (3,000 Units Intravenous Bolus from Bag 08/25/21 1800)    Mobility walks High fall risk   Focused Assessments     R Recommendations: See Admitting Provider Note  Report given to:   Additional Notes:

## 2021-08-25 NOTE — Progress Notes (Signed)
Med reconciliation--pt or husband unable to reconcile meds with me.  Per Dr. Julien Nordmann ,it is okay to treat pt today with Alimta and Keytruda and heart rate of 111/min.

## 2021-08-25 NOTE — Progress Notes (Addendum)
Called to infusion center at 14: 40 for patient feeling short of breath after Keytruda and Alimta treatments were complete. Carbo was in the process of being hung, not yet started. At the time of my exam patient admits to feeling back to baseline. Lung exam clear and equal radial pulses. Informed oncologist Dr. Julien Nordmann who agrees with plan to continue treatment and closely monitor. I viewed labs from earlier today and CBC and CMP without acute findings. TSH elevated at 5.540 which appears new compared to previous labs.  Received call at 15:20 for shortness of breath and palpitations and evaluated patient again. Irregular radial pulses noted. Patient had finished treatment and was being observed. She admits to intermittent palpations over the last month she estimated. EKG obtained and shows Afib w/RVR. This is new. Patient denies any chest pain or cardiac history. No history of Afib or PE. Called ED and report given to charge RN. Myself and RN wheeled patient directly to room in ED. No medications given prior to transport.

## 2021-08-26 ENCOUNTER — Observation Stay (HOSPITAL_COMMUNITY): Payer: Medicare Other

## 2021-08-26 ENCOUNTER — Inpatient Hospital Stay (HOSPITAL_COMMUNITY): Payer: Medicare Other

## 2021-08-26 ENCOUNTER — Other Ambulatory Visit: Payer: Self-pay | Admitting: Internal Medicine

## 2021-08-26 ENCOUNTER — Telehealth: Payer: Self-pay | Admitting: Medical Oncology

## 2021-08-26 DIAGNOSIS — I480 Paroxysmal atrial fibrillation: Secondary | ICD-10-CM | POA: Diagnosis present

## 2021-08-26 DIAGNOSIS — Z66 Do not resuscitate: Secondary | ICD-10-CM | POA: Diagnosis not present

## 2021-08-26 DIAGNOSIS — R1310 Dysphagia, unspecified: Secondary | ICD-10-CM | POA: Diagnosis not present

## 2021-08-26 DIAGNOSIS — I82422 Acute embolism and thrombosis of left iliac vein: Secondary | ICD-10-CM | POA: Diagnosis present

## 2021-08-26 DIAGNOSIS — I82432 Acute embolism and thrombosis of left popliteal vein: Secondary | ICD-10-CM | POA: Diagnosis present

## 2021-08-26 DIAGNOSIS — I82402 Acute embolism and thrombosis of unspecified deep veins of left lower extremity: Secondary | ICD-10-CM

## 2021-08-26 DIAGNOSIS — F32A Depression, unspecified: Secondary | ICD-10-CM | POA: Diagnosis present

## 2021-08-26 DIAGNOSIS — Z7984 Long term (current) use of oral hypoglycemic drugs: Secondary | ICD-10-CM | POA: Diagnosis not present

## 2021-08-26 DIAGNOSIS — T451X5A Adverse effect of antineoplastic and immunosuppressive drugs, initial encounter: Secondary | ICD-10-CM | POA: Diagnosis not present

## 2021-08-26 DIAGNOSIS — C7951 Secondary malignant neoplasm of bone: Secondary | ICD-10-CM | POA: Diagnosis present

## 2021-08-26 DIAGNOSIS — D696 Thrombocytopenia, unspecified: Secondary | ICD-10-CM | POA: Diagnosis not present

## 2021-08-26 DIAGNOSIS — R609 Edema, unspecified: Secondary | ICD-10-CM

## 2021-08-26 DIAGNOSIS — C3432 Malignant neoplasm of lower lobe, left bronchus or lung: Secondary | ICD-10-CM

## 2021-08-26 DIAGNOSIS — E43 Unspecified severe protein-calorie malnutrition: Secondary | ICD-10-CM | POA: Diagnosis present

## 2021-08-26 DIAGNOSIS — Z681 Body mass index (BMI) 19 or less, adult: Secondary | ICD-10-CM | POA: Diagnosis not present

## 2021-08-26 DIAGNOSIS — I82442 Acute embolism and thrombosis of left tibial vein: Secondary | ICD-10-CM | POA: Diagnosis present

## 2021-08-26 DIAGNOSIS — J90 Pleural effusion, not elsewhere classified: Secondary | ICD-10-CM | POA: Diagnosis not present

## 2021-08-26 DIAGNOSIS — J910 Malignant pleural effusion: Secondary | ICD-10-CM | POA: Diagnosis not present

## 2021-08-26 DIAGNOSIS — C3492 Malignant neoplasm of unspecified part of left bronchus or lung: Secondary | ICD-10-CM | POA: Diagnosis present

## 2021-08-26 DIAGNOSIS — I4891 Unspecified atrial fibrillation: Secondary | ICD-10-CM | POA: Diagnosis not present

## 2021-08-26 DIAGNOSIS — L8990 Pressure ulcer of unspecified site, unspecified stage: Secondary | ICD-10-CM | POA: Insufficient documentation

## 2021-08-26 DIAGNOSIS — C3490 Malignant neoplasm of unspecified part of unspecified bronchus or lung: Secondary | ICD-10-CM | POA: Diagnosis not present

## 2021-08-26 DIAGNOSIS — Z20822 Contact with and (suspected) exposure to covid-19: Secondary | ICD-10-CM | POA: Diagnosis present

## 2021-08-26 DIAGNOSIS — E222 Syndrome of inappropriate secretion of antidiuretic hormone: Secondary | ICD-10-CM | POA: Diagnosis present

## 2021-08-26 DIAGNOSIS — Y842 Radiological procedure and radiotherapy as the cause of abnormal reaction of the patient, or of later complication, without mention of misadventure at the time of the procedure: Secondary | ICD-10-CM | POA: Diagnosis present

## 2021-08-26 DIAGNOSIS — D638 Anemia in other chronic diseases classified elsewhere: Secondary | ICD-10-CM | POA: Diagnosis present

## 2021-08-26 DIAGNOSIS — I82492 Acute embolism and thrombosis of other specified deep vein of left lower extremity: Secondary | ICD-10-CM | POA: Diagnosis not present

## 2021-08-26 DIAGNOSIS — E782 Mixed hyperlipidemia: Secondary | ICD-10-CM | POA: Diagnosis present

## 2021-08-26 DIAGNOSIS — I82412 Acute embolism and thrombosis of left femoral vein: Secondary | ICD-10-CM | POA: Diagnosis present

## 2021-08-26 DIAGNOSIS — I82452 Acute embolism and thrombosis of left peroneal vein: Secondary | ICD-10-CM | POA: Diagnosis present

## 2021-08-26 DIAGNOSIS — R52 Pain, unspecified: Secondary | ICD-10-CM | POA: Diagnosis present

## 2021-08-26 DIAGNOSIS — C7931 Secondary malignant neoplasm of brain: Secondary | ICD-10-CM | POA: Diagnosis present

## 2021-08-26 DIAGNOSIS — D61818 Other pancytopenia: Secondary | ICD-10-CM | POA: Diagnosis not present

## 2021-08-26 DIAGNOSIS — I700 Atherosclerosis of aorta: Secondary | ICD-10-CM | POA: Diagnosis present

## 2021-08-26 DIAGNOSIS — N132 Hydronephrosis with renal and ureteral calculous obstruction: Secondary | ICD-10-CM | POA: Diagnosis not present

## 2021-08-26 DIAGNOSIS — D701 Agranulocytosis secondary to cancer chemotherapy: Secondary | ICD-10-CM | POA: Diagnosis not present

## 2021-08-26 DIAGNOSIS — E11649 Type 2 diabetes mellitus with hypoglycemia without coma: Secondary | ICD-10-CM | POA: Diagnosis not present

## 2021-08-26 DIAGNOSIS — Z7901 Long term (current) use of anticoagulants: Secondary | ICD-10-CM | POA: Diagnosis not present

## 2021-08-26 DIAGNOSIS — B370 Candidal stomatitis: Secondary | ICD-10-CM | POA: Diagnosis present

## 2021-08-26 LAB — GLUCOSE, CAPILLARY
Glucose-Capillary: 118 mg/dL — ABNORMAL HIGH (ref 70–99)
Glucose-Capillary: 103 mg/dL — ABNORMAL HIGH (ref 70–99)
Glucose-Capillary: 122 mg/dL — ABNORMAL HIGH (ref 70–99)
Glucose-Capillary: 106 mg/dL — ABNORMAL HIGH (ref 70–99)

## 2021-08-26 LAB — CBC WITH DIFFERENTIAL/PLATELET
Abs Immature Granulocytes: 0.11 10*3/uL — ABNORMAL HIGH (ref 0.00–0.07)
Basophils Absolute: 0 10*3/uL (ref 0.0–0.1)
Basophils Relative: 0 %
Eosinophils Absolute: 0 10*3/uL (ref 0.0–0.5)
Eosinophils Relative: 0 %
HCT: 31.8 % — ABNORMAL LOW (ref 36.0–46.0)
Hemoglobin: 10.2 g/dL — ABNORMAL LOW (ref 12.0–15.0)
Immature Granulocytes: 1 %
Lymphocytes Relative: 4 %
Lymphs Abs: 0.4 10*3/uL — ABNORMAL LOW (ref 0.7–4.0)
MCH: 28.7 pg (ref 26.0–34.0)
MCHC: 32.1 g/dL (ref 30.0–36.0)
MCV: 89.6 fL (ref 80.0–100.0)
Monocytes Absolute: 0.8 10*3/uL (ref 0.1–1.0)
Monocytes Relative: 7 %
Neutro Abs: 10.7 10*3/uL — ABNORMAL HIGH (ref 1.7–7.7)
Neutrophils Relative %: 88 %
Platelets: 225 10*3/uL (ref 150–400)
RBC: 3.55 MIL/uL — ABNORMAL LOW (ref 3.87–5.11)
RDW: 18.9 % — ABNORMAL HIGH (ref 11.5–15.5)
WBC: 12.1 10*3/uL — ABNORMAL HIGH (ref 4.0–10.5)
nRBC: 0 % (ref 0.0–0.2)

## 2021-08-26 LAB — BODY FLUID CELL COUNT WITH DIFFERENTIAL
Eos, Fluid: 0 %
Lymphs, Fluid: 32 %
Monocyte-Macrophage-Serous Fluid: 29 % — ABNORMAL LOW (ref 50–90)
Neutrophil Count, Fluid: 39 % — ABNORMAL HIGH (ref 0–25)
Total Nucleated Cell Count, Fluid: 696 cu mm (ref 0–1000)

## 2021-08-26 LAB — PROTEIN, PLEURAL OR PERITONEAL FLUID: Total protein, fluid: 3.5 g/dL

## 2021-08-26 LAB — ECHOCARDIOGRAM LIMITED
Area-P 1/2: 5.02 cm2
Height: 65 in
S' Lateral: 2.6 cm
Weight: 2080 oz

## 2021-08-26 LAB — TSH: TSH: 0.962 u[IU]/mL (ref 0.350–4.500)

## 2021-08-26 LAB — CBC
HCT: 31.9 % — ABNORMAL LOW (ref 36.0–46.0)
Hemoglobin: 10.1 g/dL — ABNORMAL LOW (ref 12.0–15.0)
MCH: 28.5 pg (ref 26.0–34.0)
MCHC: 31.7 g/dL (ref 30.0–36.0)
MCV: 90.1 fL (ref 80.0–100.0)
Platelets: 184 10*3/uL (ref 150–400)
RBC: 3.54 MIL/uL — ABNORMAL LOW (ref 3.87–5.11)
RDW: 18.7 % — ABNORMAL HIGH (ref 11.5–15.5)
WBC: 9.3 10*3/uL (ref 4.0–10.5)
nRBC: 0 % (ref 0.0–0.2)

## 2021-08-26 LAB — COMPREHENSIVE METABOLIC PANEL
ALT: 11 U/L (ref 0–44)
AST: 16 U/L (ref 15–41)
Albumin: 2 g/dL — ABNORMAL LOW (ref 3.5–5.0)
Alkaline Phosphatase: 237 U/L — ABNORMAL HIGH (ref 38–126)
Anion gap: 9 (ref 5–15)
BUN: 12 mg/dL (ref 8–23)
CO2: 23 mmol/L (ref 22–32)
Calcium: 7.7 mg/dL — ABNORMAL LOW (ref 8.9–10.3)
Chloride: 98 mmol/L (ref 98–111)
Creatinine, Ser: 0.38 mg/dL — ABNORMAL LOW (ref 0.44–1.00)
GFR, Estimated: >60 mL/min (ref >60)
Glucose, Bld: 154 mg/dL — ABNORMAL HIGH (ref 70–99)
Potassium: 3.5 mmol/L (ref 3.5–5.1)
Sodium: 130 mmol/L — ABNORMAL LOW (ref 135–145)
Total Bilirubin: 0.6 mg/dL (ref 0.3–1.2)
Total Protein: 6.1 g/dL — ABNORMAL LOW (ref 6.5–8.1)

## 2021-08-26 LAB — LACTATE DEHYDROGENASE: LDH: 242 U/L — ABNORMAL HIGH (ref 98–192)

## 2021-08-26 LAB — HEPARIN LEVEL (UNFRACTIONATED): Heparin Unfractionated: 1 IU/mL — ABNORMAL HIGH (ref 0.30–0.70)

## 2021-08-26 LAB — GLUCOSE, PLEURAL OR PERITONEAL FLUID: Glucose, Fluid: 128 mg/dL

## 2021-08-26 LAB — T4, FREE: Free T4: 1.11 ng/dL (ref 0.61–1.12)

## 2021-08-26 LAB — ALBUMIN: Albumin: 2.1 g/dL — ABNORMAL LOW (ref 3.5–5.0)

## 2021-08-26 LAB — MRSA NEXT GEN BY PCR, NASAL: MRSA by PCR Next Gen: NOT DETECTED

## 2021-08-26 MED ORDER — ZINC OXIDE 12.8 % EX OINT
TOPICAL_OINTMENT | Freq: Three times a day (TID) | CUTANEOUS | Status: DC
  Administered 2021-08-26 – 2021-08-28 (×2): 1 via TOPICAL
  Filled 2021-08-26 (×2): qty 56.7

## 2021-08-26 MED ORDER — HYDROCODONE-ACETAMINOPHEN 5-325 MG PO TABS: 1.0000 | ORAL_TABLET | Freq: Four times a day (QID) | ORAL | 0 refills | Status: DC | PRN

## 2021-08-26 MED ORDER — HEPARIN (PORCINE) 25000 UT/250ML-% IV SOLN
800.0000 [IU]/h | INTRAVENOUS | Status: DC
  Filled 2021-08-26: qty 250

## 2021-08-26 MED ORDER — LIDOCAINE HCL 1 % IJ SOLN
INTRAMUSCULAR | Status: AC
  Administered 2021-08-26: 15 mL
  Filled 2021-08-26: qty 20

## 2021-08-26 MED ORDER — POLYETHYLENE GLYCOL 3350 17 G PO PACK
17.0000 g | PACK | Freq: Every day | ORAL | Status: DC
  Administered 2021-08-26 – 2021-08-27 (×2): 17 g via ORAL
  Filled 2021-08-26 (×2): qty 1

## 2021-08-26 MED ORDER — FLUCONAZOLE 100 MG PO TABS: ORAL_TABLET | ORAL | 0 refills | Status: DC

## 2021-08-26 MED ORDER — FOLIC ACID 1 MG PO TABS: 1.0000 mg | ORAL_TABLET | Freq: Every day | ORAL | 2 refills | Status: DC

## 2021-08-26 MED ORDER — SENNOSIDES-DOCUSATE SODIUM 8.6-50 MG PO TABS
1.0000 | ORAL_TABLET | Freq: Two times a day (BID) | ORAL | Status: DC
  Administered 2021-08-26 – 2021-09-02 (×11): 1 via ORAL
  Filled 2021-08-26 (×12): qty 1

## 2021-08-26 MED ORDER — AQUAPHOR EX OINT
TOPICAL_OINTMENT | Freq: Two times a day (BID) | CUTANEOUS | Status: DC | PRN
  Filled 2021-08-26: qty 50

## 2021-08-26 MED ORDER — ENSURE ENLIVE PO LIQD
237.0000 mL | Freq: Three times a day (TID) | ORAL | Status: DC
  Administered 2021-08-26 – 2021-09-13 (×26): 237 mL via ORAL

## 2021-08-26 MED ORDER — NYSTATIN 100000 UNIT/ML MT SUSP
5.0000 mL | Freq: Four times a day (QID) | OROMUCOSAL | Status: DC
  Administered 2021-08-26 – 2021-09-03 (×28): 500000 [IU] via ORAL
  Filled 2021-08-26 (×26): qty 5

## 2021-08-26 MED ORDER — FENTANYL 50 MCG/HR TD PT72: 1.0000 | MEDICATED_PATCH | TRANSDERMAL | 0 refills | Status: DC

## 2021-08-26 MED ORDER — ENOXAPARIN SODIUM 60 MG/0.6ML IJ SOSY
60.0000 mg | PREFILLED_SYRINGE | Freq: Two times a day (BID) | INTRAMUSCULAR | Status: DC
  Administered 2021-08-26 (×2): 60 mg via SUBCUTANEOUS
  Filled 2021-08-26 (×2): qty 0.6

## 2021-08-26 MED ORDER — METOPROLOL SUCCINATE ER 25 MG PO TB24
25.0000 mg | ORAL_TABLET | Freq: Every day | ORAL | Status: DC
  Administered 2021-08-26 – 2021-08-27 (×2): 25 mg via ORAL
  Filled 2021-08-26 (×2): qty 1

## 2021-08-26 NOTE — Telephone Encounter (Signed)
Requested Med refills-Fentanyl and hydrocodone, folic acid.   Pt told Mallie Mussel she is being discharged tomorrow. Rion was given a fentanyl patch in the hospital today at [facility] said she does not have any more patches at home and only had 2 hydrocodone left.  Folic acid was refilled today .  Message sent to [facility].  Please send to [place].

## 2021-08-26 NOTE — Progress Notes (Signed)
Initial Nutrition Assessment  DOCUMENTATION CODES:  Severe malnutrition in context of chronic illness  INTERVENTION:  Increase Ensure from BID to TID.  Add orange sherbet with dinner meals - RD to order.  Continue MVI with minerals and folic acid daily.  Encourage PO and supplement intake.  NUTRITION DIAGNOSIS:  Severe Malnutrition related to chronic illness, cancer and cancer related treatments as evidenced by severe fat depletion, severe muscle depletion.  GOAL:  Patient will meet greater than or equal to 90% of their needs  MONITOR:  PO intake, Supplement acceptance, Labs, Weight trends, Skin, I & O's  REASON FOR ASSESSMENT:  Malnutrition Screening Tool    ASSESSMENT:  65 yo female with a PMH of stage IV non-small cell lung cancer, diabetes mellitus type 2, hyperlipidemia who has been recently diagnosed with lung cancer receiving chemotherapy was found to be having palpitations and found to be in A. fib with RVR at the chemotherapy infusion center and was referred to the ER.  Patient states over the last 2 months patient has been having intermittent episodes of palpitations with some chest pressure.  Also has been chronically short of breath. Admitted with A-fib with RVR.  Spoke with pt at bedside. Pt reports having no appetite for the past 4-5 weeks now and she is struggling to make herself eat.  She reports a 20 lb weight loss recently. Prior to admission stated weight, it appears that this was occurring. RD to order measured weight to determine true weight change.  Of note, pt with mild BLE edema.  Spoke with pt about supplements. RD to order Ensure TID and orange sherbet at dinner for pt.   Continue MVI with minerals and folic acid daily.  Supplements: Ensure BID  Medications: reviewed; folic acid, MVI with minerals, miralax, Senokot BID  Labs: reviewed; Na 130 (L), CBG 103-141 (H) HbA1c: 6.3% (06/15/2021)  NUTRITION - FOCUSED PHYSICAL EXAM: Flowsheet Row Most  Recent Value  Orbital Region Severe depletion  Upper Arm Region Severe depletion  Thoracic and Lumbar Region Moderate depletion  Buccal Region Severe depletion  Temple Region Severe depletion  Clavicle Bone Region Severe depletion  Clavicle and Acromion Bone Region Severe depletion  Scapular Bone Region Severe depletion  Dorsal Hand Severe depletion  Patellar Region Mild depletion  Anterior Thigh Region Mild depletion  Posterior Calf Region Moderate depletion  Edema (RD Assessment) Mild  [BLE]  Hair Reviewed  Eyes Reviewed  Mouth Reviewed  Skin Reviewed  Nails Reviewed   Diet Order:   Diet Order             Diet heart healthy/carb modified Room service appropriate? Yes; Fluid consistency: Thin  Diet effective now                  EDUCATION NEEDS:  Education needs have been addressed  Skin:  Skin Assessment: Skin Integrity Issues: Skin Integrity Issues:: Stage II Stage II: Coccyx  Last BM:  unsure per pt report  Height:  Ht Readings from Last 1 Encounters:  08/25/21 5\' 5"  (1.651 m)   Weight:  Wt Readings from Last 1 Encounters:  08/25/21 59 kg   BMI:  Body mass index is 21.63 kg/m.  Estimated Nutritional Needs:  Kcal:  2100-2300 Protein:  80-95 grams Fluid:  >2.1 L  Derrel Nip, RD, LDN (she/her/hers) Clinical Inpatient Dietitian RD Pager/After-Hours/Weekend Pager # in [place]
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
## 2021-08-26 NOTE — Progress Notes (Addendum)
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 spent: 85mns Greater than 50% of this time was spent in counseling, explanation of diagnosis, planning of further management, and coordination of care.   Voice Recognition /Viviann Sparedictation system was used to create this note, attempts have been made to correct errors. Please contact the author with questions and/or clarifications.   FFlorencia Reasons MD PhD FACP [facility]  Available via Epic secure chat 7am-7pm for nonurgent issues Please page for urgent issues To page the attending provider between 7A-7P or the covering provider  during after hours 7P-7A, please log into the web site [email or web address] and access using universal [place] password for that web site. If you do not have the password, please call the hospital operator.    08/26/2021, 4:25 PM

## 2021-08-26 NOTE — Consult Note (Signed)
WOC Nurse Consult Note: Patient receiving care in [place] 1430. Reason for Consult: sacral/coccyx wound Wound type: MASD-IAD; patient was incontinent of urine at time of my assessment Pressure Injury POA: Yes/No/NA Measurement: na Wound bed: 3 distinct very very small spots of partial thickness impairment present in the coccyx area. Sacral foam present at time of my visit.  Drainage (amount, consistency, odor) na Periwound: areas of hypopigmentation, but intact and healed Dressing procedure/placement/frequency: Wash sacrum and buttocks, then apply Triple Paste. Do not use a sacral foam dressing.  BID Aquaphor on dry skin was also ordered.  Monitor the wound area(s) for worsening of condition such as: Signs/symptoms of infection,  Increase in size,  Development of or worsening of odor, Development of pain, or increased pain at the affected locations.  Notify the medical team if any of these develop.  Thank you for the consult.  Discussed plan of care with the patient and bedside nurse.  [place] nurse will not follow at this time.  Please re-consult the [place] team if needed.  Val Riles, RN, MSN, CWOCN, CNS-BC, pager [phone]

## 2021-08-26 NOTE — Progress Notes (Signed)
ANTICOAGULATION CONSULT NOTE  Pharmacy Consult for Heparin  Indication: atrial fibrillation  Allergies  Allergen Reactions   Ibuprofen Nausea And Vomiting   Trazodone And Nefazodone     AM Dizziness & Drowsiness     Patient Measurements: Height: 5\' 5"  (165.1 cm) Weight: 59 kg (130 lb) IBW/kg (Calculated) : 57 Heparin Dosing Weight: 59 kg  Vital Signs: Temp: 98.2 F (36.8 C) (11/22 2347) Temp Source: Oral (11/22 2043) BP: 94/64 (11/22 2347) Pulse Rate: 78 (11/22 2347)  Labs: Recent Labs    08/25/21 1149 08/25/21 1620 08/25/21 1800 08/25/21 2102 08/26/21 0017  HGB 10.3* 10.1*  --   --  10.1*  HCT 31.7* 31.5*  --   --  31.9*  PLT 208 205  --   --  184  APTT  --   --   --  189*  --   LABPROT  --   --   --  16.0*  --   INR  --   --   --  1.3*  --   HEPARINUNFRC  --   --   --   --  1.00*  CREATININE 0.53 0.45  --   --  0.38*  TROPONINIHS  --  8 7  --   --      Estimated Creatinine Clearance: 64.8 mL/min (A) (by C-G formula based on SCr of 0.38 mg/dL (L)).   Medical History: Past Medical History:  Diagnosis Date   Acute pain of left shoulder 12/01/2020   Anxiety    Aspiration pneumonitis ([place]) 08/05/2019   Breast cancer screening 12/30/2017   Chest wall pain 11/05/2019   Cocaine abuse ([place])    Depression    Drug intoxication ([place]) 08/05/2019   Encounter for screening for HIV 08/05/2019   Goiter 09/05/2019   History of recurrent UTIs    No-show for appointment 09/24/2020   Pneumonia    Pre-diabetes    Urinary frequency 04/10/2013    Assessment: 65 y/o F with sent to ED from oncology infusion center for for atrial fibrillation with rapid ventricular response. Pharmacy consulted to manage heparin infusion. Patient was not on blood thinners PTA.   08/26/2021: Initial heparin level 1.0- supra-therapeutic on IV heparin 950 units/hr CBC: Hg low but stable at 10.1; pltc WNL but decreasing 208>>184 No bleeding or infusion related issues reported by  RN Verified lab drawn correctly from opposite arm  Goal of Therapy:  Heparin level 0.3-0.7 units/ml Monitor platelets by anticoagulation protocol: Yes   Plan:  Hold heparin x1 hour then resume at 800 units/hr Recheck heparin level in 8hrs after resumed Daily heparin level & CBC while on heparin  Netta Cedars PharmD 08/26/2021,12:52 AM

## 2021-08-26 NOTE — Procedures (Addendum)
Ultrasound-guided diagnostic and therapeutic left thoracentesis performed yielding 320 cc of yellow fluid. No additional free fluid noted on f/u US. No immediate complications. Follow-up chest x-ray pending. The fluid was sent to the lab for preordered studies. EBL none.

## 2021-08-26 NOTE — TOC Initial Note (Signed)
Transition of Care [facility]) - Initial/Assessment Note    Patient Details  Name: Kim Ross MRN: [id number] Date of Birth: Feb 20, 1956  Transition of [facility]) CM/SW Contact:    Leeroy Cha, RN Phone Number: 08/26/2021, 8:24 AM  Clinical Narrative:                  65 y.o. female with history of stage IV non-small cell lung cancer, diabetes mellitus type 2, hyperlipidemia who has been recently diagnosed with lung cancer receiving chemotherapy was found to be having palpitations and found to be in A. fib with RVR at the chemotherapy infusion center and was referred to the ER.  Patient states over the last 2 months patient has been having intermittent episodes of palpitations with some chest pressure.  Also has been chronically short of breath.  Denies any fever chills productive cough.   ED Course: In the ER patient was found to be in A. fib with RVR and before patient can be given rate limiting medication patient converted to sinus rhythm.  Since patient was short of breath patient underwent CT angiogram of the chest which shows large left-sided loculated pleural effusion.  COVID test was negative.  Labs are largely at baseline with hemoglobin around 10 elevated alkaline phosphatase and hyponatremia sodium of around 131 patient was seen by cardiologist.  Patient is on heparin at this time. TOC PLAN OF CARE: Following for hhc needs none at this time Following for progression: iv abx x2,iv heparin Expected Discharge Plan: Home/Self Care Barriers to Discharge: Continued Medical Work up   Patient Goals and CMS Choice Patient states their goals for this hospitalization and ongoing recovery are:: to go home CMS Medicare.gov Compare Post Acute Care list provided to:: Patient    Expected Discharge Plan and Services Expected Discharge Plan: Home/Self Care   Discharge Planning Services: CM Consult   Living arrangements for the past 2 months: Single Family Home                                       Prior Living Arrangements/Services Living arrangements for the past 2 months: Single Family Home Lives with:: Self Patient language and need for interpreter reviewed:: Yes Do you feel safe going back to the place where you live?: Yes            Criminal Activity/Legal Involvement Pertinent to Current Situation/Hospitalization: No - Comment as needed  Activities of Daily Living Home Assistive Devices/Equipment: Walker (specify type) (bedpan) ADL Screening (condition at time of admission) Patient's cognitive ability adequate to safely complete daily activities?: Yes Is the patient deaf or have difficulty hearing?: No Does the patient have difficulty seeing, even when wearing glasses/contacts?: No Does the patient have difficulty concentrating, remembering, or making decisions?: Yes (at times) Patient able to express need for assistance with ADLs?: Yes Does the patient have difficulty dressing or bathing?: Yes Independently performs ADLs?: No Communication: Independent Dressing (OT): Needs assistance Is this a change from baseline?: Pre-admission baseline Grooming: Independent Feeding: Independent Bathing: Needs assistance Is this a change from baseline?: Pre-admission baseline Toileting: Needs assistance Is this a change from baseline?: Pre-admission baseline In/Out Bed: Needs assistance Is this a change from baseline?: Pre-admission baseline Walks in Home: Needs assistance Is this a change from baseline?: Pre-admission baseline Does the patient have difficulty walking or climbing stairs?: Yes Weakness of Legs: Both Weakness of Arms/Hands: Both  Permission Sought/Granted                  Emotional Assessment Appearance:: Appears stated age Attitude/Demeanor/Rapport: Engaged Affect (typically observed): Calm Orientation: : Oriented to Place, Oriented to Self, Oriented to  Time, Oriented to Situation Alcohol / Substance Use: Not Applicable Psych  Involvement: No (comment)  Admission diagnosis:  Pain [R52] Atrial fibrillation with RVR (HCC) [I48.91] Patient Active Problem List   Diagnosis Date Noted   Atrial fibrillation with RVR ([place]) 08/25/2021   Pleural effusion 08/25/2021   Brain metastasis ([place]) 07/15/2021   Adenocarcinoma of left lung, stage 4 ([place]) 07/08/2021   Weight loss 07/08/2021   Lung cancer ([place]) 06/24/2021   Cancer related pain 06/24/2021   Malnutrition of moderate degree 06/16/2021   Tobacco abuse 06/15/2021   Lobar pneumonia ([place]) 06/15/2021   SIRS (systemic inflammatory response syndrome) ([place]) 06/14/2021   Diabetes ([place]) 06/14/2021   Dyspnea 05/03/2021   Lung mass 05/03/2021   Fatigue 03/26/2021   Hyperkeratosis of nail 03/11/2021   Prediabetes 02/05/2021   Stress at home 02/05/2021   Psychophysiological insomnia 02/05/2021   Dry skin 11/17/2020   Onychomycosis of left great toe 09/05/2019   Seasonal allergies 12/30/2017   Hyperlipidemia 12/30/2017   UTI (urinary tract infection) 02/09/2017   Hematuria 04/01/2015   History of recurrent UTIs 01/31/2014   Vertigo, intermittent 06/27/2013   Generalized anxiety disorder 04/10/2013   Irritable bowel syndrome 10/19/2012   Tobacco use disorder 07/20/2012   PCP:  Erskine Emery, MD Pharmacy:   [id number] - [place], [place] [address] Phone: [phone] Fax: [phone]     Social Determinants of Health (SDOH) Interventions    Readmission Risk Interventions No flowsheet data found.

## 2021-08-26 NOTE — Progress Notes (Signed)
  Echocardiogram 2D Echocardiogram has been performed.  Fidel Levy 08/26/2021, 12:13 PM

## 2021-08-26 NOTE — Progress Notes (Signed)
Left lower extremity venous duplex and IVC/iliac vein duplex completed. Refer to "CV Proc" under chart review to view preliminary results.  Preliminary results discussed with Dr. Erlinda Hong and Jerene Pitch, [place].  08/26/2021 10:52 AM Kelby Aline., MHA, RVT, RDCS, RDMS

## 2021-08-27 ENCOUNTER — Inpatient Hospital Stay (HOSPITAL_COMMUNITY): Payer: Medicare Other

## 2021-08-27 ENCOUNTER — Encounter (HOSPITAL_COMMUNITY): Payer: Self-pay | Admitting: Internal Medicine

## 2021-08-27 DIAGNOSIS — I4891 Unspecified atrial fibrillation: Secondary | ICD-10-CM | POA: Diagnosis not present

## 2021-08-27 DIAGNOSIS — R52 Pain, unspecified: Secondary | ICD-10-CM | POA: Diagnosis not present

## 2021-08-27 DIAGNOSIS — J90 Pleural effusion, not elsewhere classified: Secondary | ICD-10-CM | POA: Diagnosis not present

## 2021-08-27 DIAGNOSIS — Z7984 Long term (current) use of oral hypoglycemic drugs: Secondary | ICD-10-CM

## 2021-08-27 DIAGNOSIS — I82492 Acute embolism and thrombosis of other specified deep vein of left lower extremity: Secondary | ICD-10-CM

## 2021-08-27 DIAGNOSIS — Z79899 Other long term (current) drug therapy: Secondary | ICD-10-CM

## 2021-08-27 DIAGNOSIS — C3492 Malignant neoplasm of unspecified part of left bronchus or lung: Secondary | ICD-10-CM | POA: Diagnosis not present

## 2021-08-27 DIAGNOSIS — Z7901 Long term (current) use of anticoagulants: Secondary | ICD-10-CM

## 2021-08-27 DIAGNOSIS — C3490 Malignant neoplasm of unspecified part of unspecified bronchus or lung: Secondary | ICD-10-CM

## 2021-08-27 LAB — BASIC METABOLIC PANEL
Anion gap: 10 (ref 5–15)
BUN: 18 mg/dL (ref 8–23)
CO2: 23 mmol/L (ref 22–32)
Calcium: 8 mg/dL — ABNORMAL LOW (ref 8.9–10.3)
Chloride: 100 mmol/L (ref 98–111)
Creatinine, Ser: 0.41 mg/dL — ABNORMAL LOW (ref 0.44–1.00)
GFR, Estimated: >60 mL/min (ref >60)
Glucose, Bld: 91 mg/dL (ref 70–99)
Potassium: 3.4 mmol/L — ABNORMAL LOW (ref 3.5–5.1)
Sodium: 133 mmol/L — ABNORMAL LOW (ref 135–145)

## 2021-08-27 LAB — CORTISOL: Cortisol, Plasma: 3.8 ug/dL

## 2021-08-27 LAB — GLUCOSE, CAPILLARY
Glucose-Capillary: 96 mg/dL (ref 70–99)
Glucose-Capillary: 84 mg/dL (ref 70–99)
Glucose-Capillary: 90 mg/dL (ref 70–99)
Glucose-Capillary: 90 mg/dL (ref 70–99)

## 2021-08-27 LAB — URIC ACID: Uric Acid, Serum: 4.8 mg/dL (ref 2.5–7.1)

## 2021-08-27 LAB — OSMOLALITY, URINE: Osmolality, Ur: 564 mOsm/kg (ref 300–900)

## 2021-08-27 LAB — T3: T3, Total: 79 ng/dL (ref 71–180)

## 2021-08-27 LAB — SODIUM, URINE, RANDOM: Sodium, Ur: 12 mmol/L

## 2021-08-27 LAB — OSMOLALITY: Osmolality: 290 mOsm/kg (ref 275–295)

## 2021-08-27 LAB — MAGNESIUM: Magnesium: 1.4 mg/dL — ABNORMAL LOW (ref 1.7–2.4)

## 2021-08-27 MED ORDER — POLYETHYLENE GLYCOL 3350 17 G PO PACK
17.0000 g | PACK | Freq: Two times a day (BID) | ORAL | Status: DC
  Administered 2021-08-27 – 2021-09-02 (×7): 17 g via ORAL
  Filled 2021-08-27 (×9): qty 1

## 2021-08-27 MED ORDER — GLUCERNA SHAKE PO LIQD
237.0000 mL | Freq: Three times a day (TID) | ORAL | Status: DC
  Administered 2021-08-27: 237 mL via ORAL
  Filled 2021-08-27 (×3): qty 237

## 2021-08-27 MED ORDER — AMOXICILLIN-POT CLAVULANATE 500-125 MG PO TABS
1.0000 | ORAL_TABLET | Freq: Two times a day (BID) | ORAL | Status: AC
  Administered 2021-08-27 – 2021-08-30 (×4): 500 mg via ORAL
  Filled 2021-08-27 (×6): qty 1

## 2021-08-27 MED ORDER — IOHEXOL 350 MG/ML SOLN
80.0000 mL | Freq: Once | INTRAVENOUS | Status: AC | PRN
  Administered 2021-08-27: 80 mL via INTRAVENOUS

## 2021-08-27 MED ORDER — POTASSIUM CHLORIDE 20 MEQ PO PACK
40.0000 meq | PACK | Freq: Two times a day (BID) | ORAL | Status: DC
  Administered 2021-08-27 (×2): 40 meq via ORAL
  Filled 2021-08-27 (×2): qty 2

## 2021-08-27 MED ORDER — TAMSULOSIN HCL 0.4 MG PO CAPS
0.4000 mg | ORAL_CAPSULE | Freq: Every day | ORAL | Status: DC
  Administered 2021-08-27 – 2021-09-28 (×31): 0.4 mg via ORAL
  Filled 2021-08-27 (×35): qty 1

## 2021-08-27 MED ORDER — CHLORHEXIDINE GLUCONATE CLOTH 2 % EX PADS
6.0000 | MEDICATED_PAD | Freq: Every day | CUTANEOUS | Status: DC
  Administered 2021-08-28 – 2021-09-02 (×6): 6 via TOPICAL

## 2021-08-27 MED ORDER — DOCUSATE SODIUM 100 MG PO CAPS: 100.0000 mg | ORAL_CAPSULE | Freq: Two times a day (BID) | ORAL | Status: DC | PRN

## 2021-08-27 MED ORDER — MAGNESIUM SULFATE 2 GM/50ML IV SOLN
2.0000 g | Freq: Once | INTRAVENOUS | Status: AC
  Administered 2021-08-27: 2 g via INTRAVENOUS
  Filled 2021-08-27: qty 50

## 2021-08-27 MED ORDER — ENOXAPARIN SODIUM 60 MG/0.6ML IJ SOSY
50.0000 mg | PREFILLED_SYRINGE | Freq: Two times a day (BID) | INTRAMUSCULAR | Status: DC
Start: 2021-08-27 — End: 2021-08-31
  Administered 2021-08-27 – 2021-08-31 (×9): 50 mg via SUBCUTANEOUS
  Filled 2021-08-27: qty 0.5
  Filled 2021-08-27 (×3): qty 0.6
  Filled 2021-08-27 (×7): qty 0.5

## 2021-08-27 NOTE — Consult Note (Signed)
Palliative Care Consult Note                                  Date: 08/27/2021   Patient Name: Kim Ross  DOB: 11-07-1955  MRN: [id number]  Age / Sex: 65 y.o., female  PCP: Erskine Emery, MD Referring Physician: Florencia Reasons, MD  Reason for Consultation: Establishing goals of care  HPI/Patient Profile: Palliative Care consult requested for goals of care discussion in this 65 y.o. 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 She was admitted on 08/25/2021 from the [facility] after receiving chemotherapy found to have palpitations and atrial fibrillation.  .   Past Medical History:  Diagnosis Date  . Acute pain of left shoulder 12/01/2020  . Anxiety   . Aspiration pneumonitis ([place]) 08/05/2019  . Breast cancer screening 12/30/2017  . Chest wall pain 11/05/2019  . Cocaine abuse ([place])   . Depression   . Drug intoxication ([place]) 08/05/2019  . Encounter for screening for HIV 08/05/2019  . Goiter 09/05/2019  . History of recurrent UTIs   . No-show for appointment 09/24/2020  . Pneumonia   . Pre-diabetes   . Urinary frequency 04/10/2013     Subjective:   This NP Kim Ross reviewed medical records, received report from team, assessed the patient and then met at the patient's bedside with her to discuss diagnosis, prognosis, GOC, EOL wishes disposition and options.  Kim Ross is awake, alert, and oriented x3. Complains of some back and left leg pain.    Concept of Palliative Care was introduced as specialized medical care for people and their families living with serious illness.  It focuses on providing relief from the symptoms and stress of a serious illness.  The goal is to improve quality of life for both the patient and the family. Values and goals of care  important to patient and family were attempted to be elicited.  I created space and opportunity for patient to explore state of health prior to admission, thoughts, and feelings. She shares prior to admission she was experiencing some generalized weakness. Reports she is generally tired and fatigued after chemotherapy treatments for at least 48 hrs. Endorses shortness of breath with exertion. Does not currently have home oxygen. States she is ambulatory with walker or cane due to weakness. Endorses decreased appetite and 40 lb weight loss over 6 past months.   We discussed Her current illness and what it means in the larger context of Her on-going co-morbidities. Natural disease trajectory and expectations were discussed. We discussed at length her metastatic cancer and current treatments.   Kim Ross verbalized understanding of current illness and co-morbidities. She feels she is tolerating chemotherapy fairly well. She states she knows that her prognosis is guarded-poor however she is relying on her strong faith in God. She shares she was raised Catholic however currently associates as Engineer, manufacturing. She is emotional sharing the loss of several family members that have passed away due to cancer.   I discussed the importance of continued conversation with family and their medical providers regarding overall plan of care and treatment options, ensuring decisions are within the context of the patients values and GOCs.  Questions and concerns were addressed.  Patient was encouraged to call with questions or concerns.  PMT will continue to support  holistically as needed.  Life Review: Kim Ross lives in the home with her husband of 18 years (although they have been together longer), 50 year old daughter, and 28 month old grandson. Kim Ross is open about her husband and she being recovering addicts. She has 4 children who she adopted (they are actually her husbands nieces/nephews). 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    Objective:   Primary Diagnoses: Present on Admission: . Atrial fibrillation with RVR ([place]) . Pleural effusion . Adenocarcinoma of left lung, stage 4 ([place]) . A-fib (HCC)   Scheduled Meds: . atorvastatin  40 mg Oral Daily  . enoxaparin (LOVENOX) injection  50 mg Subcutaneous BID  . feeding supplement  237 mL Oral TID BM  . fentaNYL  1 patch Transdermal Q72H  . folic acid  1 mg Oral Daily  . insulin aspart  0-9 Units Subcutaneous TID WC  . meclizine  25 mg Oral BID  . metoprolol succinate  25 mg Oral Daily  . multivitamin with minerals  1 tablet Oral QODAY  . nystatin  5 mL Oral QID  . polyethylene glycol  17 g Oral Daily  . potassium chloride  40 mEq Oral BID  . senna-docusate  1 tablet Oral BID  . sertraline  50 mg Oral Daily  . Zinc Oxide   Topical TID    Continuous Infusions: . ceFEPime (MAXIPIME) IV 2 g (08/27/21 0521)    PRN Meds: HYDROcodone-acetaminophen, mineral oil-hydrophilic petrolatum  Allergies  Allergen Reactions  . Ibuprofen Nausea And Vomiting  . Trazodone And Nefazodone     AM Dizziness & Drowsiness     Review of Systems  Constitutional:  Positive for appetite change, fatigue and unexpected weight change.  Respiratory:  Positive for shortness of breath.   Musculoskeletal:  Positive for arthralgias.  Neurological:  Positive for weakness.  Unless otherwise noted, a complete review of systems is negative.  Physical Exam General: NAD, -ill appearing Cardiovascular: regular rate and rhythm Pulmonary: diminished bilaterally  Abdomen: soft, nontender, + bowel sounds Extremities: no edema, no joint deformities Skin: no rashes, warm and dry Neurological: AAO x3.   Vital Signs:  BP 114/72 (BP Location: Right Arm)   Ross 73   Temp 97.8 F (36.6 C) (Oral)   Resp 20   Ht 5' 5" (1.651 m)   Wt 52.9 kg   SpO2 100%   BMI 19.41 kg/m  Pain Scale: 0-10   Pain Score: 7   SpO2: SpO2: 100 % O2  Device:SpO2: 100 % O2 Flow Rate: .   IO: Intake/output summary: No intake or output data in the 24 hours ending 08/27/21 1156  LBM: Last BM Date:  (unsure per pt) Baseline Weight: Weight: 59 kg Most recent weight: Weight: 52.9 kg      Palliative Assessment/Data: PPS 30%   Advanced Care Planning:   Primary Decision Maker: NEXT OF KIN (Donzi [place]) and [place]  Code Status/Advance Care Planning: DNR  A discussion was had today regarding advanced directives. Concepts specific to code status, artifical feeding and hydration, continued IV antibiotics and rehospitalization was had.    I discussed at length patient's current full code status with consideration to her current illness and co-morbidities. Recommendations for DNR/DNI provided. Carisa verbalizes understanding and expresses wishes for DNR. Education provided on what a DNR/DNI would look like. She verbalized understanding and confirmed her wishes.   The difference between a aggressive medical intervention path and a palliative comfort care  path was discussed.   Hospice and Palliative Care services outpatient were explained and offered. Patient verbalized understanding and awareness of both palliative and hospice's goals and philosophy of care. She emphasizes she is NOT interested in hospice. We discussed ongoing outpatient Palliative support. Patient aware that I am available to follow at [facility] in collaboration with her Oncology team. She expressed appreciation and would like continued support.   Assessment & Plan:   SUMMARY OF RECOMMENDATIONS   DNR/DNI-as requested and confirmed by patient Continue with current plan of care. Annisha is clear in expressed wishes to continue to treat the treatable. She is remaining hopeful for some stability. States she is not interested in comfort-focused care or hospice. Is relying on her strong faith in God for continued support.  Recommend ongoing goals of care discussions. Recommended  completion of advanced directives. Introduction to MOST.  Patient request outpatient palliative support. She would like continued follow-up at [facility] at discharge. I will plan to schedule follow-up with me at discharge.  PMT will continue to support and follow as needed. Please call team line with urgent needs.   Palliative Prophylaxis:  Frequent Pain Assessment  Additional Recommendations (Limitations, Scope, Preferences): Full Scope Treatment and DNR/DNI ,treat the treatable   Psycho-social/Spiritual:  Desire for further Chaplaincy support: yes Additional Recommendations:  Ongoing goals of care discussions  Prognosis:  Guarded-Poor   Discharge Planning:  To Be Determined -Expressed goal is to return home with family support.   Discussed with: RN and Dr. Erlinda Hong.     Patient expressed understanding and was in agreement with this plan.   Time In: 1100 Time Out: 1155 Time Total: 55 min.   Visit consisted of counseling and education dealing with the complex and emotionally intense issues of symptom management and palliative care in the setting of serious and potentially life-threatening illness.Greater than 50%  of this time was spent counseling and coordinating care related to the above assessment and plan.  Signed by:  Alda Lea, AGPCNP-BC Palliative Medicine Team  Phone: [phone] Pager: [phone] Amion: Bjorn Pippin   Thank you for allowing the Palliative Medicine Team to assist in the care of this patient. Please utilize secure chat with additional questions, if there is no response within 30 minutes please call the above phone number. Palliative Medicine Team providers are available by phone from 7am to 5pm daily and can be reached through the team cell phone.  Should this patient require assistance outside of these hours, please call the patient's attending physician.

## 2021-08-27 NOTE — TOC Progression Note (Signed)
Transition of Care [facility]) - Progression Note    Patient Details  Name: Kim Ross MRN: [id number] Date of Birth: Apr 18, 1956  Transition of Care [facility]) CM/SW Contact  Chad Tiznado, Juliann Pulse, RN Phone Number: 08/27/2021, 9:29 AM  Clinical Narrative:  Noted consult for benefit check on Lovenox-Insurance companies closed today-MD notifed to send script to pharmacy-patient has insurance-has an obligated co pay.Continue to follow for d/c needs.     Expected Discharge Plan: Home/Self Care Barriers to Discharge: Continued Medical Work up  Expected Discharge Plan and Services Expected Discharge Plan: Home/Self Care   Discharge Planning Services: CM Consult   Living arrangements for the past 2 months: Single Family Home                                       Social Determinants of Health (SDOH) Interventions    Readmission Risk Interventions No flowsheet data found.

## 2021-08-27 NOTE — Progress Notes (Signed)
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 artery systolic pressure. The estimated right ventricular systolic pressure is 57.3 mmHg.  3. The mitral valve is normal in structure. Trivial mitral valve regurgitation. No evidence of mitral stenosis.  4. The aortic valve was not well visualized. Aortic valve regurgitation is not visualized. No aortic stenosis is present.  5. The inferior vena cava is normal in size with greater than 50% respiratory variability, suggesting right atrial pressure of 3 mmHg. FINDINGS  Left Ventricle: Left ventricular ejection fraction, by estimation, is 55 to 60%. The left ventricle has normal function. The left  ventricle has no regional wall motion abnormalities. The left ventricular internal cavity size was normal in size. There is  no left ventricular hypertrophy. Right Ventricle: The right ventricular size is normal. No increase in right ventricular wall thickness. Right ventricular systolic function is normal. There is normal pulmonary artery systolic pressure. The tricuspid regurgitant velocity is 2.78 m/s, and  with an assumed right atrial pressure of 3 mmHg, the estimated right ventricular systolic pressure is 22.0 mmHg. Pericardium: There is no evidence of pericardial effusion. Mitral Valve: The mitral valve is normal in structure. Trivial mitral valve regurgitation. No evidence of mitral valve stenosis. Tricuspid Valve: The tricuspid valve is normal in structure. Tricuspid valve regurgitation is trivial. Aortic  Valve: The aortic valve was not well visualized. Aortic valve regurgitation is not visualized. No aortic stenosis is present. Pulmonic Valve: The pulmonic valve was not well visualized. Pulmonic valve regurgitation is not visualized. Aorta: The aortic root is normal in size and structure. Venous: The inferior vena cava is normal in size with greater than 50% respiratory variability, suggesting right atrial pressure of 3 mmHg. LEFT VENTRICLE PLAX 2D LVIDd:         3.50 cm   Diastology LVIDs:         2.60 cm   LV e' medial:    8.59 cm/s LV PW:         0.80 cm   LV E/e' medial:  10.1 LV IVS:        0.80 cm   LV e' lateral:   6.31 cm/s LVOT diam:     1.70 cm   LV E/e' lateral: 13.8 LV SV:         32 LV SV Index:   19 LVOT Area:     2.27 cm  RIGHT VENTRICLE RV S prime:     8.67 cm/s TAPSE (M-mode): 1.4 cm LEFT ATRIUM         Index LA diam:    2.50 cm 1.52 cm/m  AORTIC VALVE LVOT Vmax:   80.60 cm/s LVOT Vmean:  49.700 cm/s LVOT VTI:    0.141 m  AORTA Ao Root diam: 2.80 cm MITRAL VALVE               TRICUSPID VALVE MV Area (PHT): 5.02 cm    TR Peak grad:   30.9 mmHg MV Decel Time: 151 msec    TR Vmax:         278.00 cm/s MV E velocity: 87.00 cm/s MV A velocity: 56.60 cm/s  SHUNTS MV E/A ratio:  1.54        Systemic VTI:  0.14 m                            Systemic Diam: 1.70 cm Oswaldo Milian MD Electronically signed by Oswaldo Milian MD Signature Date/Time: 08/26/2021/4:29:54 PM    Final    US THORACENTESIS ASP PLEURAL SPACE W/IMG GUIDE  Result Date: 08/26/2021 INDICATION: Patient with history of stage IV non-small cell lung cancer, left pleural effusion; request received for diagnostic and therapeutic left thoracentesis. EXAM: ULTRASOUND GUIDED DIAGNOSTIC AND THERAPEUTIC LEFT THORACENTESIS MEDICATIONS: 10 mL 1% lidocaine COMPLICATIONS: None immediate. PROCEDURE: An ultrasound guided thoracentesis was thoroughly discussed with the patient and questions answered. The benefits, risks, alternatives and complications were also discussed. The patient understands and wishes to proceed with the procedure. Written consent was obtained. Ultrasound was performed to localize and mark an adequate pocket of fluid in the left chest. The area was then prepped and draped in the normal sterile fashion. 1% Lidocaine was used for local anesthesia. Under ultrasound guidance a 6 Fr Safe-T-Centesis catheter was introduced. Thoracentesis was performed. The catheter was removed and a dressing applied. FINDINGS: A total of approximately 320 cc of yellow fluid was removed. Samples were sent to the laboratory as requested by the clinical team. IMPRESSION: Successful ultrasound guided diagnostic and therapeutic left thoracentesis yielding 320 cc of pleural fluid. Read by: Rowe Robert, PA-C Electronically Signed   By: Miachel Roux M.D.   On: 08/26/2021 16:19        Scheduled Meds:  atorvastatin  40 mg Oral Daily   enoxaparin (LOVENOX)  injection  50 mg Subcutaneous BID   feeding supplement  237 mL Oral TID BM   fentaNYL  1 patch Transdermal M35T   folic acid  1 mg Oral Daily   insulin aspart  0-9 Units Subcutaneous TID  WC   meclizine  25 mg Oral BID   metoprolol succinate  25 mg Oral Daily   multivitamin with minerals  1 tablet Oral QODAY   nystatin  5 mL Oral QID   polyethylene glycol  17 g Oral Daily   potassium chloride  40 mEq Oral BID   senna-docusate  1 tablet Oral BID   sertraline  50 mg Oral Daily   Zinc Oxide   Topical TID   Continuous Infusions:  ceFEPime (MAXIPIME) IV 2 g (08/27/21 1402)     LOS: 1 day   Time spent: 63mins Greater than 50% of this time was spent in counseling, explanation of diagnosis, planning of further management, and coordination of care.   Voice Recognition Viviann Spare dictation system was used to create this note, attempts have been made to correct errors. Please contact the author with questions and/or clarifications.   Florencia Reasons, MD PhD FACP [facility]  Available via Epic secure chat 7am-7pm for nonurgent issues Please page for urgent issues To page the attending provider between 7A-7P or the covering provider during after hours 7P-7A, please log into the web site [email or web address] and access using universal [place] password for that web site. If you do not have the password, please call the hospital operator.    08/27/2021, 3:35 PM

## 2021-08-27 NOTE — Evaluation (Addendum)
Physical Therapy Evaluation Patient Details Name: Kim Ross MRN: [id number] DOB: Jun 17, 1956 Today's Date: 08/27/2021  History of Present Illness  65yo female who presented on 11/22 after having palpitations while at [facility] center; found to be in Afib with RVR and transferred to ED. PE negative but did have pleural effusion, addressed with thoracentesis on 11/23. 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 Tells me she has mostly been limited to transfers at home due to pain, can get in/out of house when she needs to but this is limited by pain as well. Able to get to EOB well, then required Min-ModA to transfer to recliner today. VSS with low level activity on room air today. Left up in recliner positioned to comfort with all needs met this morning. Would like to return home when medically ready- since she has 24/7 assist from family, I think this is reasonable with DME as below. Will continue to follow while she remains in house.        Recommendations for follow up therapy are one component of a multi-disciplinary discharge planning process, led by the attending physician.  Recommendations may be updated based on patient status, additional functional criteria and insurance authorization.  Follow Up Recommendations Home health PT    Assistance Recommended at Discharge Frequent or constant Supervision/Assistance  Functional Status Assessment Patient has had a recent decline in their functional status and/or demonstrates limited ability to make significant improvements in function in a reasonable and predictable amount of time  Equipment Recommendations  Wheelchair; wheelchair cushion   Recommendations for Other Services       Precautions / Restrictions Precautions Precautions: Fall Precaution Comments: high fracture risk due CA, L LE pain, watch vitals Restrictions Weight Bearing  Restrictions: No      Mobility  Bed Mobility Overal bed mobility: Needs Assistance Bed Mobility: Supine to Sit     Supine to sit: Supervision;HOB elevated     General bed mobility comments: increased time and effort    Transfers Overall transfer level: Needs assistance Equipment used: Rolling walker (2 wheels) Transfers: Sit to/from Stand;Bed to chair/wheelchair/BSC Sit to Stand: Mod assist Stand pivot transfers: Min assist         General transfer comment: ModA to boost to upright standing with cues for safety, MinA for balance/RW management with pivot to recliner    Ambulation/Gait               General Gait Details: unable- L LE pain  Stairs            Wheelchair Mobility    Modified Rankin (Stroke Patients Only)       Balance Overall balance assessment: Needs assistance Sitting-balance support: No upper extremity supported;Feet supported Sitting balance-Leahy Scale: Good     Standing balance support: Bilateral upper extremity supported;During functional activity;Reliant on assistive device for balance Standing balance-Leahy Scale: Poor                               Pertinent Vitals/Pain Pain Assessment: 0-10 Pain Score: 8  Pain Location: L LE Pain Descriptors / Indicators: Sharp;Dull;Tightness Pain Intervention(s): Limited activity within patient's tolerance;Monitored during session;Patient requesting pain meds-RN notified    Home Living Family/patient expects to be discharged to:: Private residence Living Arrangements: Spouse/significant other;Children (fiancee and daughter) Available Help at Discharge: Family;Available 24 hours/day  Type of Home: House Home Access: Stairs to enter Entrance Stairs-Rails: Can reach both Entrance Stairs-Number of Steps: 8 STE in the front; can get in the back by going up the driveway with 0 STE at this enterance   Home Layout: One level Home Equipment: Rollator (4 wheels);Cane - quad;Other  (comment) (bedpan) Additional Comments: uses walker more due to L LE pain; no falls recently, no close calls    Prior Function Prior Level of Function : Needs assist       Physical Assist : Mobility (physical) Mobility (physical): Transfers   Mobility Comments: mostly just getting OOB/transferring to/from chair; familiy pushes her to try to walk but she is very anxious secondary to LE pain. uses rollator almost as a WC seat ADLs Comments: family helps with bathing/dressing     Hand Dominance        Extremity/Trunk Assessment   Upper Extremity Assessment Upper Extremity Assessment: Defer to OT evaluation    Lower Extremity Assessment Lower Extremity Assessment: Generalized weakness    Cervical / Trunk Assessment Cervical / Trunk Assessment: Normal  Communication   Communication: No difficulties  Cognition Arousal/Alertness: Awake/alert Behavior During Therapy: WFL for tasks assessed/performed Overall Cognitive Status: Within Functional Limits for tasks assessed                                          General Comments General comments (skin integrity, edema, etc.): VSS for transfers    Exercises     Assessment/Plan    PT Assessment Patient needs continued PT services  PT Problem List Decreased strength;Decreased mobility;Decreased safety awareness;Decreased knowledge of precautions;Decreased activity tolerance;Decreased balance;Decreased knowledge of use of DME;Pain       PT Treatment Interventions DME instruction;Therapeutic activities;Gait training;Therapeutic exercise;Patient/family education;Stair training;Wheelchair mobility training;Balance training;Functional mobility training    PT Goals (Current goals can be found in the Care Plan section)  Acute Rehab PT Goals Patient Stated Goal: go home PT Goal Formulation: With patient Time For Goal Achievement: 09/10/21 Potential to Achieve Goals: Good    Frequency Min 3X/week   Barriers to  discharge        Co-evaluation               AM-PAC PT "6 Clicks" Mobility  Outcome Measure Help needed turning from your back to your side while in a flat bed without using bedrails?: None Help needed moving from lying on your back to sitting on the side of a flat bed without using bedrails?: A Little Help needed moving to and from a bed to a chair (including a wheelchair)?: A Lot Help needed standing up from a chair using your arms (e.g., wheelchair or bedside chair)?: A Lot Help needed to walk in hospital room?: A Lot Help needed climbing 3-5 steps with a railing? : Total 6 Click Score: 14    End of Session Equipment Utilized During Treatment: Gait belt Activity Tolerance: Patient tolerated treatment well;Patient limited by pain Patient left: in chair;with call bell/phone within reach Nurse Communication: Mobility status PT Visit Diagnosis: Unsteadiness on feet (R26.81);Muscle weakness (generalized) (M62.81);Difficulty in walking, not elsewhere classified (R26.2);Pain Pain - Right/Left: Left Pain - part of body: Leg    Time: 0263-7858 PT Time Calculation (min) (ACUTE ONLY): 28 min   Charges:   PT Evaluation $PT Eval Moderate Complexity: 1 Mod PT Treatments $Therapeutic Activity: 8-22 mins  Windell Norfolk, DPT, PN2   Supplemental Physical Therapist [place]    Pager [phone] Acute Rehab Office [phone]

## 2021-08-27 NOTE — Consult Note (Signed)
Hospital Consult    Reason for Consult: Lower extremity swelling Requesting Physician:  Florencia Reasons MD MRN #:  [id number]  History of Present Illness: This is a 65 y.o. female who presented to [place] [facility] 2 days ago with a 1 month history of swelling in the left leg.  Contina has been undergoing chemotherapy for lung cancer, with brain metastasis.  Per her husband, she has been very tired afterwards and over the last 2 months has been relatively nonambulatory.  She has required assistance standing, and getting to the restroom.  When they first appreciated swelling roughly 1 month ago, her husband placed a compression stocking on which helped with some of the edema..  Over the last week, the pain has worsened, prompting her to seek treatment in the ED.  On exam, Blenda was resting comfortably.  She denied sensorimotor changes in the left foot.  She continues treatment for stage IV lung cancer, however palliative care was recently consulted.  Patient currently DNR.  Past Medical History:  Diagnosis Date   Acute pain of left shoulder 12/01/2020   Anxiety    Aspiration pneumonitis ([place]) 08/05/2019   Breast cancer screening 12/30/2017   Chest wall pain 11/05/2019   Cocaine abuse ([place])    Depression    Drug intoxication ([place]) 08/05/2019   Encounter for screening for HIV 08/05/2019   Goiter 09/05/2019   History of recurrent UTIs    No-show for appointment 09/24/2020   Pneumonia    Pre-diabetes    Urinary frequency 04/10/2013    Past Surgical History:  Procedure Laterality Date   ABDOMINAL HYSTERECTOMY     BRONCHIAL NEEDLE ASPIRATION BIOPSY  06/17/2021   Procedure: BRONCHIAL NEEDLE ASPIRATION BIOPSIES;  Surgeon: Maryjane Hurter, MD;  Location: [facility];  Service: Pulmonary;;   ENDOBRONCHIAL ULTRASOUND Left 06/17/2021   Procedure: ENDOBRONCHIAL ULTRASOUND;  Surgeon: Maryjane Hurter, MD;  Location: [facility];  Service: Pulmonary;  Laterality: Left;   HEMORRHOID SURGERY     VIDEO  BRONCHOSCOPY  06/17/2021   Procedure: VIDEO BRONCHOSCOPY;  Surgeon: Maryjane Hurter, MD;  Location: [facility];  Service: Pulmonary;;    Allergies  Allergen Reactions   Ibuprofen Nausea And Vomiting   Trazodone And Nefazodone     AM Dizziness & Drowsiness     Prior to Admission medications   Medication Sig Start Date End Date Taking? Authorizing Provider  atorvastatin (LIPITOR) 40 MG tablet TAKE 1 TABLET BY MOUTH EVERY DAY Patient taking differently: Take 40 mg by mouth daily. 02/03/21  Yes Milus Banister C, DO  feeding supplement (ENSURE ENLIVE / ENSURE PLUS) LIQD Take 237 mLs by mouth 2 (two) times daily between meals. 06/18/21  Yes Mariel Aloe, MD  fluticasone (FLONASE) 50 MCG/ACT nasal spray Place 1 spray into both nostrils daily. 1 spray in each nostril every day 04/29/21  Yes Gladys Damme, MD  meclizine (ANTIVERT) 25 MG tablet TAKE 1 TABLET BY MOUTH TWICE A DAY 10/09/20  Yes Milus Banister C, DO  metFORMIN (GLUCOPHAGE) 500 MG tablet TAKE 1 TABLET BY MOUTH 2 TIMES DAILY WITH A MEAL. Patient taking differently: Take 250 mg by mouth daily with breakfast. 05/20/21  Yes Erskine Emery, MD  Multiple Vitamins-Minerals (MULTIVITAMIN WITH MINERALS) tablet Take 1 tablet by mouth daily. Patient taking differently: Take 1 tablet by mouth every other day. 04/27/13  Yes Olam Idler, MD  prochlorperazine (COMPAZINE) 10 MG tablet Take 1 tablet (10 mg total) by mouth every 6 (six) hours as needed. 07/08/21  Yes  Heilingoetter, Cassandra L, PA-C  sertraline (ZOLOFT) 50 MG tablet TAKE 1 TABLET BY MOUTH EVERY DAY Patient taking differently: Take 50 mg by mouth daily. 11/30/20  Yes Milus Banister C, DO  triamcinolone ointment (KENALOG) 0.5 % APPLY 1 APPLICATION TOPICALLY 2 (TWO) TIMES DAILY. FOR MODERATE TO SEVERE ECZEMA. DO NOT USE FOR MORE THAN 1 WEEK AT A TIME. Patient taking differently: 1 application See admin instructions. Apply twice daily for moderate to severe eczema.  Do not use for  more than 1 week at a time. 08/10/21  Yes Eppie Gibson, MD  fentaNYL (DURAGESIC) 50 MCG/HR Place 1 patch onto the skin every 3 (three) days. 08/26/21   Curt Bears, MD  fluconazole (DIFLUCAN) 100 MG tablet Take 2 tablets today, then 1 tablet daily x 13 more days. Hold atorvastatin while on this medication. 08/26/21   Curt Bears, MD  folic acid (FOLVITE) 1 MG tablet Take 1 tablet (1 mg total) by mouth daily. 08/26/21   Curt Bears, MD  HYDROcodone-acetaminophen (NORCO/VICODIN) 5-325 MG tablet Take 1 tablet by mouth every 6 (six) hours as needed for moderate pain. 08/26/21   Curt Bears, MD    Social History   Socioeconomic History   Marital status: Single    Spouse name: Not on file   Number of children: Not on file   Years of education: Not on file   Highest education level: Not on file  Occupational History   Not on file  Tobacco Use   Smoking status: Some Days    Packs/day: 0.25    Types: Cigarettes   Smokeless tobacco: Never  Vaping Use   Vaping Use: Never used  Substance and Sexual Activity   Alcohol use: Not Currently    Alcohol/week: 1.0 standard drink    Types: 1 Cans of beer per week   Drug use: No   Sexual activity: Never  Other Topics Concern   Not on file  Social History Narrative   Not on file   Social Determinants of Health   Financial Resource Strain: High Risk   Difficulty of Paying Living Expenses: Very hard  Food Insecurity: Not on file  Transportation Needs: Not on file  Physical Activity: Not on file  Stress: Not on file  Social Connections: Not on file  Intimate Partner Violence: Not on file     Family History  Problem Relation Age of Onset   Asthma Mother    Cancer Sister 44       Pancreatic    ROS: Otherwise negative unless mentioned in HPI  Physical Examination  Vitals:   08/27/21 0521 08/27/21 1240  BP: 114/72 106/65  Pulse: 73 72  Resp: 20 15  Temp: 97.8 F (36.6 C) (!) 97.5 F (36.4 C)  SpO2: 100% 99%    Body mass index is 19.41 kg/m.  General:  WDWN in NAD Gait: Not observed HENT: WNL, normocephalic Pulmonary: normal non-labored breathing, without Rales, rhonchi,  wheezing Cardiac: regular, Abdomen:  soft, NT/ND, no masses Skin: without rashes Vascular Exam/Pulses: 2+ dorsalis pedis bilaterally Extremities: without ischemic changes, without Gangrene , without cellulitis; without open wounds;  Musculoskeletal: no muscle wasting or atrophy  Neurologic: A&O X 3;  No focal weakness or paresthesias are detected; speech is fluent/normal Psychiatric:  The pt has Normal affect. Lymph:  Unremarkable  CBC    Component Value Date/Time   WBC 12.1 (H) 08/26/2021 2015   RBC 3.55 (L) 08/26/2021 2015   HGB 10.2 (L) 08/26/2021 2015   HGB  10.3 (L) 08/25/2021 1149   HGB 13.7 04/29/2021 0931   HCT 31.8 (L) 08/26/2021 2015   HCT 42.4 04/29/2021 0931   PLT 225 08/26/2021 2015   PLT 208 08/25/2021 1149   PLT 414 04/29/2021 0931   MCV 89.6 08/26/2021 2015   MCV 88 04/29/2021 0931   MCH 28.7 08/26/2021 2015   MCHC 32.1 08/26/2021 2015   RDW 18.9 (H) 08/26/2021 2015   RDW 13.5 04/29/2021 0931   LYMPHSABS 0.4 (L) 08/26/2021 2015   LYMPHSABS 2.6 04/29/2021 0931   MONOABS 0.8 08/26/2021 2015   EOSABS 0.0 08/26/2021 2015   EOSABS 0.2 04/29/2021 0931   BASOSABS 0.0 08/26/2021 2015   BASOSABS 0.1 04/29/2021 0931    BMET    Component Value Date/Time   NA 133 (L) 08/27/2021 0400   NA 139 04/29/2021 0931   K 3.4 (L) 08/27/2021 0400   CL 100 08/27/2021 0400   CO2 23 08/27/2021 0400   GLUCOSE 91 08/27/2021 0400   BUN 18 08/27/2021 0400   BUN 13 04/29/2021 0931   CREATININE 0.41 (L) 08/27/2021 0400   CREATININE 0.53 08/25/2021 1149   CREATININE 0.81 09/30/2016 1651   CALCIUM 8.0 (L) 08/27/2021 0400   GFRNONAA >60 08/27/2021 0400   GFRNONAA >60 08/25/2021 1149   GFRNONAA 80 09/30/2016 1651   GFRAA 76 11/28/2020 1526   GFRAA >89 09/30/2016 1651    COAGS: Lab Results  Component Value  Date   INR 1.3 (H) 08/25/2021     Non-Invasive Vascular Imaging:   Patient underwent left lower extremity venous ultrasonography, IVC ultrasonography. These were independently interpreted restraining acute occlusion of the common femoral vein through the tibial veins. Follow-up the study, the patient underwent CT venogram which demonstrated absent left common iliac vein, external iliac vein.  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  Statin:  Yes.   Beta Blocker:  No. Aspirin:  No. ACEI:  No. ARB:  No. CCB use:  No Other antiplatelets/anticoagulants:  Yes.   lovenox   ASSESSMENT/PLAN: This is a 65 y.o. female who presented with a 1 month history of left lower extremity swelling, and imaging demonstrating left-sided deep venous thrombosis from the common femoral vein through the tibial vessels.  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  This atresia usually happens with a previous history of chronic occlusion, but can be from iatrogenic injury at birth or congenital.  Regardless, the patient does not have in-line venous flow to the inferior vena cava.  Therefore, there is no therapeutic intervention I can offer in an effort to decrease her thrombotic burden in the left leg.  Patient would benefit from continued anticoagulation, compression stockings 20 to 30 mmHg, thigh-high, worn daily.  Anticoagulation should be lifelong as this DVT is provoked from malignancy.   Cassandria Santee MD MS [facility] [phone] 08/27/2021  6:25 PM

## 2021-08-28 ENCOUNTER — Inpatient Hospital Stay (HOSPITAL_COMMUNITY): Payer: Medicare Other

## 2021-08-28 DIAGNOSIS — I4891 Unspecified atrial fibrillation: Secondary | ICD-10-CM | POA: Diagnosis not present

## 2021-08-28 DIAGNOSIS — C3492 Malignant neoplasm of unspecified part of left bronchus or lung: Secondary | ICD-10-CM | POA: Diagnosis not present

## 2021-08-28 DIAGNOSIS — R52 Pain, unspecified: Secondary | ICD-10-CM | POA: Diagnosis not present

## 2021-08-28 DIAGNOSIS — E43 Unspecified severe protein-calorie malnutrition: Secondary | ICD-10-CM | POA: Diagnosis not present

## 2021-08-28 LAB — CBC WITH DIFFERENTIAL/PLATELET
Abs Immature Granulocytes: 0.03 10*3/uL (ref 0.00–0.07)
Basophils Absolute: 0 10*3/uL (ref 0.0–0.1)
Basophils Relative: 0 %
Eosinophils Absolute: 0 10*3/uL (ref 0.0–0.5)
Eosinophils Relative: 1 %
HCT: 42 % (ref 36.0–46.0)
Hemoglobin: 13.7 g/dL (ref 12.0–15.0)
Immature Granulocytes: 0 %
Lymphocytes Relative: 3 %
Lymphs Abs: 0.2 10*3/uL — ABNORMAL LOW (ref 0.7–4.0)
MCH: 28.5 pg (ref 26.0–34.0)
MCHC: 32.6 g/dL (ref 30.0–36.0)
MCV: 87.5 fL (ref 80.0–100.0)
Monocytes Absolute: 0.1 10*3/uL (ref 0.1–1.0)
Monocytes Relative: 1 %
Neutro Abs: 7.6 10*3/uL (ref 1.7–7.7)
Neutrophils Relative %: 95 %
Platelets: 153 10*3/uL (ref 150–400)
RBC: 4.8 MIL/uL (ref 3.87–5.11)
RDW: 19.6 % — ABNORMAL HIGH (ref 11.5–15.5)
WBC: 8 10*3/uL (ref 4.0–10.5)
nRBC: 0 % (ref 0.0–0.2)

## 2021-08-28 LAB — GLUCOSE, CAPILLARY
Glucose-Capillary: 88 mg/dL (ref 70–99)
Glucose-Capillary: 109 mg/dL — ABNORMAL HIGH (ref 70–99)
Glucose-Capillary: 74 mg/dL (ref 70–99)
Glucose-Capillary: 113 mg/dL — ABNORMAL HIGH (ref 70–99)

## 2021-08-28 LAB — BASIC METABOLIC PANEL
Anion gap: 8 (ref 5–15)
BUN: 17 mg/dL (ref 8–23)
CO2: 25 mmol/L (ref 22–32)
Calcium: 8.1 mg/dL — ABNORMAL LOW (ref 8.9–10.3)
Chloride: 101 mmol/L (ref 98–111)
Creatinine, Ser: 0.35 mg/dL — ABNORMAL LOW (ref 0.44–1.00)
GFR, Estimated: >60 mL/min (ref >60)
Glucose, Bld: 90 mg/dL (ref 70–99)
Potassium: 3.3 mmol/L — ABNORMAL LOW (ref 3.5–5.1)
Sodium: 134 mmol/L — ABNORMAL LOW (ref 135–145)

## 2021-08-28 LAB — MAGNESIUM: Magnesium: 1.7 mg/dL (ref 1.7–2.4)

## 2021-08-28 LAB — MRSA NEXT GEN BY PCR, NASAL: MRSA by PCR Next Gen: NOT DETECTED

## 2021-08-28 MED ORDER — ORAL CARE MOUTH RINSE
15.0000 mL | Freq: Two times a day (BID) | OROMUCOSAL | Status: DC
  Administered 2021-08-28 – 2021-09-28 (×56): 15 mL via OROMUCOSAL

## 2021-08-28 MED ORDER — SODIUM CHLORIDE 0.9 % IV BOLUS
1000.0000 mL | Freq: Once | INTRAVENOUS | Status: AC
  Administered 2021-08-28: 1000 mL via INTRAVENOUS

## 2021-08-28 MED ORDER — CHLORHEXIDINE GLUCONATE CLOTH 2 % EX PADS
6.0000 | MEDICATED_PAD | Freq: Every day | CUTANEOUS | Status: DC
  Administered 2021-08-29 – 2021-09-02 (×5): 6 via TOPICAL

## 2021-08-28 MED ORDER — ZINC OXIDE 20 % EX OINT
TOPICAL_OINTMENT | Freq: Three times a day (TID) | CUTANEOUS | Status: DC
  Administered 2021-08-31 – 2021-09-20 (×10): 1 via TOPICAL
  Filled 2021-08-28 (×3): qty 28.35

## 2021-08-28 MED ORDER — MAGNESIUM SULFATE 2 GM/50ML IV SOLN
2.0000 g | Freq: Once | INTRAVENOUS | Status: AC
  Administered 2021-08-28: 2 g via INTRAVENOUS
  Filled 2021-08-28: qty 50

## 2021-08-28 MED ORDER — METOPROLOL TARTRATE 12.5 MG HALF TABLET
12.5000 mg | ORAL_TABLET | Freq: Two times a day (BID) | ORAL | Status: DC
  Administered 2021-08-29: 12.5 mg via ORAL
  Filled 2021-08-28 (×2): qty 1

## 2021-08-28 MED ORDER — AMIODARONE LOAD VIA INFUSION
150.0000 mg | Freq: Once | INTRAVENOUS | Status: AC
  Administered 2021-08-28: 150 mg via INTRAVENOUS
  Filled 2021-08-28: qty 83.34

## 2021-08-28 MED ORDER — METOPROLOL TARTRATE 5 MG/5ML IV SOLN
5.0000 mg | Freq: Once | INTRAVENOUS | Status: AC
  Administered 2021-08-28: 5 mg via INTRAVENOUS

## 2021-08-28 MED ORDER — LACTULOSE 10 GM/15ML PO SOLN: 10.0000 g | Freq: Every day | ORAL | Status: DC | PRN

## 2021-08-28 MED ORDER — AMIODARONE HCL IN DEXTROSE 360-4.14 MG/200ML-% IV SOLN
30.0000 mg/h | INTRAVENOUS | Status: DC
  Administered 2021-08-29 – 2021-09-02 (×9): 30 mg/h via INTRAVENOUS
  Filled 2021-08-28 (×11): qty 200

## 2021-08-28 MED ORDER — ALBUMIN HUMAN 5 % IV SOLN
12.5000 g | Freq: Once | INTRAVENOUS | Status: AC
  Administered 2021-08-29: 12.5 g via INTRAVENOUS
  Filled 2021-08-28: qty 250

## 2021-08-28 MED ORDER — FLUCONAZOLE 100MG IVPB
100.0000 mg | INTRAVENOUS | Status: DC
  Administered 2021-08-28: 100 mg via INTRAVENOUS
  Filled 2021-08-28 (×2): qty 50

## 2021-08-28 MED ORDER — MIDODRINE HCL 5 MG PO TABS
2.5000 mg | ORAL_TABLET | Freq: Three times a day (TID) | ORAL | Status: DC
Start: 2021-08-28 — End: 2021-09-14
  Administered 2021-08-29 – 2021-09-13 (×41): 2.5 mg via ORAL
  Filled 2021-08-28 (×44): qty 1

## 2021-08-28 MED ORDER — MIRTAZAPINE 15 MG PO TABS
7.5000 mg | ORAL_TABLET | Freq: Every day | ORAL | Status: DC
Start: 2021-08-28 — End: 2021-08-29

## 2021-08-28 MED ORDER — METOPROLOL TARTRATE 5 MG/5ML IV SOLN
INTRAVENOUS | Status: AC
  Filled 2021-08-28: qty 5

## 2021-08-28 MED ORDER — ONDANSETRON HCL 4 MG/2ML IJ SOLN
4.0000 mg | Freq: Four times a day (QID) | INTRAMUSCULAR | Status: DC | PRN
  Administered 2021-09-01 – 2021-09-25 (×6): 4 mg via INTRAVENOUS
  Filled 2021-08-28 (×7): qty 2

## 2021-08-28 MED ORDER — AMIODARONE HCL IN DEXTROSE 360-4.14 MG/200ML-% IV SOLN
60.0000 mg/h | INTRAVENOUS | Status: AC
  Administered 2021-08-28 (×2): 60 mg/h via INTRAVENOUS
  Filled 2021-08-28: qty 200

## 2021-08-28 MED ORDER — FLUCONAZOLE 100 MG PO TABS
100.0000 mg | ORAL_TABLET | Freq: Every day | ORAL | Status: DC
  Filled 2021-08-28: qty 1

## 2021-08-28 MED ORDER — POTASSIUM CHLORIDE 20 MEQ PO PACK
40.0000 meq | PACK | Freq: Two times a day (BID) | ORAL | Status: DC
  Administered 2021-08-28 – 2021-08-29 (×2): 40 meq via ORAL
  Filled 2021-08-28 (×2): qty 2

## 2021-08-28 MED ORDER — COSYNTROPIN 0.25 MG IJ SOLR
0.2500 mg | Freq: Once | INTRAMUSCULAR | Status: AC
  Administered 2021-08-29: 0.25 mg via INTRAVENOUS
  Filled 2021-08-28 (×2): qty 0.25

## 2021-08-28 MED ORDER — LACTATED RINGERS IV SOLN
INTRAVENOUS | Status: DC
Start: 2021-08-28 — End: 2021-08-28

## 2021-08-28 MED ORDER — DEXTROSE IN LACTATED RINGERS 5 % IV SOLN
INTRAVENOUS | Status: DC
Start: 2021-08-28 — End: 2021-08-30

## 2021-08-28 NOTE — Progress Notes (Addendum)
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                    No                   Acute          +---------+---------------+---------+-----------+----------+--------------+ POP      None                    No                   Acute          +---------+---------------+---------+-----------+----------+--------------+ PTV      None                    No                   Acute          +---------+---------------+---------+-----------+----------+--------------+ PERO                             No                   Acute          +---------+---------------+---------+-----------+----------+--------------+ left peroneal veins only able to be evaluated in longitudinal  plane.    Summary: RIGHT: - No evidence of common femoral vein obstruction.  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 - No cystic structure found in the popliteal fossa.  *See table(s) above for measurements and observations. Electronically signed by Harold Barban MD on 08/27/2021 at 12:53:01 AM.    Final    ECHOCARDIOGRAM LIMITED  Result Date: 08/26/2021    ECHOCARDIOGRAM LIMITED REPORT   Patient Name:   Kim Ross Date of Exam: 08/26/2021 Medical Rec #:  [id number]      Height:       65.0 in Accession #:    [id number]     Weight:       130.0 lb Date of Birth:  June 23, 1956       BSA:          1.647 m Patient Age:    65 years       BP:           93/69 mmHg Patient Gender: F              HR:  84 bpm. Exam Location:  Inpatient Procedure: Limited Echo, Limited Color Doppler and Cardiac Doppler Indications:    I48.91* Unspeicified atrial fibrillation  History:        Patient has prior history of Echocardiogram examinations, most                 recent 05/05/2021.  Sonographer:    Bernadene Person RDCS Referring Phys: [place]  1. Left ventricular ejection fraction, by estimation, is 55 to 60%. The left ventricle has normal function. The left ventricle has no regional wall motion abnormalities.  2. Right ventricular systolic function is normal. The right ventricular size is normal. There is normal pulmonary artery systolic pressure. The estimated right ventricular systolic pressure is 85.0 mmHg.  3. The mitral valve is normal in structure. Trivial mitral valve regurgitation. No evidence of mitral stenosis.  4. The aortic valve was not well visualized. Aortic valve regurgitation is not visualized. No aortic stenosis is present.  5. The inferior vena cava is normal in size with greater than  50% respiratory variability, suggesting right atrial pressure of 3 mmHg. FINDINGS  Left Ventricle: Left ventricular ejection fraction, by estimation, is 55 to 60%. The left ventricle has normal function. The left ventricle has no regional wall motion abnormalities. The left ventricular internal cavity size was normal in size. There is  no left ventricular hypertrophy. Right Ventricle: The right ventricular size is normal. No increase in right ventricular wall thickness. Right ventricular systolic function is normal. There is normal pulmonary artery systolic pressure. The tricuspid regurgitant velocity is 2.78 m/s, and  with an assumed right atrial pressure of 3 mmHg, the estimated right ventricular systolic pressure is 27.7 mmHg. Pericardium: There is no evidence of pericardial effusion. Mitral Valve: The mitral valve is normal in structure. Trivial mitral valve regurgitation. No evidence of mitral valve stenosis. Tricuspid Valve: The tricuspid valve is normal in structure. Tricuspid valve regurgitation is trivial. Aortic Valve: The aortic valve was not well visualized. Aortic valve regurgitation is not visualized. No aortic stenosis is present. Pulmonic Valve: The pulmonic valve was not well visualized. Pulmonic valve regurgitation is not visualized. Aorta: The aortic root is normal in size and structure. Venous: The inferior vena cava is normal in size with greater than 50% respiratory variability, suggesting right atrial pressure of 3 mmHg. LEFT VENTRICLE PLAX 2D LVIDd:         3.50 cm   Diastology LVIDs:         2.60 cm   LV e' medial:    8.59 cm/s LV PW:         0.80 cm   LV E/e' medial:  10.1 LV IVS:        0.80 cm   LV e' lateral:   6.31 cm/s LVOT diam:     1.70 cm   LV E/e' lateral: 13.8 LV SV:         32 LV SV Index:   19 LVOT Area:     2.27 cm  RIGHT VENTRICLE RV S prime:     8.67 cm/s TAPSE (M-mode): 1.4 cm LEFT ATRIUM         Index LA diam:    2.50 cm 1.52 cm/m  AORTIC VALVE LVOT Vmax:   80.60 cm/s  LVOT Vmean:  49.700 cm/s LVOT VTI:    0.141 m  AORTA Ao Root diam: 2.80 cm MITRAL VALVE               TRICUSPID  VALVE MV Area (PHT): 5.02 cm    TR Peak grad:   30.9 mmHg MV Decel Time: 151 msec    TR Vmax:        278.00 cm/s MV E velocity: 87.00 cm/s MV A velocity: 56.60 cm/s  SHUNTS MV E/A ratio:  1.54        Systemic VTI:  0.14 m                            Systemic Diam: 1.70 cm Oswaldo Milian MD Electronically signed by Oswaldo Milian MD Signature Date/Time: 08/26/2021/4:29:54 PM    Final    US THORACENTESIS ASP PLEURAL SPACE W/IMG GUIDE  Result Date: 08/26/2021 INDICATION: Patient with history of stage IV non-small cell lung cancer, left pleural effusion; request received for diagnostic and therapeutic left thoracentesis. EXAM: ULTRASOUND GUIDED DIAGNOSTIC AND THERAPEUTIC LEFT THORACENTESIS MEDICATIONS: 10 mL 1% lidocaine COMPLICATIONS: None immediate. PROCEDURE: An ultrasound guided thoracentesis was thoroughly discussed with the patient and questions answered. The benefits, risks, alternatives and complications were also discussed. The patient understands and wishes to proceed with the procedure. Written consent was obtained. Ultrasound was performed to localize and mark an adequate pocket of fluid in the left chest. The area was then prepped and draped in the normal sterile fashion. 1% Lidocaine was used for local anesthesia. Under ultrasound guidance a 6 Fr Safe-T-Centesis catheter was introduced. Thoracentesis was performed. The catheter was removed and a dressing applied. FINDINGS: A total of approximately 320 cc of yellow fluid was removed. Samples were sent to the laboratory as requested by the clinical team. IMPRESSION: Successful ultrasound guided diagnostic and therapeutic left thoracentesis yielding 320 cc of pleural fluid. Read by: Rowe Robert, PA-C Electronically Signed   By: Miachel Roux M.D.   On: 08/26/2021 16:19        Scheduled Meds:  amoxicillin-clavulanate  1 tablet  Oral BID   atorvastatin  40 mg Oral Daily   Chlorhexidine Gluconate Cloth  6 each Topical Daily   [START ON 08/29/2021] cosyntropin  0.25 mg Intravenous Once   enoxaparin (LOVENOX) injection  50 mg Subcutaneous BID   feeding supplement  237 mL Oral TID BM   feeding supplement (GLUCERNA SHAKE)  237 mL Oral TID BM   fentaNYL  1 patch Transdermal B16X   folic acid  1 mg Oral Daily   insulin aspart  0-9 Units Subcutaneous TID WC   meclizine  25 mg Oral BID   mouth rinse  15 mL Mouth Rinse BID   metoprolol tartrate  12.5 mg Oral BID   multivitamin with minerals  1 tablet Oral QODAY   nystatin  5 mL Oral QID   polyethylene glycol  17 g Oral BID   potassium chloride  40 mEq Oral BID   senna-docusate  1 tablet Oral BID   sertraline  50 mg Oral Daily   tamsulosin  0.4 mg Oral QPC supper   Zinc Oxide   Topical TID   Continuous Infusions:  lactated ringers       LOS: 2 days   Time spent: 10mins Greater than 50% of this time was spent in counseling, explanation of diagnosis, planning of further management, and coordination of care.   Voice Recognition Viviann Spare dictation system was used to create this note, attempts have been made to correct errors. Please contact the author with questions and/or clarifications.   Florencia Reasons, MD PhD FACP [facility]  Available via Epic secure  chat 7am-7pm for nonurgent issues Please page for urgent issues To page the attending provider between 7A-7P or the covering provider during after hours 7P-7A, please log into the web site [email or web address] and access using universal [place] password for that web site. If you do not have the password, please call the hospital operator.    08/28/2021, 8:14 AM

## 2021-08-28 NOTE — TOC Benefit Eligibility Note (Signed)
Transition of Care [facility]) Benefit Eligibility Note    Patient Details  Name: Kim Ross MRN: [id number] Date of Birth: [birth year]   Medication/Dose: Enoxaparin sq qd x 30 days  Covered?: Yes  Tier: Other  Prescription Coverage Preferred Pharmacy: local  Spoke with Person/Company/Phone Number:: [employer] [phone]  Co-Pay: $3.95  Prior Approval: No  Deductible: Met       Kerin Salen Phone Number: 08/28/2021, 10:58 AM

## 2021-08-28 NOTE — Progress Notes (Addendum)
1630) Notified that pt has gone back into Afib w RVR, rate as high as 185. A & O X 4. 1645) BP 104/60, rate cont in 160-170's. Pt w/d, c/o's nausea. MD notified of above. 1655) Lopressor given per order. CBG 74, attempted to give pt OJ, vomited approx 50 ccs. MD notified w above, awaiting meds from pharmacy. CN notified as well. 1730) BP 98/48, pulse ranging from 50's-140's w Multiform PVCs. MD aware, spoke w pt's husband and notified of change in pt's status and will tx to an ICU bed. 1815) Report called to [facility] and pt tx via bed to rm [id number].

## 2021-08-28 NOTE — TOC Progression Note (Signed)
Transition of Care [facility]) - Progression Note    Patient Details  Name: Kim Ross MRN: [id number] Date of Birth: 04-11-1956  Transition of [facility]) CM/SW Contact  Servando Snare, [place] Phone Number: 08/28/2021, 10:16 AM  Clinical Narrative:   TOC consulted for Lovenox benefits check. Benefits check pending. Will update when notification received.     Expected Discharge Plan: Home/Self Care Barriers to Discharge: Continued Medical Work up  Expected Discharge Plan and Services Expected Discharge Plan: Home/Self Care   Discharge Planning Services: CM Consult   Living arrangements for the past 2 months: Single Family Home                                       Social Determinants of Health (SDOH) Interventions    Readmission Risk Interventions No flowsheet data found.

## 2021-08-28 NOTE — Progress Notes (Signed)
PT Cancellation Note  Patient Details Name: Kim Ross MRN: [id number] DOB: 1956/01/03   Cancelled Treatment:    Reason Eval/Treat Not Completed: Other (comment) per chart, found to have thrombus extending into hypogastric veins (already anticoagulated), also hypotensive. Discussed case with MD who recommends hold for today. Will f/u when medically appropriate.   Windell Norfolk, DPT, PN2   Supplemental Physical Therapist [place]    Pager [phone] Acute Rehab Office [phone]

## 2021-08-28 NOTE — Progress Notes (Signed)
Calorie Count Note  48 hour calorie count ordered.  Diet: Regular, thin liquids Supplements: Ensure Plus High Protein TID, Glucerna Shake TID  Nutrition Dx:  Severe Malnutrition related to chronic illness, cancer and cancer related treatments as evidenced by severe fat depletion, severe muscle depletion.  Goal:  Patient will meet greater than or equal to 90% of their needs  Intervention:  - diet was liberalized from Heart Healthy/Carb Modified to Regular earlier this AM - continue Ensure Plus High Protein TID and Glucerna Shake TDI - RD will follow-up on 11/28      Jarome Matin, MS, RD, LDN, CNSC Inpatient Clinical Dietitian RD pager # available in [place]  After hours/weekend pager # available in [facility]

## 2021-08-28 NOTE — Progress Notes (Signed)
Daily Progress Note   Patient Name: Kim Ross       Date: 08/28/2021 DOB: 1956/02/12  Age: 65 y.o. MRN#: [id number] Attending Physician: Florencia Reasons, MD Primary Care Physician: Erskine Emery, MD Admit Date: 08/25/2021  Reason for Consultation/Follow-up: Establishing goals of care  Subjective: Chart Reviewed. Updates Received. Patient Assessed.   Patient awake and alert.  Sitting up in bed.  No acute distress noted.  Does appear weak.  Complains of left lower extremity pain which was found to be positive for DVT.  Appetite continues to be poor with some hypotension on today.  Patient and I reviewed discussed goals of care.  Confirms DNR/DNI.  I discussed at length with patient concerns for overall condition and poor long-term prognosis.  She verbalized understanding expressing she is remaining hopeful but does acknowledge ongoing weakness and feelings of decline.  She states she is hopeful as she would like additional time with her family but is starting to questioning her life expectancy.  Emotional support provided.  Recommendations for ongoing goals of care discussions with medical team.  Patient is clear at this time she wishes to continue to treat the treatable and allow her every opportunity to continue to thrive.  Continues to be concerned with no bowel movement despite MiraLAX and senna.  Discussed use of lactulose as needed to facilitate bowel movement.  All questions answered and support provided.   Length of Stay: 2 days  Vital Signs: BP 96/63 (BP Location: Right Arm)   Pulse 69   Temp 97.6 F (36.4 C) (Oral)   Resp 17   Ht 5\' 5"  (1.651 m)   Wt 52.9 kg   SpO2 100%   BMI 19.41 kg/m  SpO2: SpO2: 100 % O2 Device: O2 Device: Room Air O2 Flow Rate:    Physical Exam: NAD, frail, thin chronically ill-appearing Diminished bilaterally RRR Left lower extremity edema, painful to touch AAOx3, mood appropriate, generalized weakness             Palliative Care  Assessment & Plan  HPI:  Palliative Care consult requested for goals of care discussion in this 65 y.o. 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 She was admitted on 08/25/2021 from the [facility] after receiving chemotherapy found to have palpitations and atrial fibrillation.    Code Status: DNR  Goals of Care/Recommendations: Continue to treat the treatable to allow every opportunity to thrive Ongoing discussions regarding goals of care.  Open and honest conversations regarding concerns of patient's ability to have a meaningful recovery in addition to poor long-term prognosis.  Patient verbalized understanding and acknowledges her condition and ongoing weakness.  He is remaining hopeful but is also starting to prepare for the worst PMT will continue to support and follow.  Please reach out if any questions arise for now Palliative will follow on as needed basis please call if any questions or concerns arise.    Symptom Management: Constipation  Miralax twice daily Senna twice daily Lactulose one dose today and then as needed   Prognosis: Guarded-poor  Discharge Planning: To Be Determined  Thank you for allowing the Palliative Medicine Team to assist in the care of this patient.  Time Total: 40 min.   Visit consisted of counseling and education dealing with the complex and emotionally intense issues of symptom management and palliative care in the setting of serious  and potentially life-threatening illness.Greater than 50%  of this time was spent counseling and coordinating care related to the above assessment and plan.  Alda Lea, AGPCNP-BC  Palliative Medicine Team [phone]

## 2021-08-29 DIAGNOSIS — E43 Unspecified severe protein-calorie malnutrition: Secondary | ICD-10-CM

## 2021-08-29 DIAGNOSIS — I4891 Unspecified atrial fibrillation: Secondary | ICD-10-CM | POA: Diagnosis not present

## 2021-08-29 DIAGNOSIS — Z66 Do not resuscitate: Secondary | ICD-10-CM

## 2021-08-29 DIAGNOSIS — Z7189 Other specified counseling: Secondary | ICD-10-CM

## 2021-08-29 DIAGNOSIS — C3492 Malignant neoplasm of unspecified part of left bronchus or lung: Secondary | ICD-10-CM

## 2021-08-29 DIAGNOSIS — R52 Pain, unspecified: Secondary | ICD-10-CM | POA: Diagnosis not present

## 2021-08-29 DIAGNOSIS — Z515 Encounter for palliative care: Secondary | ICD-10-CM

## 2021-08-29 LAB — BASIC METABOLIC PANEL
Anion gap: 8 (ref 5–15)
BUN: 10 mg/dL (ref 8–23)
CO2: 24 mmol/L (ref 22–32)
Calcium: 7.8 mg/dL — ABNORMAL LOW (ref 8.9–10.3)
Chloride: 100 mmol/L (ref 98–111)
Creatinine, Ser: 0.33 mg/dL — ABNORMAL LOW (ref 0.44–1.00)
GFR, Estimated: >60 mL/min (ref >60)
Glucose, Bld: 90 mg/dL (ref 70–99)
Potassium: 3 mmol/L — ABNORMAL LOW (ref 3.5–5.1)
Sodium: 132 mmol/L — ABNORMAL LOW (ref 135–145)

## 2021-08-29 LAB — CBC WITH DIFFERENTIAL/PLATELET
Abs Immature Granulocytes: 0.02 10*3/uL (ref 0.00–0.07)
Basophils Absolute: 0 10*3/uL (ref 0.0–0.1)
Basophils Relative: 0 %
Eosinophils Absolute: 0 10*3/uL (ref 0.0–0.5)
Eosinophils Relative: 0 %
HCT: 27.8 % — ABNORMAL LOW (ref 36.0–46.0)
Hemoglobin: 8.3 g/dL — ABNORMAL LOW (ref 12.0–15.0)
Immature Granulocytes: 0 %
Lymphocytes Relative: 5 %
Lymphs Abs: 0.3 10*3/uL — ABNORMAL LOW (ref 0.7–4.0)
MCH: 27.9 pg (ref 26.0–34.0)
MCHC: 29.9 g/dL — ABNORMAL LOW (ref 30.0–36.0)
MCV: 93.6 fL (ref 80.0–100.0)
Monocytes Absolute: 0.1 10*3/uL (ref 0.1–1.0)
Monocytes Relative: 1 %
Neutro Abs: 6.1 10*3/uL (ref 1.7–7.7)
Neutrophils Relative %: 94 %
Platelets: 195 10*3/uL (ref 150–400)
RBC: 2.97 MIL/uL — ABNORMAL LOW (ref 3.87–5.11)
RDW: 19.2 % — ABNORMAL HIGH (ref 11.5–15.5)
WBC: 6.5 10*3/uL (ref 4.0–10.5)
nRBC: 0 % (ref 0.0–0.2)

## 2021-08-29 LAB — GLUCOSE, CAPILLARY
Glucose-Capillary: 94 mg/dL (ref 70–99)
Glucose-Capillary: 109 mg/dL — ABNORMAL HIGH (ref 70–99)
Glucose-Capillary: 116 mg/dL — ABNORMAL HIGH (ref 70–99)
Glucose-Capillary: 92 mg/dL (ref 70–99)

## 2021-08-29 LAB — HEPATIC FUNCTION PANEL
ALT: 11 U/L (ref 0–44)
AST: 22 U/L (ref 15–41)
Albumin: 1.6 g/dL — ABNORMAL LOW (ref 3.5–5.0)
Alkaline Phosphatase: 166 U/L — ABNORMAL HIGH (ref 38–126)
Bilirubin, Direct: 0.2 mg/dL (ref 0.0–0.2)
Indirect Bilirubin: 0.1 mg/dL — ABNORMAL LOW (ref 0.3–0.9)
Total Bilirubin: 0.3 mg/dL (ref 0.3–1.2)
Total Protein: 5 g/dL — ABNORMAL LOW (ref 6.5–8.1)

## 2021-08-29 LAB — MAGNESIUM: Magnesium: 1.6 mg/dL — ABNORMAL LOW (ref 1.7–2.4)

## 2021-08-29 LAB — ACTH STIMULATION, 3 TIME POINTS
Cortisol, 30 Min: 39.9 ug/dL
Cortisol, 60 Min: 42.8 ug/dL
Cortisol, Base: 20.3 ug/dL

## 2021-08-29 LAB — LACTIC ACID, PLASMA: Lactic Acid, Venous: 1.5 mmol/L (ref 0.5–1.9)

## 2021-08-29 MED ORDER — MAGNESIUM SULFATE 2 GM/50ML IV SOLN
2.0000 g | Freq: Once | INTRAVENOUS | Status: AC
  Administered 2021-08-29: 2 g via INTRAVENOUS

## 2021-08-29 MED ORDER — MELATONIN 3 MG PO TABS
3.0000 mg | ORAL_TABLET | Freq: Every day | ORAL | Status: DC
Start: 2021-08-29 — End: 2021-09-14
  Administered 2021-08-29 – 2021-09-13 (×15): 3 mg via ORAL
  Filled 2021-08-29 (×15): qty 1

## 2021-08-29 MED ORDER — FLUCONAZOLE IN SODIUM CHLORIDE 200-0.9 MG/100ML-% IV SOLN
200.0000 mg | INTRAVENOUS | Status: DC
Start: 2021-08-29 — End: 2021-09-01
  Administered 2021-08-29 – 2021-08-31 (×3): 200 mg via INTRAVENOUS
  Filled 2021-08-29 (×3): qty 100

## 2021-08-29 MED ORDER — POTASSIUM CHLORIDE 10 MEQ/100ML IV SOLN
10.0000 meq | INTRAVENOUS | Status: AC
  Administered 2021-08-29 (×6): 10 meq via INTRAVENOUS
  Filled 2021-08-29 (×6): qty 100

## 2021-08-29 MED ORDER — PANTOPRAZOLE SODIUM 40 MG IV SOLR
40.0000 mg | Freq: Two times a day (BID) | INTRAVENOUS | Status: DC
  Administered 2021-08-29 – 2021-09-06 (×18): 40 mg via INTRAVENOUS
  Filled 2021-08-29 (×18): qty 40

## 2021-08-29 MED ORDER — MIRTAZAPINE 15 MG PO TBDP
15.0000 mg | ORAL_TABLET | Freq: Every day | ORAL | Status: DC
  Administered 2021-08-29 – 2021-09-27 (×29): 15 mg via ORAL
  Filled 2021-08-29 (×31): qty 1
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
## 2021-08-29 NOTE — Evaluation (Signed)
Clinical/Bedside Swallow Evaluation Patient Details  Name: Kim Ross MRN: [id number] Date of Birth: Apr 22, 1956  Today's Date: 08/29/2021 Time: SLP Start Time (ACUTE ONLY): 1300 SLP Stop Time (ACUTE ONLY): 1312 SLP Time Calculation (min) (ACUTE ONLY): 12 min  Past Medical History:  Past Medical History:  Diagnosis Date   Acute pain of left shoulder 12/01/2020   Anxiety    Aspiration pneumonitis ([place]) 08/05/2019   Breast cancer screening 12/30/2017   Chest wall pain 11/05/2019   Cocaine abuse ([place])    Depression    Drug intoxication ([place]) 08/05/2019   Encounter for screening for HIV 08/05/2019   Goiter 09/05/2019   History of recurrent UTIs    No-show for appointment 09/24/2020   Pneumonia    Pre-diabetes    Urinary frequency 04/10/2013   Past Surgical History:  Past Surgical History:  Procedure Laterality Date   ABDOMINAL HYSTERECTOMY     BRONCHIAL NEEDLE ASPIRATION BIOPSY  06/17/2021   Procedure: BRONCHIAL NEEDLE ASPIRATION BIOPSIES;  Surgeon: Maryjane Hurter, MD;  Location: [facility];  Service: Pulmonary;;   ENDOBRONCHIAL ULTRASOUND Left 06/17/2021   Procedure: ENDOBRONCHIAL ULTRASOUND;  Surgeon: Maryjane Hurter, MD;  Location: [facility];  Service: Pulmonary;  Laterality: Left;   HEMORRHOID SURGERY     VIDEO BRONCHOSCOPY  06/17/2021   Procedure: VIDEO BRONCHOSCOPY;  Surgeon: Maryjane Hurter, MD;  Location: [facility];  Service: Pulmonary;;   HPI:  H/p stage Iv lung ca, developed aifb/rvr right after chemo on 11/22, sent to ED from cancer center, found to be in [place], acute dvt left lower leg. Forty pound weight loss past 6  months. ICU RN reports pt was unable to swallow- MD concerned about esophagitis and will ask GI to see.    Assessment / Plan / Recommendation  Clinical Impression  Oral-motor exam significant for candidias coating lingual surface and pt affirms odonophagia. She is missing most of upper dentition and some lower and produced weak  volitional cough. Vocal quality is clear and adequate. Facial grimace during swallows of water. From subjective view her timing, coordination and laryngeal protection did not appear compromised and therapist agrees with MD that she likey has esophagitis causing odonophagia. Recommend regular texture, thin liquids and pt can choose softer or bland items if she prefers. Meds per pt preference (whole in puree or crushed may be easier). Her back hurts when sitting up however encouraged her to sit up with all meals. No further ST needed. SLP Visit Diagnosis: Dysphagia, unspecified (R13.10)    Aspiration Risk  Mild aspiration risk    Diet Recommendation Regular;Thin liquid   Liquid Administration via: Cup;Straw Medication Administration: Whole meds with puree (or crush- pt preference) Supervision: Patient able to self feed Postural Changes: Seated upright at 90 degrees    Other  Recommendations Oral Care Recommendations: Oral care BID    Recommendations for follow up therapy are one component of a multi-disciplinary discharge planning process, led by the attending physician.  Recommendations may be updated based on patient status, additional functional criteria and insurance authorization.  Follow up Recommendations No SLP follow up      Assistance Recommended at Discharge None  Functional Status Assessment    Frequency and Duration            Prognosis        Swallow Study   General Date of Onset: 08/25/21 HPI: H/p stage Iv lung ca, developed aifb/rvr right after chemo on 11/22, sent to ED from cancer center, found to be  in [place], acute dvt left lower leg. Forty pound weight loss past 6  months. ICU RN reports pt was unable to swallow- MD concerned about esophagitis and will ask GI to see. Type of Study: Bedside Swallow Evaluation Previous Swallow Assessment: no Diet Prior to this Study: NPO Temperature Spikes Noted: No Respiratory Status: Room air History of Recent Intubation:  No Behavior/Cognition: Alert;Cooperative;Pleasant mood Oral Cavity Assessment: Other (comment) (significant lingual candidias) Oral Care Completed by SLP: No Oral Cavity - Dentition: Poor condition;Missing dentition Vision: Functional for self-feeding Self-Feeding Abilities: Able to feed self Patient Positioning: Upright in bed Baseline Vocal Quality: Normal Volitional Cough: Weak Volitional Swallow: Able to elicit    Oral/Motor/Sensory Function Overall Oral Motor/Sensory Function: Within functional limits   Ice Chips Ice chips: Not tested   Thin Liquid Thin Liquid: Within functional limits    Nectar Thick Nectar Thick Liquid: Not tested   Honey Thick Honey Thick Liquid: Not tested   Puree Puree: Within functional limits   Solid     Solid: Within functional limits      Houston Siren 08/29/2021,1:27 PM

## 2021-08-29 NOTE — Progress Notes (Signed)
PT Cancellation Note  Patient Details Name: Kim Ross MRN: [id number] DOB: 12-16-1955   Cancelled Treatment:    Reason Eval/Treat Not Completed: Medical issues which prohibited therapy.  MD asked PT to hold 11/25 d/t thrombus extending into hypogastric veins (already anticoagulated), also hypotensive  Last night (11/25) pt was determined to be in afib with RVR with rates in 160-170s, continued to be hypotensive, was  transferred to ICU  Defer PT today, continue to follow in acute setting.   [facility] 08/29/2021, 9:31 AM

## 2021-08-29 NOTE — Progress Notes (Signed)
Daily Progress Note   Patient Name: Kim Ross       Date: 08/29/2021 DOB: 1956/08/31  Age: 65 y.o. MRN#: [id number] Attending Physician: Florencia Reasons, MD Primary Care Physician: Erskine Emery, MD Admit Date: 08/25/2021  Reason for Consultation/Follow-up: Establishing goals of care  Subjective: Chart Reviewed. Updates Received. Patient Assessed.   Kim Ross is awake and alert and oriented x3.  Appears weak.  Unfortunately required transfer to ICU due to atrial fibrillation with RVR.  Complains of left lower extremity pain.  Appetite remains poor.  I discussed at length patient's current condition.  I shared concerns for patient's ability to have a meaningful recovery due to increased weakness, failure to thrive, and multiple comorbidities.  She verbalized understanding expressing she does not feel well today and that she is becoming more concerned that her health is declining.  I offered to engage in further discussions and include her significant other however she declines sharing some challenging relationship barriers amongst them.  Emotional support provided.  Patient observed answering phone call during my visit which she states was her nephew.  She expressed to him wishes for him to come visit as she is unsure how much longer she would be alive as she feels she is becoming weaker by the day and unsure how much more her body can take.  Education provided on poor nutrition.  Patient states she does not have appetite despite our efforts and knows that her oral intake must increase to allow her an opportunity to feel better and continue to thrive.  She is not interested in artificial feedings/PEG.  States when she gets to that point she would then wish to be comfortable and focus on what time she has left.  Most form introduced.  Kim Ross is clear and expressed wishes to continue to treat the treatable at this time with ongoing support as she navigates through best decisions in the upcoming days and  future.  All questions answered and support provided.   Length of Stay: 3 days  Vital Signs: BP 128/65 (BP Location: Right Arm)   Pulse 72   Temp 98.7 F (37.1 C) (Oral)   Resp 16   Ht 5\' 5"  (1.651 m)   Wt 50.8 kg   SpO2 98%   BMI 18.64 kg/m  SpO2: SpO2: 98 % O2 Device: O2 Device: Room Air O2 Flow Rate:    Physical Exam: NAD, frail, thin chronically ill-appearing Diminished bilaterally RRR Left lower extremity edema, painful to touch AAOx3, mood appropriate, generalized weakness             Palliative Care Assessment & Plan  HPI:  Palliative Care consult requested for goals of care discussion in this 65 y.o. 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 She was admitted on 08/25/2021 from the [facility] after receiving chemotherapy found to have palpitations and atrial fibrillation.    Code Status: DNR  Goals of Care/Recommendations: Continue to treat the treatable to allow every opportunity to thrive Ongoing discussions regarding goals of care.  Open and honest conversations regarding concerns of patient's ability to have a meaningful recovery in addition to poor long-term prognosis.  Patient verbalized understanding and acknowledges her condition and ongoing weakness.  She is remaining hopeful but is also starting to prepare for the worst.  Discussions regarding poor oral nutrition/failure to thrive.  Patient verbalized understanding.  Would  not want any forms of artificial feeding/PEG. PMT will continue to support and follow.    Symptom Management: Constipation  Miralax twice daily Senna twice daily Lactulose as needed -reports small bowel movement today  Prognosis: Guarded-poor  Discharge Planning: To Be Determined  Thank you for allowing the Palliative Medicine Team to assist in the care  of this patient.  Time Total: 45 min  Visit consisted of counseling and education dealing with the complex and emotionally intense issues of symptom management and palliative care in the setting of serious and potentially life-threatening illness.Greater than 50%  of this time was spent counseling and coordinating care related to the above assessment and plan.  Alda Lea, AGPCNP-BC  Palliative Medicine Team [phone]

## 2021-08-30 DIAGNOSIS — R52 Pain, unspecified: Secondary | ICD-10-CM | POA: Diagnosis not present

## 2021-08-30 DIAGNOSIS — I4891 Unspecified atrial fibrillation: Secondary | ICD-10-CM | POA: Diagnosis not present

## 2021-08-30 DIAGNOSIS — C3492 Malignant neoplasm of unspecified part of left bronchus or lung: Secondary | ICD-10-CM | POA: Diagnosis not present

## 2021-08-30 DIAGNOSIS — E43 Unspecified severe protein-calorie malnutrition: Secondary | ICD-10-CM | POA: Diagnosis not present

## 2021-08-30 LAB — CBC
HCT: 23.5 % — ABNORMAL LOW (ref 36.0–46.0)
Hemoglobin: 7.6 g/dL — ABNORMAL LOW (ref 12.0–15.0)
MCH: 29 pg (ref 26.0–34.0)
MCHC: 32.3 g/dL (ref 30.0–36.0)
MCV: 89.7 fL (ref 80.0–100.0)
Platelets: 175 10*3/uL (ref 150–400)
RBC: 2.62 MIL/uL — ABNORMAL LOW (ref 3.87–5.11)
RDW: 19 % — ABNORMAL HIGH (ref 11.5–15.5)
WBC: 5.4 10*3/uL (ref 4.0–10.5)
nRBC: 0 % (ref 0.0–0.2)

## 2021-08-30 LAB — BASIC METABOLIC PANEL
Anion gap: 4 — ABNORMAL LOW (ref 5–15)
BUN: 6 mg/dL — ABNORMAL LOW (ref 8–23)
CO2: 27 mmol/L (ref 22–32)
Calcium: 7.9 mg/dL — ABNORMAL LOW (ref 8.9–10.3)
Chloride: 100 mmol/L (ref 98–111)
Creatinine, Ser: 0.33 mg/dL — ABNORMAL LOW (ref 0.44–1.00)
GFR, Estimated: >60 mL/min (ref >60)
Glucose, Bld: 111 mg/dL — ABNORMAL HIGH (ref 70–99)
Potassium: 3.5 mmol/L (ref 3.5–5.1)
Sodium: 131 mmol/L — ABNORMAL LOW (ref 135–145)

## 2021-08-30 LAB — BODY FLUID CULTURE W GRAM STAIN: Culture: NO GROWTH

## 2021-08-30 LAB — GLUCOSE, CAPILLARY
Glucose-Capillary: 106 mg/dL — ABNORMAL HIGH (ref 70–99)
Glucose-Capillary: 117 mg/dL — ABNORMAL HIGH (ref 70–99)
Glucose-Capillary: 140 mg/dL — ABNORMAL HIGH (ref 70–99)
Glucose-Capillary: 119 mg/dL — ABNORMAL HIGH (ref 70–99)

## 2021-08-30 LAB — PREPARE RBC (CROSSMATCH)

## 2021-08-30 LAB — MAGNESIUM: Magnesium: 1.5 mg/dL — ABNORMAL LOW (ref 1.7–2.4)

## 2021-08-30 MED ORDER — SODIUM CHLORIDE 0.9% IV SOLUTION: Freq: Once | INTRAVENOUS | Status: AC

## 2021-08-30 MED ORDER — KCL IN DEXTROSE-NACL 20-5-0.9 MEQ/L-%-% IV SOLN
INTRAVENOUS | Status: DC
  Filled 2021-08-30 (×2): qty 1000
  Filled 2021-08-30: qty 2000

## 2021-08-30 MED ORDER — MAGNESIUM SULFATE 4 GM/100ML IV SOLN
4.0000 g | Freq: Once | INTRAVENOUS | Status: AC
  Administered 2021-08-30: 13:00:00 4 g via INTRAVENOUS
  Filled 2021-08-30: qty 100

## 2021-08-30 MED ORDER — POTASSIUM CHLORIDE 20 MEQ PO PACK
40.0000 meq | PACK | Freq: Once | ORAL | Status: AC
  Administered 2021-08-30: 11:00:00 40 meq via ORAL
  Filled 2021-08-30: qty 2

## 2021-08-30 MED ORDER — METOPROLOL TARTRATE 5 MG/5ML IV SOLN
2.5000 mg | Freq: Once | INTRAVENOUS | Status: AC
  Administered 2021-08-30: 12:00:00 2.5 mg via INTRAVENOUS
  Filled 2021-08-30: qty 5
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
## 2021-08-31 ENCOUNTER — Inpatient Hospital Stay (HOSPITAL_COMMUNITY): Payer: Medicare Other

## 2021-08-31 DIAGNOSIS — I4891 Unspecified atrial fibrillation: Secondary | ICD-10-CM | POA: Diagnosis not present

## 2021-08-31 LAB — CBC
HCT: 27.9 % — ABNORMAL LOW (ref 36.0–46.0)
Hemoglobin: 9.3 g/dL — ABNORMAL LOW (ref 12.0–15.0)
MCH: 29 pg (ref 26.0–34.0)
MCHC: 33.3 g/dL (ref 30.0–36.0)
MCV: 86.9 fL (ref 80.0–100.0)
Platelets: 141 10*3/uL — ABNORMAL LOW (ref 150–400)
RBC: 3.21 MIL/uL — ABNORMAL LOW (ref 3.87–5.11)
RDW: 19 % — ABNORMAL HIGH (ref 11.5–15.5)
WBC: 5.1 10*3/uL (ref 4.0–10.5)
nRBC: 0 % (ref 0.0–0.2)

## 2021-08-31 LAB — TYPE AND SCREEN
ABO/RH(D): B POS
Antibody Screen: NEGATIVE
Unit division: 0

## 2021-08-31 LAB — CYTOLOGY - NON PAP

## 2021-08-31 LAB — GLUCOSE, CAPILLARY
Glucose-Capillary: 83 mg/dL (ref 70–99)
Glucose-Capillary: 92 mg/dL (ref 70–99)
Glucose-Capillary: 98 mg/dL (ref 70–99)
Glucose-Capillary: 86 mg/dL (ref 70–99)

## 2021-08-31 LAB — BPAM RBC
Blood Product Expiration Date: 202212062359
ISSUE DATE / TIME: 202211271526
Unit Type and Rh: 7300

## 2021-08-31 LAB — BASIC METABOLIC PANEL
Anion gap: 7 (ref 5–15)
BUN: <5 mg/dL — ABNORMAL LOW (ref 8–23)
CO2: 27 mmol/L (ref 22–32)
Calcium: 7.8 mg/dL — ABNORMAL LOW (ref 8.9–10.3)
Chloride: 98 mmol/L (ref 98–111)
Creatinine, Ser: <0.3 mg/dL — ABNORMAL LOW (ref 0.44–1.00)
Glucose, Bld: 100 mg/dL — ABNORMAL HIGH (ref 70–99)
Potassium: 3.5 mmol/L (ref 3.5–5.1)
Sodium: 132 mmol/L — ABNORMAL LOW (ref 135–145)

## 2021-08-31 LAB — MAGNESIUM: Magnesium: 1.7 mg/dL (ref 1.7–2.4)

## 2021-08-31 MED ORDER — ENOXAPARIN SODIUM 60 MG/0.6ML IJ SOSY
50.0000 mg | PREFILLED_SYRINGE | Freq: Two times a day (BID) | INTRAMUSCULAR | Status: DC
  Administered 2021-09-01 – 2021-09-02 (×4): 50 mg via SUBCUTANEOUS
  Filled 2021-08-31 (×5): qty 0.5

## 2021-08-31 MED ORDER — ENOXAPARIN SODIUM 60 MG/0.6ML IJ SOSY: 50.0000 mg | PREFILLED_SYRINGE | Freq: Two times a day (BID) | INTRAMUSCULAR | Status: DC

## 2021-08-31 NOTE — Progress Notes (Signed)
Physical Therapy Treatment Patient Details Name: Kim Ross MRN: [id number] DOB: [birth year] Today's Date: 08/31/2021   History of Present Illness 65yo female who presented on 11/22 after having palpitations while at [facility]; found to be in Afib with RVR and transferred to ED. PE negative but did have pleural effusion, addressed with thoracentesis on 11/23. 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 Vital signs stable with mobility. Pt performed BLE strengthening exercises in sitting. She reported she was too fatigued to attempt ambulation.     Recommendations for follow up therapy are one component of a multi-disciplinary discharge planning process, led by the attending physician.  Recommendations may be updated based on patient status, additional functional criteria and insurance authorization.  Follow Up Recommendations  Home health PT     Assistance Recommended at Discharge    Equipment Recommendations  Rolling walker (2 wheels)    Recommendations for Other Services       Precautions / Restrictions Precautions Precautions: Fall Precaution Comments: high fracture risk due CA, L LE pain, watch vitals Restrictions Weight Bearing Restrictions: No     Mobility  Bed Mobility Overal bed mobility: Needs Assistance Bed Mobility: Rolling;Sidelying to Sit Rolling: Min guard Sidelying to sit: Min assist       General bed mobility comments: increased time and effort, VCs for technique, pt reported R upper back pain with mobility    Transfers Overall transfer level: Needs assistance Equipment used: Rolling walker (2 wheels) Transfers: Sit to/from Stand;Bed to chair/wheelchair/BSC Sit to Stand: Min assist Stand pivot transfers: Min guard         General transfer comment: VCs for hand placement, min A to power up, VSS    Ambulation/Gait                General Gait Details: Pt reported she was too fatigued with transfer to attempt ambulation   Stairs             Wheelchair Mobility    Modified Rankin (Stroke Patients Only)       Balance Overall balance assessment: Needs assistance Sitting-balance support: No upper extremity supported;Feet supported Sitting balance-Leahy Scale: Good     Standing balance support: Bilateral upper extremity supported;During functional activity;Reliant on assistive device for balance Standing balance-Leahy Scale: Poor                              Cognition Arousal/Alertness: Awake/alert Behavior During Therapy: WFL for tasks assessed/performed Overall Cognitive Status: Within Functional Limits for tasks assessed                                          Exercises General Exercises - Lower Extremity Ankle Circles/Pumps: AROM;Both;5 reps;Supine Long Arc Quad: AROM;Both;10 reps;Seated Hip Flexion/Marching: AROM;Both;10 reps;Seated    General Comments        Pertinent Vitals/Pain Pain Score: 8  Pain Location: R upper back Pain Descriptors / Indicators: Sore Pain Intervention(s): Limited activity within patient's tolerance;Monitored during session;Patient requesting pain meds-RN notified;Repositioned    Home Living                          Prior Function  PT Goals (current goals can now be found in the care plan section) Acute Rehab PT Goals Patient Stated Goal: go home PT Goal Formulation: With patient Time For Goal Achievement: 09/10/21 Potential to Achieve Goals: Good Progress towards PT goals: Progressing toward goals    Frequency    Min 3X/week      PT Plan Current plan remains appropriate    Co-evaluation              AM-PAC PT "6 Clicks" Mobility   Outcome Measure  Help needed turning from your back to your side while in a flat bed without using bedrails?: A Little Help needed moving from lying on  your back to sitting on the side of a flat bed without using bedrails?: A Little Help needed moving to and from a bed to a chair (including a wheelchair)?: A Little Help needed standing up from a chair using your arms (e.g., wheelchair or bedside chair)?: A Little Help needed to walk in hospital room?: Total Help needed climbing 3-5 steps with a railing? : Total 6 Click Score: 14    End of Session Equipment Utilized During Treatment: Gait belt Activity Tolerance: Patient tolerated treatment well;Patient limited by fatigue Patient left: in chair;with call bell/phone within reach;with chair alarm set Nurse Communication: Mobility status PT Visit Diagnosis: Unsteadiness on feet (R26.81);Muscle weakness (generalized) (M62.81);Difficulty in walking, not elsewhere classified (R26.2);Pain     Time: 8377-9396 PT Time Calculation (min) (ACUTE ONLY): 16 min  Charges:  $Therapeutic Activity: 8-22 mins                    Blondell Reveal Kistler PT 08/31/2021  Acute Rehabilitation Services Pager [phone] Office [phone]

## 2021-08-31 NOTE — Progress Notes (Signed)
Nutrition Follow-up  DOCUMENTATION CODES:   Severe malnutrition in context of chronic illness  INTERVENTION:  - diet re-advancement as medically feasible. - continue Ensure Plus High Protein TID.  - will d/c Calorie Count.  - if PO intakes do not improve following EGD with dilation, recommend small bore NGT placement with TF initiation.    NUTRITION DIAGNOSIS:   Severe Malnutrition related to chronic illness, cancer and cancer related treatments as evidenced by severe fat depletion, severe muscle depletion. -ongoing  GOAL:   Patient will meet greater than or equal to 90% of their needs -unmet, unable to meet   MONITOR:   PO intake, Supplement acceptance, Labs, Weight trends, Skin, I & O's  REASON FOR ASSESSMENT:   Consult Calorie Count  ASSESSMENT:   65 yo female with a PMH of stage IV non-small cell lung cancer, diabetes mellitus type 2, hyperlipidemia who has been recently diagnosed with lung cancer receiving chemotherapy was found to be having palpitations and found to be in A. fib with RVR at the chemotherapy infusion center and was referred to the ER.  Patient states over the last 2 months patient has been having intermittent episodes of palpitations with some chest pressure.  Also has been chronically short of breath. Admitted with A-fib with RVR.  Patient was assessed by another RD on 11/23. Calorie Count ordered late morning on 11/25 and was to be completed through dinner yesterday (11/27).   No meal tickets in Calorie Count envelope hung on the door. The only documented meal completion percentages since 11/25 were 0% of lunch on 11/25 and 100% of breakfast yesterday (305 kcal and 3 grams protein).   Review of Health Touch indicates patient did not order lunch or dinner on 11/25; did not order any meals on 11/26; did not order lunch or dinner on 11/27.   Review of order indicates that she has been accepting Ensure 75% of the time offered.   She has been NPO since  midnight with order also in place for her to be NPO at midnight tonight-11/29. Plan at this time is for EGD with possible dilation 11/29 AM.   She was weighed on 11/22, 11/24, and 11/25 this admission and weight was -5 lb from 11/24-11/25. Moderate pitting edema to LLE documented in the edema section of flow sheet. She is noted to be +2.2 L since admission.     Labs reviewed; CBG: 83 mg/dl, Na: 132 mmol/l, BUN: <5 mg/dl, creatinine: <0.3 mg/dl, Ca: 7.8 mg/dl.   Medications reviewed; 1 mg folvite/day, sliding scale novolog, 4 g IV Mg sulfate x1 run 11/27, 3 mg melatonin/day, 1 tablet multivitamin with minerals/day, 40 mg IV protonix BID, 5 ml mycostatin QID, 1 tablet senokot BID, 17 g miralax BID, 1 tablet senokot BID.   IVF; D5-NS-20 mEq IV KCl @ 75 ml/hr (306 kcal/24 hrs).   Diet Order:   Diet Order             Diet NPO time specified  Diet effective midnight           Diet NPO time specified  Diet effective midnight                   EDUCATION NEEDS:   Education needs have been addressed  Skin:  Skin Assessment: Skin Integrity Issues: Skin Integrity Issues:: Stage II Stage II: Coccyx  Last BM:  11/26 (type 4)  Height:   Ht Readings from Last 1 Encounters:  08/28/21 5\' 5"  (1.651 m)  Weight:   Wt Readings from Last 1 Encounters:  08/28/21 50.8 kg     Estimated Nutritional Needs:  Kcal:  2100-2300 Protein:  80-95 grams Fluid:  >2.1 L     Jarome Matin, MS, RD, LDN, CNSC Inpatient Clinical Dietitian RD pager # available in [facility]  After hours/weekend pager # available in [facility]

## 2021-08-31 NOTE — Consult Note (Signed)
Referring Provider: [facility] Primary Care Physician:  Erskine Emery, MD Primary Gastroenterologist:  Althia Forts  Reason for Consultation:  Dysphagia  HPI: Kim Ross is a 65 y.o. female  history of stage IV non-small cell lung cancer, diabetes mellitus type 2, hyperlipidemia who has been recently diagnosed with lung cancer receiving chemotherapy presenting with dysphagia.  Patient was originally brought to ED from chemotherapy infusion center as she was having palpitations and was found to be in A. Fib with RVR and acute DVT left lower leg.  Patient reports dysphagia ongoing for a few months. Is not daily, happens a handful of times per week. Occasional odynophagia. States she feels like solids/liquids get "stuck" in her suprasternal notch area. Denies nausea/vomiting. Denies abdominal pain. Denies melena/hematochezia. Denies previous history of reflux.  No previous history of colonoscopy. Denies family history of colon cancer.   Past Medical History:  Diagnosis Date   Acute pain of left shoulder 12/01/2020   Anxiety    Aspiration pneumonitis ([place]) 08/05/2019   Breast cancer screening 12/30/2017   Chest wall pain 11/05/2019   Cocaine abuse ([place])    Depression    Drug intoxication ([place]) 08/05/2019   Encounter for screening for HIV 08/05/2019   Goiter 09/05/2019   History of recurrent UTIs    No-show for appointment 09/24/2020   Pneumonia    Pre-diabetes    Urinary frequency 04/10/2013    Past Surgical History:  Procedure Laterality Date   ABDOMINAL HYSTERECTOMY     BRONCHIAL NEEDLE ASPIRATION BIOPSY  06/17/2021   Procedure: BRONCHIAL NEEDLE ASPIRATION BIOPSIES;  Surgeon: Maryjane Hurter, MD;  Location: [facility];  Service: Pulmonary;;   ENDOBRONCHIAL ULTRASOUND Left 06/17/2021   Procedure: ENDOBRONCHIAL ULTRASOUND;  Surgeon: Maryjane Hurter, MD;  Location: [facility];  Service: Pulmonary;  Laterality: Left;   HEMORRHOID SURGERY     VIDEO BRONCHOSCOPY  06/17/2021    Procedure: VIDEO BRONCHOSCOPY;  Surgeon: Maryjane Hurter, MD;  Location: [facility];  Service: Pulmonary;;    Prior to Admission medications   Medication Sig Start Date End Date Taking? Authorizing Provider  atorvastatin (LIPITOR) 40 MG tablet TAKE 1 TABLET BY MOUTH EVERY DAY Patient taking differently: Take 40 mg by mouth daily. 02/03/21  Yes Milus Banister C, DO  feeding supplement (ENSURE ENLIVE / ENSURE PLUS) LIQD Take 237 mLs by mouth 2 (two) times daily between meals. 06/18/21  Yes Mariel Aloe, MD  fluticasone (FLONASE) 50 MCG/ACT nasal spray Place 1 spray into both nostrils daily. 1 spray in each nostril every day 04/29/21  Yes Gladys Damme, MD  meclizine (ANTIVERT) 25 MG tablet TAKE 1 TABLET BY MOUTH TWICE A DAY 10/09/20  Yes Milus Banister C, DO  metFORMIN (GLUCOPHAGE) 500 MG tablet TAKE 1 TABLET BY MOUTH 2 TIMES DAILY WITH A MEAL. Patient taking differently: Take 250 mg by mouth daily with breakfast. 05/20/21  Yes Erskine Emery, MD  Multiple Vitamins-Minerals (MULTIVITAMIN WITH MINERALS) tablet Take 1 tablet by mouth daily. Patient taking differently: Take 1 tablet by mouth every other day. 04/27/13  Yes Olam Idler, MD  prochlorperazine (COMPAZINE) 10 MG tablet Take 1 tablet (10 mg total) by mouth every 6 (six) hours as needed. 07/08/21  Yes Heilingoetter, Cassandra L, PA-C  sertraline (ZOLOFT) 50 MG tablet TAKE 1 TABLET BY MOUTH EVERY DAY Patient taking differently: Take 50 mg by mouth daily. 11/30/20  Yes Milus Banister C, DO  triamcinolone ointment (KENALOG) 0.5 % APPLY 1 APPLICATION TOPICALLY 2 (TWO) TIMES DAILY. FOR MODERATE TO  SEVERE ECZEMA. DO NOT USE FOR MORE THAN 1 WEEK AT A TIME. Patient taking differently: 1 application See admin instructions. Apply twice daily for moderate to severe eczema.  Do not use for more than 1 week at a time. 08/10/21  Yes Eppie Gibson, MD  fentaNYL (DURAGESIC) 50 MCG/HR Place 1 patch onto the skin every 3 (three) days. 08/26/21    Curt Bears, MD  fluconazole (DIFLUCAN) 100 MG tablet Take 2 tablets today, then 1 tablet daily x 13 more days. Hold atorvastatin while on this medication. 08/26/21   Curt Bears, MD  folic acid (FOLVITE) 1 MG tablet Take 1 tablet (1 mg total) by mouth daily. 08/26/21   Curt Bears, MD  HYDROcodone-acetaminophen (NORCO/VICODIN) 5-325 MG tablet Take 1 tablet by mouth every 6 (six) hours as needed for moderate pain. 08/26/21   Curt Bears, MD    Scheduled Meds:  atorvastatin  40 mg Oral Daily   Chlorhexidine Gluconate Cloth  6 each Topical Daily   Chlorhexidine Gluconate Cloth  6 each Topical Q0600   enoxaparin (LOVENOX) injection  50 mg Subcutaneous BID   feeding supplement  237 mL Oral TID BM   fentaNYL  1 patch Transdermal V40J   folic acid  1 mg Oral Daily   insulin aspart  0-9 Units Subcutaneous TID WC   meclizine  25 mg Oral BID   mouth rinse  15 mL Mouth Rinse BID   melatonin  3 mg Oral QHS   midodrine  2.5 mg Oral TID WC   mirtazapine  15 mg Oral QHS   multivitamin with minerals  1 tablet Oral QODAY   nystatin  5 mL Oral QID   pantoprazole (PROTONIX) IV  40 mg Intravenous Q12H   polyethylene glycol  17 g Oral BID   senna-docusate  1 tablet Oral BID   sertraline  50 mg Oral Daily   tamsulosin  0.4 mg Oral QPC supper   zinc oxide   Topical TID   Continuous Infusions:  amiodarone 30 mg/hr (08/31/21 0400)   dextrose 5 % and 0.9 % NaCl with KCl 20 mEq/L 75 mL/hr at 08/30/21 1759   fluconazole (DIFLUCAN) IV Stopped (08/30/21 2051)   PRN Meds:.docusate sodium, HYDROcodone-acetaminophen, lactulose, mineral oil-hydrophilic petrolatum, ondansetron (ZOFRAN) IV  Allergies as of 08/25/2021 - Review Complete 08/25/2021  Allergen Reaction Noted   Ibuprofen Nausea And Vomiting 07/18/2018   Trazodone and nefazodone  07/26/2013    Family History  Problem Relation Age of Onset   Asthma Mother    Cancer Sister 19       Pancreatic    Social History    Socioeconomic History   Marital status: Single    Spouse name: Not on file   Number of children: Not on file   Years of education: Not on file   Highest education level: Not on file  Occupational History   Not on file  Tobacco Use   Smoking status: Some Days    Packs/day: 0.25    Types: Cigarettes   Smokeless tobacco: Never  Vaping Use   Vaping Use: Never used  Substance and Sexual Activity   Alcohol use: Not Currently    Alcohol/week: 1.0 standard drink    Types: 1 Cans of beer per week   Drug use: No   Sexual activity: Never  Other Topics Concern   Not on file  Social History Narrative   Not on file   Social Determinants of Health   Financial Resource Strain: High  Risk   Difficulty of Paying Living Expenses: Very hard  Food Insecurity: Not on file  Transportation Needs: Not on file  Physical Activity: Not on file  Stress: Not on file  Social Connections: Not on file  Intimate Partner Violence: Not on file    Review of Systems: Review of Systems  Constitutional:  Negative for chills and fever.  HENT:  Negative for ear pain and sinus pain.   Eyes:  Negative for pain and redness.  Respiratory:  Negative for cough and shortness of breath.   Cardiovascular:  Negative for chest pain and palpitations.  Gastrointestinal:  Negative for abdominal pain, blood in stool, constipation, diarrhea, heartburn, melena, nausea and vomiting.       Dysphagia  Genitourinary:  Negative for dysuria and urgency.  Musculoskeletal:  Negative for myalgias and neck pain.  Skin:  Negative for itching and rash.  Neurological:  Negative for seizures and loss of consciousness.  Psychiatric/Behavioral:  Negative for suicidal ideas. The patient is not nervous/anxious.    Physical Exam:Physical Exam Constitutional:      General: She is not in acute distress.    Appearance: Normal appearance. She is not ill-appearing.  HENT:     Head: Normocephalic and atraumatic.     Nose: Nose normal. No  congestion.     Mouth/Throat:     Mouth: Mucous membranes are moist.     Pharynx: Oropharynx is clear.  Eyes:     General: No scleral icterus.    Extraocular Movements: Extraocular movements intact.     Conjunctiva/sclera: Conjunctivae normal.  Cardiovascular:     Rate and Rhythm: Normal rate and regular rhythm.  Pulmonary:     Effort: Pulmonary effort is normal. No respiratory distress.  Abdominal:     General: Abdomen is flat. Bowel sounds are normal. There is no distension.     Palpations: Abdomen is soft. There is no mass.     Tenderness: There is no abdominal tenderness. There is no guarding or rebound.     Hernia: No hernia is present.  Musculoskeletal:        General: No swelling. Normal range of motion.     Cervical back: Normal range of motion. No rigidity.  Skin:    General: Skin is warm.     Coloration: Skin is not jaundiced.  Neurological:     General: No focal deficit present.     Mental Status: She is alert and oriented to person, place, and time.  Psychiatric:        Mood and Affect: Mood normal.        Behavior: Behavior normal.        Thought Content: Thought content normal.        Judgment: Judgment normal.    Vital signs: Vitals:   08/31/21 0400 08/31/21 0600  BP: (!) 90/57 (!) 99/58  Pulse:    Resp: 11 11  Temp:    SpO2: 95%    Last BM Date: 08/29/21    GI:  Lab Results: Recent Labs    08/29/21 0257 08/30/21 0307 08/31/21 0307  WBC 6.5 5.4 5.1  HGB 8.3* 7.6* 9.3*  HCT 27.8* 23.5* 27.9*  PLT 195 175 141*   BMET Recent Labs    08/29/21 0257 08/30/21 0307 08/31/21 0307  NA 132* 131* 132*  K 3.0* 3.5 3.5  CL 100 100 98  CO2 24 27 27   GLUCOSE 90 111* 100*  BUN 10 6* <5*  CREATININE 0.33* 0.33* <0.30*  CALCIUM  7.8* 7.9* 7.8*   LFT Recent Labs    08/29/21 0257  PROT 5.0*  ALBUMIN 1.6*  AST 22  ALT 11  ALKPHOS 166*  BILITOT 0.3  BILIDIR 0.2  IBILI 0.1*   PT/INR No results for input(s): LABPROT, INR in the last 72  hours.   Studies/Results: No results found.  Impression: Dysphagia; like s/p radiation induced stricture - CT venogram abdomen/pelvis 08/27/21: mild circumferential distal esophageal thickening, no signs of perigastric stranding. Possibly esophagitis. - Has been seen by speech who does not think patient has oropharyngeal dysphagia - Has had oral thrush improved on nystatin. No improvement in dysphagia after IV diflucan. - BUN 6, Cr 0.33 - DG esophagus w single CM 11/28: limited due to patient condition. Suspect high grade stricture in the distal esophagus. Poor esophageal motility.  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 I thoroughly discussed the procedures to include nature, alternatives, benefits, and risks including but not limited to bleeding, perforation, infection, anesthesia/cardiac and pulmonary complications. Patient provides understanding and gave verbal consent to proceed.   NPO at midnight  Continue  supportive care as needed.   [facility] will follow.      LOS: 5 days   Mirna Sutcliffe Radford Pax  PA-C 08/31/2021, 7:50 AM  Contact #  [phone]

## 2021-08-31 NOTE — Progress Notes (Signed)
   Daily Progress Note   Patient Name: Kim Ross       Date: 08/31/2021 DOB: [birth year]  Age: 65 y.o. MRN#: [id number] Attending Physician: Shelly Coss, MD Primary Care Physician: Erskine Emery, MD Admit Date: 08/25/2021  Reason for Consultation/Follow-up: Establishing goals of care  Subjective: Chart Reviewed. Updates Received. Patient Assessed.   [name] is resting in bed. Easily awaken.  Weak and chronically-ill appearing. She states she was able to get up out of the bed on yesterday and felt weak. TOC is arranging home health which she is appreciative of.   We reviewed goals of care discussions. She is remaining hopeful for some stability. Knows her health has been a challenge. Continues to try to increase her oral nutrition (observed drinking a boost). SLP evaluated her on yesterday and she also was seen by GI recently and underwent EGD with esophageal dilation. Tolerating Remeron.   She was having some concerns with constipation however is now having more loose stools. Stool softners have been discontinued and she has a rectal tube in place for support (no stool currently observed).   Her goal is to return home with family support and outpatient follow-up.   All questions answered and support provided.   Length of Stay: 5 days  Vital Signs: BP 121/68   Pulse 71   Temp 98.1 F (36.7 C) (Oral)   Resp 12   Ht 5\' 5"  (1.651 m)   Wt 50.8 kg   SpO2 97%   BMI 18.64 kg/m  SpO2: SpO2: 97 % O2 Device: O2 Device: Room Air O2 Flow Rate:    Physical Exam: NAD, frail, thin chronically ill-appearing Diminished bilaterally RRR AAOx3, mood appropriate, generalized weakness             Palliative Care Assessment & Plan  HPI:  Palliative Care consult requested for goals of care discussion in this 65 y.o. female  with past medical history of anxiety, cocaine abuse, depression, diabetes, hyperlipidemia, and stage IV non-small lung cancer (active chemotherapy and  radiation) with adenopathy, and metastatic C7/T1 lesions. She was admitted on 08/25/2021 from the [facility] after receiving chemotherapy found to have palpitations and atrial fibrillation.    Code Status: DNR  Goals of Care/Recommendations: Continue to treat the treatable to allow every opportunity to thrive Ongoing discussions regarding goals of care.  She continues to remain hopeful for some stability. Understands her overall condition with concerns due to poor nutrition. Is tolerating ensure.  Patient is hopeful to return home with home health and family support. Plans for outpatient follow-up.  Discussions regarding poor oral nutrition/failure to thrive.  Patient verbalized understanding.  Would not want any forms of artificial feeding/PEG. PMT will continue to support and follow.    Symptom Management: Constipation  Miralax as needed will hold for diarrhea  Senna as needed    Prognosis: Guarded-poor  Discharge Planning: To Be Determined  Thank you for allowing the Palliative Medicine Team to assist in the care of this patient.  Time Total: 35 min.   Visit consisted of counseling and education dealing with the complex and emotionally intense issues of symptom management and palliative care in the setting of serious and potentially life-threatening illness.Greater than 50%  of this time was spent counseling and coordinating care related to the above assessment and plan.  Alda Lea, AGPCNP-BC  Palliative Medicine Team [phone]
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
## 2021-09-01 ENCOUNTER — Inpatient Hospital Stay (HOSPITAL_COMMUNITY): Payer: Medicare Other | Admitting: Anesthesiology

## 2021-09-01 ENCOUNTER — Encounter (HOSPITAL_COMMUNITY)
Admission: EM | Disposition: A | Discharge status: Skilled Nursing Facility | Payer: Self-pay | Source: Ambulatory Visit | Attending: Internal Medicine

## 2021-09-01 ENCOUNTER — Encounter (HOSPITAL_COMMUNITY): Payer: Self-pay | Admitting: Internal Medicine

## 2021-09-01 DIAGNOSIS — I4891 Unspecified atrial fibrillation: Secondary | ICD-10-CM | POA: Diagnosis not present

## 2021-09-01 HISTORY — PX: ESOPHAGOGASTRODUODENOSCOPY: SHX5428

## 2021-09-01 HISTORY — PX: BALLOON DILATION: SHX5330

## 2021-09-01 LAB — GLUCOSE, CAPILLARY
Glucose-Capillary: 76 mg/dL (ref 70–99)
Glucose-Capillary: 77 mg/dL (ref 70–99)
Glucose-Capillary: 92 mg/dL (ref 70–99)
Glucose-Capillary: 104 mg/dL — ABNORMAL HIGH (ref 70–99)
Glucose-Capillary: 88 mg/dL (ref 70–99)

## 2021-09-01 LAB — BASIC METABOLIC PANEL
Anion gap: 7 (ref 5–15)
BUN: <5 mg/dL — ABNORMAL LOW (ref 8–23)
CO2: 25 mmol/L (ref 22–32)
Calcium: 7.6 mg/dL — ABNORMAL LOW (ref 8.9–10.3)
Chloride: 102 mmol/L (ref 98–111)
Creatinine, Ser: <0.3 mg/dL — ABNORMAL LOW (ref 0.44–1.00)
Glucose, Bld: 90 mg/dL (ref 70–99)
Potassium: 3.4 mmol/L — ABNORMAL LOW (ref 3.5–5.1)
Sodium: 134 mmol/L — ABNORMAL LOW (ref 135–145)

## 2021-09-01 LAB — CBC
HCT: 27.2 % — ABNORMAL LOW (ref 36.0–46.0)
Hemoglobin: 8.6 g/dL — ABNORMAL LOW (ref 12.0–15.0)
MCH: 28.1 pg (ref 26.0–34.0)
MCHC: 31.6 g/dL (ref 30.0–36.0)
MCV: 88.9 fL (ref 80.0–100.0)
Platelets: 117 10*3/uL — ABNORMAL LOW (ref 150–400)
RBC: 3.06 MIL/uL — ABNORMAL LOW (ref 3.87–5.11)
RDW: 19.1 % — ABNORMAL HIGH (ref 11.5–15.5)
WBC: 2.5 10*3/uL — ABNORMAL LOW (ref 4.0–10.5)
nRBC: 0 % (ref 0.0–0.2)

## 2021-09-01 LAB — MAGNESIUM: Magnesium: 1.1 mg/dL — ABNORMAL LOW (ref 1.7–2.4)

## 2021-09-01 LAB — ACTH: C206 ACTH: 16.1 pg/mL (ref 7.2–63.3)

## 2021-09-01 SURGERY — EGD (ESOPHAGOGASTRODUODENOSCOPY)
Anesthesia: Monitor Anesthesia Care

## 2021-09-01 MED ORDER — SODIUM CHLORIDE 0.9 % IV SOLN: INTRAVENOUS | Status: DC

## 2021-09-01 MED ORDER — MAGNESIUM SULFATE 2 GM/50ML IV SOLN
INTRAVENOUS | Status: AC
  Filled 2021-09-01: qty 50

## 2021-09-01 MED ORDER — LIDOCAINE HCL (CARDIAC) PF 100 MG/5ML IV SOSY
PREFILLED_SYRINGE | INTRAVENOUS | Status: DC | PRN
  Administered 2021-09-01: 100 mg via INTRAVENOUS

## 2021-09-01 MED ORDER — POTASSIUM CHLORIDE 10 MEQ/100ML IV SOLN
INTRAVENOUS | Status: AC
  Filled 2021-09-01: qty 100

## 2021-09-01 MED ORDER — PHENYLEPHRINE 40 MCG/ML (10ML) SYRINGE FOR IV PUSH (FOR BLOOD PRESSURE SUPPORT)
PREFILLED_SYRINGE | INTRAVENOUS | Status: DC | PRN
  Administered 2021-09-01: 80 ug via INTRAVENOUS

## 2021-09-01 MED ORDER — MAGNESIUM SULFATE 2 GM/50ML IV SOLN
2.0000 g | Freq: Once | INTRAVENOUS | Status: AC
  Administered 2021-09-01: 2 g via INTRAVENOUS

## 2021-09-01 MED ORDER — POTASSIUM CHLORIDE 10 MEQ/100ML IV SOLN
10.0000 meq | INTRAVENOUS | Status: AC
  Administered 2021-09-01 (×4): 10 meq via INTRAVENOUS

## 2021-09-01 MED ORDER — PROPOFOL 500 MG/50ML IV EMUL
INTRAVENOUS | Status: DC | PRN
  Administered 2021-09-01: 140 ug/kg/min via INTRAVENOUS

## 2021-09-01 MED ORDER — POTASSIUM CHLORIDE 10 MEQ/100ML IV SOLN
INTRAVENOUS | Status: AC
  Filled 2021-09-01: qty 200

## 2021-09-01 MED ORDER — PROPOFOL 10 MG/ML IV BOLUS
INTRAVENOUS | Status: DC | PRN
  Administered 2021-09-01: 40 mg via INTRAVENOUS
  Administered 2021-09-01: 20 mg via INTRAVENOUS

## 2021-09-01 MED ORDER — MAGNESIUM SULFATE 4 GM/100ML IV SOLN: 4.0000 g | Freq: Once | INTRAVENOUS | Status: DC

## 2021-09-01 MED ORDER — LACTATED RINGERS IV SOLN: INTRAVENOUS | Status: DC | PRN

## 2021-09-01 MED ORDER — LACTATED RINGERS IV SOLN
INTRAVENOUS | Status: AC | PRN
  Administered 2021-09-01: 1000 mL via INTRAVENOUS

## 2021-09-01 NOTE — Op Note (Signed)
[facility] Patient Name: Kim Ross Procedure Date: 09/01/2021 MRN: [id number] Attending MD: Clarene Essex , MD Date of Birth: 07-Mar-1956 CSN: [id number] Age: 65 Admit Type: Inpatient Procedure:                Upper GI endoscopy Indications:              Dysphagia, Abnormal cine-esophagram Providers:                Clarene Essex, MD, Burtis Junes, RN, Luan Moore,                            Technician, Stephanie [place] CRNA Referring MD:             Curt Bears Medicines:                Propofol total dose 300 mg IV, 100 mg IV lidocaine Complications:            No immediate complications. Estimated Blood Loss:     Estimated blood loss: none. Procedure:                Pre-Anesthesia Assessment:                           - Prior to the procedure, a History and Physical                            was performed, and patient medications and                            allergies were reviewed. The patient's tolerance of                            previous anesthesia was also reviewed. The risks                            and benefits of the procedure and the sedation                            options and risks were discussed with the patient.                            All questions were answered, and informed consent                            was obtained. Prior Anticoagulants: The patient has                            taken Lovenox (enoxaparin), last dose was 1 day                            prior to procedure. ASA Grade Assessment: III - A                            patient with severe systemic disease. After  reviewing the risks and benefits, the patient was                            deemed in satisfactory condition to undergo the                            procedure.                           After obtaining informed consent, the endoscope was                            passed under direct vision. Throughout the                             procedure, the patient's blood pressure, pulse, and                            oxygen saturations were monitored continuously. The                            [id number]-H190 ([id number]) Olympus endoscope was introduced                            through the mouth, and advanced to the third part                            of duodenum. The upper GI endoscopy was                            accomplished without difficulty. The patient                            tolerated the procedure well. Scope In: Scope Out: Findings:      The larynx was normal.      A small hiatal hernia was present.      One benign-appearing, intrinsic moderate stenosis was found. The       stenosis was traversed. A TTS dilator was passed through the scope.       Dilation with a 12-13.5-15 mm balloon dilator was performed to 15 mm.       The dilation site was examined and showed mild mucosal disruption and       moderate improvement in luminal narrowing.      The entire examined stomach was normal.      The duodenal bulb, first portion of the duodenum, second portion of the       duodenum and third portion of the duodenum were normal.      The exam was otherwise without abnormality. Impression:               - Normal larynx.                           - Small hiatal hernia.                           -  Benign-appearing esophageal stenosis. Dilated.                           - Normal stomach.                           - Normal duodenal bulb, first portion of the                            duodenum, second portion of the duodenum and third                            portion of the duodenum.                           - The examination was otherwise normal.                           - No specimens collected. Moderate Sedation:      Not Applicable - Patient had care per Anesthesia. Recommendation:           - Clear liquid diet today. Slowly advance as                            tolerated to soft solids                            - Continue present medications.                           - Return to GI clinic PRN.                           - Telephone GI clinic if symptomatic PRN. Procedure Code(s):        --- Professional ---                           [phone], Esophagogastroduodenoscopy, flexible,                            transoral; with transendoscopic balloon dilation of                            esophagus (less than 30 mm diameter) Diagnosis Code(s):        --- Professional ---                           K44.9, Diaphragmatic hernia without obstruction or                            gangrene                           K22.2, Esophageal obstruction                           R13.10, Dysphagia, unspecified  R93.3, Abnormal findings on diagnostic imaging of                            other parts of digestive tract CPT copyright 2019 American Medical Association. All rights reserved. The codes documented in this report are preliminary and upon coder review may  be revised to meet current compliance requirements. Clarene Essex, MD 09/01/2021 10:57:22 AM This report has been signed electronically. Number of Addenda: 0

## 2021-09-01 NOTE — Progress Notes (Signed)
Kim Ross 9:50 AM  Subjective: Patient without any new complaints and we rediscussed the procedure  Objective: Vital signs stable afebrile exam please see preassessment evaluation labs stable  Assessment: Dysphagia abnormal barium swallow and CT scan  Plan: Okay to proceed with endoscopy and possible dilation with anesthesia assistance  [facility]  office [phone] After 5PM or if no answer call [phone]
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
## 2021-09-01 NOTE — Anesthesia Postprocedure Evaluation (Signed)
Anesthesia Post Note  Patient: Kim Ross  Procedure(s) Performed: ESOPHAGOGASTRODUODENOSCOPY (EGD) BALLOON DILATION     Patient location during evaluation: Endoscopy Anesthesia Type: MAC Level of consciousness: awake Pain management: pain level controlled Vital Signs Assessment: post-procedure vital signs reviewed and stable Respiratory status: spontaneous breathing Cardiovascular status: stable Postop Assessment: no apparent nausea or vomiting Anesthetic complications: no   No notable events documented.  Last Vitals:  Vitals:   09/01/21 1056 09/01/21 1100  BP:  (!) 115/53  Pulse: 87 88  Resp: 18 17  Temp:    SpO2: 100% 100%    Last Pain:  Vitals:   09/01/21 1100  TempSrc:   PainSc: 0-No pain                 Tyqwan Pink

## 2021-09-01 NOTE — TOC Progression Note (Addendum)
Transition of Care [facility]) - Progression Note    Patient Details  Name: Kim Ross MRN: [id number] Date of Birth: 1956/04/15  Transition of Care [facility] CM/SW Contact  Ross Ludwig, [place] Phone Number: 09/01/2021 4:35pm  Clinical Narrative:    CSW continuing to follow patient's progress throughout discharge planning.  Current plan is to return home with home health if possible.  Palliative team following patient.   Expected Discharge Plan: Home/Self Care Barriers to Discharge: Continued Medical Work up  Expected Discharge Plan and Services Expected Discharge Plan: Home/Self Care   Discharge Planning Services: CM Consult   Living arrangements for the past 2 months: Single Family Home                                       Social Determinants of Health (SDOH) Interventions    Readmission Risk Interventions No flowsheet data found.

## 2021-09-01 NOTE — Transfer of Care (Signed)
Immediate Anesthesia Transfer of Care Note  Patient: Kim Ross  Procedure(s) Performed: ESOPHAGOGASTRODUODENOSCOPY (EGD) BALLOON DILATION  Patient Location: PACU and Endoscopy Unit  Anesthesia Type:MAC  Level of Consciousness: awake and alert   Airway & Oxygen Therapy: Patient Spontanous Breathing and Patient connected to face mask oxygen  Post-op Assessment: Report given to RN and Post -op Vital signs reviewed and stable  Post vital signs: Reviewed and stable  Last Vitals:  Vitals Value Taken Time  BP 105/52 09/01/21 1048  Temp    Pulse 88 09/01/21 1050  Resp 23 09/01/21 1050  SpO2 100 % 09/01/21 1050  Vitals shown include unvalidated device data.  Last Pain:  Vitals:   09/01/21 0920  TempSrc: Axillary  PainSc: 0-No pain      Patients Stated Pain Goal: 2 (21/30/86 5784)  Complications: No notable events documented.

## 2021-09-01 NOTE — Anesthesia Preprocedure Evaluation (Addendum)
Anesthesia Evaluation  Patient identified by MRN, date of birth, ID band Patient awake    Reviewed: Allergy & Precautions, NPO status , Patient's Chart, lab work & pertinent test results  Airway Mallampati: II  TM Distance: >3 FB     Dental   Pulmonary shortness of breath, pneumonia, Current Smoker and Patient abstained from smoking.,    breath sounds clear to auscultation       Cardiovascular + dysrhythmias Atrial Fibrillation  Rhythm:Regular Rate:Normal  History noted Dr. Nyoka Cowden   Neuro/Psych    GI/Hepatic Neg liver ROS, History noted Dr. Nyoka Cowden   Endo/Other  diabetes  Renal/GU      Musculoskeletal   Abdominal   Peds  Hematology   Anesthesia Other Findings   Reproductive/Obstetrics                            Anesthesia Physical Anesthesia Plan  ASA: 3  Anesthesia Plan: MAC   Post-op Pain Management:    Induction: Intravenous  PONV Risk Score and Plan: 1 and Ondansetron, Dexamethasone and Midazolam  Airway Management Planned: Nasal Cannula and Simple Face Mask  Additional Equipment:   Intra-op Plan:   Post-operative Plan:   Informed Consent: I have reviewed the patients History and Physical, chart, labs and discussed the procedure including the risks, benefits and alternatives for the proposed anesthesia with the patient or authorized representative who has indicated his/her understanding and acceptance.     Dental advisory given  Plan Discussed with: CRNA and Anesthesiologist  Anesthesia Plan Comments:         Anesthesia Quick Evaluation

## 2021-09-02 ENCOUNTER — Ambulatory Visit: Payer: Self-pay | Admitting: Radiation Oncology

## 2021-09-02 ENCOUNTER — Inpatient Hospital Stay: Payer: 59

## 2021-09-02 ENCOUNTER — Ambulatory Visit: Payer: 59 | Admitting: Radiation Oncology

## 2021-09-02 ENCOUNTER — Encounter: Payer: Self-pay | Admitting: Radiation Oncology

## 2021-09-02 DIAGNOSIS — T451X5A Adverse effect of antineoplastic and immunosuppressive drugs, initial encounter: Secondary | ICD-10-CM | POA: Diagnosis not present

## 2021-09-02 DIAGNOSIS — I4891 Unspecified atrial fibrillation: Secondary | ICD-10-CM | POA: Diagnosis not present

## 2021-09-02 DIAGNOSIS — R1310 Dysphagia, unspecified: Secondary | ICD-10-CM | POA: Diagnosis not present

## 2021-09-02 DIAGNOSIS — C3492 Malignant neoplasm of unspecified part of left bronchus or lung: Secondary | ICD-10-CM | POA: Diagnosis not present

## 2021-09-02 DIAGNOSIS — I480 Paroxysmal atrial fibrillation: Principal | ICD-10-CM

## 2021-09-02 DIAGNOSIS — D696 Thrombocytopenia, unspecified: Secondary | ICD-10-CM | POA: Diagnosis not present

## 2021-09-02 DIAGNOSIS — D701 Agranulocytosis secondary to cancer chemotherapy: Secondary | ICD-10-CM

## 2021-09-02 LAB — GLUCOSE, CAPILLARY
Glucose-Capillary: 74 mg/dL (ref 70–99)
Glucose-Capillary: 99 mg/dL (ref 70–99)
Glucose-Capillary: 75 mg/dL (ref 70–99)
Glucose-Capillary: 84 mg/dL (ref 70–99)

## 2021-09-02 LAB — CBC WITH DIFFERENTIAL/PLATELET
Abs Immature Granulocytes: 0.01 10*3/uL (ref 0.00–0.07)
Basophils Absolute: 0 10*3/uL (ref 0.0–0.1)
Basophils Relative: 1 %
Eosinophils Absolute: 0 10*3/uL (ref 0.0–0.5)
Eosinophils Relative: 3 %
HCT: 25.8 % — ABNORMAL LOW (ref 36.0–46.0)
Hemoglobin: 8.1 g/dL — ABNORMAL LOW (ref 12.0–15.0)
Immature Granulocytes: 1 %
Lymphocytes Relative: 30 %
Lymphs Abs: 0.3 10*3/uL — ABNORMAL LOW (ref 0.7–4.0)
MCH: 28 pg (ref 26.0–34.0)
MCHC: 31.4 g/dL (ref 30.0–36.0)
MCV: 89.3 fL (ref 80.0–100.0)
Monocytes Absolute: 0 10*3/uL — ABNORMAL LOW (ref 0.1–1.0)
Monocytes Relative: 4 %
Neutro Abs: 0.7 10*3/uL — ABNORMAL LOW (ref 1.7–7.7)
Neutrophils Relative %: 61 %
Platelets: 72 10*3/uL — ABNORMAL LOW (ref 150–400)
RBC: 2.89 MIL/uL — ABNORMAL LOW (ref 3.87–5.11)
RDW: 18.7 % — ABNORMAL HIGH (ref 11.5–15.5)
WBC: 1.1 10*3/uL — CL (ref 4.0–10.5)
nRBC: 0 % (ref 0.0–0.2)

## 2021-09-02 LAB — BASIC METABOLIC PANEL
Anion gap: 5 (ref 5–15)
BUN: 5 mg/dL — ABNORMAL LOW (ref 8–23)
CO2: 25 mmol/L (ref 22–32)
Calcium: 7.4 mg/dL — ABNORMAL LOW (ref 8.9–10.3)
Chloride: 101 mmol/L (ref 98–111)
Creatinine, Ser: 0.35 mg/dL — ABNORMAL LOW (ref 0.44–1.00)
GFR, Estimated: >60 mL/min (ref >60)
Glucose, Bld: 72 mg/dL (ref 70–99)
Potassium: 3.7 mmol/L (ref 3.5–5.1)
Sodium: 131 mmol/L — ABNORMAL LOW (ref 135–145)

## 2021-09-02 LAB — MAGNESIUM: Magnesium: 1.3 mg/dL — ABNORMAL LOW (ref 1.7–2.4)

## 2021-09-02 MED ORDER — MAGNESIUM OXIDE -MG SUPPLEMENT 400 (240 MG) MG PO TABS
800.0000 mg | ORAL_TABLET | Freq: Once | ORAL | Status: AC
Start: 2021-09-02 — End: 2021-09-02
  Administered 2021-09-02: 800 mg via ORAL
  Filled 2021-09-02: qty 2

## 2021-09-02 MED ORDER — AMIODARONE HCL 200 MG PO TABS
200.0000 mg | ORAL_TABLET | Freq: Two times a day (BID) | ORAL | Status: DC
  Administered 2021-09-02 – 2021-09-14 (×25): 200 mg via ORAL
  Filled 2021-09-02 (×25): qty 1

## 2021-09-02 MED ORDER — MAGNESIUM SULFATE 2 GM/50ML IV SOLN: 2.0000 g | Freq: Once | INTRAVENOUS | Status: DC

## 2021-09-02 MED ORDER — TBO-FILGRASTIM 300 MCG/0.5ML ~~LOC~~ SOSY
300.0000 ug | PREFILLED_SYRINGE | Freq: Once | SUBCUTANEOUS | Status: AC
  Administered 2021-09-02: 300 ug via SUBCUTANEOUS
  Filled 2021-09-02: qty 0.5

## 2021-09-02 MED ORDER — TBO-FILGRASTIM 480 MCG/0.8ML ~~LOC~~ SOSY: 480.0000 ug | PREFILLED_SYRINGE | Freq: Once | SUBCUTANEOUS | Status: DC

## 2021-09-02 MED ORDER — MECLIZINE HCL 25 MG PO TABS
25.0000 mg | ORAL_TABLET | Freq: Two times a day (BID) | ORAL | Status: DC | PRN
  Administered 2021-09-02: 25 mg via ORAL
  Filled 2021-09-02 (×2): qty 1

## 2021-09-02 NOTE — Telephone Encounter (Signed)
Kim Ross was r/s for the third time for the education class today. Unfortunately she is currently admitted.  Will not r/s the class at this time. Dr. Worthy Flank nurse Abelina Bachelor notified.

## 2021-09-02 NOTE — Progress Notes (Addendum)
[facility] Progress Note  Kim Ross 65 y.o. 05/11/1956  CC:  Dysphagia   Subjective: Patient states she had trouble eating her dinner yesterday. Felt like she was choking on it, it was getting stuck again. Reports vomiting last night. Denies hematemesis or coffee ground emesis. She states she is doing okay with ice chips, but is nervous to continue full diet. Has increased flatus and states her Bms have been difficult to pass. Denis abdominal pain.  ROS : Review of Systems  Gastrointestinal:  Positive for constipation, nausea and vomiting. Negative for abdominal pain, blood in stool, diarrhea, heartburn and melena.  Genitourinary:  Negative for dysuria and urgency.     Objective: Vital signs in last 24 hours: Vitals:   09/02/21 0746 09/02/21 0800  BP:  128/66  Pulse:  93  Resp:  18  Temp: 98 F (36.7 C)   SpO2:  96%    Physical Exam:  General:  Alert, cooperative, no distress, husband at bedside  Head:  Normocephalic, without obvious abnormality, atraumatic  Eyes:  Anicteric sclera, EOM's intact, conjunctival pallor  Lungs:   Clear to auscultation bilaterally, respirations unlabored  Heart:  Regular rate and rhythm, S1, S2 normal  Abdomen:   Soft, non-tender, bowel sounds active all four quadrants,  no masses,     Lab Results: Recent Labs    09/01/21 0250 09/02/21 0302  NA 134* 131*  K 3.4* 3.7  CL 102 101  CO2 25 25  GLUCOSE 90 72  BUN <5* 5*  CREATININE <0.30* 0.35*  CALCIUM 7.6* 7.4*  MG 1.1* 1.3*   No results for input(s): AST, ALT, ALKPHOS, BILITOT, PROT, ALBUMIN in the last 72 hours. Recent Labs    09/01/21 0250 09/02/21 0302  WBC 2.5* 1.1*  NEUTROABS  --  0.7*  HGB 8.6* 8.1*  HCT 27.2* 25.8*  MCV 88.9 89.3  PLT 117* 72*   No results for input(s): LABPROT, INR in the last 72 hours.    Assessment Dysphagia; like s/p radiation induced stricture - CT venogram abdomen/pelvis 08/27/21: mild circumferential distal esophageal  thickening, no signs of perigastric stranding. Possibly esophagitis. - Has been seen by speech who does not think patient has oropharyngeal dysphagia - Has had oral thrush improved on nystatin. No improvement in dysphagia after IV diflucan. - BUN 6, Cr 0.33 - DG esophagus w single CM 11/28: limited due to patient condition. Suspect high grade stricture in the distal esophagus. Poor esophageal motility. - EGD 11/29: normal larynx, small hiatal hernia, benign-appearing esophageal stenosis that was dilated, normal stomach and duodenal bulb. No specimens collected.   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 This is likely secondary to stage 4 adenocarcinoma and not feeling up to eating. Continue to try to advance diet as tolerated. Continue protonix 40mg  IV BID Can consider possible repeat barium swallow in the future though will likely be low yield as EGD with dilation was performed yesterday. With repeated radiation treatments, possible stricture may return though likely has not returned this soon. [facility] will follow.  Garnette Scheuermann PA-C 09/02/2021, 8:49 AM  Contact #  [phone]

## 2021-09-02 NOTE — Progress Notes (Addendum)
Alexis from the lab called in a critical WBC 1.1.    Critical value reported to Gershon Cull NP.

## 2021-09-02 NOTE — Evaluation (Signed)
SLP Cancellation Note  Patient Details Name: Kim Ross MRN: [id number] DOB: July 09, 1956   Cancelled treatment:       Reason Eval/Treat Not Completed: Other (comment) (Re-consult order received for diet advancement, SLP will see pt on 09/03/2021. Appears pt is swallowing better per notes from GI.  Thanks.)  [name], MS [facility] SLP Acute Rehab Services Office [phone] Pager [phone]   [name] 09/02/2021, 6:39 PM
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
## 2021-09-02 NOTE — Progress Notes (Signed)
Progress Note  Patient Name: Kim Ross Date of Encounter: 09/02/2021  [facility] Cardiologist: Skeet Latch, MD   Subjective   Feels weak, short of breath when she transfers. Continues to struggle with swallowing.  Inpatient Medications    Scheduled Meds:  amiodarone  200 mg Oral BID   atorvastatin  40 mg Oral Daily   Chlorhexidine Gluconate Cloth  6 each Topical Daily   Chlorhexidine Gluconate Cloth  6 each Topical Q0600   enoxaparin (LOVENOX) injection  50 mg Subcutaneous BID   feeding supplement  237 mL Oral TID BM   fentaNYL  1 patch Transdermal Q00Q   folic acid  1 mg Oral Daily   insulin aspart  0-9 Units Subcutaneous TID WC   mouth rinse  15 mL Mouth Rinse BID   melatonin  3 mg Oral QHS   midodrine  2.5 mg Oral TID WC   mirtazapine  15 mg Oral QHS   multivitamin with minerals  1 tablet Oral QODAY   nystatin  5 mL Oral QID   pantoprazole (PROTONIX) IV  40 mg Intravenous Q12H   polyethylene glycol  17 g Oral BID   senna-docusate  1 tablet Oral BID   sertraline  50 mg Oral Daily   tamsulosin  0.4 mg Oral QPC supper   Tbo-filgastrim (GRANIX) SQ  300 mcg Subcutaneous ONCE-1800   zinc oxide   Topical TID   Continuous Infusions:   PRN Meds: docusate sodium, HYDROcodone-acetaminophen, lactulose, meclizine, mineral oil-hydrophilic petrolatum, ondansetron (ZOFRAN) IV   Vital Signs    Vitals:   09/02/21 0800 09/02/21 0900 09/02/21 1000 09/02/21 1133  BP: 128/66 132/69 115/77   Pulse: 93 95 93   Resp: $Remo'18 18 15   'DOtIz$ Temp:    (!) 97.4 F (36.3 C)  TempSrc:    Oral  SpO2: 96% 90% 96%   Weight:      Height:        Intake/Output Summary (Last 24 hours) at 09/02/2021 1138 Last data filed at 09/02/2021 6761 Gross per 24 hour  Intake 1361.5 ml  Output 1950 ml  Net -588.5 ml   Last 3 Weights 09/01/2021 08/28/2021 08/27/2021  Weight (lbs) 111 lb 15.9 oz 111 lb 15.9 oz 116 lb 10 oz  Weight (kg) 50.8 kg 50.8 kg 52.9 kg      Telemetry    Has been in  SR, last afib 11/27 around 12 PM - Personally Reviewed  ECG    No new since 08/25/21 - Personally Reviewed  Physical Exam   GEN: Cachectic, frail appearing woman  Neck: No JVD Cardiac: RRR, no murmurs, rubs, or gallops.  Respiratory: Clear to auscultation bilaterally. GI: Soft, nontender, non-distended  MS: No edema; No deformity. Neuro:  Nonfocal  Psych: Normal affect   Labs    High Sensitivity Troponin:   Recent Labs  Lab 08/25/21 1620 08/25/21 1800  TROPONINIHS 8 7     Chemistry Recent Labs  Lab 08/26/21 2015 08/27/21 0400 08/29/21 0257 08/30/21 0307 08/31/21 0307 09/01/21 0250 09/02/21 0302  NA  --    < > 132*   < > 132* 134* 131*  K  --    < > 3.0*   < > 3.5 3.4* 3.7  CL  --    < > 100   < > 98 102 101  CO2  --    < > 24   < > $R'27 25 25  'kN$ GLUCOSE  --    < > 90   < >  100* 90 72  BUN  --    < > 10   < > <5* <5* 5*  CREATININE  --    < > 0.33*   < > <0.30* <0.30* 0.35*  CALCIUM  --    < > 7.8*   < > 7.8* 7.6* 7.4*  MG  --    < > 1.6*   < > 1.7 1.1* 1.3*  PROT  --   --  5.0*  --   --   --   --   ALBUMIN 2.1*  --  1.6*  --   --   --   --   AST  --   --  22  --   --   --   --   ALT  --   --  11  --   --   --   --   ALKPHOS  --   --  166*  --   --   --   --   BILITOT  --   --  0.3  --   --   --   --   GFRNONAA  --    < > >60   < > NOT CALCULATED NOT CALCULATED >60  ANIONGAP  --    < > 8   < > $R'7 7 5   'Ka$ < > = values in this interval not displayed.    Lipids No results for input(s): CHOL, TRIG, HDL, LABVLDL, LDLCALC, CHOLHDL in the last 168 hours.  Hematology Recent Labs  Lab 08/31/21 0307 09/01/21 0250 09/02/21 0302  WBC 5.1 2.5* 1.1*  RBC 3.21* 3.06* 2.89*  HGB 9.3* 8.6* 8.1*  HCT 27.9* 27.2* 25.8*  MCV 86.9 88.9 89.3  MCH 29.0 28.1 28.0  MCHC 33.3 31.6 31.4  RDW 19.0* 19.1* 18.7*  PLT 141* 117* 72*   Thyroid No results for input(s): TSH, FREET4 in the last 168 hours.  BNPNo results for input(s): BNP, PROBNP in the last 168 hours.  DDimer No results  for input(s): DDIMER in the last 168 hours.   Radiology    No results found.  Cardiac Studies   Echo 08/26/21  1. Left ventricular ejection fraction, by estimation, is 55 to 60%. The  left ventricle has normal function. The left ventricle has no regional  wall motion abnormalities.   2. Right ventricular systolic function is normal. The right ventricular  size is normal. There is normal pulmonary artery systolic pressure. The  estimated right ventricular systolic pressure is 94.0 mmHg.   3. The mitral valve is normal in structure. Trivial mitral valve  regurgitation. No evidence of mitral stenosis.   4. The aortic valve was not well visualized. Aortic valve regurgitation  is not visualized. No aortic stenosis is present.   5. The inferior vena cava is normal in size with greater than 50%  respiratory variability, suggesting right atrial pressure of 3 mmHg.  Patient Profile     65 y.o. 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 Re-consulted for recommendations.  Assessment & Plan    Paroxysmal atrial fibrillation with RVR, now in sinus -seen by Dr. Stanford Breed earlier this admission -chadsvasc=1 for gender, not recommended for anticoagulation based on score. Elevated risk given brain metastasis as well -initially on metoprolol, but went back into afib RVR and started on amiodarone. Beta blocker held, now on midodrine for hypotension -having  dysphagia, was on IV amiodarone. Will transition from IV amiodarone to oral. Would load with 200 mg BID amiodarone for 2 weeks (last date 12/13), then 200 mg daily long term. I believe that this can be crushed if she is not swallowing pills easily.  Dysphagia -EGD, followed by [facility], s/p esophageal dilation 09/01/21 but still with symptoms, returned to clear liquid diet  Stage 4 adenocarcinoma of the lung, with brain  met, lytic lesion on C7-T1 Pleural effusion, likely malignant Failure to thrive -per primary team and oncology  [facility] will sign off.   Medication Recommendations:  changing to oral amiodarone today. Continue 200 mg BID until 09/15/21, then change to 200 mg daily long term. Other recommendations (labs, testing, etc):  none Follow up as an outpatient:  Has appt scheduled with Laurann Montana on 09/21/21  For questions or updates, please contact [place] Please consult [email or web address] for contact info under     Signed, Buford Dresser, MD  09/02/2021, 11:38 AM

## 2021-09-02 NOTE — TOC Progression Note (Signed)
Transition of Care [facility]) - Progression Note    Patient Details  Name: Kim Ross MRN: [id number] Date of Birth: 11-14-1955  [facility]) CM/SW Contact  Leeroy Cha, RN Phone Number: 09/02/2021, 2:35 PM  Clinical Narrative:    [facility] pt orders sent to [facility] for review   Expected Discharge Plan: Home/Self Care Barriers to Discharge: Continued Medical Work up  Expected Discharge Plan and Services Expected Discharge Plan: Home/Self Care   Discharge Planning Services: CM Consult   Living arrangements for the past 2 months: Single Family Home                                       Social Determinants of Health (SDOH) Interventions    Readmission Risk Interventions No flowsheet data found.

## 2021-09-02 NOTE — Progress Notes (Signed)
Physical Therapy Treatment Patient Details Name: Kim Ross MRN: [id number] DOB: May 05, 1956 Today's Date: 09/02/2021   History of Present Illness 65yo female who presented on 11/22 after having palpitations while at [facility] center; found to be in Afib with RVR and transferred to ED. PE negative but did have pleural effusion, addressed with thoracentesis on 11/23. PMH cocaine abuse, UTI, DM, stage IV lung CA with brain met on chemo    PT Comments    Patient encouraged to get to recliner. Patient having BM. Mod assist to sit up, then mod assistance to stand  From bed, BSC and recliner. Much support to steady. Did stand at [facility] once for transfer. Subsequent transfers to pivot , patient very weak. Patient having continuous loose BM's. RN in room assisting.  HR up to 124, SPO2 on RA > 90%, patient reported feeling weak and SOB. Patient tearful at times. Continue PT as tolerated.  Recommendations for follow up therapy are one component of a multi-disciplinary discharge planning process, led by the attending physician.  Recommendations may be updated based on patient status, additional functional criteria and insurance authorization.  Follow Up Recommendations  Home health PT     Assistance Recommended at Discharge Frequent or constant Supervision/Assistance  Equipment Recommendations  Wheelchair (measurements PT);Wheelchair cushion (measurements PT)    Recommendations for Other Services       Precautions / Restrictions Precautions Precautions: Fall Precaution Comments: high fracture risk due CA, L LE pain, watch vitals,     Mobility  Bed Mobility   Bed Mobility: Rolling;Sidelying to Sit Rolling: Min guard Sidelying to sit: Min assist       General bed mobility comments: increased time and effort, VCs for technique, assistance  with trunk to sitting, patient very frail    Transfers Overall transfer level: Needs assistance Equipment used: Rolling walker (2 wheels) Transfers: Sit  to/from Stand;Bed to chair/wheelchair/BSC Sit to Stand: Mod assist;+2 safety/equipment Stand pivot transfers: Mod assist;+2 safety/equipment   Step pivot transfers: Mod assist     General transfer comment: patient requires mod assistance to power up from bed and recliner and BSC. Patient pivot stepped to [facility] then to reclienr and back to [facility] due to ongoing loose BM.    Ambulation/Gait                   Stairs             Wheelchair Mobility    Modified Rankin (Stroke Patients Only)       Balance Overall balance assessment: Needs assistance Sitting-balance support: Bilateral upper extremity supported;Feet supported Sitting balance-Leahy Scale: Fair Sitting balance - Comments: tends to prop to right UE Postural control: Right lateral lean Standing balance support: Bilateral upper extremity supported;During functional activity;Reliant on assistive device for balance Standing balance-Leahy Scale: Poor Standing balance comment: reliant on  support of therapist, especially standing for hygiene.                            Cognition Arousal/Alertness: Awake/alert Behavior During Therapy: Flat affect;WFL for tasks assessed/performed                                   General Comments: crying at times,        Exercises      General Comments        Pertinent Vitals/Pain Faces Pain Scale: Hurts whole  lot Pain Location: abdomen Pain Descriptors / Indicators: Cramping Pain Intervention(s): Limited activity within patient's tolerance;Monitored during session;RN gave pain meds during session    Home Living                          Prior Function            PT Goals (current goals can now be found in the care plan section) Progress towards PT goals: Not progressing toward goals - comment (much weaker, mod assistnace required)    Frequency    Min 3X/week      PT Plan Current plan remains appropriate     Co-evaluation              AM-PAC PT "6 Clicks" Mobility   Outcome Measure  Help needed turning from your back to your side while in a flat bed without using bedrails?: A Lot Help needed moving from lying on your back to sitting on the side of a flat bed without using bedrails?: A Lot Help needed moving to and from a bed to a chair (including a wheelchair)?: A Lot Help needed standing up from a chair using your arms (e.g., wheelchair or bedside chair)?: A Lot Help needed to walk in hospital room?: Total Help needed climbing 3-5 steps with a railing? : Total 6 Click Score: 10    End of Session   Activity Tolerance: Patient limited by fatigue Patient left: with nursing/sitter in room (on [facility]) Nurse Communication: Mobility status PT Visit Diagnosis: Unsteadiness on feet (R26.81);Muscle weakness (generalized) (M62.81);Difficulty in walking, not elsewhere classified (R26.2);Pain Pain - part of body: Leg     Time: 1000-1045 PT Time Calculation (min) (ACUTE ONLY): 45 min  Charges:  $Therapeutic Activity: 23-37 mins $Self Care/Home Management: [place] Pager [phone] Office [phone]    [name] 09/02/2021, 1:23 PM

## 2021-09-03 ENCOUNTER — Encounter (HOSPITAL_COMMUNITY): Payer: Self-pay | Admitting: Gastroenterology

## 2021-09-03 DIAGNOSIS — E43 Unspecified severe protein-calorie malnutrition: Secondary | ICD-10-CM | POA: Diagnosis not present

## 2021-09-03 DIAGNOSIS — C3492 Malignant neoplasm of unspecified part of left bronchus or lung: Secondary | ICD-10-CM | POA: Diagnosis not present

## 2021-09-03 DIAGNOSIS — R52 Pain, unspecified: Secondary | ICD-10-CM | POA: Diagnosis not present

## 2021-09-03 DIAGNOSIS — I4891 Unspecified atrial fibrillation: Secondary | ICD-10-CM | POA: Diagnosis not present

## 2021-09-03 LAB — GLUCOSE, CAPILLARY
Glucose-Capillary: 57 mg/dL — ABNORMAL LOW (ref 70–99)
Glucose-Capillary: 70 mg/dL (ref 70–99)
Glucose-Capillary: 83 mg/dL (ref 70–99)
Glucose-Capillary: 71 mg/dL (ref 70–99)
Glucose-Capillary: 76 mg/dL (ref 70–99)

## 2021-09-03 LAB — CBC WITH DIFFERENTIAL/PLATELET
Abs Immature Granulocytes: 0.04 10*3/uL (ref 0.00–0.07)
Basophils Absolute: 0 10*3/uL (ref 0.0–0.1)
Basophils Relative: 1 %
Eosinophils Absolute: 0 10*3/uL (ref 0.0–0.5)
Eosinophils Relative: 2 %
HCT: 26.1 % — ABNORMAL LOW (ref 36.0–46.0)
Hemoglobin: 8.5 g/dL — ABNORMAL LOW (ref 12.0–15.0)
Immature Granulocytes: 3 %
Lymphocytes Relative: 22 %
Lymphs Abs: 0.3 10*3/uL — ABNORMAL LOW (ref 0.7–4.0)
MCH: 28.3 pg (ref 26.0–34.0)
MCHC: 32.6 g/dL (ref 30.0–36.0)
MCV: 87 fL (ref 80.0–100.0)
Monocytes Absolute: 0.1 10*3/uL (ref 0.1–1.0)
Monocytes Relative: 8 %
Neutro Abs: 0.9 10*3/uL — ABNORMAL LOW (ref 1.7–7.7)
Neutrophils Relative %: 64 %
Platelets: 47 10*3/uL — ABNORMAL LOW (ref 150–400)
RBC: 3 MIL/uL — ABNORMAL LOW (ref 3.87–5.11)
RDW: 18.4 % — ABNORMAL HIGH (ref 11.5–15.5)
WBC: 1.3 10*3/uL — CL (ref 4.0–10.5)
nRBC: 0 % (ref 0.0–0.2)

## 2021-09-03 LAB — MAGNESIUM: Magnesium: 1.1 mg/dL — ABNORMAL LOW (ref 1.7–2.4)

## 2021-09-03 LAB — TECHNOLOGIST SMEAR REVIEW

## 2021-09-03 MED ORDER — TBO-FILGRASTIM 300 MCG/0.5ML ~~LOC~~ SOSY
300.0000 ug | PREFILLED_SYRINGE | Freq: Every day | SUBCUTANEOUS | Status: AC
  Administered 2021-09-03 – 2021-09-06 (×4): 300 ug via SUBCUTANEOUS
  Filled 2021-09-03 (×4): qty 0.5

## 2021-09-03 MED ORDER — MAGNESIUM SULFATE 4 GM/100ML IV SOLN
4.0000 g | Freq: Once | INTRAVENOUS | Status: AC
  Administered 2021-09-03: 4 g via INTRAVENOUS
  Filled 2021-09-03: qty 100

## 2021-09-03 MED ORDER — SODIUM CHLORIDE 0.9 % IV SOLN: INTRAVENOUS | Status: DC | PRN

## 2021-09-03 MED ORDER — MAGIC MOUTHWASH
5.0000 mL | Freq: Four times a day (QID) | ORAL | Status: DC
  Administered 2021-09-03 – 2021-09-28 (×80): 5 mL via ORAL
  Filled 2021-09-03 (×102): qty 5

## 2021-09-03 MED ORDER — TBO-FILGRASTIM 300 MCG/0.5ML ~~LOC~~ SOSY: 300.0000 ug | PREFILLED_SYRINGE | Freq: Once | SUBCUTANEOUS | Status: DC

## 2021-09-03 MED ORDER — CHLORHEXIDINE GLUCONATE CLOTH 2 % EX PADS
6.0000 | MEDICATED_PAD | Freq: Every day | CUTANEOUS | Status: DC
  Administered 2021-09-03 – 2021-09-28 (×22): 6 via TOPICAL

## 2021-09-03 MED ORDER — CHLORHEXIDINE GLUCONATE CLOTH 2 % EX PADS
6.0000 | MEDICATED_PAD | Freq: Every day | CUTANEOUS | Status: DC
Start: 2021-09-03 — End: 2021-09-03

## 2021-09-03 NOTE — Progress Notes (Signed)
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    2.50 cm 1.52 cm/m  AORTIC VALVE LVOT Vmax:   80.60 cm/s LVOT Vmean:  49.700 cm/s LVOT VTI:    0.141 m  AORTA Ao Root diam: 2.80 cm MITRAL VALVE               TRICUSPID VALVE MV Area (PHT): 5.02 cm    TR Peak grad:   30.9 mmHg MV Decel Time: 151 msec    TR Vmax:        278.00 cm/s MV E velocity: 87.00 cm/s MV A velocity: 56.60 cm/s  SHUNTS MV E/A ratio:  1.54        Systemic VTI:  0.14 m                            Systemic Diam: 1.70 cm Oswaldo Milian MD Electronically signed by Oswaldo Milian MD Signature Date/Time: 08/26/2021/4:29:54 PM    Final    DG ESOPHAGUS W SINGLE CM (SOL OR THIN BA)  Result Date: 08/31/2021 CLINICAL DATA:  Trouble in pain with swallowing for 3-4 months. EXAM: ESOPHAGUS/BARIUM SWALLOW/TABLET STUDY TECHNIQUE: Initial scout AP supine abdominal image obtained to insure adequate colon cleansing. Barium was introduced into the colon in a retrograde fashion and refluxed from the rectum to the cecum.  Spot images of the colon followed by overhead radiographs were obtained. FLUOROSCOPY TIME:  Fluoroscopy Time:  2 minutes 18 seconds Radiation Exposure Index (if provided by the fluoroscopic device): 8.5 mGy Number of Acquired Spot Images: 0 COMPARISON:  None. FINDINGS: A very limited examination was performed in the recumbent LPO position, due to patient condition. Patient had difficulty swallowing a significant amount of contrast. Poor esophageal motility. Suspect a high-grade distal esophageal stricture, best seen on cine imaging. IMPRESSION: 1. Very limited exam performed due to patient condition. 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 2. Poor esophageal motility. Electronically Signed   By: Lorin Picket M.D.   On: 08/31/2021 09:19   US THORACENTESIS ASP PLEURAL SPACE W/IMG GUIDE  Result Date: 08/26/2021 INDICATION: Patient with history of stage IV non-small cell lung cancer, left pleural effusion; request received for diagnostic and therapeutic left thoracentesis. EXAM: ULTRASOUND GUIDED DIAGNOSTIC AND THERAPEUTIC LEFT THORACENTESIS MEDICATIONS: 10 mL 1% lidocaine COMPLICATIONS: None immediate. PROCEDURE: An ultrasound guided thoracentesis was thoroughly discussed with the patient and questions answered. The benefits, risks, alternatives and complications were also discussed.  The patient understands and wishes to proceed with the procedure. Written consent was obtained. Ultrasound was performed to localize and mark an adequate pocket of fluid in the left chest. The area was then prepped and draped in the normal sterile fashion. 1% Lidocaine was used for local anesthesia. Under ultrasound guidance a 6 Fr Safe-T-Centesis catheter was introduced. Thoracentesis was performed. The catheter was removed and a dressing applied. FINDINGS: A total of approximately 320 cc of yellow fluid was removed. Samples were sent to the laboratory as requested by the clinical team.  IMPRESSION: Successful ultrasound guided diagnostic and therapeutic left thoracentesis yielding 320 cc of pleural fluid. Read by: Rowe Robert, PA-C Electronically Signed   By: Miachel Roux M.D.   On: 08/26/2021 16:19    ASSESSMENT AND PLAN: This is a 65 year old female with stage IV non-small cell lung cancer, adenosquamous carcinoma who was initially diagnosed with a large cavitary left lower lobe lung mass, cavitary mass in the mediastinum, and left upper lobe nodule in addition to pleuritic mass with pleural and lytic bone lesions.  She was diagnosed in September 2022.  She is now receiving palliative systemic chemotherapy with carboplatin for an AUC of 5, Alimta 500 mg per metered squared, and Keytruda 200 mg IV every 3 weeks.  Status post 3 cycles.  Last cycle was given on 08/25/2021.    During her last chemotherapy, she developed A. fib and was sent to the emergency department.  She was found to have a DVT in her left lower extremity.  She was started on Lovenox.  Due to concern for drop in platelets, Lovenox has been placed on hold.  HIT panel has been sent.  She also received 1 dose of Granix for neutropenia  Labs from today have been reviewed.  Her WBC is 1.3 with an ANC of 47, her hemoglobin is 8.5, and platelets are 47,000.  For the neutropenia, will continue Granix until Hepzibah is 1.5 or higher.  Would recommend PRBC transfusion for hemoglobin less than 8.  Drop in platelets likely related to chemotherapy.  Today is day 8 of cycle #3 of the chemotherapy.  Technologist review of peripheral blood smear showed polychromasia and target cells.  No schistocytes seen.  4 T score for HIT is 4 indicating an indeterminate probability of HIT.  I believe HIT is less likely given recent chemotherapy but we will follow-up on HIT panel.  The patient may resume Lovenox if HIT panel negative and once platelet count is above 50,000.  Future Appointments  Date Time Provider [place]  09/09/2021 12:45 PM  [name] None  09/10/2021 10:30 AM Gery Pray, MD [name] None  09/16/2021 12:45 PM [name] None  09/16/2021  1:30 PM Curt Bears, MD [name] None  09/16/2021  2:15 PM [name] INFUSION [name] None  09/16/2021  2:30 PM Morrell Riddle, RD [name] None  09/21/2021 10:05 AM Loel Dubonnet, NP [facility]  09/23/2021 12:45 PM [name] None  09/30/2021 12:45 PM [name] None  10/07/2021  9:00 AM [name] None  10/07/2021  9:30 AM Heilingoetter, Cassandra L, PA-C [name] None  10/07/2021 10:15 AM [name] INFUSION [name] None  10/14/2021 12:30 PM [name] None  10/21/2021 12:00 PM [name] None  10/27/2021  9:30 AM [name] None  10/27/2021 10:00 AM Curt Bears, MD [name] None  10/27/2021 10:45 AM [name] INFUSION [name] None  11/17/2021 10:30 AM [name] None  11/17/2021  11:00 AM Curt Bears, MD [name] None  11/17/2021 11:45 AM [name] INFUSION [name] None  12/08/2021 11:00 AM [name] None  12/08/2021 11:30 AM Curt Bears, MD [name] None  12/08/2021 12:30 PM [name] INFUSION [name] None      LOS: 8 days   Mikey Bussing, DNP, AGPCNP-BC, AOCNP 09/03/21
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
## 2021-09-03 NOTE — Progress Notes (Addendum)
Clinical swallow evaluation completed.   Full report to follow.   Pt's primary complaints are odynophagia and esophageal deficits - pointing to proximal esophagus to indicate area of "slow clearing" requiring her to wait for adequate clearance.  She does endorse some coughing/choking with liquids "on the way down".    Cranial nerve exam unremarkable however pt clinically judged to be hoarse, which she reports is ongoing x3 months.  Odynophagia and food/pills lodging requiring expectoration reported x2 months without progression.    Pt only willing to consume few boluses of water, grape juice, Ensure, nectar thick juice, icee and pudding during today's session.  She declined all solids offered.  Eructation observed x2 after thin liquid swallow- with pt denying significant reflux/backflow.    With larger boluses of thin liquids pt with cough/throat clearing post-swallow. Decreasing amount to tsp with thin and modifying cup bolus to nectar thick did not result in coughing immediately post-swallow.    Upon review of esophagram flouro loops *limited study per radiologist* aspiration was not observed and pt did not appear with significant pharyngeal retention from view x1.  Primary issues with swallow continue to be odynophagia *mostly at proximal esophagus* and slow clearance through esophagus. EGD showed stenosis only and hiatal hernia.   SLP reviewed esophageal dysmotility compensation strategies with pt.    Advised that if it provides more comfort for her to swallow - she can use thickener in her drinks.    Messaged MD re: potential for pt to be medically treated for odynophagia before meals to determine if may improve her intake.   Magic Mouthwash ordered by Md, Thank you.    Will follow up briefly to determine if compensation strategies mitigate dysphagia - note pt has no plans for a feeding tube.    Kim Lime, MS [facility] SLP Acute Rehab Services Office [phone] Pager [phone]

## 2021-09-04 ENCOUNTER — Other Ambulatory Visit: Payer: Self-pay

## 2021-09-04 DIAGNOSIS — D696 Thrombocytopenia, unspecified: Secondary | ICD-10-CM

## 2021-09-04 DIAGNOSIS — T451X5A Adverse effect of antineoplastic and immunosuppressive drugs, initial encounter: Secondary | ICD-10-CM | POA: Diagnosis not present

## 2021-09-04 DIAGNOSIS — C3492 Malignant neoplasm of unspecified part of left bronchus or lung: Secondary | ICD-10-CM | POA: Diagnosis not present

## 2021-09-04 DIAGNOSIS — D701 Agranulocytosis secondary to cancer chemotherapy: Secondary | ICD-10-CM | POA: Diagnosis not present

## 2021-09-04 DIAGNOSIS — I4891 Unspecified atrial fibrillation: Secondary | ICD-10-CM | POA: Diagnosis not present

## 2021-09-04 LAB — GLUCOSE, CAPILLARY
Glucose-Capillary: 101 mg/dL — ABNORMAL HIGH (ref 70–99)
Glucose-Capillary: 70 mg/dL (ref 70–99)
Glucose-Capillary: 83 mg/dL (ref 70–99)
Glucose-Capillary: 86 mg/dL (ref 70–99)
Glucose-Capillary: 98 mg/dL (ref 70–99)

## 2021-09-04 LAB — HEPARIN INDUCED PLATELET AB (HIT ANTIBODY): Heparin Induced Plt Ab: 0.136 OD (ref 0.000–0.400)

## 2021-09-04 LAB — MAGNESIUM: Magnesium: 1.7 mg/dL (ref 1.7–2.4)

## 2021-09-04 MED ORDER — MAGNESIUM SULFATE 2 GM/50ML IV SOLN
2.0000 g | Freq: Once | INTRAVENOUS | Status: AC
  Administered 2021-09-04: 2 g via INTRAVENOUS
  Filled 2021-09-04: qty 50

## 2021-09-04 MED ORDER — DIGOXIN 0.25 MG/ML IJ SOLN
0.2500 mg | Freq: Once | INTRAMUSCULAR | Status: DC
  Filled 2021-09-04: qty 2

## 2021-09-04 NOTE — Progress Notes (Addendum)
NOTE FILED 09/04/2021 for services 09/03/2021    09/03/21 1930  SLP Visit Information  SLP Received On 09/03/21  Subjective  Subjective pt awake in bed, spouse present initially  Patient/Family Stated Goal continues with odynophagia  General Information  Type of Study Bedside Swallow Evaluation  Previous Swallow Assessment esophagram, EGD  Diet Prior to this Study Thin liquids (full liquids per pt desire)  Temperature Spikes Noted No  Respiratory Status Nasal cannula  History of Recent Intubation No  Behavior/Cognition Alert;Cooperative  Oral Cavity Assessment Dry  Oral Care Completed by SLP No  Oral Cavity - Dentition Missing dentition;Poor condition  Vision Functional for self-feeding  Self-Feeding Abilities Able to feed self  Patient Positioning Upright in bed (to approx 65*)  Baseline Vocal Quality Hoarse;Low vocal intensity  Volitional Cough Weak  Volitional Swallow Unable to elicit  Pain Assessment  Pain Assessment 0-10  Pain Score 8  Pain Location back  Pain Descriptors / Indicators Aching  Pain Intervention(s) Limited activity within patient's tolerance;Patient requesting pain meds-RN notified  Oral Assessment (Complete on admission/transfer/every shift)  Does patient have any of the following "high(er) risk" factors? None of the above  Does patient have any of the following "at risk" factors? Nutritional status - inadequate  Patient is HIGH RISK: Non-ventilated Order set for Adult Oral Care Protocol initiated - "High Risk Patients - Non-Ventilated" option selected  (see row information)  Patient is AT RISK Order set for Adult Oral Care Protocol initiated -  "At Risk Patients" option selected (see row information)  Patient is LOW RISK Follow universal precautions (see row information)  Oral Motor/Sensory Function  Overall Oral Motor/Sensory Function Generalized oral weakness  Ice Chips  Ice chips NT  Thin Liquid  Thin Liquid Impaired  Presentation Cup;Spoon;Straw   Pharyngeal  Phase Impairments Throat Clearing - Immediate;Cough - Delayed;Multiple swallows  Other Comments decreasing bolus size to sp appeared to improve pt's po tolerance  Nectar Thick Liquid  Nectar Thick Liquid WFL  Presentation Spoon;Straw  Honey Thick Liquid  Honey Thick Liquid NT  Puree  Puree Impaired  Pharyngeal Phase Impairments Multiple swallows  Solid  Solid NT (pt declined to try solids)  SLP - End of Session  Patient left in bed;with call bell/phone within reach;with bed alarm set  Nurse Communication Aspiration precautions reviewed;Diet recommendation;Swallow strategies reviewed  Suspected Esophageal Findings  Suspected Esophageal Findings Belching  SLP Assessment  Clinical Impression Statement (ACUTE ONLY) Pt's primary complaints are odynophagia and esophageal deficits - pointing to proximal esophagus to indicate area of "slow clearing" requiring her to wait for adequate clearance.  She does endorse some coughing/choking with liquids "on the way down".       Cranial nerve exam unremarkable however pt clinically judged to be hoarse, which she reports is ongoing x3 months.  Odynophagia and food/pills lodging requiring expectoration reported x2 months without progression.       Pt only willing to consume few boluses of water, grape juice, Ensure, nectar thick juice, icee and pudding during today's session.  She declined all solids offered.  Eructation observed x2 after thin liquid swallow- with pt denying significant reflux/backflow.    With larger boluses of thin liquids pt with cough/throat clearing post-swallow. Decreasing amount to tsp with thin and modifying cup bolus to nectar thick did not result in coughing immediately post-swallow.       Upon review of esophagram flouro loops *limited study per radiologist* aspiration was not observed and pt did not appear with significant pharyngeal retention  from view x1.     Primary issues with swallow continue to be odynophagia *mostly  at proximal esophagus* and slow clearance through esophagus. EGD showed stenosis only and hiatal hernia.   SLP reviewed esophageal dysmotility compensation strategies with pt.       Advised that if it provides more comfort for her to swallow - she can use thickener in her drinks.       Messaged MD re: potential for pt to be medically treated for odynophagia before meals to determine if may improve her intake.   Magic Mouthwash ordered by Md, Thank you.       Will follow up briefly to determine if compensation strategies mitigate dysphagia - note pt has no plans for a feeding tube.  SLP Visit Diagnosis Dysphagia, unspecified (R13.10)  Impact on safety and function Risk for inadequate nutrition/hydration;Moderate aspiration risk  Other Related Risk Factors History of dysphagia;History of GERD;Deconditioning;Decreased respiratory status  Swallow Evaluation Recommendations  Liquid Administration via Cup;Straw;Spoon  Medication Administration Crushed with puree  Supervision Patient able to self feed  Compensations Small sips/bites;Slow rate  Postural Changes Seated upright at 90 degrees;Remain upright for at least 30 minutes after po intake  Treatment Plan  Oral Care Recommendations Oral care BID  Follow Up Recommendations No SLP follow up  Assistance recommended at discharge Intermittent Supervision/Assistance  Functional Status Assessment Patient has had a recent decline in their functional status and demonstrates the ability to make significant improvements in function in a reasonable and predictable amount of time.  Speech Therapy Frequency (ACUTE ONLY) min 1 x/week  Treatment Duration 2 weeks  Interventions Aspiration precaution training;Compensatory techniques;Patient/family education  Prognosis  Prognosis for Safe Diet Advancement Fair  Barriers to Reach Goals Severity of deficits;Other (Comment) (medical diagnosis)  Individuals Consulted  Consulted and Agree with Results and Recommendations  Patient  Progression Toward Goals  Progression toward goals Progressing toward goals  SLP Time Calculation  SLP Start Time (ACUTE ONLY) 1650  SLP Stop Time (ACUTE ONLY) 1721  SLP Time Calculation (min) (ACUTE ONLY) 31 min  SLP Evaluations  $ SLP Speech Visit 1 Visit  SLP Evaluations  $BSS Swallow 1 Procedure  [name], MS [facility] SLP Acute Rehab Services Office [phone] Pager [phone]
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
## 2021-09-04 NOTE — Progress Notes (Signed)
SLP   Patient Details Name: Kim Ross MRN: [id number] DOB: 09-12-1956  :         MD reached out to SLP re pt's swallowing and reported pt is feeling better today. She did not attempt solids last night but was interested in canned peaches.  With MD permission, advanced diet to dys2/thin and asked RN to order peaches for the pt to see how she tolerates.  In addition, Magic Mouthwash ordered by MD, please provide prior to po intake - as pt continues with odynophagia.  Thanks.    [name] 09/04/2021, 12:15 PM

## 2021-09-04 NOTE — Progress Notes (Signed)
Speech Language Pathology Treatment: Dysphagia  Patient Details Name: Kim Ross MRN: [id number] DOB: [birth year] Today's Date: 09/04/2021 Time: 1850-1905 SLP Time Calculation (min) (ACUTE ONLY): 15 min  Assessment / Plan / Recommendation Clinical Impression  Skilled SLP session completed for dysphagia management - to determine if pt's odynophagia improving with magic mouthwash.  Pt advised her odynophagia is better and she was willing to consume some solids with this SLP.  RN had ordered pt dinner tray including peaches.  Pt's voice is marginally improved today.  She self fed peaches with adequate mastication and oral clearance.  Pt consumed 5 tsps of peaches and drinking peach liquid prior to advising she did not want anymore.  She pointed to distal pharynx/proximal esophagus to indicate slow clearance.  She advised "it's ok, it will go down".  Pt with known esophageal dysmotility - and suspect this symptom is due to dysmotlity.  SLP recommends for pt to continue soft diet and provided her with menu so she can choose most palatable items. Reviewed compensation strategies using teach back. Pt is managing her dysphagia as best possible.    When she deems ready, recommend advance to regular/thin.  SLP education re: compensation strategies completed, no SLP follow up needed.   HPI        SLP Plan  All goals met      Recommendations for follow up therapy are one component of a multi-disciplinary discharge planning process, led by the attending physician.  Recommendations may be updated based on patient status, additional functional criteria and insurance authorization.    Recommendations  Diet recommendations: Dysphagia 3 (mechanical soft);Thin liquid Medication Administration: Crushed with puree Supervision: Patient able to self feed Compensations: Small sips/bites;Slow rate (drink liquids during meals) Postural Changes and/or Swallow Maneuvers: Seated upright 90 degrees;Upright 30-60 min  after meal                Oral Care Recommendations: Oral care BID Follow Up Recommendations: No SLP follow up Assistance recommended at discharge: Intermittent Supervision/Assistance SLP Visit Diagnosis: Dysphagia, unspecified (R13.10) Plan: All goals met       GO              [name], MS [facility] SLP Acute Rehab Services Office [phone] Pager [phone]   [name]  09/04/2021, 8:18 PM

## 2021-09-04 NOTE — Progress Notes (Signed)
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 patient condition. Patient had difficulty swallowing a significant amount of contrast. Poor esophageal motility. Suspect a high-grade distal esophageal stricture, best seen on cine imaging. IMPRESSION: 1. Very limited exam performed due to patient condition. 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 2. Poor esophageal motility. Electronically Signed   By: Lorin Picket M.D.   On: 08/31/2021 09:19   US THORACENTESIS ASP PLEURAL SPACE W/IMG GUIDE  Result Date: 08/26/2021 INDICATION: Patient with history of stage IV non-small cell lung cancer, left pleural effusion; request received for diagnostic and therapeutic left thoracentesis. EXAM: ULTRASOUND GUIDED DIAGNOSTIC AND THERAPEUTIC LEFT THORACENTESIS MEDICATIONS: 10 mL 1% lidocaine COMPLICATIONS: None immediate. PROCEDURE: An ultrasound guided thoracentesis was thoroughly discussed with the patient and questions  answered. The benefits, risks, alternatives and complications were also discussed. The patient understands and wishes to proceed with the procedure. Written consent was obtained. Ultrasound was performed to localize and mark an adequate pocket of fluid in the left chest. The area was then prepped and draped in the normal sterile fashion. 1% Lidocaine was used for local anesthesia. Under ultrasound guidance a 6 Fr Safe-T-Centesis catheter was introduced. Thoracentesis was performed. The catheter was removed and a dressing applied. FINDINGS: A total of approximately 320 cc of yellow fluid was removed. Samples were sent to the laboratory as requested by the clinical team. IMPRESSION: Successful ultrasound guided diagnostic and therapeutic left thoracentesis yielding 320 cc of pleural fluid. Read by: Rowe Robert, PA-C Electronically Signed   By: Miachel Roux M.D.   On: 08/26/2021 16:19    ASSESSMENT AND PLAN: This is a 65 year old female with stage IV non-small cell lung cancer, adenosquamous carcinoma who was initially diagnosed with a large cavitary left lower lobe lung mass, cavitary mass in the mediastinum, and left upper lobe nodule in addition to pleuritic mass with pleural and lytic bone lesions.  She was diagnosed in September 2022.  She is now receiving palliative systemic chemotherapy with carboplatin for an AUC of 5, Alimta 500 mg per metered squared, and Keytruda 200 mg IV every 3 weeks.  Status post 3 cycles.  Last cycle was given  on 08/25/2021.    During her last chemotherapy, she developed A. fib and was sent to the emergency department.  She was found to have a DVT in her left lower extremity.  She was started on Lovenox.  Due to concern for drop in platelets, Lovenox has been placed on hold.  HIT panel has been sent and results are still pending.  She has been receiving Granix and we plan to continue medication until [place] is 1.5 or higher.  No need for PRBC or platelet transfusion today.   Recommend PRBC transfusion for hemoglobin less than 7 or platelet transfusion for platelet count less than 20,000 or active bleeding.  The drop in platelets likely related to chemotherapy.  Today is day 9 of cycle #3 of the chemotherapy.  No schistocytes seen on peripheral blood smear 12/1.  4 T score for HIT is 4 indicating an indeterminate probability of HIT.  I believe HIT is less likely given recent chemotherapy but we will follow-up on HIT panel.  The patient may resume Lovenox if HIT panel negative and once platelet count is above 50,000.  Please call oncology over the weekend for questions.  I will check on her again on Monday if she remains in the hospital  Future Appointments  Date Time Provider [place]  09/09/2021 12:45 PM [name] None  09/10/2021 10:30 AM Gery Pray, MD [name] None  09/16/2021 12:45 PM [name] None  09/16/2021  1:30 PM Curt Bears, MD [name] None  09/16/2021  2:15 PM [name] INFUSION [name] None  09/16/2021  2:30 PM Morrell Riddle, RD [name] None  09/21/2021 10:05 AM Loel Dubonnet, NP [facility]  09/23/2021 12:45 PM [name] None  09/30/2021 12:45 PM [name] None  10/07/2021  9:00 AM [name] None  10/07/2021  9:30 AM Heilingoetter, Cassandra L, PA-C [name] None  10/07/2021 10:15 AM [name] INFUSION [name] None  10/14/2021 12:30 PM [name] None  10/21/2021 12:00 PM [name] None  10/27/2021  9:30 AM [name] None  10/27/2021 10:00 AM Curt Bears, MD [name] None  10/27/2021 10:45 AM [name] INFUSION [name] None  11/17/2021 10:30 AM [name] None  11/17/2021 11:00 AM Curt Bears, MD [name] None  11/17/2021 11:45 AM [name] INFUSION [name] None  12/08/2021 11:00 AM [name] None   12/08/2021 11:30 AM Curt Bears, MD [name] None  12/08/2021 12:30 PM [name] INFUSION [name] None      LOS: 9 days   Mikey Bussing, DNP, AGPCNP-BC, AOCNP 09/04/21

## 2021-09-04 NOTE — Evaluation (Signed)
Occupational Therapy Evaluation Patient Details Name: Kim Ross MRN: [id number] DOB: [birth year] Today's Date: 09/04/2021   History of Present Illness 65yo female who presented on 11/22 after having palpitations while at [facility] center; found to be in Afib with RVR and transferred to ED. PE negative but did have pleural effusion, addressed with thoracentesis on 11/23. 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  PTA she lives with family and has 24 hour assist as needed.  Deficits impacting independence are listed below.  Currently she is declining out of bed to the recliner.  OT attempted times two this date.  She is difficult to motivate given her current medical condition, and OT can decrease frequency if she continues to refuse out of bed.  The plan is for a return home, assist as needed from family, and HH OT/PT.  Hopefully she will show motivation to increase her ADL independence and reduce caregiver burden, if not, HH OT can be deferred once [facility] PT sees meaningful gains.  OT will continue to follow her in the acute setting.        Recommendations for follow up therapy are one component of a multi-disciplinary discharge planning process, led by the attending physician.  Recommendations may be updated based on patient status, additional functional criteria and insurance authorization.   Follow Up Recommendations  Home health OT    Assistance Recommended at Discharge Frequent or constant Supervision/Assistance  Functional Status Assessment  Patient has had a recent decline in their functional status and demonstrates the ability to make significant improvements in function in a reasonable and predictable amount of time.  Equipment Recommendations  BSC/3in1    Recommendations for Other Services       Precautions / Restrictions Precautions Precautions: Fall Precaution Comments: high fracture risk due  CA, L LE pain, watch vitals, Restrictions Weight Bearing Restrictions: No      Mobility Bed Mobility Overal bed mobility: Needs Assistance Bed Mobility: Rolling Rolling: Min guard         General bed mobility comments: patient able to help slide higher in the bed, Mod A.  Repositioned due to low back discomfort and stiffness.    Transfers                   General transfer comment: patient deferred OOB to the recliner times two this date.      Balance                                           ADL either performed or assessed with clinical judgement   ADL Overall ADL's : Needs assistance/impaired     Grooming: Wash/dry hands;Wash/dry face;Set up;Bed level   Upper Body Bathing: Moderate assistance;Bed level;Minimal assistance   Lower Body Bathing: Maximal assistance;Bed level   Upper Body Dressing : Minimal assistance;Bed level   Lower Body Dressing: Maximal assistance;Bed level       Toileting- Clothing Manipulation and Hygiene: Maximal assistance;Bed level               Vision Patient Visual Report: No change from baseline       Perception Perception Perception: Not tested   Praxis Praxis Praxis: Not tested    Pertinent Vitals/Pain Faces Pain Scale: Hurts little more Pain Location: back Pain  Descriptors / Indicators: Aching Pain Intervention(s): Repositioned     Hand Dominance Right   Extremity/Trunk Assessment Upper Extremity Assessment Upper Extremity Assessment: Generalized weakness (functional for eval, but weak for her age)   Lower Extremity Assessment Lower Extremity Assessment: Defer to PT evaluation       Communication Communication Communication: No difficulties   Cognition Arousal/Alertness: Awake/alert Behavior During Therapy: Flat affect Overall Cognitive Status: Within Functional Limits for tasks assessed                                                        Home  Living Family/patient expects to be discharged to:: Private residence Living Arrangements: Spouse/significant other;Children Available Help at Discharge: Family;Available 24 hours/day Type of Home: House Home Access: Stairs to enter [employer] of Steps: 8 STE in the front; can get in the back by going up the driveway with 0 STE at this enterance Entrance Stairs-Rails: Can reach both Home Layout: One level     Bathroom Shower/Tub: Teacher, early years/pre: Standard     Home Equipment: Rollator (4 wheels);Cane - quad;Other (comment);Wheelchair - manual          Prior Functioning/Environment Prior Level of Function : Needs assist       Physical Assist : ADLs (physical)   ADLs (physical): Bathing;Dressing;IADLs Mobility Comments: Per PT eval: mostly just getting OOB/transferring to/from chair; familiy pushes her to try to walk but she is very anxious secondary to LE pain. uses rollator almost as a WC seat ADLs Comments: family helps with bathing/dressing, community mobility, home management and meals.  Patient states she knows what her medications are, but family gets them for her.        OT Problem List: Decreased strength;Decreased activity tolerance;Impaired balance (sitting and/or standing)      OT Treatment/Interventions: Self-care/ADL training;Therapeutic activities;Therapeutic exercise;DME and/or AE instruction;Patient/family education;Balance training    OT Goals(Current goals can be found in the care plan section) Acute Rehab OT Goals Patient Stated Goal: Go home OT Goal Formulation: With patient Time For Goal Achievement: 09/17/21 Potential to Achieve Goals: Good  OT Frequency: Min 2X/week   Barriers to D/C:    None noted       Co-evaluation              AM-PAC OT "6 Clicks" Daily Activity     Outcome Measure Help from another person eating meals?: A Little Help from another person taking care of personal grooming?: A  Little Help from another person toileting, which includes using toliet, bedpan, or urinal?: A Lot Help from another person bathing (including washing, rinsing, drying)?: A Lot Help from another person to put on and taking off regular upper body clothing?: A Little Help from another person to put on and taking off regular lower body clothing?: A Lot 6 Click Score: 15   End of Session    Activity Tolerance: Patient limited by fatigue Patient left: in bed;with call bell/phone within reach  OT Visit Diagnosis: Unsteadiness on feet (R26.81);Muscle weakness (generalized) (M62.81);History of falling (Z91.81);Pain                Time: 1115-5208 OT Time Calculation (min): 17 min Charges:  OT General Charges $OT Visit: 1 Visit OT Evaluation $OT Eval Moderate Complexity: 1 Mod  09/04/2021  RP, OTR/L  Acute Rehabilitation Services  Office:  [phone]   Metta Clines 09/04/2021, 2:21 PM

## 2021-09-04 NOTE — Progress Notes (Signed)
Laboratory called with critical lab values, WBC 0.9 Platelets  29, absolute neutrophils 0.4.  Lab values reported to on call NP.

## 2021-09-05 DIAGNOSIS — I4891 Unspecified atrial fibrillation: Secondary | ICD-10-CM | POA: Diagnosis not present

## 2021-09-05 LAB — MAGNESIUM: Magnesium: 1.7 mg/dL (ref 1.7–2.4)

## 2021-09-05 LAB — CBC WITH DIFFERENTIAL/PLATELET
Abs Immature Granulocytes: 0.02 10*3/uL (ref 0.00–0.07)
Abs Immature Granulocytes: 0.02 10*3/uL (ref 0.00–0.07)
Basophils Absolute: 0 10*3/uL (ref 0.0–0.1)
Basophils Absolute: 0 10*3/uL (ref 0.0–0.1)
Basophils Relative: 1 %
Basophils Relative: 0 %
Eosinophils Absolute: 0 10*3/uL (ref 0.0–0.5)
Eosinophils Absolute: 0 10*3/uL (ref 0.0–0.5)
Eosinophils Relative: 1 %
Eosinophils Relative: 1 %
HCT: 23.8 % — ABNORMAL LOW (ref 36.0–46.0)
HCT: 21 % — ABNORMAL LOW (ref 36.0–46.0)
Hemoglobin: 7.6 g/dL — ABNORMAL LOW (ref 12.0–15.0)
Hemoglobin: 6.7 g/dL — CL (ref 12.0–15.0)
Immature Granulocytes: 2 %
Immature Granulocytes: 2 %
Lymphocytes Relative: 23 %
Lymphocytes Relative: 26 %
Lymphs Abs: 0.3 10*3/uL — ABNORMAL LOW (ref 0.7–4.0)
Lymphs Abs: 0.3 10*3/uL — ABNORMAL LOW (ref 0.7–4.0)
MCH: 28.1 pg (ref 26.0–34.0)
MCH: 28.5 pg (ref 26.0–34.0)
MCHC: 31.9 g/dL (ref 30.0–36.0)
MCHC: 31.9 g/dL (ref 30.0–36.0)
MCV: 88.1 fL (ref 80.0–100.0)
MCV: 89.4 fL (ref 80.0–100.0)
Monocytes Absolute: 0.3 10*3/uL (ref 0.1–1.0)
Monocytes Absolute: 0.2 10*3/uL (ref 0.1–1.0)
Monocytes Relative: 22 %
Monocytes Relative: 24 %
Neutro Abs: 0.6 10*3/uL — ABNORMAL LOW (ref 1.7–7.7)
Neutro Abs: 0.5 10*3/uL — ABNORMAL LOW (ref 1.7–7.7)
Neutrophils Relative %: 51 %
Neutrophils Relative %: 47 %
Platelets: 16 10*3/uL — CL (ref 150–400)
Platelets: 122 10*3/uL — ABNORMAL LOW (ref 150–400)
RBC: 2.7 MIL/uL — ABNORMAL LOW (ref 3.87–5.11)
RBC: 2.35 MIL/uL — ABNORMAL LOW (ref 3.87–5.11)
RDW: 17.8 % — ABNORMAL HIGH (ref 11.5–15.5)
RDW: 17.6 % — ABNORMAL HIGH (ref 11.5–15.5)
WBC: 1.1 10*3/uL — CL (ref 4.0–10.5)
WBC: 1 10*3/uL — CL (ref 4.0–10.5)
nRBC: 0 % (ref 0.0–0.2)
nRBC: 0 % (ref 0.0–0.2)

## 2021-09-05 LAB — BASIC METABOLIC PANEL
Anion gap: 5 (ref 5–15)
BUN: 7 mg/dL — ABNORMAL LOW (ref 8–23)
CO2: 28 mmol/L (ref 22–32)
Calcium: 7.4 mg/dL — ABNORMAL LOW (ref 8.9–10.3)
Chloride: 99 mmol/L (ref 98–111)
Creatinine, Ser: 0.39 mg/dL — ABNORMAL LOW (ref 0.44–1.00)
GFR, Estimated: >60 mL/min (ref >60)
Glucose, Bld: 84 mg/dL (ref 70–99)
Potassium: 3.3 mmol/L — ABNORMAL LOW (ref 3.5–5.1)
Sodium: 132 mmol/L — ABNORMAL LOW (ref 135–145)

## 2021-09-05 LAB — GLUCOSE, CAPILLARY
Glucose-Capillary: 75 mg/dL (ref 70–99)
Glucose-Capillary: 90 mg/dL (ref 70–99)
Glucose-Capillary: 92 mg/dL (ref 70–99)
Glucose-Capillary: 72 mg/dL (ref 70–99)

## 2021-09-05 LAB — PREPARE RBC (CROSSMATCH)

## 2021-09-05 MED ORDER — MAGNESIUM SULFATE 2 GM/50ML IV SOLN
2.0000 g | Freq: Once | INTRAVENOUS | Status: AC
  Administered 2021-09-05: 2 g via INTRAVENOUS
  Filled 2021-09-05: qty 50

## 2021-09-05 MED ORDER — SODIUM CHLORIDE 0.9% IV SOLUTION: Freq: Once | INTRAVENOUS | Status: AC

## 2021-09-05 MED ORDER — POTASSIUM CHLORIDE 20 MEQ PO PACK
40.0000 meq | PACK | Freq: Once | ORAL | Status: AC
  Administered 2021-09-05: 40 meq via ORAL
  Filled 2021-09-05: qty 2

## 2021-09-05 NOTE — Plan of Care (Signed)
  Problem: Education: Goal: Knowledge of General Education information will improve Description: Including pain rating scale, medication(s)/side effects and non-pharmacologic comfort measures Outcome: Progressing   Problem: Clinical Measurements: Goal: Respiratory complications will improve Outcome: Progressing   Problem: Nutrition: Goal: Adequate nutrition will be maintained Outcome: Not Progressing   Problem: Pain Managment: Goal: General experience of comfort will improve Outcome: Not Progressing

## 2021-09-05 NOTE — Progress Notes (Signed)
Date and time results received: 09/05/21 1757 (use smartphrase ".now" to insert current time)  Test: Hemoglobin Critical Value: 6.7  Name of Provider Notified: Dr. Tawanna Solo  Orders Received? Or Actions Taken?: Waiting on new orders
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
## 2021-09-06 DIAGNOSIS — I4891 Unspecified atrial fibrillation: Secondary | ICD-10-CM | POA: Diagnosis not present

## 2021-09-06 LAB — GLUCOSE, CAPILLARY
Glucose-Capillary: 67 mg/dL — ABNORMAL LOW (ref 70–99)
Glucose-Capillary: 69 mg/dL — ABNORMAL LOW (ref 70–99)
Glucose-Capillary: 81 mg/dL (ref 70–99)
Glucose-Capillary: 82 mg/dL (ref 70–99)
Glucose-Capillary: 64 mg/dL — ABNORMAL LOW (ref 70–99)
Glucose-Capillary: 73 mg/dL (ref 70–99)
Glucose-Capillary: 87 mg/dL (ref 70–99)

## 2021-09-06 LAB — CBC WITH DIFFERENTIAL/PLATELET
Abs Immature Granulocytes: 0.11 10*3/uL — ABNORMAL HIGH (ref 0.00–0.07)
Basophils Absolute: 0 10*3/uL (ref 0.0–0.1)
Basophils Relative: 0 %
Eosinophils Absolute: 0 10*3/uL (ref 0.0–0.5)
Eosinophils Relative: 1 %
HCT: 29.1 % — ABNORMAL LOW (ref 36.0–46.0)
Hemoglobin: 9.5 g/dL — ABNORMAL LOW (ref 12.0–15.0)
Immature Granulocytes: 11 %
Lymphocytes Relative: 26 %
Lymphs Abs: 0.3 10*3/uL — ABNORMAL LOW (ref 0.7–4.0)
MCH: 29.1 pg (ref 26.0–34.0)
MCHC: 32.6 g/dL (ref 30.0–36.0)
MCV: 89 fL (ref 80.0–100.0)
Monocytes Absolute: 0.3 10*3/uL (ref 0.1–1.0)
Monocytes Relative: 32 %
Neutro Abs: 0.3 10*3/uL — CL (ref 1.7–7.7)
Neutrophils Relative %: 30 %
Platelets: 107 10*3/uL — ABNORMAL LOW (ref 150–400)
RBC: 3.27 MIL/uL — ABNORMAL LOW (ref 3.87–5.11)
RDW: 16.5 % — ABNORMAL HIGH (ref 11.5–15.5)
WBC: 1 10*3/uL — CL (ref 4.0–10.5)
nRBC: 0 % (ref 0.0–0.2)

## 2021-09-06 LAB — BASIC METABOLIC PANEL
Anion gap: 7 (ref 5–15)
BUN: 5 mg/dL — ABNORMAL LOW (ref 8–23)
CO2: 29 mmol/L (ref 22–32)
Calcium: 7.8 mg/dL — ABNORMAL LOW (ref 8.9–10.3)
Chloride: 97 mmol/L — ABNORMAL LOW (ref 98–111)
Creatinine, Ser: 0.43 mg/dL — ABNORMAL LOW (ref 0.44–1.00)
GFR, Estimated: >60 mL/min (ref >60)
Glucose, Bld: 74 mg/dL (ref 70–99)
Potassium: 3.1 mmol/L — ABNORMAL LOW (ref 3.5–5.1)
Sodium: 133 mmol/L — ABNORMAL LOW (ref 135–145)

## 2021-09-06 LAB — MAGNESIUM: Magnesium: 1.6 mg/dL — ABNORMAL LOW (ref 1.7–2.4)

## 2021-09-06 MED ORDER — POTASSIUM CHLORIDE 20 MEQ PO PACK
60.0000 meq | PACK | Freq: Once | ORAL | Status: DC
  Filled 2021-09-06: qty 3

## 2021-09-06 MED ORDER — DEXTROSE 5 % IV SOLN: INTRAVENOUS | Status: DC

## 2021-09-06 MED ORDER — POTASSIUM CHLORIDE 10 MEQ/100ML IV SOLN
10.0000 meq | INTRAVENOUS | Status: AC
  Administered 2021-09-06 (×5): 10 meq via INTRAVENOUS
  Filled 2021-09-06 (×5): qty 100

## 2021-09-06 MED ORDER — MAGNESIUM SULFATE 2 GM/50ML IV SOLN
2.0000 g | Freq: Once | INTRAVENOUS | Status: AC
  Administered 2021-09-06: 08:00:00 2 g via INTRAVENOUS
  Filled 2021-09-06: qty 50

## 2021-09-06 NOTE — Progress Notes (Signed)
Patient's HR continues to spike into the 140's -160's.  [facility] night coverage notified.  No new orders.

## 2021-09-06 NOTE — Progress Notes (Incomplete)
Hypoglycemic Event  CBG: 64   Treatment: 4 oz juice/soda  Symptoms: None  Follow-up CBG: Time:1700 CBG Result:***  Possible Reasons for Event: Other: Decreased oral intake  Comments/MD notified:Dr. Alfredia Client
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
## 2021-09-06 NOTE — Progress Notes (Signed)
Hypoglycemic Event  CBG: 69  Treatment: 4 oz juice/soda  Symptoms: None  Follow-up CBG: [id number] CBG Result:67  Possible Reasons for Event: Other: Low oral intake  Comments/MD notified:Dr. Alfredia Client

## 2021-09-07 DIAGNOSIS — T451X5A Adverse effect of antineoplastic and immunosuppressive drugs, initial encounter: Secondary | ICD-10-CM | POA: Diagnosis not present

## 2021-09-07 DIAGNOSIS — D696 Thrombocytopenia, unspecified: Secondary | ICD-10-CM | POA: Diagnosis not present

## 2021-09-07 DIAGNOSIS — D701 Agranulocytosis secondary to cancer chemotherapy: Secondary | ICD-10-CM | POA: Diagnosis not present

## 2021-09-07 DIAGNOSIS — I4891 Unspecified atrial fibrillation: Secondary | ICD-10-CM | POA: Diagnosis not present

## 2021-09-07 DIAGNOSIS — C3492 Malignant neoplasm of unspecified part of left bronchus or lung: Secondary | ICD-10-CM | POA: Diagnosis not present

## 2021-09-07 LAB — PREPARE PLATELET PHERESIS
Unit division: 0
Unit division: 0

## 2021-09-07 LAB — GLUCOSE, CAPILLARY
Glucose-Capillary: 98 mg/dL (ref 70–99)
Glucose-Capillary: 94 mg/dL (ref 70–99)
Glucose-Capillary: 115 mg/dL — ABNORMAL HIGH (ref 70–99)
Glucose-Capillary: 104 mg/dL — ABNORMAL HIGH (ref 70–99)

## 2021-09-07 LAB — CBC WITH DIFFERENTIAL/PLATELET
Abs Immature Granulocytes: 0.12 10*3/uL — ABNORMAL HIGH (ref 0.00–0.07)
Abs Immature Granulocytes: 0 10*3/uL (ref 0.00–0.07)
Basophils Absolute: 0 10*3/uL (ref 0.0–0.1)
Basophils Absolute: 0 10*3/uL (ref 0.0–0.1)
Basophils Relative: 1 %
Basophils Relative: 1 %
Eosinophils Absolute: 0 10*3/uL (ref 0.0–0.5)
Eosinophils Absolute: 0 10*3/uL (ref 0.0–0.5)
Eosinophils Relative: 1 %
Eosinophils Relative: 1 %
HCT: 24.6 % — ABNORMAL LOW (ref 36.0–46.0)
HCT: 32.2 % — ABNORMAL LOW (ref 36.0–46.0)
Hemoglobin: 7.9 g/dL — ABNORMAL LOW (ref 12.0–15.0)
Hemoglobin: 10.4 g/dL — ABNORMAL LOW (ref 12.0–15.0)
Immature Granulocytes: 13 %
Immature Granulocytes: 0 %
Lymphocytes Relative: 28 %
Lymphocytes Relative: 22 %
Lymphs Abs: 0.3 10*3/uL — ABNORMAL LOW (ref 0.7–4.0)
Lymphs Abs: 0.4 10*3/uL — ABNORMAL LOW (ref 0.7–4.0)
MCH: 28.5 pg (ref 26.0–34.0)
MCH: 28.7 pg (ref 26.0–34.0)
MCHC: 32.1 g/dL (ref 30.0–36.0)
MCHC: 32.3 g/dL (ref 30.0–36.0)
MCV: 88.8 fL (ref 80.0–100.0)
MCV: 89 fL (ref 80.0–100.0)
Monocytes Absolute: 0.1 10*3/uL (ref 0.1–1.0)
Monocytes Absolute: 0.5 10*3/uL (ref 0.1–1.0)
Monocytes Relative: 12 %
Monocytes Relative: 29 %
Neutro Abs: 0.4 10*3/uL — CL (ref 1.7–7.7)
Neutro Abs: 0.8 10*3/uL — ABNORMAL LOW (ref 1.7–7.7)
Neutrophils Relative %: 45 %
Neutrophils Relative %: 47 %
Platelets: 29 10*3/uL — CL (ref 150–400)
Platelets: 75 10*3/uL — ABNORMAL LOW (ref 150–400)
RBC: 2.77 MIL/uL — ABNORMAL LOW (ref 3.87–5.11)
RBC: 3.62 MIL/uL — ABNORMAL LOW (ref 3.87–5.11)
RDW: 17.9 % — ABNORMAL HIGH (ref 11.5–15.5)
RDW: 16.9 % — ABNORMAL HIGH (ref 11.5–15.5)
WBC: 0.9 10*3/uL — CL (ref 4.0–10.5)
WBC: 1.7 10*3/uL — ABNORMAL LOW (ref 4.0–10.5)
nRBC: 0 % (ref 0.0–0.2)
nRBC: 0 % (ref 0.0–0.2)

## 2021-09-07 LAB — BASIC METABOLIC PANEL
Anion gap: 7 (ref 5–15)
BUN: 5 mg/dL — ABNORMAL LOW (ref 8–23)
CO2: 28 mmol/L (ref 22–32)
Calcium: 8.2 mg/dL — ABNORMAL LOW (ref 8.9–10.3)
Chloride: 98 mmol/L (ref 98–111)
Creatinine, Ser: <0.3 mg/dL — ABNORMAL LOW (ref 0.44–1.00)
Glucose, Bld: 92 mg/dL (ref 70–99)
Potassium: 3.5 mmol/L (ref 3.5–5.1)
Sodium: 133 mmol/L — ABNORMAL LOW (ref 135–145)

## 2021-09-07 LAB — TYPE AND SCREEN
ABO/RH(D): B POS
Antibody Screen: NEGATIVE
Unit division: 0

## 2021-09-07 LAB — BPAM RBC
Blood Product Expiration Date: 202212202359
ISSUE DATE / TIME: 202212032032
Unit Type and Rh: 7300

## 2021-09-07 LAB — BPAM PLATELET PHERESIS
Blood Product Expiration Date: 202212032359
Blood Product Expiration Date: 202212042359
ISSUE DATE / TIME: 202212030937
ISSUE DATE / TIME: 202212031325
Unit Type and Rh: 5100
Unit Type and Rh: 6200

## 2021-09-07 LAB — MAGNESIUM: Magnesium: 1.7 mg/dL (ref 1.7–2.4)

## 2021-09-07 MED ORDER — PANTOPRAZOLE SODIUM 40 MG PO TBEC
40.0000 mg | DELAYED_RELEASE_TABLET | Freq: Two times a day (BID) | ORAL | Status: DC
  Administered 2021-09-07 – 2021-09-13 (×14): 40 mg via ORAL
  Filled 2021-09-07 (×14): qty 1

## 2021-09-07 MED ORDER — MAGNESIUM SULFATE 2 GM/50ML IV SOLN
2.0000 g | Freq: Once | INTRAVENOUS | Status: AC
  Administered 2021-09-07: 2 g via INTRAVENOUS
  Filled 2021-09-07: qty 50

## 2021-09-07 MED ORDER — POTASSIUM CHLORIDE 10 MEQ/100ML IV SOLN
10.0000 meq | INTRAVENOUS | Status: AC
  Administered 2021-09-07 (×3): 10 meq via INTRAVENOUS
  Filled 2021-09-07 (×2): qty 100

## 2021-09-07 MED ORDER — TBO-FILGRASTIM 300 MCG/0.5ML ~~LOC~~ SOSY
300.0000 ug | PREFILLED_SYRINGE | Freq: Every day | SUBCUTANEOUS | Status: DC
  Administered 2021-09-07: 300 ug via SUBCUTANEOUS
  Filled 2021-09-07: qty 0.5

## 2021-09-07 NOTE — Progress Notes (Signed)
Physical Therapy Treatment Patient Details Name: Kim Ross MRN: [id number] DOB: [birth year] Today's Date: 09/07/2021   History of Present Illness 65yo female who presented on 11/22 after having palpitations while at [place] center; found to be in Afib with RVR and transferred to ED. PE negative but did have pleural effusion, addressed with thoracentesis on 11/23. PMH cocaine abuse, UTI, DM, stage IV lung CA with brain met on chemo    PT Comments    Treatment limited by patient to sitting on bed edge only, Sat x 2 minutes. Continue mobi.ity as patient tolerates.   Recommendations for follow up therapy are one component of a multi-disciplinary discharge planning process, led by the attending physician.  Recommendations may be updated based on patient status, additional functional criteria and insurance authorization.  Follow Up Recommendations  Home health PT     Assistance Recommended at Discharge Frequent or constant Supervision/Assistance  Equipment Recommendations  Wheelchair (measurements PT);Wheelchair cushion (measurements PT)    Recommendations for Other Services       Precautions / Restrictions Precautions Precautions: Fall Precaution Comments: high fracture risk due CA, L LE pain, watch vitals,     Mobility  Bed Mobility       Sidelying to sit: Min assist Supine to sit: Supervision     General bed mobility comments: assist with trunk, patient  pulling on therapist hand.    Transfers                   General transfer comment: pt. declined    Ambulation/Gait                   Stairs             Wheelchair Mobility    Modified Rankin (Stroke Patients Only)       Balance   Sitting-balance support: Bilateral upper extremity supported;Feet unsupported Sitting balance-Leahy Scale: Fair Sitting balance - Comments: sits without support x 2 minutes                                    Cognition Arousal/Alertness:  Awake/alert Behavior During Therapy: Flat affect Overall Cognitive Status: Within Functional Limits for tasks assessed                                 General Comments: states that she will only sit on bed edge        Exercises      General Comments        Pertinent Vitals/Pain Faces Pain Scale: Hurts little more Pain Location: back Pain Descriptors / Indicators: Aching Pain Intervention(s): Premedicated before session    Home Living                          Prior Function            PT Goals (current goals can now be found in the care plan section) Progress towards PT goals: Progressing toward goals    Frequency    Min 3X/week      PT Plan Current plan remains appropriate    Co-evaluation              AM-PAC PT "6 Clicks" Mobility   Outcome Measure  Help needed turning from your back to your side while in a flat  bed without using bedrails?: A Little Help needed moving from lying on your back to sitting on the side of a flat bed without using bedrails?: A Little Help needed moving to and from a bed to a chair (including a wheelchair)?: A Lot Help needed standing up from a chair using your arms (e.g., wheelchair or bedside chair)?: A Lot Help needed to walk in hospital room?: Total Help needed climbing 3-5 steps with a railing? : Total 6 Click Score: 12    End of Session   Activity Tolerance: Patient limited by fatigue Patient left: in bed;with call bell/phone within reach Nurse Communication: Mobility status PT Visit Diagnosis: Unsteadiness on feet (R26.81);Muscle weakness (generalized) (M62.81);Difficulty in walking, not elsewhere classified (R26.2);Pain     Time: 1050-1110 PT Time Calculation (min) (ACUTE ONLY): 20 min  Charges:  $Therapeutic Activity: 8-22 mins                     [name] PT Acute Rehabilitation Services Pager [phone] Office [phone]    [name] 09/07/2021, 1:34  PM
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
## 2021-09-07 NOTE — Progress Notes (Signed)
Nutrition Follow-up  DOCUMENTATION CODES:   Severe malnutrition in context of chronic illness  INTERVENTION:  - continue Ensure Enlive TID, each supplement provides 350 kcal and 20 grams of protein. - will order Boost Breeze BID, each supplement provides 250 kcal and 9 grams of protein. - communicated with MD and RN about RD's discussion with patient about recommendation for small bore NGT placement and initiation of TF.    NUTRITION DIAGNOSIS:   Severe Malnutrition related to chronic illness, cancer and cancer related treatments as evidenced by severe fat depletion, severe muscle depletion. -ongoing  GOAL:   Patient will meet greater than or equal to 90% of their needs -unmet  MONITOR:   PO intake, Supplement acceptance, Labs, Weight trends, Skin, I & O's  REASON FOR ASSESSMENT:   Consult Assessment of nutrition requirement/status, Poor PO  ASSESSMENT:   65 yo female with a PMH of stage IV non-small cell lung cancer, diabetes mellitus type 2, hyperlipidemia who has been recently diagnosed with lung cancer receiving chemotherapy was found to be having palpitations and found to be in A. fib with RVR at the chemotherapy infusion center and was referred to the ER.  Patient states over the last 2 months patient has been having intermittent episodes of palpitations with some chest pressure.  Also has been chronically short of breath. Admitted with A-fib with RVR.  Recently documented meal intakes: 11/28- 0% of lunch and 25% of dinner 11/29- 25% of lunch and 15% of dinner 12/5- 0% of breakfast and 0% of lunch  Diet advanced to CLD on 11/29 at 1154, to FLD on 12/1 at 1130, to Dysphagia 2, thin liquids on 12/2 at 1218, and to Dysphagia 3, thin liquids on 12/2 at 1648.   She has been accepting Ensure ~90% of the time it is offered to her. Patient was sipping on Ensure during RD visit and reports her favorite flavor is vanilla.   Able to talk with RN prior to visit to patient's room.  She confirms that patient did not eat any breakfast or lunch today. She reports that patient spit out PO medications earlier today.   Patient confirms ongoing pain with swallowing and feeling like things get hung up/stuck in her esophagus. This is worse with solid foods than with liquids. She reports she has been consistently drinking Ensure supplements d/t difficulty with solid foods.  Lunch tray was on bedside table and she did not feel that she would be up to trying to eat any of it.  She reports liking orange sherbet from [employer] because it is icy rather than creamy. Talked with her about adding Boost Breeze and she is receptive to this.  Patient shares that she is sad about inability to go Christmas shopping for her family and that her goal is to be back at home. She shares that she had been working with PT and that she feels it is going ok but that swelling in legs is making her feel weak.  Talked with patient the importance of adequate nutrition and how this will aid in meeting her goals of gaining strength and going home. Introduced the idea of small bore NGT and initiation of TF to meet nutrition needs while still allowing her to work on oral intake. Patient requests to be able to think about this and talk with her nephew about it.   She has not been weighed since 11/29 at which time weight was exactly the same as that recorded on 11/25. Deep pitting edema  to LLE documented in the edema section of flow sheet.     Labs reviewed; CBGs: 98 and 94 mg/dl, Na: 133 mmol/l, BUN:5 mg/dl, creatinine: <0.3 mg/dl, Ca: 8.2 mg/dl.   Medications reviewed; 1 mg folvite/day, sliding scale novolog, 2 g IV Mg sulfate x1 run 12/5, 3 mg melatonin/night, 1 tablet multivitamin with minerals/day  IVF; D5 @ 75 ml/hr (306 kcal/24 hrs).    Diet Order:   Diet Order             DIET DYS 3 Room service appropriate? Yes; Fluid consistency: Thin  Diet effective now                   EDUCATION NEEDS:    Education needs have been addressed  Skin:  Skin Assessment: Skin Integrity Issues: Skin Integrity Issues:: Stage II Stage II: Coccyx  Last BM:  11/26 (type 4)  Height:   Ht Readings from Last 1 Encounters:  09/01/21 5\' 5"  (1.651 m)    Weight:   Wt Readings from Last 1 Encounters:  09/01/21 50.8 kg    Estimated Nutritional Needs:  Kcal:  2100-2300 Protein:  80-95 grams Fluid:  >2.1 L     Jarome Matin, MS, RD, LDN, CNSC Inpatient Clinical Dietitian RD pager # available in [place]  After hours/weekend pager # available in [facility]

## 2021-09-07 NOTE — Progress Notes (Signed)
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 Spot Images: 0 COMPARISON:  None. FINDINGS: A very limited examination was performed in the recumbent LPO position, due to patient condition. Patient had difficulty swallowing a significant amount of contrast. Poor esophageal motility. Suspect a high-grade distal esophageal stricture, best seen on cine imaging. IMPRESSION: 1. Very limited exam performed due to patient condition. 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 2. Poor esophageal motility. Electronically Signed   By: Lorin Picket M.D.   On: 08/31/2021 09:19   US THORACENTESIS ASP PLEURAL SPACE W/IMG GUIDE  Result Date: 08/26/2021 INDICATION: Patient with history of stage IV non-small cell lung cancer, left pleural effusion; request received for diagnostic and therapeutic left thoracentesis. EXAM: ULTRASOUND GUIDED DIAGNOSTIC AND THERAPEUTIC LEFT THORACENTESIS MEDICATIONS: 10 mL 1% lidocaine COMPLICATIONS: None immediate. PROCEDURE: An ultrasound guided thoracentesis was  thoroughly discussed with the patient and questions answered. The benefits, risks, alternatives and complications were also discussed. The patient understands and wishes to proceed with the procedure. Written consent was obtained. Ultrasound was performed to localize and mark an adequate pocket of fluid in the left chest. The area was then prepped and draped in the normal sterile fashion. 1% Lidocaine was used for local anesthesia. Under ultrasound guidance a 6 Fr Safe-T-Centesis catheter was introduced. Thoracentesis was performed. The catheter was removed and a dressing applied. FINDINGS: A total of approximately 320 cc of yellow fluid was removed. Samples were sent to the laboratory as requested by the clinical team. IMPRESSION: Successful ultrasound guided diagnostic and therapeutic left thoracentesis yielding 320 cc of pleural fluid. Read by: Rowe Robert, PA-C Electronically Signed   By: Miachel Roux M.D.   On: 08/26/2021 16:19    ASSESSMENT AND PLAN: This is a 66 year old female with stage IV non-small cell lung cancer, adenosquamous carcinoma who was initially diagnosed with a large cavitary left lower lobe lung mass, cavitary mass in the mediastinum, and left upper lobe nodule in addition to pleuritic mass with pleural and lytic bone lesions.  She was diagnosed in September 2022.  She is now receiving palliative systemic chemotherapy with carboplatin for an AUC of  5, Alimta 500 mg per metered squared, and Keytruda 200 mg IV every 3 weeks.  Status post 3 cycles.  Last cycle was given on 08/25/2021.    During her last chemotherapy, she developed A. fib and was sent to the emergency department.  She was found to have a DVT in her left lower extremity.  She was started on Lovenox.  Due to concern for drop in platelets, Lovenox has been placed on hold.  HIT panel was sent and was negative.  She has been receiving Granix and we plan to continue medication until [place] is 1.5 or higher.  No need for PRBC or  platelet transfusion today.  Recommend PRBC transfusion for hemoglobin less than 7 or platelet transfusion for platelet count less than 20,000 or active bleeding.  The drop in platelets likely related to chemotherapy.  Today is day 12 of cycle #3 of the chemotherapy.  Her counts are slightly improved today.  Anticipate they will continue to improve over the next few days.  We will continue to hold Lovenox for now until platelet count is consistently above 50,000 without recent platelet transfusion (last platelet transfusion 09/05/2021 and she received 2 units of platelets at that time).  Positive care is currently following and goals of care discussion per palliative.  Future Appointments  Date Time Provider [place]  09/09/2021 12:45 PM [name] LAB [name] None  09/10/2021 10:30 AM Gery Pray, MD [name] None  09/16/2021 12:45 PM [name] None  09/16/2021  1:30 PM Curt Bears, MD [name] None  09/16/2021  2:15 PM [name] INFUSION [name] None  09/16/2021  2:30 PM Morrell Riddle, RD [name] None  09/21/2021 10:05 AM Loel Dubonnet, NP [facility]  09/23/2021 12:45 PM [name] None  09/30/2021 12:45 PM [name] None  10/07/2021  9:00 AM [name] None  10/07/2021  9:30 AM Heilingoetter, Cassandra L, PA-C [name] None  10/07/2021 10:15 AM [name] INFUSION [name] None  10/14/2021 12:30 PM [name] LAB [name] None  10/21/2021 12:00 PM [name] None  10/27/2021  9:30 AM [name] None  10/27/2021 10:00 AM Curt Bears, MD [name] None  10/27/2021 10:45 AM [name] INFUSION [name] None  11/17/2021 10:30 AM [name] None  11/17/2021 11:00 AM Curt Bears, MD [name] None  11/17/2021 11:45 AM [name] INFUSION [name] None  12/08/2021 11:00 AM [name]  None  12/08/2021 11:30 AM Curt Bears, MD [name] None  12/08/2021 12:30 PM [name] INFUSION [name] None      LOS: 12 days   Mikey Bussing, DNP, AGPCNP-BC, AOCNP 09/07/21

## 2021-09-08 DIAGNOSIS — D696 Thrombocytopenia, unspecified: Secondary | ICD-10-CM | POA: Diagnosis not present

## 2021-09-08 DIAGNOSIS — D701 Agranulocytosis secondary to cancer chemotherapy: Secondary | ICD-10-CM | POA: Diagnosis not present

## 2021-09-08 DIAGNOSIS — T451X5A Adverse effect of antineoplastic and immunosuppressive drugs, initial encounter: Secondary | ICD-10-CM | POA: Diagnosis not present

## 2021-09-08 DIAGNOSIS — I4891 Unspecified atrial fibrillation: Secondary | ICD-10-CM | POA: Diagnosis not present

## 2021-09-08 DIAGNOSIS — C3492 Malignant neoplasm of unspecified part of left bronchus or lung: Secondary | ICD-10-CM | POA: Diagnosis not present

## 2021-09-08 LAB — CBC WITH DIFFERENTIAL/PLATELET
Abs Immature Granulocytes: 0.06 10*3/uL (ref 0.00–0.07)
Basophils Absolute: 0 10*3/uL (ref 0.0–0.1)
Basophils Relative: 0 %
Eosinophils Absolute: 0 10*3/uL (ref 0.0–0.5)
Eosinophils Relative: 0 %
HCT: 29.8 % — ABNORMAL LOW (ref 36.0–46.0)
Hemoglobin: 9.8 g/dL — ABNORMAL LOW (ref 12.0–15.0)
Immature Granulocytes: 1 %
Lymphocytes Relative: 9 %
Lymphs Abs: 0.4 10*3/uL — ABNORMAL LOW (ref 0.7–4.0)
MCH: 29.5 pg (ref 26.0–34.0)
MCHC: 32.9 g/dL (ref 30.0–36.0)
MCV: 89.8 fL (ref 80.0–100.0)
Monocytes Absolute: 0.8 10*3/uL (ref 0.1–1.0)
Monocytes Relative: 16 %
Neutro Abs: 3.6 10*3/uL (ref 1.7–7.7)
Neutrophils Relative %: 74 %
Platelets: 48 10*3/uL — ABNORMAL LOW (ref 150–400)
RBC: 3.32 MIL/uL — ABNORMAL LOW (ref 3.87–5.11)
RDW: 16.9 % — ABNORMAL HIGH (ref 11.5–15.5)
WBC: 4.9 10*3/uL (ref 4.0–10.5)
nRBC: 0 % (ref 0.0–0.2)

## 2021-09-08 LAB — BASIC METABOLIC PANEL
Anion gap: 6 (ref 5–15)
BUN: <5 mg/dL — ABNORMAL LOW (ref 8–23)
CO2: 26 mmol/L (ref 22–32)
Calcium: 8 mg/dL — ABNORMAL LOW (ref 8.9–10.3)
Chloride: 101 mmol/L (ref 98–111)
Creatinine, Ser: <0.3 mg/dL — ABNORMAL LOW (ref 0.44–1.00)
Glucose, Bld: 86 mg/dL (ref 70–99)
Potassium: 3.5 mmol/L (ref 3.5–5.1)
Sodium: 133 mmol/L — ABNORMAL LOW (ref 135–145)

## 2021-09-08 LAB — GLUCOSE, CAPILLARY
Glucose-Capillary: 84 mg/dL (ref 70–99)
Glucose-Capillary: 96 mg/dL (ref 70–99)
Glucose-Capillary: 86 mg/dL (ref 70–99)
Glucose-Capillary: 102 mg/dL — ABNORMAL HIGH (ref 70–99)

## 2021-09-08 LAB — MAGNESIUM: Magnesium: 1.4 mg/dL — ABNORMAL LOW (ref 1.7–2.4)

## 2021-09-08 MED ORDER — MAGNESIUM OXIDE -MG SUPPLEMENT 400 (240 MG) MG PO TABS
800.0000 mg | ORAL_TABLET | Freq: Every day | ORAL | Status: DC
  Administered 2021-09-09 – 2021-09-13 (×5): 800 mg via ORAL
  Filled 2021-09-08 (×5): qty 2

## 2021-09-08 MED ORDER — MAGNESIUM SULFATE 4 GM/100ML IV SOLN
4.0000 g | Freq: Once | INTRAVENOUS | Status: AC
  Administered 2021-09-08: 4 g via INTRAVENOUS
  Filled 2021-09-08: qty 100
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
## 2021-09-08 NOTE — Progress Notes (Signed)
Occupational Therapy Treatment Patient Details Name: Kim Ross MRN: [id number] DOB: [birth year] Today's Date: 09/08/2021   History of present illness 65yo female who presented on 11/22 after having palpitations while at [facility] center; found to be in Afib with RVR and transferred to ED. PE negative but did have pleural effusion, addressed with thoracentesis on 11/23. PMH cocaine abuse, UTI, DM, stage IV lung CA with brain met on chemo   OT comments  Patient 's HR was 109-120 bpm with activity on this date. Patient was able to take few steps to edge of bed with RW with encouragement from husband and therapist with min A. Patient was able to participate in grooming tasks sitting on edge of bed. Patient would continue to benefit from skilled OT services at this time while admitted and after d/c to address noted deficits in order to improve overall safety and independence in ADLs.     Recommendations for follow up therapy are one component of a multi-disciplinary discharge planning process, led by the attending physician.  Recommendations may be updated based on patient status, additional functional criteria and insurance authorization.    Follow Up Recommendations  Home health OT    Assistance Recommended at Discharge Frequent or constant Supervision/Assistance  Equipment Recommendations  BSC/3in1    Recommendations for Other Services      Precautions / Restrictions Precautions Precautions: Fall Precaution Comments: high fracture risk due CA, L LE pain, watch vitals, Restrictions Weight Bearing Restrictions: No       Mobility Bed Mobility Overal bed mobility: Needs Assistance Bed Mobility: Supine to Sit;Sit to Supine     Supine to sit: Supervision Sit to supine: Min assist   General bed mobility comments: patietn need physical assistance for trunk to get into midline on edge of bed. patient needed assist getting BLE back into bed    Transfers Overall transfer level: Needs  assistance Equipment used: Rolling walker (2 wheels) Transfers: Sit to/from Stand Sit to Stand: Min assist           General transfer comment: patient was able to take few steps to [facility] with RW to increase time out of bed.     Balance                                           ADL either performed or assessed with clinical judgement   ADL Overall ADL's : Needs assistance/impaired     Grooming: Wash/dry hands;Wash/dry face;Set up;Bed level;Oral care;Sitting Grooming Details (indicate cue type and reason): EOB on this date   Upper Body Bathing Details (indicate cue type and reason): patient declined to wash up stating nursing completed earlier on this date.                                Extremity/Trunk Assessment              Vision       Perception     Praxis      Cognition Arousal/Alertness: Awake/alert Behavior During Therapy: Flat affect Overall Cognitive Status: Within Functional Limits for tasks assessed  Exercises     Shoulder Instructions       General Comments      Pertinent Vitals/ Pain       Pain Assessment: Faces Faces Pain Scale: Hurts little more Pain Location: back Pain Descriptors / Indicators: Aching;Grimacing Pain Intervention(s): Monitored during session  Home Living                                          Prior Functioning/Environment              Frequency  Min 2X/week        Progress Toward Goals  OT Goals(current goals can now be found in the care plan section)  Progress towards OT goals: OT to reassess next treatment  ADL Goals Pt Will Perform Lower Body Dressing: with modified independence;sit to/from stand;sitting/lateral leans Additional ADL Goal #1: Patient will perform 10 min functional activity or exercise activity as evidence of improving activity tolerance  Plan Discharge plan remains  appropriate    Co-evaluation                 AM-PAC OT "6 Clicks" Daily Activity     Outcome Measure   Help from another person eating meals?: A Little Help from another person taking care of personal grooming?: A Little Help from another person toileting, which includes using toliet, bedpan, or urinal?: A Lot Help from another person bathing (including washing, rinsing, drying)?: A Lot Help from another person to put on and taking off regular upper body clothing?: A Little Help from another person to put on and taking off regular lower body clothing?: A Lot 6 Click Score: 15    End of Session Equipment Utilized During Treatment: Gait belt;Rolling walker (2 wheels)  OT Visit Diagnosis: Unsteadiness on feet (R26.81);Muscle weakness (generalized) (M62.81);History of falling (Z91.81);Pain   Activity Tolerance Patient limited by fatigue   Patient Left in bed;with call bell/phone within reach;with bed alarm set   Nurse Communication          Time: 9038-3338 OT Time Calculation (min): 19 min  Charges: OT General Charges $OT Visit: 1 Visit OT Treatments $Self Care/Home Management : 8-22 mins  Jackelyn Poling OTR/L, [facility] Office# [phone] Pager# [phone]   Marcellina Millin 09/08/2021, 1:31 PM

## 2021-09-08 NOTE — Progress Notes (Signed)
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 fluoroscopic device): 8.5 mGy Number of Acquired Spot Images: 0 COMPARISON:  None. FINDINGS: A very limited examination was performed in the recumbent LPO position, due to patient condition. Patient had difficulty swallowing a significant amount of contrast. Poor esophageal motility. Suspect a high-grade distal esophageal stricture, best seen on cine imaging. IMPRESSION: 1. Very limited exam performed due to patient condition. 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 2. Poor esophageal motility. Electronically Signed   By: Lorin Picket M.D.   On: 08/31/2021 09:19   US THORACENTESIS ASP PLEURAL SPACE W/IMG GUIDE  Result Date: 08/26/2021 INDICATION: Patient with history of stage IV non-small cell lung cancer, left pleural effusion; request received for diagnostic and therapeutic left thoracentesis. EXAM: ULTRASOUND GUIDED DIAGNOSTIC AND THERAPEUTIC LEFT THORACENTESIS MEDICATIONS: 10 mL  1% lidocaine COMPLICATIONS: None immediate. PROCEDURE: An ultrasound guided thoracentesis was thoroughly discussed with the patient and questions answered. The benefits, risks, alternatives and complications were also discussed. The patient understands and wishes to proceed with the procedure. Written consent was obtained. Ultrasound was performed to localize and mark an adequate pocket of fluid in the left chest. The area was then prepped and draped in the normal sterile fashion. 1% Lidocaine was used for local anesthesia. Under ultrasound guidance a 6 Fr Safe-T-Centesis catheter was introduced. Thoracentesis was performed. The catheter was removed and a dressing applied. FINDINGS: A total of approximately 320 cc of yellow fluid was removed. Samples were sent to the laboratory as requested by the clinical team. IMPRESSION: Successful ultrasound guided diagnostic and therapeutic left thoracentesis yielding 320 cc of pleural fluid. Read by: Rowe Robert, PA-C Electronically Signed   By: Miachel Roux M.D.   On: 08/26/2021 16:19    ASSESSMENT AND PLAN: This is a 65 year old female with stage IV non-small cell lung cancer, adenosquamous carcinoma who was initially diagnosed with a large cavitary left lower lobe lung mass, cavitary mass in the mediastinum, and left upper lobe nodule in addition to pleuritic mass  with pleural and lytic bone lesions.  She was diagnosed in September 2022.  She is now receiving palliative systemic chemotherapy with carboplatin for an AUC of 5, Alimta 500 mg per metered squared, and Keytruda 200 mg IV every 3 weeks.  Status post 3 cycles.  Last cycle was given on 08/25/2021.    During her last chemotherapy, she developed A. fib and was sent to the emergency department.  She was found to have a DVT in her left lower extremity.  She was started on Lovenox.  Due to concern for drop in platelets, Lovenox has been placed on hold.  HIT panel was sent and was negative.  She has been receiving  Granix today her WBC is 4.9 and ANC is 3.6.  I have discontinued Granix.  There is no need for PRBC or platelet transfusion today.  Recommend PRBC transfusion for hemoglobin less than 7 or platelet count less than 20,000 or active bleeding.  The drop in the platelets is likely related to her recent chemotherapy.  Is day 13 of cycle #3 of her chemo.  She received 2 units of platelets over the weekend and platelets are slowly drifting down.  Recommend continuing to hold Lovenox for now until platelet count is 50,000 or higher consistently without transfusion support.  Discussed case this morning with palliative care NP who continues to follow the patient.  Future Appointments  Date Time Provider [place]  09/09/2021 12:45 PM [name] None  09/10/2021 10:30 AM Gery Pray, MD [name] None  09/16/2021 12:45 PM [name] None  09/16/2021  1:30 PM Curt Bears, MD [name] None  09/16/2021  2:15 PM [name] INFUSION [name] None  09/16/2021  2:30 PM Morrell Riddle, RD [name] None  09/21/2021 10:05 AM Loel Dubonnet, NP [facility]  09/23/2021 12:45 PM [name] None  09/30/2021 12:45 PM [name] None  10/07/2021  9:00 AM [name] None  10/07/2021  9:30 AM Heilingoetter, Cassandra L, PA-C [name] None  10/07/2021 10:15 AM [name] INFUSION [name] None  10/14/2021 12:30 PM [name] None  10/21/2021 12:00 PM [name] None  10/27/2021  9:30 AM [name] None  10/27/2021 10:00 AM Curt Bears, MD [name] None  10/27/2021 10:45 AM [name] INFUSION [name] None  11/17/2021 10:30 AM [name] None  11/17/2021 11:00 AM Curt Bears, MD [name] None  11/17/2021 11:45 AM [name] INFUSION [name] None  12/08/2021 11:00 AM [name] None  12/08/2021  11:30 AM Curt Bears, MD [name] None  12/08/2021 12:30 PM [name] INFUSION [name] None      LOS: 13 days   Mikey Bussing, DNP, AGPCNP-BC, AOCNP 09/08/21

## 2021-09-08 NOTE — Progress Notes (Signed)
Patient told RN that she wants to still think about whether or not she would like to have a feeding tube placed. RN made MD aware.

## 2021-09-08 NOTE — Care Management Important Message (Signed)
Important Message  Patient Details IM Letter given to the Patient. Name: Kim Ross MRN: [id number] Date of Birth: 1956/04/23   Medicare Important Message Given:  Yes     Kerin Salen 09/08/2021, 12:15 PM

## 2021-09-09 ENCOUNTER — Inpatient Hospital Stay: Payer: 59 | Attending: Physician Assistant

## 2021-09-09 LAB — CBC WITH DIFFERENTIAL/PLATELET
Abs Immature Granulocytes: 0.25 10*3/uL — ABNORMAL HIGH (ref 0.00–0.07)
Basophils Absolute: 0 10*3/uL (ref 0.0–0.1)
Basophils Relative: 0 %
Eosinophils Absolute: 0 10*3/uL (ref 0.0–0.5)
Eosinophils Relative: 0 %
HCT: 30.8 % — ABNORMAL LOW (ref 36.0–46.0)
Hemoglobin: 9.8 g/dL — ABNORMAL LOW (ref 12.0–15.0)
Immature Granulocytes: 3 %
Lymphocytes Relative: 8 %
Lymphs Abs: 0.6 10*3/uL — ABNORMAL LOW (ref 0.7–4.0)
MCH: 29.2 pg (ref 26.0–34.0)
MCHC: 31.8 g/dL (ref 30.0–36.0)
MCV: 91.7 fL (ref 80.0–100.0)
Monocytes Absolute: 1 10*3/uL (ref 0.1–1.0)
Monocytes Relative: 12 %
Neutro Abs: 6.2 10*3/uL (ref 1.7–7.7)
Neutrophils Relative %: 77 %
Platelets: 32 10*3/uL — ABNORMAL LOW (ref 150–400)
RBC: 3.36 MIL/uL — ABNORMAL LOW (ref 3.87–5.11)
RDW: 17.2 % — ABNORMAL HIGH (ref 11.5–15.5)
WBC: 8.1 10*3/uL (ref 4.0–10.5)
nRBC: 0 % (ref 0.0–0.2)

## 2021-09-09 LAB — BASIC METABOLIC PANEL
Anion gap: 7 (ref 5–15)
BUN: <5 mg/dL — ABNORMAL LOW (ref 8–23)
CO2: 28 mmol/L (ref 22–32)
Calcium: 8.1 mg/dL — ABNORMAL LOW (ref 8.9–10.3)
Chloride: 99 mmol/L (ref 98–111)
Creatinine, Ser: 0.4 mg/dL — ABNORMAL LOW (ref 0.44–1.00)
GFR, Estimated: >60 mL/min (ref >60)
Glucose, Bld: 92 mg/dL (ref 70–99)
Potassium: 3.3 mmol/L — ABNORMAL LOW (ref 3.5–5.1)
Sodium: 134 mmol/L — ABNORMAL LOW (ref 135–145)

## 2021-09-09 LAB — GLUCOSE, CAPILLARY
Glucose-Capillary: 113 mg/dL — ABNORMAL HIGH (ref 70–99)
Glucose-Capillary: 108 mg/dL — ABNORMAL HIGH (ref 70–99)
Glucose-Capillary: 100 mg/dL — ABNORMAL HIGH (ref 70–99)

## 2021-09-09 LAB — MAGNESIUM: Magnesium: 1.7 mg/dL (ref 1.7–2.4)

## 2021-09-09 MED ORDER — POTASSIUM CHLORIDE CRYS ER 20 MEQ PO TBCR
40.0000 meq | EXTENDED_RELEASE_TABLET | Freq: Once | ORAL | Status: AC
  Administered 2021-09-09: 40 meq via ORAL
  Filled 2021-09-09: qty 2

## 2021-09-09 MED ORDER — POTASSIUM CHLORIDE 20 MEQ PO PACK
40.0000 meq | PACK | ORAL | Status: DC
  Administered 2021-09-09: 40 meq via ORAL
  Filled 2021-09-09 (×2): qty 2

## 2021-09-09 MED ORDER — GUAIFENESIN-DM 100-10 MG/5ML PO SYRP
5.0000 mL | ORAL_SOLUTION | ORAL | Status: DC | PRN
  Administered 2021-09-09: 5 mL via ORAL
  Filled 2021-09-09: qty 10

## 2021-09-09 NOTE — Progress Notes (Incomplete)
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 - No cystic structure found in the popliteal fossa.  *See table(s) above for measurements and observations. Electronically signed by Harold Barban MD on 08/27/2021 at 12:53:01 AM.    Final    ECHOCARDIOGRAM LIMITED  Result Date: 08/26/2021    ECHOCARDIOGRAM LIMITED REPORT   Patient Name:   KAISY SEVERINO Date of Exam: 08/26/2021 Medical Rec #:  [id number]      Height:       65.0 in Accession #:    [id number]     Weight:       130.0 lb Date of Birth:  [birth year]       BSA:          1.647 m Patient Age:    30 years       BP:           93/69 mmHg Patient Gender: F              HR:           84 bpm. Exam Location:  Inpatient Procedure: Limited Echo, Limited Color Doppler and Cardiac Doppler Indications:    I48.91* Unspeicified atrial fibrillation  History:        Patient has prior history of Echocardiogram examinations, most                 recent 05/05/2021.  Sonographer:    Bernadene Person RDCS Referring Phys: [place]  1. Left ventricular ejection  fraction, by estimation, is 55 to 60%. The left ventricle has normal function. The left ventricle has no regional wall motion abnormalities.  2. Right ventricular systolic function is normal. The right ventricular size is normal. There is normal pulmonary artery systolic pressure. The estimated right ventricular systolic pressure is 85.8 mmHg.  3. The mitral valve is normal in structure. Trivial mitral valve regurgitation. No evidence of mitral stenosis.  4. The aortic valve was not well visualized. Aortic valve regurgitation is  not visualized. No aortic stenosis is present.  5. The inferior vena cava is normal in size with greater than 50% respiratory variability, suggesting right atrial pressure of 3 mmHg. FINDINGS  Left Ventricle: Left ventricular ejection fraction, by estimation, is 55 to 60%. The left ventricle has normal function. The left ventricle has no regional wall motion abnormalities. The left ventricular internal cavity size was normal in size. There is  no left ventricular hypertrophy. Right Ventricle: The right ventricular size is normal. No increase in right ventricular wall thickness. Right ventricular systolic function is normal. There is normal pulmonary artery systolic pressure. The tricuspid regurgitant velocity is 2.78 m/s, and  with an assumed right atrial pressure of 3 mmHg, the estimated right ventricular systolic pressure is 09.3 mmHg. Pericardium: There is no evidence of pericardial effusion. Mitral Valve: The mitral valve is normal in structure. Trivial mitral valve regurgitation. No evidence of mitral valve stenosis. Tricuspid Valve: The tricuspid valve is normal in structure. Tricuspid valve regurgitation is trivial. Aortic Valve: The aortic valve was not well visualized. Aortic valve regurgitation is not visualized. No aortic stenosis is present. Pulmonic Valve: The pulmonic valve was not well visualized. Pulmonic valve regurgitation is not visualized. Aorta: The aortic root is normal  in size and structure. Venous: The inferior vena cava is normal in size with greater than 50% respiratory variability, suggesting right atrial pressure of 3 mmHg. LEFT VENTRICLE PLAX 2D LVIDd:         3.50 cm   Diastology LVIDs:         2.60 cm   LV e' medial:    8.59 cm/s LV PW:         0.80 cm   LV E/e' medial:  10.1 LV IVS:        0.80 cm   LV e' lateral:   6.31 cm/s LVOT diam:     1.70 cm   LV E/e' lateral: 13.8 LV SV:         32 LV SV Index:   19 LVOT Area:     2.27 cm  RIGHT VENTRICLE RV S prime:     8.67 cm/s TAPSE (M-mode): 1.4 cm LEFT ATRIUM         Index LA diam:    2.50 cm 1.52 cm/m  AORTIC VALVE LVOT Vmax:   80.60 cm/s LVOT Vmean:  49.700 cm/s LVOT VTI:    0.141 m  AORTA Ao Root diam: 2.80 cm MITRAL VALVE               TRICUSPID VALVE MV Area (PHT): 5.02 cm    TR Peak grad:   30.9 mmHg MV Decel Time: 151 msec    TR Vmax:        278.00 cm/s MV E velocity: 87.00 cm/s MV A velocity: 56.60 cm/s  SHUNTS MV E/A ratio:  1.54        Systemic VTI:  0.14 m                            Systemic Diam: 1.70 cm Oswaldo Milian MD Electronically signed by Oswaldo Milian MD Signature Date/Time: 08/26/2021/4:29:54 PM    Final    DG ESOPHAGUS W SINGLE CM (SOL OR THIN BA)  Result Date: 08/31/2021 CLINICAL DATA:  Trouble in pain with swallowing for 3-4 months. EXAM: ESOPHAGUS/BARIUM SWALLOW/TABLET STUDY TECHNIQUE: Initial scout AP supine abdominal image obtained to insure adequate colon cleansing. Barium was introduced into the colon  in a retrograde fashion and refluxed from the rectum to the cecum. Spot images of the colon followed by overhead radiographs were obtained. FLUOROSCOPY TIME:  Fluoroscopy Time:  2 minutes 18 seconds Radiation Exposure Index (if provided by the fluoroscopic device): 8.5 mGy Number of Acquired Spot Images: 0 COMPARISON:  None. FINDINGS: A very limited examination was performed in the recumbent LPO position, due to patient condition. Patient had difficulty swallowing a significant  amount of contrast. Poor esophageal motility. Suspect a high-grade distal esophageal stricture, best seen on cine imaging. IMPRESSION: 1. Very limited exam performed due to patient condition. 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 2. Poor esophageal motility. Electronically Signed   By: Lorin Picket M.D.   On: 08/31/2021 09:19   US THORACENTESIS ASP PLEURAL SPACE W/IMG GUIDE  Result Date: 08/26/2021 INDICATION: Patient with history of stage IV non-small cell lung cancer, left pleural effusion; request received for diagnostic and therapeutic left thoracentesis. EXAM: ULTRASOUND GUIDED DIAGNOSTIC AND THERAPEUTIC LEFT THORACENTESIS MEDICATIONS: 10 mL 1% lidocaine COMPLICATIONS: None immediate. PROCEDURE: An ultrasound guided thoracentesis was thoroughly discussed with the patient and questions answered. The benefits, risks, alternatives and complications were also discussed. The patient understands and wishes to proceed with the procedure. Written consent was obtained. Ultrasound was performed to localize and mark an adequate pocket of fluid in the left chest. The area was then prepped and draped in the normal sterile fashion. 1% Lidocaine was used for local anesthesia. Under ultrasound guidance a 6 Fr Safe-T-Centesis catheter was introduced. Thoracentesis was performed. The catheter was removed and a dressing applied. FINDINGS: A total of approximately 320 cc of yellow fluid was removed. Samples were sent to the laboratory as requested by the clinical team. IMPRESSION: Successful ultrasound guided diagnostic and therapeutic left thoracentesis yielding 320 cc of pleural fluid. Read by: Rowe Robert, PA-C Electronically Signed   By: Miachel Roux M.D.   On: 08/26/2021 16:19    Impression: Brain metastasis   Cancer Staging  Adenocarcinoma of left lung, stage 4 (HCC) Staging form: Lung, AJCC 8th Edition - Clinical: Stage IVB (cT4, cN3, cM1c) - Signed by Curt Bears, MD on 07/08/2021   The patient is recovering from the effects of radiation.  ***  Plan:  ***   *** minutes of total time was spent for this patient encounter, including preparation, face-to-face counseling with the patient and coordination of care, physical exam, and documentation of the encounter. ____________________________________  Blair Promise, PhD, MD   This document serves as a record of services personally performed by Gery Pray, MD. It was created on his behalf by Roney Mans, a trained medical scribe. The creation of this record is based on the scribe's personal observations and the provider's statements to them. This document has been checked and approved by the attending provider.

## 2021-09-09 NOTE — TOC Progression Note (Signed)
Transition of Care [facility]) - Progression Note    Patient Details  Name: Kim Ross MRN: [id number] Date of Birth: [birth year]  [facility] CM/SW Contact  Mavrik Bynum, Juliann Pulse, RN Phone Number: 09/09/2021, 1:38 PM  Clinical Narrative: DNR,Palliative care following;medical oncology following;possible IR cons for GT-continue to follow. [facility] w/c, & cushion following.      Expected Discharge Plan:  (TBD) Barriers to Discharge: Continued Medical Work up  Expected Discharge Plan and Services Expected Discharge Plan:  (TBD)   Discharge Planning Services: CM Consult   Living arrangements for the past 2 months: Single Family Home                                       Social Determinants of Health (SDOH) Interventions    Readmission Risk Interventions No flowsheet data found.

## 2021-09-09 NOTE — Progress Notes (Signed)
Palliative Medicine Inpatient Follow Up Note     Chart Reviewed. Patient assessed at the bedside.   Kim Ross is resting in bed watching television. Denies pain currently but does complain of intermittent generalized pain.   Continues to appear frail and weak. Appetite remains poor despite efforts to improve. She states she just doesn't have an appetite in addition to some dysphagia. She is s/p EGD w/esophageal dilation on 09/01/21.   I reviewed at length previously discussed goals of care discussions. Kim Ross confirms her wishes for DNR/DNI however wishes to continue to treat the treatable aggressively.   We discussed at length her severe malnutrition, weight loss, and her inability to sustain life long-term. She verbalizes understanding and shares she is open to placement of PEG tube to assist in her nutrition and stability. Risk and benefits discussed in addition to focus on her overall functional state, poor prognosis and no meaningful recovery. She expresses wishes to proceed with PEG if able. Education provided that currently she would not be a candidate given her low platelet count. She becomes frustrated expressing if this was not going to be an option why was it introduced. Support provided. I reviewed that in previous discussions she expressed she would not want a feeding tube and at that point would wish to focus on her comfort and spending time with her family. She verbalized remembrance expressing she accepts that her condition is declining and awareness of disease trajectory however would like to at least attempt a timed trial if able.   I had an open and honest discussion with Kim Ross with concerns for her ability to recover, poor prognosis, with recommendations for care to focus on her comfort for what time she has left. She is emotional and expresses her understanding however would like to continue to take things day by day before making comfort and hospice her final decision. She  states she plans to have a private discussion with her boyfriend as she would want him to know what decisions she is facing. She is sadden at the thought of having to tell her 42 year old daughter and grandbaby who both live in the home that she may not live much longer. Emotional support provided and Kim Ross knows I am available and can support her with he family discussions if she wishes. She would like to speak with them herself but will call if they have questions or she need added support.  Discussed the importance of continued conversation with family and their  medical providers regarding overall plan of care and treatment options, ensuring decisions are within the context of the patients values and GOCs.   Questions addressed and support provided.    Objective Assessment: Vital Signs Vitals:   09/09/21 1131 09/09/21 1953  BP: 129/78 109/76  Pulse: 96 100  Resp: 18 16  Temp: 97.9 F (36.6 C) 98.6 F (37 C)  SpO2: 98% 100%    Intake/Output Summary (Last 24 hours) at 09/09/2021 2327 Last data filed at 09/09/2021 1522 Gross per 24 hour  Intake --  Output 1200 ml  Net -1200 ml   Last Weight  Most recent update: 09/01/2021  9:41 AM    Weight  50.8 kg (111 lb 15.9 oz)            Gen:  Weak, frail, cachectic, ill-appearing  CV: Regular rate and rhythm PULM: diminished bilaterally ABD: soft/nontender/nondistended/normal bowel sounds Neuro: Alert and oriented x3, mood appropriate  SUMMARY OF RECOMMENDATIONS   Continue with current plan  of care per medical team  Patient expresses if she is able to have PEG tube she would want to proceed for timed trial despite initial [place] discussions being against this.  Detailed goals of care discussion with recommendations for all care to focus on comfort with consideration of hospice support outpatient. Kim Ross verbalized understanding however wishes to have family discussions. She will let me know if she needs our support with  discussions. Prognosis remains poor and placement of PEG will not provide any meaningful recovery or improvement.  Encouraged ongoing discussions  PMT will continue to support and follow on as needed basis. Please secure chat for urgent needs.    Time Total: 45 min.   Visit consisted of counseling and education dealing with the complex and emotionally intense issues of symptom management and palliative care in the setting of serious and potentially life-threatening illness.Greater than 50%  of this time was spent counseling and coordinating care related to the above assessment and plan.  Alda Lea, AGPCNP-BC  Palliative Medicine Team [phone]  Palliative Medicine Team providers are available by phone from 7am to 7pm daily and can be reached through the team cell phone. Should this patient require assistance outside of these hours, please call the patient's attending physician.
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
## 2021-09-09 NOTE — Progress Notes (Incomplete)
  Radiation Oncology         [phone]) [phone] ________________________________  Patient Name: Kim Ross MRN: [id number] DOB: [birth year] Referring Physician: Erskine Emery Date of Service: 08/04/2021 [facility], [facility]olai  End Of Treatment Note  Diagnoses: C79.31-Secondary malignant neoplasm of brain  Cancer Staging:   Brain metastasis   Cancer Staging  Adenocarcinoma of left lung, stage 4 ([employer]) Staging form: Lung, AJCC 8th Edition - Clinical: Stage IVB (cT4, cN3, cM1c) - Signed by Curt Bears, MD on 07/08/2021   Intent: Palliative  Radiation Treatment Dates: 07/13/2021 through 08/04/2021 Site Technique Total Dose (Gy) Dose per Fx (Gy) Completed Fx Beam Energies  Brain: Brain_SRS 3D 20/20 20 1/1 6XFFF  Chest, Left: Chest_Lt 3D 35/35 2.5 14/14 6X, 10X   Narrative: The patient tolerated radiation therapy relatively well. She reports having pain to low back rated at a 7/10, a low energy level, shortness of breath on exertion, poor appetite, nausea, and vomiting. Patient denies skin issues and any swallowing issues at this time.  On physical exam, decreased breath sounds are noted throughout the left lung field.  The right lung is clear to ausculation. The patient is sitting comfortably in her wheel chair and is accompanied by her husband.   Plan: The patient will follow-up with radiation oncology in one month .  ________________________________________________ -----------------------------------  Blair Promise, PhD, MD  This document serves as a record of services personally performed by Gery Pray, MD. It was created on his behalf by Roney Mans, a trained medical scribe. The creation of this record is based on the scribe's personal observations and the provider's statements to them. This document has been checked and approved by the attending provider.

## 2021-09-09 NOTE — Plan of Care (Signed)
  Problem: Education: Goal: Knowledge of General Education information will improve Description: Including pain rating scale, medication(s)/side effects and non-pharmacologic comfort measures Outcome: Progressing   Problem: Health Behavior/Discharge Planning: Goal: Ability to manage health-related needs will improve Outcome: Progressing   Problem: Clinical Measurements: Goal: Will remain free from infection Outcome: Progressing   Problem: Coping: Goal: Level of anxiety will decrease Outcome: Progressing   Problem: Elimination: Goal: Will not experience complications related to bowel motility Outcome: Progressing Goal: Will not experience complications related to urinary retention Outcome: Progressing   Problem: Safety: Goal: Ability to remain free from injury will improve Outcome: Progressing   Problem: Clinical Measurements: Goal: Ability to maintain clinical measurements within normal limits will improve Outcome: Not Progressing Goal: Diagnostic test results will improve Outcome: Not Progressing Goal: Respiratory complications will improve Outcome: Not Progressing Goal: Cardiovascular complication will be avoided Outcome: Not Progressing   Problem: Activity: Goal: Risk for activity intolerance will decrease Outcome: Not Progressing   Problem: Nutrition: Goal: Adequate nutrition will be maintained Outcome: Not Progressing   Problem: Pain Managment: Goal: General experience of comfort will improve Outcome: Not Progressing   Problem: Skin Integrity: Goal: Risk for impaired skin integrity will decrease Outcome: Not Progressing

## 2021-09-10 ENCOUNTER — Ambulatory Visit
Admission: RE | Admit: 2021-09-10 | Discharge: 2021-09-10 | Disposition: A | Discharge status: Home / Self Care | Payer: 59 | Source: Ambulatory Visit | Attending: Radiation Oncology | Admitting: Radiation Oncology

## 2021-09-10 HISTORY — DX: Personal history of irradiation: Z92.3

## 2021-09-10 LAB — BASIC METABOLIC PANEL
Anion gap: 7 (ref 5–15)
BUN: <5 mg/dL — ABNORMAL LOW (ref 8–23)
CO2: 27 mmol/L (ref 22–32)
Calcium: 8 mg/dL — ABNORMAL LOW (ref 8.9–10.3)
Chloride: 99 mmol/L (ref 98–111)
Creatinine, Ser: <0.3 mg/dL — ABNORMAL LOW (ref 0.44–1.00)
Glucose, Bld: 90 mg/dL (ref 70–99)
Potassium: 3.3 mmol/L — ABNORMAL LOW (ref 3.5–5.1)
Sodium: 133 mmol/L — ABNORMAL LOW (ref 135–145)

## 2021-09-10 LAB — GLUCOSE, CAPILLARY
Glucose-Capillary: 83 mg/dL (ref 70–99)
Glucose-Capillary: 91 mg/dL (ref 70–99)
Glucose-Capillary: 96 mg/dL (ref 70–99)
Glucose-Capillary: 89 mg/dL (ref 70–99)

## 2021-09-10 LAB — MAGNESIUM: Magnesium: 1.2 mg/dL — ABNORMAL LOW (ref 1.7–2.4)

## 2021-09-10 MED ORDER — POTASSIUM CHLORIDE CRYS ER 20 MEQ PO TBCR
30.0000 meq | EXTENDED_RELEASE_TABLET | ORAL | Status: AC
  Administered 2021-09-10 (×2): 30 meq via ORAL
  Filled 2021-09-10 (×2): qty 1
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
## 2021-09-10 NOTE — Progress Notes (Signed)
Brief oncology note:  CBC from this morning has been reviewed.  WBC has improved to 10.6 ANC of 8.1, globin 10.0, and platelets have drifted down to 22,000.  She has no active bleeding this morning.  Granix has been discontinued.  No need for PRBC transfusion today.  Unfortunately, platelets continue to drift down.  This is likely due to her recent chemotherapy and we are hopeful that her platelet count will slowly improve with time.  Recommend platelet transfusion for platelet count less than 20,000 or active bleeding.  Recommend PRBC transfusion for hemoglobin less than 8.  She will need to remain off anticoagulation until platelet count is 50,000 or higher consistently without transfusion support.  Chart has also been reviewed and note patient request for feeding tube placement.  We can consider being to placement if platelet count is 50,000 or higher without transfusion support and if this remains in line with patient's goals of care.  Discussed with hospitalist and palliative care this morning and they will continue ongoing goals of care discussion with the patient.  Mikey Bussing, DNP, AGPCNP-BC, AOCNP

## 2021-09-10 NOTE — Plan of Care (Signed)
  Problem: Education: Goal: Knowledge of General Education information will improve Description: Including pain rating scale, medication(s)/side effects and non-pharmacologic comfort measures Outcome: Progressing   Problem: Clinical Measurements: Goal: Will remain free from infection Outcome: Progressing   Problem: Coping: Goal: Level of anxiety will decrease Outcome: Progressing   Problem: Elimination: Goal: Will not experience complications related to bowel motility Outcome: Progressing Goal: Will not experience complications related to urinary retention Outcome: Progressing   Problem: Pain Managment: Goal: General experience of comfort will improve Outcome: Progressing   Problem: Health Behavior/Discharge Planning: Goal: Ability to manage health-related needs will improve Outcome: Not Progressing   Problem: Clinical Measurements: Goal: Ability to maintain clinical measurements within normal limits will improve Outcome: Not Progressing Goal: Diagnostic test results will improve Outcome: Not Progressing Goal: Respiratory complications will improve Outcome: Not Progressing Goal: Cardiovascular complication will be avoided Outcome: Not Progressing   Problem: Activity: Goal: Risk for activity intolerance will decrease Outcome: Not Progressing   Problem: Nutrition: Goal: Adequate nutrition will be maintained Outcome: Not Progressing

## 2021-09-10 NOTE — Progress Notes (Signed)
Occupational Therapy Treatment Patient Details Name: Kim Ross MRN: [id number] DOB: 1955-10-08 Today's Date: 09/10/2021   History of present illness 65yo female who presented on 11/22 after having palpitations while at [facility]; found to be in Afib with RVR and transferred to ED. PE negative but did have pleural effusion, addressed with thoracentesis on 11/23. PMH cocaine abuse, UTI, DM, stage IV lung CA with brain met on chemo   OT comments  Patient progressing and showed improved ability to tolerate sit to stands from low EOB x 2 reps with Mod As each repetition and quick to fatigue therefore unable to engage in standing ADLs, but able to take ~3 lateral steps with RW, compared to previous session. Patient remains limited by back and LLE pain, poor appetite, generalized weakness and decreased activity tolerance along with deficits noted below. Pt continues to demonstrate good rehab potential and is motivated despite fatigue and nausea, and could benefit from continued skilled OT to increase safety and independence with ADLs and functional transfers to allow pt to return home safely and reduce caregiver burden and fall risk.    Recommendations for follow up therapy are one component of a multi-disciplinary discharge planning process, led by the attending physician.  Recommendations may be updated based on patient status, additional functional criteria and insurance authorization.    Follow Up Recommendations  Home health OT    Assistance Recommended at Discharge Frequent or constant Supervision/Assistance  Equipment Recommendations  BSC/3in1    Recommendations for Other Services      Precautions / Restrictions Precautions Precautions: Fall Precaution Comments: high fracture risk due CA, L LE pain, watch vitals, Restrictions Weight Bearing Restrictions: No       Mobility Bed Mobility Overal bed mobility: Needs Assistance Bed Mobility: Supine to Sit;Sit to Supine Rolling: Min  guard   Supine to sit: Min guard Sit to supine: Min assist   General bed mobility comments: Pt required Min guard assistance for trunk to get into midline on edge of bed. patient needed assist getting BLE back into bed. Needed assistance with latearl scoot to upper body to get to midline of bed.    Transfers Overall transfer level: Needs assistance Equipment used: Rolling walker (2 wheels) Transfers: Sit to/from Stand Sit to Stand: Mod assist (2 reps from low EOB)   Step pivot transfers: Mod assist       General transfer comment: Patient was able to take lateral few steps to [facility] with RW to increase time out of bed.  Cues for each sit to stand and stand to sit, rocking motion preceeding each to promote anterior weight shift.     Balance Overall balance assessment: Needs assistance Sitting-balance support: Bilateral upper extremity supported;Feet unsupported Sitting balance-Leahy Scale: Fair     Standing balance support: Bilateral upper extremity supported;During functional activity;Reliant on assistive device for balance Standing balance-Leahy Scale: Poor Standing balance comment: reliant on  support of therapist                           ADL either performed or assessed with clinical judgement   ADL Overall ADL's : Needs assistance/impaired   Eating/Feeding Details (indicate cue type and reason): Pt refusing lunch tray. Feels nauseated. Grooming: Wash/dry hands;Sitting;Set up                       [place] and Hygiene: Total assistance;Bed level Toileting - Clothing Manipulation Details (indicate cue  type and reason): Pt with foley cath and rectal tube     Functional mobility during ADLs: Moderate assistance;Minimal assistance;Rolling walker (2 wheels)      Extremity/Trunk Assessment Upper Extremity Assessment Upper Extremity Assessment: Generalized weakness   Lower Extremity Assessment Lower Extremity Assessment:  Generalized weakness   Cervical / Trunk Assessment Cervical / Trunk Assessment: Normal    Vision Patient Visual Report: No change from baseline     Perception     Praxis      Cognition Arousal/Alertness: Awake/alert Behavior During Therapy: WFL for tasks assessed/performed Overall Cognitive Status: Within Functional Limits for tasks assessed                                 General Comments: states that she will only sit on bed edge at beginning of therapy, then agreed to stand.          Exercises     Shoulder Instructions       General Comments      Pertinent Vitals/ Pain       Pain Assessment: Faces (denied pain at rest) Faces Pain Scale: Hurts little more Pain Location: back (LLE with positioning. Pt reports clot) Pain Descriptors / Indicators: Aching;Grimacing Pain Intervention(s): Monitored during session;Limited activity within patient's tolerance  Home Living                                          Prior Functioning/Environment              Frequency  Min 2X/week        Progress Toward Goals  OT Goals(current goals can now be found in the care plan section)  Progress towards OT goals: Progressing toward goals  Acute Rehab OT Goals Patient Stated Goal: Strengthen LEs adn increase energy OT Goal Formulation: With patient Time For Goal Achievement: 09/17/21 Potential to Achieve Goals: Good  Plan Discharge plan remains appropriate    Co-evaluation                 AM-PAC OT "6 Clicks" Daily Activity     Outcome Measure   Help from another person eating meals?: A Little Help from another person taking care of personal grooming?: A Little Help from another person toileting, which includes using toliet, bedpan, or urinal?: A Lot Help from another person bathing (including washing, rinsing, drying)?: A Lot Help from another person to put on and taking off regular upper body clothing?: A Little Help from  another person to put on and taking off regular lower body clothing?: A Lot (could reach down to pull up on top of socks.) 6 Click Score: 15    End of Session Equipment Utilized During Treatment: Gait belt;Rolling walker (2 wheels)  OT Visit Diagnosis: Unsteadiness on feet (R26.81);Muscle weakness (generalized) (M62.81);History of falling (Z91.81);Pain   Activity Tolerance Patient limited by fatigue   Patient Left in bed;with call bell/phone within reach;with bed alarm set (fresh bed pad)   Nurse Communication Mobility status        Time: 1335-1400 OT Time Calculation (min): 25 min  Charges: OT General Charges $OT Visit: 1 Visit OT Treatments $Self Care/Home Management : 8-22 mins $Therapeutic Activity: 8-22 mins  Anderson Malta, OT Acute Rehab Services Office: [phone] 09/10/2021  Julien Girt 09/10/2021, 2:23 PM

## 2021-09-10 NOTE — Progress Notes (Signed)
PT Cancellation Note  Patient Details Name: Kim Ross MRN: [id number] DOB: 01/26/1956   Cancelled Treatment:    Reason Eval/Treat Not Completed: Other (comment) Pt just finished working with OT.  WIll check back as schedule permits.   [name] 09/10/2021, 2:13 PM Arlyce Dice, DPT Acute Rehabilitation Services Pager: [phone] Office: [phone]

## 2021-09-10 NOTE — TOC Progression Note (Signed)
Transition of Care [facility]) - Progression Note    Patient Details  Name: Kim Ross MRN: [id number] Date of Birth: 11/16/1955  Transition of Care [facility]) CM/SW Contact  Mikinzie Maciejewski, Juliann Pulse, RN Phone Number: 09/10/2021, 3:00 PM  Clinical Narrative: Noted PMT-GOC ongoing discussions-poor prognosis-patient wants to discuss w/family-noted PEG/GT-will not provide any recovery or improvement. HHC agency-[facility] dme all following if patient decides to d/c with HHC.Mallie Mussel s/o voicemail full. Await further reccommendations,following for d/c needs.      Expected Discharge Plan:  (TBD) Barriers to Discharge: Continued Medical Work up  Expected Discharge Plan and Services Expected Discharge Plan:  (TBD)   Discharge Planning Services: CM Consult   Living arrangements for the past 2 months: Single Family Home                                       Social Determinants of Health (SDOH) Interventions    Readmission Risk Interventions No flowsheet data found.

## 2021-09-11 LAB — CBC WITH DIFFERENTIAL/PLATELET
Abs Immature Granulocytes: 0.3 10*3/uL — ABNORMAL HIGH (ref 0.00–0.07)
Basophils Absolute: 0.1 10*3/uL (ref 0.0–0.1)
Basophils Relative: 1 %
Eosinophils Absolute: 0 10*3/uL (ref 0.0–0.5)
Eosinophils Relative: 0 %
HCT: 33.3 % — ABNORMAL LOW (ref 36.0–46.0)
Hemoglobin: 10.3 g/dL — ABNORMAL LOW (ref 12.0–15.0)
Immature Granulocytes: 2 %
Lymphocytes Relative: 6 %
Lymphs Abs: 0.8 10*3/uL (ref 0.7–4.0)
MCH: 28.7 pg (ref 26.0–34.0)
MCHC: 30.9 g/dL (ref 30.0–36.0)
MCV: 92.8 fL (ref 80.0–100.0)
Monocytes Absolute: 1.9 10*3/uL — ABNORMAL HIGH (ref 0.1–1.0)
Monocytes Relative: 15 %
Neutro Abs: 9.3 10*3/uL — ABNORMAL HIGH (ref 1.7–7.7)
Neutrophils Relative %: 76 %
Platelets: 15 10*3/uL — CL (ref 150–400)
RBC: 3.59 MIL/uL — ABNORMAL LOW (ref 3.87–5.11)
RDW: 17.2 % — ABNORMAL HIGH (ref 11.5–15.5)
WBC: 12.3 10*3/uL — ABNORMAL HIGH (ref 4.0–10.5)
nRBC: 0 % (ref 0.0–0.2)

## 2021-09-11 LAB — CBC
HCT: 27.9 % — ABNORMAL LOW (ref 36.0–46.0)
Hemoglobin: 8.9 g/dL — ABNORMAL LOW (ref 12.0–15.0)
MCH: 28.9 pg (ref 26.0–34.0)
MCHC: 31.9 g/dL (ref 30.0–36.0)
MCV: 90.6 fL (ref 80.0–100.0)
Platelets: 112 10*3/uL — ABNORMAL LOW (ref 150–400)
RBC: 3.08 MIL/uL — ABNORMAL LOW (ref 3.87–5.11)
RDW: 17.1 % — ABNORMAL HIGH (ref 11.5–15.5)
WBC: 10.6 10*3/uL — ABNORMAL HIGH (ref 4.0–10.5)
nRBC: 0 % (ref 0.0–0.2)

## 2021-09-11 LAB — BASIC METABOLIC PANEL
Anion gap: 7 (ref 5–15)
BUN: 7 mg/dL — ABNORMAL LOW (ref 8–23)
CO2: 25 mmol/L (ref 22–32)
Calcium: 8 mg/dL — ABNORMAL LOW (ref 8.9–10.3)
Chloride: 100 mmol/L (ref 98–111)
Creatinine, Ser: 0.37 mg/dL — ABNORMAL LOW (ref 0.44–1.00)
GFR, Estimated: >60 mL/min (ref >60)
Glucose, Bld: 82 mg/dL (ref 70–99)
Potassium: 3.4 mmol/L — ABNORMAL LOW (ref 3.5–5.1)
Sodium: 132 mmol/L — ABNORMAL LOW (ref 135–145)

## 2021-09-11 LAB — GLUCOSE, CAPILLARY
Glucose-Capillary: 87 mg/dL (ref 70–99)
Glucose-Capillary: 93 mg/dL (ref 70–99)
Glucose-Capillary: 86 mg/dL (ref 70–99)
Glucose-Capillary: 85 mg/dL (ref 70–99)

## 2021-09-11 LAB — MAGNESIUM: Magnesium: 1.2 mg/dL — ABNORMAL LOW (ref 1.7–2.4)

## 2021-09-11 LAB — TECHNOLOGIST SMEAR REVIEW

## 2021-09-11 MED ORDER — MAGNESIUM SULFATE 4 GM/100ML IV SOLN
4.0000 g | Freq: Once | INTRAVENOUS | Status: AC
  Administered 2021-09-11: 4 g via INTRAVENOUS
  Filled 2021-09-11: qty 100

## 2021-09-11 MED ORDER — FLUCONAZOLE 100 MG PO TABS
100.0000 mg | ORAL_TABLET | Freq: Every day | ORAL | Status: DC
  Administered 2021-09-11 – 2021-09-13 (×3): 100 mg via ORAL
  Filled 2021-09-11 (×3): qty 1

## 2021-09-11 MED ORDER — MAGNESIUM SULFATE 4 GM/100ML IV SOLN
4.0000 g | Freq: Once | INTRAVENOUS | Status: DC
  Filled 2021-09-11: qty 100

## 2021-09-11 MED ORDER — SODIUM CHLORIDE 0.9% IV SOLUTION: Freq: Once | INTRAVENOUS | Status: AC

## 2021-09-11 MED ORDER — MAGNESIUM SULFATE 2 GM/50ML IV SOLN
2.0000 g | Freq: Once | INTRAVENOUS | Status: AC
  Administered 2021-09-11: 2 g via INTRAVENOUS
  Filled 2021-09-11: qty 50

## 2021-09-11 MED ORDER — POTASSIUM CHLORIDE CRYS ER 20 MEQ PO TBCR
30.0000 meq | EXTENDED_RELEASE_TABLET | ORAL | Status: AC
  Administered 2021-09-11 (×2): 30 meq via ORAL
  Filled 2021-09-11 (×2): qty 1

## 2021-09-11 NOTE — Progress Notes (Addendum)
   Palliative Medicine Inpatient Follow Up Note     Chart Reviewed. Patient assessed at the bedside.   Kim Ross is awake, alert, and oriented x3. States pain is controlled. Complains of ongoing weakness and no appetite.   We reviewed previous goals of care discussions. Kim Ross expresses she is hopeful she can have PEG tube placed and gain access to some nutrition as she has little to no interest in food and when she does eat she is immediately full or food doesn't taste good. She has a lunch tray at the bedside untouched.   I again discussed at length concerns for her ability to safely have PEG placed due to her continued low platelets which are 15 on today. She verbalized understanding expressing plans for a platelet transfusion later today. She is inquiring about a Cortrak being placed if she is unable to have PEG. Extensive education provided on additional risk of Cortrak. I approached topic of what patient's expectations are and what her hopes are. She shares she is hopeful for a PEG to allow her more time on earth but does not want to suffer. We discussed no meaningful improvement by initiating artificial nutrition as her underlying conditions will continue to be a challenge and cause concerns for continued decline. She expresses her understanding of poor long-term prognosis.   I empathetically approached discussions regarding comfort focused care and hospice. She is tearful expressing she knows things aren't good. Emotional support provided. She would like to speak with her family and gain an understanding of their feelings. I offered to assist in discussions and provide updates. She would like to talk with them herself and will let me know if she needs some support. She shares her boyfriend/husband stayed overnight but also expresses she would rather be alone. Kim Ross does not want to suffer but also would like to consider options if it allows her additional time with her family, more specifically  throughout the holidays.   All questions answered and support provided.   Plan -ongoing goals of care discussions. Patient is planning to have family discussions regarding her poor prognosis. She would like to speak with them alone but knows we are here to support her.  -Patient continues to remain hopeful for PEG but also acknowledges this may not happen due to her low platelet count. Extensive education provided on the use of artificial nutrition and it's appropriateness in terms of her overall health condition. Recommendations for hospice support and comfort focused care provided and discussed.  -PMT will continue to support and follow. Patient is aware I am off service Saturday returning Sunday.   Time Total: 45 min  Visit consisted of counseling and education dealing with the complex and emotionally intense issues of symptom management and palliative care in the setting of serious and potentially life-threatening illness.Greater than 50%  of this time was spent counseling and coordinating care related to the above assessment and plan.  Alda Lea, AGPCNP-BC  Palliative Medicine Team [phone]
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
## 2021-09-11 NOTE — Care Management Important Message (Signed)
Important Message  Patient Details IM Letter given to the Patient. Name: Kim Ross MRN: [id number] Date of Birth: June 28, 1956   Medicare Important Message Given:  Yes     Kerin Salen 09/11/2021, 1:31 PM

## 2021-09-11 NOTE — Progress Notes (Signed)
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 TIME:  Fluoroscopy Time:  2 minutes 18 seconds Radiation Exposure Index (if provided by the fluoroscopic device): 8.5 mGy Number of Acquired Spot Images: 0 COMPARISON:  None. FINDINGS: A very limited examination was performed in the recumbent LPO position, due to patient condition. Patient had difficulty swallowing a significant amount of contrast. Poor esophageal motility. Suspect a high-grade distal esophageal stricture, best seen on cine imaging. IMPRESSION: 1. Very limited exam performed due to patient condition. 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 2. Poor esophageal motility. Electronically Signed   By: Lorin Picket M.D.   On: 08/31/2021 09:19   US THORACENTESIS ASP PLEURAL SPACE W/IMG GUIDE  Result Date: 08/26/2021 INDICATION: Patient with history of stage IV  non-small cell lung cancer, left pleural effusion; request received for diagnostic and therapeutic left thoracentesis. EXAM: ULTRASOUND GUIDED DIAGNOSTIC AND THERAPEUTIC LEFT THORACENTESIS MEDICATIONS: 10 mL 1% lidocaine COMPLICATIONS: None immediate. PROCEDURE: An ultrasound guided thoracentesis was thoroughly discussed with the patient and questions answered. The benefits, risks, alternatives and complications were also discussed. The patient understands and wishes to proceed with the procedure. Written consent was obtained. Ultrasound was performed to localize and mark an adequate pocket of fluid in the left chest. The area was then prepped and draped in the normal sterile fashion. 1% Lidocaine was used for local anesthesia. Under ultrasound guidance a 6 Fr Safe-T-Centesis catheter was introduced. Thoracentesis was performed. The catheter was removed and a dressing applied. FINDINGS: A total of approximately 320 cc of yellow fluid was removed. Samples were sent to the laboratory as requested by the clinical team. IMPRESSION: Successful ultrasound guided diagnostic and therapeutic left thoracentesis yielding 320 cc of pleural fluid. Read by: Rowe Robert, PA-C Electronically Signed   By: Miachel Roux M.D.   On: 08/26/2021 16:19    ASSESSMENT AND PLAN: This is a 65 year old female with stage IV non-small cell lung cancer, adenosquamous carcinoma who was initially diagnosed with a large cavitary left lower lobe  lung mass, cavitary mass in the mediastinum, and left upper lobe nodule in addition to pleuritic mass with pleural and lytic bone lesions.  She was diagnosed in September 2022.  She is now receiving palliative systemic chemotherapy with carboplatin for an AUC of 5, Alimta 500 mg per metered squared, and Keytruda 200 mg IV every 3 weeks.  Status post 3 cycles.  Last cycle was given on 08/25/2021.    During her last chemotherapy, she developed A. fib and was sent to the emergency department.  She was found  to have a DVT in her left lower extremity.  She was started on Lovenox.  Due to concern for drop in platelets, Lovenox has been placed on hold.  HIT panel was sent and was negative.  She received Granix and white count is now elevated at 12.3, hemoglobin continues to slowly improve and is up to 10.3 this morning, but platelets continue to drop and are down to 15,000 today.  She has no active bleeding this morning.  However, given that platelet count is below 20,000, recommend platelet transfusion today.  Our transfusion parameters: Recommend PRBC transfusion for hemoglobin less than 7 or platelet count less than 20,000 or active bleeding.  Drop in platelets may still be related to recent chemotherapy.  However, toxic granulation noted on peripheral blood smear and could have underlying infection.  Currently has no obvious symptoms of infection but will continue to monitor for this.  Would have a low threshold to begin antibiotics on her.  She has oral candidiasis and will start her on fluconazole 100 mg daily for 10 days.  I again discussed feeding tube placement with the patient today.  She would like to pursue this.  She understands it is not safe to do so until her platelet count improves.  We will readdress this when it is safe to consider feeding tube placement.  Future Appointments  Date Time Provider [place]  09/16/2021 12:45 PM [name] LAB [name] None  09/16/2021  1:30 PM Curt Bears, MD [name] None  09/16/2021  2:15 PM [name] INFUSION [name] None  09/16/2021  2:30 PM Morrell Riddle, RD [name] None  09/21/2021 10:05 AM Loel Dubonnet, NP [facility]  09/23/2021 12:45 PM [name] LAB [name] None  09/30/2021 12:45 PM [name] LAB [name] None  10/07/2021  9:00 AM [name] LAB [name] None  10/07/2021  9:30 AM Heilingoetter, Cassandra L, PA-C [name] None  10/07/2021 10:15 AM [name] INFUSION [name] None   10/14/2021 12:30 PM [name] LAB [name] None  10/21/2021 12:00 PM [name] None  10/27/2021  9:30 AM [name] LAB [name] None  10/27/2021 10:00 AM Curt Bears, MD [name] None  10/27/2021 10:45 AM [name] INFUSION [name] None  11/17/2021 10:30 AM [name] LAB [name] None  11/17/2021 11:00 AM Curt Bears, MD [name] None  11/17/2021 11:45 AM [name] INFUSION [name] None  12/08/2021 11:00 AM [name] LAB [name] None  12/08/2021 11:30 AM Curt Bears, MD [name] None  12/08/2021 12:30 PM [name] INFUSION [name] None      LOS: 16 days   Mikey Bussing, DNP, AGPCNP-BC, AOCNP 09/11/21

## 2021-09-11 NOTE — Progress Notes (Signed)
Physical Therapy Treatment Patient Details Name: Kim Ross MRN: [id number] DOB: [birth year] Today's Date: 09/11/2021   History of Present Illness 65yo female who presented on 11/22 after having palpitations while at [facility] center; found to be in Afib with RVR and transferred to ED. PE negative but did have pleural effusion, addressed with thoracentesis on 11/23. 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 She sat at the edge of the bed for 3 minutes, tolerance limited by fatigue.     Recommendations for follow up therapy are one component of a multi-disciplinary discharge planning process, led by the attending physician.  Recommendations may be updated based on patient status, additional functional criteria and insurance authorization.  Follow Up Recommendations  Home health PT     Assistance Recommended at Discharge Intermittent Supervision/Assistance  Equipment Recommendations  Wheelchair (measurements PT);Wheelchair cushion (measurements PT)    Recommendations for Other Services       Precautions / Restrictions Precautions Precautions: Fall Precaution Comments: high fracture risk due CA, L LE pain, watch vitals, Restrictions Weight Bearing Restrictions: No     Mobility  Bed Mobility   Bed Mobility: Supine to Sit;Sit to Supine     Supine to sit: Supervision;HOB elevated Sit to supine: Min assist   General bed mobility comments: HOB up, used bedrail for supine to sit, min A for BLEs into bed with sit to supine. Pt sat EOB x 3 minutes. She declined getting OOB 2* pain.    Transfers                        Ambulation/Gait                   Stairs             Wheelchair Mobility    Modified Rankin (Stroke Patients Only)       Balance Overall balance assessment:  Needs assistance Sitting-balance support: Bilateral upper extremity supported;Feet unsupported Sitting balance-Leahy Scale: Fair Sitting balance - Comments: sits without support x 3 minutes                                    Cognition Arousal/Alertness: Awake/alert Behavior During Therapy: WFL for tasks assessed/performed Overall Cognitive Status: Within Functional Limits for tasks assessed                                          Exercises General Exercises - Lower Extremity Ankle Circles/Pumps: AROM;Both;Supine;15 reps Short Arc Quad: AROM;Both;10 reps;Supine Shoulder Exercises Shoulder Flexion: AROM;Both;10 reps;Seated    General Comments        Pertinent Vitals/Pain Pain Score: 8  Pain Location: back Pain Descriptors / Indicators: Aching;Grimacing Pain Intervention(s): Limited activity within patient's tolerance;Monitored during session;Premedicated before session;Repositioned    Home Living                          Prior Function            PT Goals (current goals can now be found in the care  plan section) Acute Rehab PT Goals Patient Stated Goal: go home PT Goal Formulation: With patient Time For Goal Achievement: 09/25/21 Potential to Achieve Goals: Good Progress towards PT goals: Progressing toward goals    Frequency    Min 3X/week      PT Plan Current plan remains appropriate    Co-evaluation              AM-PAC PT "6 Clicks" Mobility   Outcome Measure  Help needed turning from your back to your side while in a flat bed without using bedrails?: A Little Help needed moving from lying on your back to sitting on the side of a flat bed without using bedrails?: A Little Help needed moving to and from a bed to a chair (including a wheelchair)?: A Lot Help needed standing up from a chair using your arms (e.g., wheelchair or bedside chair)?: A Lot Help needed to walk in hospital room?: Total Help needed  climbing 3-5 steps with a railing? : Total 6 Click Score: 12    End of Session   Activity Tolerance: Patient limited by fatigue;Patient limited by pain Patient left: in bed;with call bell/phone within reach;with nursing/sitter in room Nurse Communication: Mobility status PT Visit Diagnosis: Unsteadiness on feet (R26.81);Muscle weakness (generalized) (M62.81);Difficulty in walking, not elsewhere classified (R26.2);Pain     Time: 1205-1218 PT Time Calculation (min) (ACUTE ONLY): 13 min  Charges:  $Therapeutic Exercise: 8-22 mins                     Blondell Reveal Kistler PT 09/11/2021  Acute Rehabilitation Services Pager [phone] Office [phone]

## 2021-09-12 LAB — CBC WITH DIFFERENTIAL/PLATELET
Abs Immature Granulocytes: 0.23 10*3/uL — ABNORMAL HIGH (ref 0.00–0.07)
Basophils Absolute: 0 10*3/uL (ref 0.0–0.1)
Basophils Relative: 0 %
Eosinophils Absolute: 0 10*3/uL (ref 0.0–0.5)
Eosinophils Relative: 0 %
HCT: 29.9 % — ABNORMAL LOW (ref 36.0–46.0)
Hemoglobin: 9.5 g/dL — ABNORMAL LOW (ref 12.0–15.0)
Immature Granulocytes: 2 %
Lymphocytes Relative: 8 %
Lymphs Abs: 0.7 10*3/uL (ref 0.7–4.0)
MCH: 28.9 pg (ref 26.0–34.0)
MCHC: 31.8 g/dL (ref 30.0–36.0)
MCV: 90.9 fL (ref 80.0–100.0)
Monocytes Absolute: 2 10*3/uL — ABNORMAL HIGH (ref 0.1–1.0)
Monocytes Relative: 21 %
Neutro Abs: 6.7 10*3/uL (ref 1.7–7.7)
Neutrophils Relative %: 69 %
Platelets: 103 10*3/uL — ABNORMAL LOW (ref 150–400)
RBC: 3.29 MIL/uL — ABNORMAL LOW (ref 3.87–5.11)
RDW: 17.2 % — ABNORMAL HIGH (ref 11.5–15.5)
WBC: 9.7 10*3/uL (ref 4.0–10.5)
nRBC: 0 % (ref 0.0–0.2)

## 2021-09-12 LAB — BASIC METABOLIC PANEL
Anion gap: 7 (ref 5–15)
BUN: 8 mg/dL (ref 8–23)
CO2: 28 mmol/L (ref 22–32)
Calcium: 8 mg/dL — ABNORMAL LOW (ref 8.9–10.3)
Chloride: 100 mmol/L (ref 98–111)
Creatinine, Ser: 0.33 mg/dL — ABNORMAL LOW (ref 0.44–1.00)
GFR, Estimated: >60 mL/min (ref >60)
Glucose, Bld: 70 mg/dL (ref 70–99)
Potassium: 3.8 mmol/L (ref 3.5–5.1)
Sodium: 135 mmol/L (ref 135–145)

## 2021-09-12 LAB — GLUCOSE, CAPILLARY
Glucose-Capillary: 79 mg/dL (ref 70–99)
Glucose-Capillary: 87 mg/dL (ref 70–99)
Glucose-Capillary: 70 mg/dL (ref 70–99)
Glucose-Capillary: 79 mg/dL (ref 70–99)

## 2021-09-12 LAB — MAGNESIUM: Magnesium: 2.1 mg/dL (ref 1.7–2.4)
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
## 2021-09-13 LAB — GLUCOSE, CAPILLARY
Glucose-Capillary: 74 mg/dL (ref 70–99)
Glucose-Capillary: 92 mg/dL (ref 70–99)
Glucose-Capillary: 83 mg/dL (ref 70–99)
Glucose-Capillary: 100 mg/dL — ABNORMAL HIGH (ref 70–99)

## 2021-09-13 LAB — CBC WITH DIFFERENTIAL/PLATELET
Abs Immature Granulocytes: 0.41 10*3/uL — ABNORMAL HIGH (ref 0.00–0.07)
Basophils Absolute: 0 10*3/uL (ref 0.0–0.1)
Basophils Relative: 0 %
Eosinophils Absolute: 0 10*3/uL (ref 0.0–0.5)
Eosinophils Relative: 0 %
HCT: 32.5 % — ABNORMAL LOW (ref 36.0–46.0)
Hemoglobin: 10.1 g/dL — ABNORMAL LOW (ref 12.0–15.0)
Immature Granulocytes: 4 %
Lymphocytes Relative: 6 %
Lymphs Abs: 0.8 10*3/uL (ref 0.7–4.0)
MCH: 28.7 pg (ref 26.0–34.0)
MCHC: 31.1 g/dL (ref 30.0–36.0)
MCV: 92.3 fL (ref 80.0–100.0)
Monocytes Absolute: 2 10*3/uL — ABNORMAL HIGH (ref 0.1–1.0)
Monocytes Relative: 18 %
Neutro Abs: 8.4 10*3/uL — ABNORMAL HIGH (ref 1.7–7.7)
Neutrophils Relative %: 72 %
Platelets: 93 10*3/uL — ABNORMAL LOW (ref 150–400)
RBC: 3.52 MIL/uL — ABNORMAL LOW (ref 3.87–5.11)
RDW: 17.4 % — ABNORMAL HIGH (ref 11.5–15.5)
WBC: 11.7 10*3/uL — ABNORMAL HIGH (ref 4.0–10.5)
nRBC: 0 % (ref 0.0–0.2)

## 2021-09-13 LAB — BASIC METABOLIC PANEL
Anion gap: 8 (ref 5–15)
BUN: 8 mg/dL (ref 8–23)
CO2: 27 mmol/L (ref 22–32)
Calcium: 8.3 mg/dL — ABNORMAL LOW (ref 8.9–10.3)
Chloride: 101 mmol/L (ref 98–111)
Creatinine, Ser: 0.39 mg/dL — ABNORMAL LOW (ref 0.44–1.00)
GFR, Estimated: >60 mL/min (ref >60)
Glucose, Bld: 81 mg/dL (ref 70–99)
Potassium: 3.6 mmol/L (ref 3.5–5.1)
Sodium: 136 mmol/L (ref 135–145)

## 2021-09-13 LAB — MAGNESIUM: Magnesium: 1.5 mg/dL — ABNORMAL LOW (ref 1.7–2.4)

## 2021-09-13 MED ORDER — MAGNESIUM SULFATE 4 GM/100ML IV SOLN
4.0000 g | Freq: Once | INTRAVENOUS | Status: AC
  Administered 2021-09-13: 4 g via INTRAVENOUS
  Filled 2021-09-13: qty 100

## 2021-09-13 MED ORDER — SODIUM CHLORIDE 0.9 % IV SOLN: INTRAVENOUS | Status: DC
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
## 2021-09-13 NOTE — Progress Notes (Signed)
   Palliative Medicine Inpatient Follow Up Note     Chart Reviewed. Patient assessed at the bedside.   Patient is awake sitting up in bed. Denies shortness of breath. Complains of intermittent pain however controlled with medications when needed. No acute distress noted. Weak appearing.   Kim Ross is remaining hopeful for PEG placement. She shares she is trying her best to eat however just does not have an appetite and with concerns for dysphagia/odynophagia. She understands risk and benefits and expresses "if things doesn't work out" she would at least be at peace knowing she gave it a shot.    She continues to decline however confirms wishes to continue to allow herself every opportunity to live with limitations of DNR/DNI. Kim Ross knows long-term prognosis is poor. She hopes to be around for her family a little longer.   All questions and support provided.   Time Total: 25 min.   Visit consisted of counseling and education dealing with the complex and emotionally intense issues of symptom management and palliative care in the setting of serious and potentially life-threatening illness.Greater than 50%  of this time was spent counseling and coordinating care related to the above assessment and plan.  Kim Ross, AGPCNP-BC  Palliative Medicine Team [phone]

## 2021-09-14 ENCOUNTER — Inpatient Hospital Stay (HOSPITAL_COMMUNITY): Payer: Medicare Other

## 2021-09-14 HISTORY — PX: IR GASTROSTOMY TUBE MOD SED: IMG625

## 2021-09-14 LAB — CBC WITH DIFFERENTIAL/PLATELET
Abs Immature Granulocytes: 0.19 10*3/uL — ABNORMAL HIGH (ref 0.00–0.07)
Abs Immature Granulocytes: 0.81 10*3/uL — ABNORMAL HIGH (ref 0.00–0.07)
Basophils Absolute: 0.1 10*3/uL (ref 0.0–0.1)
Basophils Absolute: 0.1 10*3/uL (ref 0.0–0.1)
Basophils Relative: 1 %
Basophils Relative: 1 %
Eosinophils Absolute: 0 10*3/uL (ref 0.0–0.5)
Eosinophils Absolute: 0 10*3/uL (ref 0.0–0.5)
Eosinophils Relative: 0 %
Eosinophils Relative: 0 %
HCT: 31.3 % — ABNORMAL LOW (ref 36.0–46.0)
HCT: 30.7 % — ABNORMAL LOW (ref 36.0–46.0)
Hemoglobin: 10 g/dL — ABNORMAL LOW (ref 12.0–15.0)
Hemoglobin: 9.2 g/dL — ABNORMAL LOW (ref 12.0–15.0)
Immature Granulocytes: 2 %
Immature Granulocytes: 7 %
Lymphocytes Relative: 6 %
Lymphocytes Relative: 6 %
Lymphs Abs: 0.7 10*3/uL (ref 0.7–4.0)
Lymphs Abs: 0.8 10*3/uL (ref 0.7–4.0)
MCH: 29.2 pg (ref 26.0–34.0)
MCH: 28.6 pg (ref 26.0–34.0)
MCHC: 31.9 g/dL (ref 30.0–36.0)
MCHC: 30 g/dL (ref 30.0–36.0)
MCV: 91.3 fL (ref 80.0–100.0)
MCV: 95.3 fL (ref 80.0–100.0)
Monocytes Absolute: 1.5 10*3/uL — ABNORMAL HIGH (ref 0.1–1.0)
Monocytes Absolute: 2.5 10*3/uL — ABNORMAL HIGH (ref 0.1–1.0)
Monocytes Relative: 15 %
Monocytes Relative: 20 %
Neutro Abs: 8.1 10*3/uL — ABNORMAL HIGH (ref 1.7–7.7)
Neutro Abs: 8.3 10*3/uL — ABNORMAL HIGH (ref 1.7–7.7)
Neutrophils Relative %: 76 %
Neutrophils Relative %: 66 %
Platelets: 22 10*3/uL — CL (ref 150–400)
Platelets: 84 10*3/uL — ABNORMAL LOW (ref 150–400)
RBC: 3.43 MIL/uL — ABNORMAL LOW (ref 3.87–5.11)
RBC: 3.22 MIL/uL — ABNORMAL LOW (ref 3.87–5.11)
RDW: 17 % — ABNORMAL HIGH (ref 11.5–15.5)
RDW: 17.4 % — ABNORMAL HIGH (ref 11.5–15.5)
WBC: 10.6 10*3/uL — ABNORMAL HIGH (ref 4.0–10.5)
WBC: 12.5 10*3/uL — ABNORMAL HIGH (ref 4.0–10.5)
nRBC: 0 % (ref 0.0–0.2)
nRBC: 0 % (ref 0.0–0.2)

## 2021-09-14 LAB — BPAM PLATELET PHERESIS
Blood Product Expiration Date: 202212112359
Blood Product Expiration Date: 202212112359
ISSUE DATE / TIME: 202212091214
ISSUE DATE / TIME: 202212091524
Unit Type and Rh: 5100
Unit Type and Rh: 600

## 2021-09-14 LAB — PREPARE PLATELET PHERESIS
Unit division: 0
Unit division: 0

## 2021-09-14 LAB — GLUCOSE, CAPILLARY
Glucose-Capillary: 73 mg/dL (ref 70–99)
Glucose-Capillary: 73 mg/dL (ref 70–99)
Glucose-Capillary: 75 mg/dL (ref 70–99)
Glucose-Capillary: 70 mg/dL (ref 70–99)
Glucose-Capillary: 95 mg/dL (ref 70–99)

## 2021-09-14 LAB — PROTIME-INR
INR: 1.2 (ref 0.8–1.2)
Prothrombin Time: 15.3 seconds — ABNORMAL HIGH (ref 11.4–15.2)

## 2021-09-14 LAB — MAGNESIUM: Magnesium: 1.7 mg/dL (ref 1.7–2.4)

## 2021-09-14 LAB — MISC LABCORP TEST (SEND OUT): Labcorp test code: 9985

## 2021-09-14 LAB — BASIC METABOLIC PANEL
Anion gap: 7 (ref 5–15)
BUN: 8 mg/dL (ref 8–23)
CO2: 27 mmol/L (ref 22–32)
Calcium: 7.8 mg/dL — ABNORMAL LOW (ref 8.9–10.3)
Chloride: 102 mmol/L (ref 98–111)
Creatinine, Ser: 0.31 mg/dL — ABNORMAL LOW (ref 0.44–1.00)
GFR, Estimated: >60 mL/min (ref >60)
Glucose, Bld: 73 mg/dL (ref 70–99)
Potassium: 3.4 mmol/L — ABNORMAL LOW (ref 3.5–5.1)
Sodium: 136 mmol/L (ref 135–145)

## 2021-09-14 MED ORDER — FREE WATER
100.0000 mL | Status: DC
  Administered 2021-09-15 – 2021-09-18 (×20): 100 mL

## 2021-09-14 MED ORDER — HYDROCODONE-ACETAMINOPHEN 5-325 MG PO TABS
1.0000 | ORAL_TABLET | Freq: Four times a day (QID) | ORAL | Status: DC | PRN
  Administered 2021-09-14 – 2021-09-15 (×2): 1
  Filled 2021-09-14: qty 1

## 2021-09-14 MED ORDER — OSMOLITE 1.2 CAL PO LIQD
1000.0000 mL | ORAL | Status: DC
  Filled 2021-09-14: qty 1000

## 2021-09-14 MED ORDER — FENTANYL CITRATE (PF) 100 MCG/2ML IJ SOLN
INTRAMUSCULAR | Status: AC | PRN
  Administered 2021-09-14: 25 ug via INTRAVENOUS

## 2021-09-14 MED ORDER — MIDAZOLAM HCL 2 MG/2ML IJ SOLN
INTRAMUSCULAR | Status: AC | PRN
  Administered 2021-09-14: .5 mg via INTRAVENOUS

## 2021-09-14 MED ORDER — OSMOLITE 1.5 CAL PO LIQD
1000.0000 mL | ORAL | Status: DC
  Administered 2021-09-15 – 2021-09-18 (×2): 1000 mL
  Filled 2021-09-14 (×5): qty 1000

## 2021-09-14 MED ORDER — MECLIZINE HCL 25 MG PO TABS: 25.0000 mg | ORAL_TABLET | Freq: Two times a day (BID) | ORAL | Status: DC | PRN

## 2021-09-14 MED ORDER — MAGNESIUM OXIDE -MG SUPPLEMENT 400 (240 MG) MG PO TABS
800.0000 mg | ORAL_TABLET | Freq: Every day | ORAL | Status: DC
  Administered 2021-09-15 – 2021-09-28 (×14): 800 mg
  Filled 2021-09-14 (×14): qty 2

## 2021-09-14 MED ORDER — GLUCAGON HCL (RDNA) 1 MG IJ SOLR
INTRAMUSCULAR | Status: AC | PRN
  Administered 2021-09-14: .5 mg via INTRAVENOUS

## 2021-09-14 MED ORDER — MELATONIN 3 MG PO TABS
3.0000 mg | ORAL_TABLET | Freq: Every day | ORAL | Status: DC
  Administered 2021-09-14 – 2021-09-27 (×14): 3 mg
  Filled 2021-09-14 (×14): qty 1

## 2021-09-14 MED ORDER — IOHEXOL 300 MG/ML  SOLN
50.0000 mL | Freq: Once | INTRAMUSCULAR | Status: AC | PRN
  Administered 2021-09-14: 25 mL

## 2021-09-14 MED ORDER — CEFAZOLIN SODIUM-DEXTROSE 2-4 GM/100ML-% IV SOLN
2.0000 g | INTRAVENOUS | Status: AC
  Filled 2021-09-14: qty 100

## 2021-09-14 MED ORDER — ATORVASTATIN CALCIUM 40 MG PO TABS
40.0000 mg | ORAL_TABLET | Freq: Every day | ORAL | Status: DC
  Administered 2021-09-15 – 2021-09-28 (×14): 40 mg
  Filled 2021-09-14 (×14): qty 1

## 2021-09-14 MED ORDER — FENTANYL CITRATE (PF) 100 MCG/2ML IJ SOLN
INTRAMUSCULAR | Status: AC
  Filled 2021-09-14: qty 2

## 2021-09-14 MED ORDER — GUAIFENESIN-DM 100-10 MG/5ML PO SYRP
5.0000 mL | ORAL_SOLUTION | ORAL | Status: DC | PRN
  Administered 2021-09-15 – 2021-09-23 (×3): 5 mL
  Filled 2021-09-14 (×3): qty 10

## 2021-09-14 MED ORDER — MIDODRINE HCL 5 MG PO TABS
2.5000 mg | ORAL_TABLET | Freq: Three times a day (TID) | ORAL | Status: DC
  Administered 2021-09-15 – 2021-09-28 (×41): 2.5 mg
  Filled 2021-09-14 (×38): qty 1

## 2021-09-14 MED ORDER — LIDOCAINE HCL 1 % IJ SOLN
INTRAMUSCULAR | Status: AC
  Filled 2021-09-14: qty 20

## 2021-09-14 MED ORDER — MIDAZOLAM HCL 2 MG/2ML IJ SOLN
INTRAMUSCULAR | Status: AC
  Filled 2021-09-14: qty 2

## 2021-09-14 MED ORDER — AMIODARONE HCL 200 MG PO TABS
200.0000 mg | ORAL_TABLET | Freq: Two times a day (BID) | ORAL | Status: AC
  Administered 2021-09-14 – 2021-09-15 (×3): 200 mg
  Filled 2021-09-14 (×3): qty 1

## 2021-09-14 MED ORDER — MAGNESIUM SULFATE 2 GM/50ML IV SOLN
2.0000 g | Freq: Once | INTRAVENOUS | Status: AC
  Administered 2021-09-14: 2 g via INTRAVENOUS
  Filled 2021-09-14: qty 50

## 2021-09-14 MED ORDER — FLUCONAZOLE 100 MG PO TABS
100.0000 mg | ORAL_TABLET | Freq: Every day | ORAL | Status: AC
  Administered 2021-09-15 – 2021-09-20 (×6): 100 mg
  Filled 2021-09-14 (×6): qty 1

## 2021-09-14 MED ORDER — ADULT MULTIVITAMIN LIQUID CH
15.0000 mL | ORAL | Status: DC
  Administered 2021-09-15 – 2021-09-27 (×6): 15 mL
  Filled 2021-09-14 (×7): qty 15

## 2021-09-14 MED ORDER — GLUCAGON HCL RDNA (DIAGNOSTIC) 1 MG IJ SOLR
INTRAMUSCULAR | Status: AC
  Filled 2021-09-14: qty 1

## 2021-09-14 MED ORDER — PANTOPRAZOLE 2 MG/ML SUSPENSION
40.0000 mg | Freq: Two times a day (BID) | ORAL | Status: DC
  Administered 2021-09-14 – 2021-09-28 (×26): 40 mg
  Filled 2021-09-14 (×33): qty 20

## 2021-09-14 MED ORDER — LIDOCAINE VISCOUS HCL 2 % MT SOLN
15.0000 mL | OROMUCOSAL | Status: AC
  Administered 2021-09-14: 15 mL via OROMUCOSAL
  Filled 2021-09-14: qty 15

## 2021-09-14 MED ORDER — ENSURE ENLIVE PO LIQD
237.0000 mL | Freq: Three times a day (TID) | ORAL | Status: DC
  Administered 2021-09-14 – 2021-09-25 (×22): 237 mL

## 2021-09-14 MED ORDER — SERTRALINE HCL 50 MG PO TABS
50.0000 mg | ORAL_TABLET | Freq: Every day | ORAL | Status: DC
  Administered 2021-09-15 – 2021-09-28 (×14): 50 mg
  Filled 2021-09-14 (×14): qty 1

## 2021-09-14 MED ORDER — CEFAZOLIN SODIUM-DEXTROSE 2-4 GM/100ML-% IV SOLN
INTRAVENOUS | Status: AC
  Administered 2021-09-14: 2 g via INTRAVENOUS
  Filled 2021-09-14: qty 100

## 2021-09-14 MED ORDER — LIDOCAINE HCL (PF) 1 % IJ SOLN
INTRAMUSCULAR | Status: AC | PRN
  Administered 2021-09-14: 10 mL via INTRADERMAL

## 2021-09-14 MED ORDER — FOLIC ACID 1 MG PO TABS
1.0000 mg | ORAL_TABLET | Freq: Every day | ORAL | Status: DC
  Administered 2021-09-15 – 2021-09-28 (×14): 1 mg
  Filled 2021-09-14 (×14): qty 1

## 2021-09-14 NOTE — Procedures (Signed)
Interventional Radiology Procedure:   Indications: Dysphagia and lung cancer  Procedure: Gastrostomy tube placement   Findings: 14 Fr balloon retention tube placed.  2 T-fasteners placed for gastropexy  Complications: No immediate complications noted.     EBL: Minimal  Plan: Plan to start tube feeds tomorrow.  Will need remove / cut T-fasteners in 7-14 days.   Hebe Merriwether R. Anselm Pancoast, MD  Pager: [phone]

## 2021-09-14 NOTE — Plan of Care (Signed)
  Problem: Nutrition: Goal: Adequate nutrition will be maintained Outcome: Not Progressing   
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
## 2021-09-14 NOTE — Care Management Important Message (Signed)
Important Message  Patient Details IM Letter given to the Patient. Name: Kim Ross MRN: [id number] Date of Birth: 28-Feb-1956   Medicare Important Message Given:  Yes     Kerin Salen 09/14/2021, 1:56 PM

## 2021-09-14 NOTE — Progress Notes (Signed)
PT Cancellation Note  Patient Details Name: Kim Ross MRN: [id number] DOB: 08/08/1956   Cancelled Treatment:    Reason Eval/Treat Not Completed: Patient at procedure or test/unavailable    Doreatha Massed, PT Acute Rehabilitation  Office: [phone] Pager: [phone]

## 2021-09-14 NOTE — Progress Notes (Signed)
Report received from K. Gurley,RN. Pt presently in Interventional Radiology. Will continue plan of care. Natalie Leclaire, Laurel Dimmer, RN

## 2021-09-14 NOTE — Progress Notes (Signed)
Nutrition Follow-up  DOCUMENTATION CODES:   Severe malnutrition in context of chronic illness  INTERVENTION:  - will order TF regimen to start 12/13 evening: Osmolite 1.5 @ 20 ml/hr to advance by 10 ml every 24 hours to reach goal rate of 60 ml/hr with 100 ml free water every 4 hours. - at goal rate, this regimen will provide 2160 kcal, 90 grams protein, and 1697 ml free water. - weigh patient today. - continue Ensure Enlive TID once diet re-advanced.   Monitor magnesium, potassium, and phosphorus BID for at least 3 days, MD to replete as needed, as pt is at risk for refeeding syndrome given severe malnutrition, inadequate/poor PO intake for >3 weeks.   NUTRITION DIAGNOSIS:   Severe Malnutrition related to chronic illness, cancer and cancer related treatments as evidenced by severe fat depletion, severe muscle depletion. -ongoing  GOAL:   Patient will meet greater than or equal to 90% of their needs -unmet  MONITOR:   Diet advancement, TF tolerance, Labs, Weight trends   ASSESSMENT:   65 yo female with a PMH of stage IV non-small cell lung cancer, diabetes mellitus type 2, hyperlipidemia who has been recently diagnosed with lung cancer receiving chemotherapy was found to be having palpitations and found to be in A. fib with RVR at the chemotherapy infusion center and was referred to the ER.  Patient states over the last 2 months patient has been having intermittent episodes of palpitations with some chest pressure.  Also has been chronically short of breath. Admitted with A-fib with RVR.  Limited meal intakes documented since RD assessment on 12/5: 0% of breakfast and lunch on 12/5; 100% of lunch on 12/6; 0% of all meals on 12/11. She has been NPO since midnight in anticipation of PEG placement by IR.  Able to communicate with MD via secure chat.  She has not been weighed since 11/29. Mild pitting edema to BLE documented in the edema section of flow sheet on 12/11. She is noted to  be -1.45 L since 11/28.   Palliative Care is following patient and last saw her on 12/9. Note from that encounter read by RD.      Labs reviewed; CBG: 73 mg/dl, K: 3.4 mmol/l, creatinine: 0.31 mg/dl, Ca: 7.8 mg/dl.   Medications reviewed; 1 mg folvite/day, sliding scale novolog, 800 mg mag-ox/day, 2 g IV Mg sulfate x1 run 12/11 and x1 run 12/12, 3 mg melatonin/day, 1 tablet multivitamin with minerals/day, 40 mg protonix BID.  IVF; NS @ 75 ml/hr.    Diet Order:   Diet Order             Diet NPO time specified  Diet effective midnight                   EDUCATION NEEDS:   Education needs have been addressed  Skin:  Skin Assessment: Skin Integrity Issues: Skin Integrity Issues:: Stage II Stage II: Coccyx  Last BM:  11/26 (type 4)  Height:   Ht Readings from Last 1 Encounters:  09/01/21 5\' 5"  (1.651 m)    Weight:   Wt Readings from Last 1 Encounters:  09/01/21 50.8 kg     Estimated Nutritional Needs:  Kcal:  2100-2300 Protein:  80-95 grams Fluid:  >2.1 L     Jarome Matin, MS, RD, LDN, CNSC Inpatient Clinical Dietitian RD pager # available in [place]  After hours/weekend pager # available in [facility]

## 2021-09-14 NOTE — Consult Note (Signed)
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 08/27/2021 at 12:53:01 AM.    Final    ECHOCARDIOGRAM LIMITED  Result Date: 08/26/2021    ECHOCARDIOGRAM LIMITED REPORT   Patient Name:   Kim Ross Date of Exam: 08/26/2021 Medical Rec #:  [id number]      Height:       65.0 in Accession #:    [id number]     Weight:       130.0 lb Date of Birth:  [birth year]       BSA:          1.647 m Patient Age:    89 years       BP:           93/69 mmHg Patient Gender: F              HR:           84 bpm. Exam Location:  Inpatient Procedure: Limited Echo, Limited Color Doppler and Cardiac Doppler Indications:    I48.91* Unspeicified atrial fibrillation  History:        Patient has prior history of Echocardiogram examinations, most                 recent 05/05/2021.  Sonographer:    Bernadene Person RDCS Referring Phys: [place]  1. Left ventricular ejection fraction, by estimation, is 55 to 60%. The left  ventricle has normal function. The left ventricle has no regional wall motion abnormalities.  2. Right ventricular systolic function is normal. The right ventricular size is normal. There is normal pulmonary artery systolic pressure. The estimated right ventricular systolic pressure is 00.7 mmHg.  3. The mitral valve is normal in structure. Trivial mitral valve regurgitation. No evidence of mitral stenosis.  4. The aortic valve was not well visualized. Aortic valve regurgitation is not visualized. No aortic stenosis is present.  5. The inferior vena cava is normal in size with greater than 50% respiratory variability, suggesting right atrial pressure of 3 mmHg. FINDINGS  Left Ventricle: Left ventricular ejection fraction, by estimation, is 55 to 60%. The left ventricle has normal function. The left ventricle has no regional wall motion abnormalities. The left ventricular internal cavity size was normal in size. There is  no left ventricular hypertrophy. Right Ventricle: The right ventricular size is normal. No increase in right ventricular wall thickness. Right ventricular systolic function is normal. There is normal pulmonary artery systolic pressure. The tricuspid regurgitant velocity is 2.78 m/s, and  with an assumed right atrial pressure of 3 mmHg, the estimated right ventricular systolic pressure is 62.2 mmHg. Pericardium: There is no evidence of pericardial effusion. Mitral Valve: The mitral valve is normal in structure. Trivial mitral valve regurgitation. No evidence of mitral valve stenosis. Tricuspid Valve: The tricuspid valve is normal in structure. Tricuspid valve regurgitation is trivial. Aortic Valve:  The aortic valve was not well visualized. Aortic valve regurgitation is not visualized. No aortic stenosis is present. Pulmonic Valve: The pulmonic valve was not well visualized. Pulmonic valve regurgitation is not visualized. Aorta: The aortic root is normal in size and structure. Venous: The inferior  vena cava is normal in size with greater than 50% respiratory variability, suggesting right atrial pressure of 3 mmHg. LEFT VENTRICLE PLAX 2D LVIDd:         3.50 cm   Diastology LVIDs:         2.60 cm   LV e' medial:    8.59 cm/s LV PW:         0.80 cm   LV E/e' medial:  10.1 LV IVS:        0.80 cm   LV e' lateral:   6.31 cm/s LVOT diam:     1.70 cm   LV E/e' lateral: 13.8 LV SV:         32 LV SV Index:   19 LVOT Area:     2.27 cm  RIGHT VENTRICLE RV S prime:     8.67 cm/s TAPSE (M-mode): 1.4 cm LEFT ATRIUM         Index LA diam:    2.50 cm 1.52 cm/m  AORTIC VALVE LVOT Vmax:   80.60 cm/s LVOT Vmean:  49.700 cm/s LVOT VTI:    0.141 m  AORTA Ao Root diam: 2.80 cm MITRAL VALVE               TRICUSPID VALVE MV Area (PHT): 5.02 cm    TR Peak grad:   30.9 mmHg MV Decel Time: 151 msec    TR Vmax:        278.00 cm/s MV E velocity: 87.00 cm/s MV A velocity: 56.60 cm/s  SHUNTS MV E/A ratio:  1.54        Systemic VTI:  0.14 m                            Systemic Diam: 1.70 cm Oswaldo Milian MD Electronically signed by Oswaldo Milian MD Signature Date/Time: 08/26/2021/4:29:54 PM    Final    DG ESOPHAGUS W SINGLE CM (SOL OR THIN BA)  Result Date: 08/31/2021 CLINICAL DATA:  Trouble in pain with swallowing for 3-4 months. EXAM: ESOPHAGUS/BARIUM SWALLOW/TABLET STUDY TECHNIQUE: Initial scout AP supine abdominal image obtained to insure adequate colon cleansing. Barium was introduced into the colon in a retrograde fashion and refluxed from the rectum to the cecum. Spot images of the colon followed by overhead radiographs were obtained. FLUOROSCOPY TIME:  Fluoroscopy Time:  2 minutes 18 seconds Radiation Exposure Index (if provided by the fluoroscopic device): 8.5 mGy Number of Acquired Spot Images: 0 COMPARISON:  None. FINDINGS: A very limited examination was performed in the recumbent LPO position, due to patient condition. Patient had difficulty swallowing a significant amount of contrast. Poor esophageal  motility. Suspect a high-grade distal esophageal stricture, best seen on cine imaging. IMPRESSION: 1. Very limited exam performed due to patient condition. 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 2. Poor esophageal motility. Electronically Signed   By: Lorin Picket M.D.   On: 08/31/2021 09:19   US THORACENTESIS ASP PLEURAL SPACE W/IMG GUIDE  Result Date: 08/26/2021 INDICATION: Patient with history of stage IV non-small cell lung cancer, left pleural effusion; request received for diagnostic and therapeutic left thoracentesis. EXAM: ULTRASOUND GUIDED DIAGNOSTIC AND  THERAPEUTIC LEFT THORACENTESIS MEDICATIONS: 10 mL 1% lidocaine COMPLICATIONS: None immediate. PROCEDURE: An ultrasound guided thoracentesis was thoroughly discussed with the patient and questions answered. The benefits, risks, alternatives and complications were also discussed. The patient understands and wishes to proceed with the procedure. Written consent was obtained. Ultrasound was performed to localize and mark an adequate pocket of fluid in the left chest. The area was then prepped and draped in the normal sterile fashion. 1% Lidocaine was used for local anesthesia. Under ultrasound guidance a 6 Fr Safe-T-Centesis catheter was introduced. Thoracentesis was performed. The catheter was removed and a dressing applied. FINDINGS: A total of approximately 320 cc of yellow fluid was removed. Samples were sent to the laboratory as requested by the clinical team. IMPRESSION: Successful ultrasound guided diagnostic and therapeutic left thoracentesis yielding 320 cc of pleural fluid. Read by: Rowe Robert, PA-C Electronically Signed   By: Miachel Roux M.D.   On: 08/26/2021 16:19    Labs:  CBC: Recent Labs    09/11/21 1835 09/12/21 0515 09/13/21 0506 09/14/21 0439  WBC 10.6* 9.7 11.7* 12.5*  HGB 8.9* 9.5* 10.1* 9.2*  HCT 27.9* 29.9* 32.5* 30.7*  PLT 112* 103* 93* 84*    COAGS: Recent Labs     08/25/21 2102  INR 1.3*  APTT 189*    BMP: Recent Labs    11/28/20 1526 01/07/21 1148 09/11/21 0558 09/12/21 0515 09/13/21 0506 09/14/21 0439  NA 143   < > 132* 135 136 136  K 4.5   < > 3.4* 3.8 3.6 3.4*  CL 105   < > 100 100 101 102  CO2 19*   < > 25 28 27 27   GLUCOSE 116*   < > 82 70 81 73  BUN 9   < > 7* 8 8 8   CALCIUM 9.4   < > 8.0* 8.0* 8.3* 7.8*  CREATININE 0.93   < > 0.37* 0.33* 0.39* 0.31*  GFRNONAA 66   < > >60 >60 >60 >60  GFRAA 76  --   --   --   --   --    < > = values in this interval not displayed.    LIVER FUNCTION TESTS: Recent Labs    08/25/21 1149 08/25/21 1620 08/26/21 0017 08/26/21 2015 08/29/21 0257  BILITOT 0.4 0.8 0.6  --  0.3  AST 17 17 16   --  22  ALT 10 11 11   --  11  ALKPHOS 301* 262* 237*  --  166*  PROT 6.7 6.8 6.1*  --  5.0*  ALBUMIN 1.8* 2.1* 2.0* 2.1* 1.6*    TUMOR MARKERS: No results for input(s): AFPTM, CEA, CA199, CHROMGRNA in the last 8760 hours.  Assessment and Plan:  Stage IV lung cancer; odynophagia/dysphagia/oral candidiasis with poor oral intake and severe protein calorie malnutrition: Kim Ross, 65 year old female, is tentatively scheduled today for an image-guided gastrostomy tube placement.  Risks and benefits image guided gastrostomy tube placement was discussed with the patient including, but not limited to the need for a barium enema during the procedure, bleeding, infection, peritonitis and/or damage to adjacent structures.  All of the patient's questions were answered, patient is agreeable to proceed. She has been NPO. Labs and vitals have been reviewed.   Consent signed and in chart.   Thank you for this interesting consult.  I greatly enjoyed meeting Kim Ross and look forward to participating in their care.  A copy of this report was sent to the requesting provider  on this date.  Electronically Signed: Soyla Dryer, AGACNP-BC [phone] 09/14/2021, 9:14 AM   I spent a total of 20  Minutes    in face to face in clinical consultation, greater than 50% of which was counseling/coordinating care for gastrostomy tube placement.

## 2021-09-15 ENCOUNTER — Other Ambulatory Visit: Payer: Self-pay | Admitting: Radiology

## 2021-09-15 DIAGNOSIS — E43 Unspecified severe protein-calorie malnutrition: Secondary | ICD-10-CM

## 2021-09-15 LAB — PHOSPHORUS
Phosphorus: 3 mg/dL (ref 2.5–4.6)
Phosphorus: 2.8 mg/dL (ref 2.5–4.6)

## 2021-09-15 LAB — BASIC METABOLIC PANEL
Anion gap: 7 (ref 5–15)
BUN: 8 mg/dL (ref 8–23)
CO2: 25 mmol/L (ref 22–32)
Calcium: 7.6 mg/dL — ABNORMAL LOW (ref 8.9–10.3)
Chloride: 104 mmol/L (ref 98–111)
Creatinine, Ser: 0.36 mg/dL — ABNORMAL LOW (ref 0.44–1.00)
GFR, Estimated: >60 mL/min (ref >60)
Glucose, Bld: 80 mg/dL (ref 70–99)
Potassium: 3.3 mmol/L — ABNORMAL LOW (ref 3.5–5.1)
Sodium: 136 mmol/L (ref 135–145)

## 2021-09-15 LAB — CBC
HCT: 29.3 % — ABNORMAL LOW (ref 36.0–46.0)
Hemoglobin: 9.1 g/dL — ABNORMAL LOW (ref 12.0–15.0)
MCH: 28.6 pg (ref 26.0–34.0)
MCHC: 31.1 g/dL (ref 30.0–36.0)
MCV: 92.1 fL (ref 80.0–100.0)
Platelets: 110 10*3/uL — ABNORMAL LOW (ref 150–400)
RBC: 3.18 MIL/uL — ABNORMAL LOW (ref 3.87–5.11)
RDW: 17.5 % — ABNORMAL HIGH (ref 11.5–15.5)
WBC: 16.3 10*3/uL — ABNORMAL HIGH (ref 4.0–10.5)
nRBC: 0 % (ref 0.0–0.2)

## 2021-09-15 LAB — GLUCOSE, CAPILLARY
Glucose-Capillary: 103 mg/dL — ABNORMAL HIGH (ref 70–99)
Glucose-Capillary: 115 mg/dL — ABNORMAL HIGH (ref 70–99)
Glucose-Capillary: 121 mg/dL — ABNORMAL HIGH (ref 70–99)
Glucose-Capillary: 163 mg/dL — ABNORMAL HIGH (ref 70–99)
Glucose-Capillary: 92 mg/dL (ref 70–99)
Glucose-Capillary: 92 mg/dL (ref 70–99)

## 2021-09-15 LAB — MAGNESIUM
Magnesium: 1.6 mg/dL — ABNORMAL LOW (ref 1.7–2.4)
Magnesium: 2.4 mg/dL (ref 1.7–2.4)

## 2021-09-15 MED ORDER — HYDROCODONE-ACETAMINOPHEN 5-325 MG PO TABS
1.0000 | ORAL_TABLET | Freq: Four times a day (QID) | ORAL | Status: DC | PRN
  Administered 2021-09-15 – 2021-09-16 (×6): 1
  Filled 2021-09-15 (×5): qty 1

## 2021-09-15 MED ORDER — MAGNESIUM SULFATE 4 GM/100ML IV SOLN
4.0000 g | Freq: Once | INTRAVENOUS | Status: AC
Start: 2021-09-15 — End: 2021-09-15
  Administered 2021-09-15: 4 g via INTRAVENOUS
  Filled 2021-09-15: qty 100

## 2021-09-15 MED ORDER — AMIODARONE HCL 200 MG PO TABS
200.0000 mg | ORAL_TABLET | Freq: Every day | ORAL | Status: DC
  Administered 2021-09-16 – 2021-09-28 (×13): 200 mg
  Filled 2021-09-15 (×14): qty 1

## 2021-09-15 MED ORDER — POTASSIUM CHLORIDE 20 MEQ PO PACK
40.0000 meq | PACK | ORAL | Status: AC
  Administered 2021-09-15 (×2): 40 meq
  Filled 2021-09-15 (×3): qty 2

## 2021-09-15 NOTE — Plan of Care (Signed)
  Problem: Nutrition: Goal: Adequate nutrition will be maintained Outcome: Progressing   Problem: Pain Managment: Goal: General experience of comfort will improve Outcome: Progressing   

## 2021-09-15 NOTE — Progress Notes (Signed)
Chaplain engaged in an initial visit with Loreen and her daughter.  Chaplain was able to complete notarization of Advanced Directive, Healthcare POA document.  Chaplain provide three copies to Kizzy's daughter and input one in her medical chart.   Dolorez's daughter shared that she is a Clinical biochemist as well.  Chaplain offered support and presence.      09/15/21 1500  Clinical Encounter Type  Visited With Patient and family together  Visit Type Initial;Social support

## 2021-09-15 NOTE — Progress Notes (Signed)
PT Cancellation Note  Patient Details Name: Kim Ross MRN: [id number] DOB: 1956-08-16   Cancelled Treatment:    Reason Eval/Treat Not Completed: Patient declined, no reason specified. She requested PT check back on tomorrow. Will check back as schedule allows.    [facility]  Office: [phone] Pager: [phone]
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
## 2021-09-15 NOTE — Progress Notes (Signed)
Patient ID: Kim Ross, female   DOB: 1955/12/10, 65 y.o.   MRN: [id number]    Referring Physician(s): Mohamed,Mohamed  Supervising Physician: Markus Daft  Patient Status:  [facility] - In-pt  Chief Complaint: Lung cancer, dysphagia, malnutrition   Subjective: Pt resting quietly; has some soreness at G tube site as expected; afebrile   Allergies: Ibuprofen and Trazodone and nefazodone  Medications: Prior to Admission medications   Medication Sig Start Date End Date Taking? Authorizing Provider  atorvastatin (LIPITOR) 40 MG tablet TAKE 1 TABLET BY MOUTH EVERY DAY Patient taking differently: Take 40 mg by mouth daily. 02/03/21  Yes Milus Banister C, DO  feeding supplement (ENSURE ENLIVE / ENSURE PLUS) LIQD Take 237 mLs by mouth 2 (two) times daily between meals. 06/18/21  Yes Mariel Aloe, MD  fluticasone (FLONASE) 50 MCG/ACT nasal spray Place 1 spray into both nostrils daily. 1 spray in each nostril every day 04/29/21  Yes Gladys Damme, MD  meclizine (ANTIVERT) 25 MG tablet TAKE 1 TABLET BY MOUTH TWICE A DAY 10/09/20  Yes Milus Banister C, DO  metFORMIN (GLUCOPHAGE) 500 MG tablet TAKE 1 TABLET BY MOUTH 2 TIMES DAILY WITH A MEAL. Patient taking differently: Take 250 mg by mouth daily with breakfast. 05/20/21  Yes Erskine Emery, MD  Multiple Vitamins-Minerals (MULTIVITAMIN WITH MINERALS) tablet Take 1 tablet by mouth daily. Patient taking differently: Take 1 tablet by mouth every other day. 04/27/13  Yes Olam Idler, MD  prochlorperazine (COMPAZINE) 10 MG tablet Take 1 tablet (10 mg total) by mouth every 6 (six) hours as needed. 07/08/21  Yes Heilingoetter, Cassandra L, PA-C  sertraline (ZOLOFT) 50 MG tablet TAKE 1 TABLET BY MOUTH EVERY DAY Patient taking differently: Take 50 mg by mouth daily. 11/30/20  Yes Milus Banister C, DO  triamcinolone ointment (KENALOG) 0.5 % APPLY 1 APPLICATION TOPICALLY 2 (TWO) TIMES DAILY. FOR MODERATE TO SEVERE ECZEMA. DO NOT USE FOR MORE THAN 1 WEEK  AT A TIME. Patient taking differently: 1 application See admin instructions. Apply twice daily for moderate to severe eczema.  Do not use for more than 1 week at a time. 08/10/21  Yes Eppie Gibson, MD  fentaNYL (DURAGESIC) 50 MCG/HR Place 1 patch onto the skin every 3 (three) days. 08/26/21   Curt Bears, MD  fluconazole (DIFLUCAN) 100 MG tablet Take 2 tablets today, then 1 tablet daily x 13 more days. Hold atorvastatin while on this medication. 08/26/21   Curt Bears, MD  folic acid (FOLVITE) 1 MG tablet Take 1 tablet (1 mg total) by mouth daily. 08/26/21   Curt Bears, MD  HYDROcodone-acetaminophen (NORCO/VICODIN) 5-325 MG tablet Take 1 tablet by mouth every 6 (six) hours as needed for moderate pain. 08/26/21   Curt Bears, MD     Vital Signs: BP 109/72 (BP Location: Right Arm)    Pulse (!) 101    Temp 97.9 F (36.6 C) (Oral)    Resp 16    Ht 5\' 5"  (1.651 m)    Wt 115 lb 11.9 oz (52.5 kg)    SpO2 91%    BMI 19.26 kg/m   Physical Exam awake, drowsy but arousable; G tube intact, abd soft, ND, insertion site ok, no leaking, mildly tender, 2 T tacks in place  Imaging: IR GASTROSTOMY TUBE MOD SED  Result Date: 09/14/2021 INDICATION: 65 year old with dysphagia, malnutrition and lung cancer. Request for percutaneous gastrostomy tube placement. EXAM: PERCUTANEOUS GASTROSTOMY TUBE WITH FLUOROSCOPIC GUIDANCE Physician: Stephan Minister. Anselm Pancoast, MD MEDICATIONS: Ancef 2 g;  Antibiotics were administered within 1 hour of the procedure. Glucagon 0.5 mg ANESTHESIA/SEDATION: Moderate (conscious) sedation was employed during this procedure. A total of Versed 2.0mg  and fentanyl 100 mcg was administered intravenously at the order of the provider performing the procedure. Total intra-service moderate sedation time: 24 minutes. Patient's level of consciousness and vital signs were monitored continuously by radiology nurse throughout the procedure under the supervision of the provider performing the  procedure. FLUOROSCOPY TIME:  Fluoroscopy Time: 5 minutes, 6 seconds, 8 mGy COMPLICATIONS: None immediate. PROCEDURE: The procedure was explained to the patient. The risks and benefits of the procedure were discussed and the patient's questions were addressed. Informed consent was obtained from the patient. The patient was placed on the interventional table. Liver was identified with ultrasound. Nasogastric tube was placed with fluoroscopic guidance. The anterior abdomen was prepped and draped in sterile fashion. Maximal barrier sterile technique was utilized including caps, mask, sterile gowns, sterile gloves, sterile drape, hand hygiene and skin antiseptic. Stomach was inflated with air. Two T-fasteners were deployed within the stomach using fluoroscopy. Incision was made between the T-fasteners. 18 gauge needle was directed in the stomach with fluoroscopic guidance. Superstiff Amplatz wire was placed. The tract was dilated to accommodate a peel-away sheath. A 14 French Entuit gastrostomy tube was advanced over the wire and through the peel-away sheath. The peel-away sheath and wire were removed. Balloon was inflated with 5 mL of sterile water. Contrast injection confirmed placement in the stomach. Gastrostomy tube was flushed with saline. Fluoroscopic images were obtained for documentation. IMPRESSION: 1. Successful placement of a balloon retention gastrostomy tube. 2. Gastropexy with T-fasteners. Plan for removal of the T-fasteners in 7-14 days. Electronically Signed   By: Markus Daft M.D.   On: 09/14/2021 17:27    Labs:  CBC: Recent Labs    09/12/21 0515 09/13/21 0506 09/14/21 0439 09/15/21 0457  WBC 9.7 11.7* 12.5* 16.3*  HGB 9.5* 10.1* 9.2* 9.1*  HCT 29.9* 32.5* 30.7* 29.3*  PLT 103* 93* 84* 110*    COAGS: Recent Labs    08/25/21 2102 09/14/21 0937  INR 1.3* 1.2  APTT 189*  --     BMP: Recent Labs    11/28/20 1526 01/07/21 1148 09/12/21 0515 09/13/21 0506 09/14/21 0439  09/15/21 0457  NA 143   < > 135 136 136 136  K 4.5   < > 3.8 3.6 3.4* 3.3*  CL 105   < > 100 101 102 104  CO2 19*   < > 28 27 27 25   GLUCOSE 116*   < > 70 81 73 80  BUN 9   < > 8 8 8 8   CALCIUM 9.4   < > 8.0* 8.3* 7.8* 7.6*  CREATININE 0.93   < > 0.33* 0.39* 0.31* 0.36*  GFRNONAA 66   < > >60 >60 >60 >60  GFRAA 76  --   --   --   --   --    < > = values in this interval not displayed.    LIVER FUNCTION TESTS: Recent Labs    08/25/21 1149 08/25/21 1620 08/26/21 0017 08/26/21 2015 08/29/21 0257  BILITOT 0.4 0.8 0.6  --  0.3  AST 17 17 16   --  22  ALT 10 11 11   --  11  ALKPHOS 301* 262* 237*  --  166*  PROT 6.7 6.8 6.1*  --  5.0*  ALBUMIN 1.8* 2.1* 2.0* 2.1* 1.6*    Assessment and Plan: Pt with hx  lung cancer, dysphagia, malnutrition; s/p perc G tube 12/12; afebrile; WBC 16.3(12.5), hgb 9.1(9.2), K 3.3, creat 0.36; G tube site stable; ok to use tube as needed ; T tacks to be removed in 7-14 days   Electronically Signed: D. Rowe Robert, PA-C 09/15/2021, 9:45 AM   I spent a total of 15 Minutes at the the patient's bedside AND on the patient's hospital floor or unit, greater than 50% of which was counseling/coordinating care for gastrostomy tube

## 2021-09-16 ENCOUNTER — Telehealth: Payer: Self-pay

## 2021-09-16 ENCOUNTER — Inpatient Hospital Stay: Payer: 59 | Admitting: Internal Medicine

## 2021-09-16 ENCOUNTER — Encounter: Payer: Self-pay | Admitting: General Practice

## 2021-09-16 ENCOUNTER — Inpatient Hospital Stay: Payer: 59

## 2021-09-16 ENCOUNTER — Telehealth: Payer: Self-pay | Admitting: Medical Oncology

## 2021-09-16 ENCOUNTER — Inpatient Hospital Stay: Payer: 59 | Admitting: Dietician

## 2021-09-16 LAB — BASIC METABOLIC PANEL
Anion gap: 8 (ref 5–15)
BUN: 8 mg/dL (ref 8–23)
CO2: 23 mmol/L (ref 22–32)
Calcium: 7.7 mg/dL — ABNORMAL LOW (ref 8.9–10.3)
Chloride: 102 mmol/L (ref 98–111)
Creatinine, Ser: 0.44 mg/dL (ref 0.44–1.00)
GFR, Estimated: >60 mL/min (ref >60)
Glucose, Bld: 169 mg/dL — ABNORMAL HIGH (ref 70–99)
Potassium: 3.9 mmol/L (ref 3.5–5.1)
Sodium: 133 mmol/L — ABNORMAL LOW (ref 135–145)

## 2021-09-16 LAB — CBC
HCT: 30.2 % — ABNORMAL LOW (ref 36.0–46.0)
Hemoglobin: 9.3 g/dL — ABNORMAL LOW (ref 12.0–15.0)
MCH: 28.4 pg (ref 26.0–34.0)
MCHC: 30.8 g/dL (ref 30.0–36.0)
MCV: 92.4 fL (ref 80.0–100.0)
Platelets: 151 10*3/uL (ref 150–400)
RBC: 3.27 MIL/uL — ABNORMAL LOW (ref 3.87–5.11)
RDW: 18.1 % — ABNORMAL HIGH (ref 11.5–15.5)
WBC: 17.9 10*3/uL — ABNORMAL HIGH (ref 4.0–10.5)
nRBC: 0.2 % (ref 0.0–0.2)

## 2021-09-16 LAB — GLUCOSE, CAPILLARY
Glucose-Capillary: 108 mg/dL — ABNORMAL HIGH (ref 70–99)
Glucose-Capillary: 114 mg/dL — ABNORMAL HIGH (ref 70–99)
Glucose-Capillary: 114 mg/dL — ABNORMAL HIGH (ref 70–99)
Glucose-Capillary: 141 mg/dL — ABNORMAL HIGH (ref 70–99)
Glucose-Capillary: 165 mg/dL — ABNORMAL HIGH (ref 70–99)
Glucose-Capillary: 89 mg/dL (ref 70–99)

## 2021-09-16 LAB — MAGNESIUM
Magnesium: 1.7 mg/dL (ref 1.7–2.4)
Magnesium: 1.5 mg/dL — ABNORMAL LOW (ref 1.7–2.4)

## 2021-09-16 LAB — PHOSPHORUS
Phosphorus: 2 mg/dL — ABNORMAL LOW (ref 2.5–4.6)
Phosphorus: 1.4 mg/dL — ABNORMAL LOW (ref 2.5–4.6)

## 2021-09-16 MED ORDER — HYDROCODONE-ACETAMINOPHEN 5-325 MG PO TABS
1.0000 | ORAL_TABLET | Freq: Once | ORAL | Status: AC
  Filled 2021-09-16: qty 1

## 2021-09-16 MED ORDER — HYDROCODONE-ACETAMINOPHEN 5-325 MG PO TABS
1.0000 | ORAL_TABLET | ORAL | Status: DC | PRN
  Administered 2021-09-16 – 2021-09-25 (×17): 1 via ORAL
  Filled 2021-09-16 (×19): qty 1

## 2021-09-16 MED ORDER — POTASSIUM & SODIUM PHOSPHATES 280-160-250 MG PO PACK
1.0000 | PACK | Freq: Three times a day (TID) | ORAL | Status: DC
  Administered 2021-09-16 – 2021-09-17 (×4): 1 via ORAL
  Filled 2021-09-16 (×4): qty 1

## 2021-09-16 NOTE — TOC Progression Note (Addendum)
Transition of Care [name]) - Progression Note    Patient Details  Name: Kim Ross MRN: [id number] Date of Birth: 05-12-1956  Transition of Care [facility] CM/SW Contact  Fin Hupp, Juliann Pulse, RN Phone Number: 09/16/2021, 12:50 PM  Clinical Narrative:  Damaris Schooner to patient(A+0x3) who would be the primary contact person while she is in the hospital to update on her medical care-[name] Headen per patient. Patient in agreement to SNF-will await bed offers.  -TF currently not at goal of 75ml/hr-will await once at bolus feeds for appropriate SNF.   Expected Discharge Plan: [place] Barriers to Discharge: Continued Medical Work up  Expected Discharge Plan and Services Expected Discharge Plan: [place]   Discharge Planning Services: CM Consult   Living arrangements for the past 2 months: Single Family Home                                       Social Determinants of Health (SDOH) Interventions    Readmission Risk Interventions No flowsheet data found.
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
## 2021-09-16 NOTE — Progress Notes (Signed)
Physical Therapy Treatment Patient Details Name: Kim Ross MRN: [id number] DOB: 1956/09/10 Today's Date: 09/16/2021   History of Present Illness 65yo female who presented on 11/22 after having palpitations while at [facility] center; found to be in Afib with RVR and transferred to ED. PE negative but did have pleural effusion, addressed with thoracentesis on 11/23. 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 First, attempted standing from recliner using STEDY. Pt unable to fully stand and LEs buckled/gave way. Next, performed a scoot transfer, recliner to bed.  Pt is not progressing toward goals -pt has had a decline in funtional status and an increase in level of assist required to mobilize. PT rec has been updated to SNF however pt may prefer to return home with HHPT f/u but she will need 24/7 care and significant assistance.   Recommendations for follow up therapy are one component of a multi-disciplinary discharge planning process, led by the attending physician.  Recommendations may be updated based on patient status, additional functional criteria and insurance authorization.  Follow Up Recommendations  Skilled nursing-short term rehab (<3 hours/day)     Assistance Recommended at Discharge Frequent or constant Supervision/Assistance  Equipment Recommendations  BSC/3in1;Wheelchair   Recommendations for Other Services       Precautions / Restrictions Precautions Precautions: Fall Precaution Comments: high fracture risk due CA, L LE pain, watch vitals Restrictions Weight Bearing Restrictions: No     Mobility  Bed Mobility Overal bed mobility: Needs Assistance Bed Mobility: Supine to Sit Rolling: Mod assist     Sit to supine: Mod assist   General bed mobility comments: Assist to repositon LEs and trunk in bed. Uncontrolled descent into supine.    Transfers Overall transfer level: Needs  assistance Equipment used: Rolling walker (2 wheels) Transfers: Sit to/from Stand Sit to Stand: Max assist        Lateral/Scoot Transfers: Mod assist General transfer comment: With use of STEDY, Max A to rise from low recliner. Pt only able to partially stand briefly before LEs buckled. Assisted back into recliner then performed a scoot transfer, drop arm recliner to bed with Mod A to weight shift and scoot.    Ambulation/Gait               General Gait Details: NT-too weak   Stairs             Wheelchair Mobility    Modified Rankin (Stroke Patients Only)       Balance Overall balance assessment: Needs assistance Sitting-balance support: Feet supported;Bilateral upper extremity supported Sitting balance-Leahy Scale: Fair Sitting balance - Comments: mild posterior lean at times but no loss of balance.   Standing balance support: Bilateral upper extremity supported Standing balance-Leahy Scale: Zero Standing balance comment: reliant on  support of therapist                            Cognition Arousal/Alertness: Awake/alert Behavior During Therapy: WFL for tasks assessed/performed Overall Cognitive Status: Within Functional Limits for tasks assessed                                          Exercises      General Comments        Pertinent  Vitals/Pain Pain Assessment: Faces Faces Pain Scale: Hurts little more Pain Location: PEG tube site, L LE Pain Descriptors / Indicators: Discomfort;Sore;Grimacing Pain Intervention(s): Limited activity within patient's tolerance;Monitored during session;Repositioned    Home Living                          Prior Function            PT Goals (current goals can now be found in the care plan section) Progress towards PT goals: Not progressing toward goals - comment (pt has had a decline in funtional status and an increase in level of assist required to mobilize)     Frequency    Min 3X/week      PT Plan Discharge plan needs to be updated    Co-evaluation              AM-PAC PT "6 Clicks" Mobility   Outcome Measure  Help needed turning from your back to your side while in a flat bed without using bedrails?: A Little Help needed moving from lying on your back to sitting on the side of a flat bed without using bedrails?: A Lot Help needed moving to and from a bed to a chair (including a wheelchair)?: A Lot Help needed standing up from a chair using your arms (e.g., wheelchair or bedside chair)?: Total Help needed to walk in hospital room?: Total Help needed climbing 3-5 steps with a railing? : Total 6 Click Score: 10    End of Session   Activity Tolerance: Patient limited by fatigue;Patient limited by pain Patient left: in bed;with call bell/phone within reach;with family/visitor present   PT Visit Diagnosis: Muscle weakness (generalized) (M62.81);Pain;Difficulty in walking, not elsewhere classified (R26.2) Pain - Right/Left: Left Pain - part of body: Leg     Time: 2119-4174 PT Time Calculation (min) (ACUTE ONLY): 20 min  Charges:  $Therapeutic Activity: 8-22 mins                         Doreatha Massed, PT Acute Rehabilitation  Office: [phone] Pager: [phone]

## 2021-09-16 NOTE — Progress Notes (Signed)
° °                                                                                                                                                          °  Patient Name: Kim Ross MRN: [id number] DOB: Feb 28, 1956 Referring Physician: Erskine Emery Date of Service: 08/04/2021 [facility], [place]                                                        End Of Treatment Note  Diagnoses: C79.31-Secondary malignant neoplasm of brain  Cancer Staging: STAGE IV  Intent: Palliative  Radiation Treatment Dates: 07/27/21  Site Technique Total Dose (Gy) Dose per Fx (Gy) Completed Fx Beam Energies  Brain: Brain_SRS 3D 20/20 20 1/1 6XFFF   Right frontal 1cm target was treated using radiosurgical technique, 20 Gray in 1 fraction.  6 MV photons, flattening filter free, were used. ExacTrac Snap verification was performed for each couch angle.  Narrative: The patient tolerated radiation therapy to brain relatively well.   Plan: The patient will follow-up with Dr. Isidore Moos in radiation oncology in 51mo.  -----------------------------------  Eppie Gibson, MD

## 2021-09-16 NOTE — Telephone Encounter (Signed)
Voice message received from [facility] stating he is pts nephew/attorney and needing a letter stating pts dx to take to court to assist with a legal case she has is progress. Marchia Bond states he can be reached at [phone].

## 2021-09-16 NOTE — Telephone Encounter (Signed)
Spoke to Kim Ross and her nurse Lanae and Yetta Glassman listened on the phone that pt gave me verbal permission to speak to Leana Roe regarding her diagnosis and to speak on her behalf regarding legal issues.

## 2021-09-16 NOTE — Progress Notes (Addendum)
Occupational Therapy Treatment Patient Details Name: Kim Ross MRN: [id number] DOB: 1956-07-11 Today's Date: 09/16/2021   History of present illness 65yo female who presented on 11/22 after having palpitations while at [facility] center; found to be in Afib with RVR and transferred to ED. PE negative but did have pleural effusion, addressed with thoracentesis on 11/23. 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 Today patient mod assist to transfer to side of bed, to stand and pivot to recliner. With one step towards chair and use of walker patient's legs began to buckle and therapist had to physically assist to complete the pivot. She was unable to don socks and has been total assist for toileting at bed level. Patient performed grooming task in recliner and fatigued quickly with limited activity. Patient has not made progress during long hospital stay. Therapist will downgrade goals. Patient and spouse wanting patient to get stronger and be more independent like prior to hospitalization therefore recommending short term rehab at discharge.    Recommendations for follow up therapy are one component of a multi-disciplinary discharge planning process, led by the attending physician.  Recommendations may be updated based on patient status, additional functional criteria and insurance authorization.    Follow Up Recommendations  Skilled nursing-short term rehab (<3 hours/day)    Assistance Recommended at Discharge Frequent or constant Supervision/Assistance  Equipment Recommendations  BSC/3in1;Wheelchair (measurements OT);Hospital bed    Recommendations for Other Services      Precautions / Restrictions Precautions Precautions: Fall Precaution Comments: high fracture risk due CA, L LE pain, watch vitals, Restrictions Weight Bearing Restrictions: No       Mobility Bed Mobility Overal bed  mobility: Needs Assistance Bed Mobility: Supine to Sit Rolling: Mod assist         General bed mobility comments: Mod assist to transfer to side of bed - needing assistance for trunk negotiation and use of bed patient to pivot hips and to scoot forward.    Transfers Overall transfer level: Needs assistance Equipment used: Rolling walker (2 wheels) Transfers: Sit to/from Stand;Bed to chair/wheelchair/BSC Sit to Stand: Mod assist;From elevated surface Stand pivot transfers: Mod assist Step pivot transfers: Mod assist       General transfer comment: Mod assist to rise from elevated bed height. needed to sit down after one step. Therapist placed recliner closer. Patient able to rise again from higher bed height and able to take one step with walker - then legs began to buckle and therapist assisted with completing pivot to recliner.     Balance Overall balance assessment: Needs assistance Sitting-balance support: No upper extremity supported Sitting balance-Leahy Scale: Fair Sitting balance - Comments: mild posterior lean at times but no loss of balance.   Standing balance support: Bilateral upper extremity supported Standing balance-Leahy Scale: Poor Standing balance comment: reliant on  support of therapist                           ADL either performed or assessed with clinical judgement   ADL Overall ADL's : Needs assistance/impaired     Grooming: Oral care;Wash/dry face;Sitting Grooming Details (indicate cue type and reason): patient transferred to recliner. Patietn set up to perform oral care and wash face. Oral care with decreased quality. Patient fatigued with minimal activity.  Lower Body Dressing: Total assistance Lower Body Dressing Details (indicate cue type and reason): total assist to don socks - unable to reach feet due to pain at peg site and generalized weakness.                    Extremity/Trunk Assessment Upper Extremity  Assessment Upper Extremity Assessment: Generalized weakness   Lower Extremity Assessment Lower Extremity Assessment: Defer to PT evaluation   Cervical / Trunk Assessment Cervical / Trunk Assessment: Normal    Vision Patient Visual Report: No change from baseline     Perception     Praxis      Cognition Arousal/Alertness: Awake/alert Behavior During Therapy: WFL for tasks assessed/performed Overall Cognitive Status: Within Functional Limits for tasks assessed                                            Exercises     Shoulder Instructions       General Comments      Pertinent Vitals/ Pain       Pain Assessment: Faces Faces Pain Scale: Hurts a little bit Pain Location: peg tube site Pain Descriptors / Indicators: Aching;Grimacing Pain Intervention(s): Limited activity within patient's tolerance;Premedicated before session  Home Living                                          Prior Functioning/Environment              Frequency  Min 2X/week        Progress Toward Goals  OT Goals(current goals can now be found in the care plan section)  Progress towards OT goals: Not progressing toward goals - comment  Acute Rehab OT Goals Patient Stated Goal: to get stronger OT Goal Formulation: With patient/family Time For Goal Achievement: 09/30/21 Potential to Achieve Goals: [place] Discharge plan needs to be updated    Co-evaluation                 AM-PAC OT "6 Clicks" Daily Activity     Outcome Measure   Help from another person eating meals?: A Little Help from another person taking care of personal grooming?: A Little Help from another person toileting, which includes using toliet, bedpan, or urinal?: Total Help from another person bathing (including washing, rinsing, drying)?: A Lot Help from another person to put on and taking off regular upper body clothing?: A Little Help from another person to put on and  taking off regular lower body clothing?: Total 6 Click Score: 13    End of Session Equipment Utilized During Treatment: Rolling walker (2 wheels)  OT Visit Diagnosis: Unsteadiness on feet (R26.81);Muscle weakness (generalized) (M62.81);History of falling (Z91.81);Pain   Activity Tolerance Patient limited by fatigue   Patient Left in chair;with call bell/phone within reach;with family/visitor present   Nurse Communication  (okay to see)        Time: 0832-0900 OT Time Calculation (min): 28 min  Charges: OT General Charges $OT Visit: 1 Visit OT Treatments $Self Care/Home Management : 8-22 mins $Therapeutic Activity: 8-22 mins  Jartavious Mckimmy, OTR/L [place]  Office [phone] Pager: [place] 09/16/2021, 10:10 AM

## 2021-09-16 NOTE — NC FL2 (Signed)
[place] LEVEL OF CARE SCREENING TOOL     IDENTIFICATION  Patient Name: Kim Ross Birthdate: 18-Apr-1956 Sex: female Admission Date (Current Location): 08/25/2021  [facility] and [place] Number:  Herbalist and Address:  [facility],  [address], [place]      Provider Number: [id number]  Attending Physician Name and Address:  Flora Lipps, MD  Relative Name and Phone Number:  Rocky Crafts [phone]    Current Level of Care: Hospital Recommended Level of Care: [place] Prior Approval Number:    Date Approved/Denied:   PASRR Number: [id number]  Discharge Plan: SNF    Current Diagnoses: Patient Active Problem List   Diagnosis Date Noted   Thrombocytopenia ([place])    Chemotherapy-induced neutropenia ([place])    A-fib ([place]) 08/26/2021   Protein-calorie malnutrition, severe 08/26/2021   Pressure injury of skin 08/26/2021   Atrial fibrillation with RVR ([place]) 08/25/2021   Pleural effusion 08/25/2021   Brain metastasis ([place]) 07/15/2021   Adenocarcinoma of left lung, stage 4 ([place]) 07/08/2021   Weight loss 07/08/2021   Lung cancer ([place]) 06/24/2021   Cancer related pain 06/24/2021   Malnutrition of moderate degree 06/16/2021   Tobacco abuse 06/15/2021   Lobar pneumonia ([place]) 06/15/2021   SIRS (systemic inflammatory response syndrome) ([place]) 06/14/2021   Diabetes ([place]) 06/14/2021   Dyspnea 05/03/2021   Lung mass 05/03/2021   Fatigue 03/26/2021   Hyperkeratosis of nail 03/11/2021   Prediabetes 02/05/2021   Stress at home 02/05/2021   Psychophysiological insomnia 02/05/2021   Dry skin 11/17/2020   Onychomycosis of left great toe 09/05/2019   Seasonal allergies 12/30/2017   Hyperlipidemia 12/30/2017   UTI (urinary tract infection) 02/09/2017   Hematuria 04/01/2015   History of recurrent UTIs 01/31/2014   Vertigo, intermittent 06/27/2013   Generalized anxiety disorder 04/10/2013   Irritable  bowel syndrome 10/19/2012   Tobacco use disorder 07/20/2012    Orientation RESPIRATION BLADDER Height & Weight     Self, Time, Situation, Place  Normal Indwelling catheter Weight: 52.5 kg Height:  5\' 5"  (165.1 cm)  BEHAVIORAL SYMPTOMS/MOOD NEUROLOGICAL BOWEL NUTRITION STATUS      Continent Feeding tube (Osmolite see MAR)  AMBULATORY STATUS COMMUNICATION OF NEEDS Skin     Verbally Surgical wounds (GT site)                       Personal Care Assistance Level of Assistance  Bathing, Feeding, Dressing Bathing Assistance: Limited assistance Feeding assistance: Limited assistance Dressing Assistance: Limited assistance     Functional Limitations Info  Sight, Hearing, Speech Sight Info: Adequate Hearing Info: Adequate Speech Info: Impaired (Partials-top)    SPECIAL CARE FACTORS FREQUENCY  PT (By licensed PT), OT (By licensed OT)     PT Frequency:  (5x week) OT Frequency:  (5x week)            Contractures Contractures Info: Not present    Additional Factors Info  Code Status, Allergies Code Status Info:  (DNR) Allergies Info:  (Ibuprofen,Trazadone/Nefazodone)           Current Medications (09/16/2021):  This is the current hospital active medication list Current Facility-Administered Medications  Medication Dose Route Frequency Provider Last Rate Last Admin   0.9 %  sodium chloride infusion   Intravenous PRN Shelly Coss, MD   Stopped at 09/07/21 1015   amiodarone (PACERONE) tablet 200 mg  200 mg Per Tube Daily [place] Donnamarie Poag, DO  200 mg at 09/16/21 1000   atorvastatin (LIPITOR) tablet 40 mg  40 mg Per Tube Daily Minda Ditto, RPH   40 mg at 09/16/21 1000   Chlorhexidine Gluconate Cloth 2 % PADS 6 each  6 each Topical Q0600 Shelly Coss, MD   6 each at 09/15/21 0646   feeding supplement (ENSURE ENLIVE / ENSURE PLUS) liquid 237 mL  237 mL Per Tube TID BM Green, Terri L, RPH   237 mL at 09/15/21 2112   feeding supplement (OSMOLITE 1.5 CAL) liquid 1,000  mL  1,000 mL Per Tube Continuous [place] Eric J, DO 20 mL/hr at 09/15/21 1742 1,000 mL at 09/15/21 1742   fentaNYL (DURAGESIC) 50 MCG/HR 1 patch  1 patch Transdermal Q72H Clarene Essex, MD   1 patch at 09/16/21 1000   fluconazole (DIFLUCAN) tablet 100 mg  100 mg Per Tube Daily Minda Ditto, RPH   100 mg at 86/57/84 6962   folic acid (FOLVITE) tablet 1 mg  1 mg Per Tube Daily Green, Terri L, RPH   1 mg at 09/16/21 1000   free water 100 mL  100 mL Per Tube Q4H [place] Donnamarie Poag, DO   100 mL at 09/16/21 0405   guaiFENesin-dextromethorphan (ROBITUSSIN DM) 100-10 MG/5ML syrup 5 mL  5 mL Per Tube Q4H PRN Minda Ditto, RPH   5 mL at 09/15/21 1032   HYDROcodone-acetaminophen (NORCO/VICODIN) 5-325 MG per tablet 1 tablet  1 tablet Per Tube Q6H PRN Kathryne Eriksson, NP   1 tablet at 09/16/21 1158   HYDROcodone-acetaminophen (NORCO/VICODIN) 5-325 MG per tablet 1 tablet  1 tablet Oral Once Pokhrel, Laxman, MD       insulin aspart (novoLOG) injection 0-9 Units  0-9 Units Subcutaneous TID WC Clarene Essex, MD   1 Units at 09/16/21 9528   magic mouthwash  5 mL Oral QID Shelly Coss, MD   5 mL at 09/15/21 2134   magnesium oxide (MAG-OX) tablet 800 mg  800 mg Per Tube Daily Minda Ditto, RPH   800 mg at 09/16/21 1000   meclizine (ANTIVERT) tablet 25 mg  25 mg Per Tube BID PRN Minda Ditto, RPH       MEDLINE mouth rinse  15 mL Mouth Rinse BID Clarene Essex, MD   15 mL at 09/15/21 2135   melatonin tablet 3 mg  3 mg Per Tube QHS [place] Cowden, Terri L, RPH   3 mg at 09/15/21 2121   midodrine (PROAMATINE) tablet 2.5 mg  2.5 mg Per Tube TID WC Green, Terri L, RPH   2.5 mg at 09/16/21 1129   mineral oil-hydrophilic petrolatum (AQUAPHOR) ointment   Topical BID PRN Clarene Essex, MD   Given at 08/26/21 1440   mirtazapine (REMERON SOL-TAB) disintegrating tablet 15 mg  15 mg Oral QHS Minda Ditto, RPH   15 mg at 09/15/21 2121   multivitamin liquid 15 mL  15 mL Per Tube QODAY Green, Terri L, RPH   15 mL at 09/15/21 1033    ondansetron (ZOFRAN) injection 4 mg  4 mg Intravenous Q6H PRN Clarene Essex, MD   4 mg at 09/16/21 1158   pantoprazole sodium (PROTONIX) 40 mg/20 mL oral suspension 40 mg  40 mg Per Tube BID [place] Cowden, Terri L, RPH   40 mg at 09/16/21 1000   potassium & sodium phosphates (PHOS-NAK) 280-160-250 MG packet 1 packet  1 packet Oral TID WC & HS Pokhrel, Laxman, MD       sertraline (ZOLOFT)  tablet 50 mg  50 mg Per Tube Daily Minda Ditto, RPH   50 mg at 09/16/21 1000   tamsulosin (FLOMAX) capsule 0.4 mg  0.4 mg Oral QPC supper Clarene Essex, MD   0.4 mg at 09/15/21 1749   zinc oxide 20 % ointment   Topical TID Clarene Essex, MD   Given at 09/15/21 2136     Discharge Medications: Please see discharge summary for a list of discharge medications.  Relevant Imaging Results:  Relevant Lab Results:   Additional Information SS#[id number];[employer] x2;Booster x1  Chandan Fly, Juliann Pulse, RN

## 2021-09-17 ENCOUNTER — Encounter: Payer: Self-pay | Admitting: Internal Medicine

## 2021-09-17 LAB — MAGNESIUM
Magnesium: 1.2 mg/dL — ABNORMAL LOW (ref 1.7–2.4)
Magnesium: 1.1 mg/dL — ABNORMAL LOW (ref 1.7–2.4)

## 2021-09-17 LAB — CBC
HCT: 28.4 % — ABNORMAL LOW (ref 36.0–46.0)
Hemoglobin: 8.8 g/dL — ABNORMAL LOW (ref 12.0–15.0)
MCH: 28.6 pg (ref 26.0–34.0)
MCHC: 31 g/dL (ref 30.0–36.0)
MCV: 92.2 fL (ref 80.0–100.0)
Platelets: 156 10*3/uL (ref 150–400)
RBC: 3.08 MIL/uL — ABNORMAL LOW (ref 3.87–5.11)
RDW: 18.3 % — ABNORMAL HIGH (ref 11.5–15.5)
WBC: 18.3 10*3/uL — ABNORMAL HIGH (ref 4.0–10.5)
nRBC: 0.3 % — ABNORMAL HIGH (ref 0.0–0.2)

## 2021-09-17 LAB — COMPREHENSIVE METABOLIC PANEL
ALT: 9 U/L (ref 0–44)
AST: 19 U/L (ref 15–41)
Albumin: 1.5 g/dL — ABNORMAL LOW (ref 3.5–5.0)
Alkaline Phosphatase: 260 U/L — ABNORMAL HIGH (ref 38–126)
Anion gap: 6 (ref 5–15)
BUN: 9 mg/dL (ref 8–23)
CO2: 27 mmol/L (ref 22–32)
Calcium: 7.7 mg/dL — ABNORMAL LOW (ref 8.9–10.3)
Chloride: 102 mmol/L (ref 98–111)
Creatinine, Ser: 0.37 mg/dL — ABNORMAL LOW (ref 0.44–1.00)
GFR, Estimated: >60 mL/min (ref >60)
Glucose, Bld: 126 mg/dL — ABNORMAL HIGH (ref 70–99)
Potassium: 3.6 mmol/L (ref 3.5–5.1)
Sodium: 135 mmol/L (ref 135–145)
Total Bilirubin: 0.2 mg/dL — ABNORMAL LOW (ref 0.3–1.2)
Total Protein: 4.8 g/dL — ABNORMAL LOW (ref 6.5–8.1)

## 2021-09-17 LAB — PHOSPHORUS
Phosphorus: 1.6 mg/dL — ABNORMAL LOW (ref 2.5–4.6)
Phosphorus: 1.7 mg/dL — ABNORMAL LOW (ref 2.5–4.6)

## 2021-09-17 LAB — GLUCOSE, CAPILLARY
Glucose-Capillary: 124 mg/dL — ABNORMAL HIGH (ref 70–99)
Glucose-Capillary: 130 mg/dL — ABNORMAL HIGH (ref 70–99)
Glucose-Capillary: 125 mg/dL — ABNORMAL HIGH (ref 70–99)
Glucose-Capillary: 105 mg/dL — ABNORMAL HIGH (ref 70–99)
Glucose-Capillary: 128 mg/dL — ABNORMAL HIGH (ref 70–99)

## 2021-09-17 MED ORDER — POTASSIUM PHOSPHATES 15 MMOLE/5ML IV SOLN
30.0000 mmol | Freq: Once | INTRAVENOUS | Status: AC
  Administered 2021-09-17: 30 mmol via INTRAVENOUS
  Filled 2021-09-17: qty 10

## 2021-09-17 MED ORDER — ENOXAPARIN SODIUM 60 MG/0.6ML IJ SOSY
1.0000 mg/kg | PREFILLED_SYRINGE | Freq: Two times a day (BID) | INTRAMUSCULAR | Status: DC
  Administered 2021-09-17 – 2021-09-22 (×11): 55 mg via SUBCUTANEOUS
  Filled 2021-09-17 (×11): qty 0.6

## 2021-09-17 MED ORDER — MAGNESIUM SULFATE 2 GM/50ML IV SOLN
2.0000 g | Freq: Once | INTRAVENOUS | Status: AC
  Administered 2021-09-17: 2 g via INTRAVENOUS
  Filled 2021-09-17: qty 50

## 2021-09-17 NOTE — Progress Notes (Signed)
ANTICOAGULATION CONSULT NOTE   Pharmacy Consult for Lovenox Indication: DVT  Allergies  Allergen Reactions   Ibuprofen Nausea And Vomiting   Trazodone And Nefazodone     AM Dizziness & Drowsiness     Patient Measurements: Height: 5\' 5"  (165.1 cm) Weight: 55.3 kg (121 lb 14.6 oz) IBW/kg (Calculated) : 57  Vital Signs: Temp: 98 F (36.7 C) (12/15 1158) Temp Source: Oral (12/15 1158) BP: 131/78 (12/15 1158) Pulse Rate: 105 (12/15 1158)  Labs: Recent Labs    09/15/21 0457 09/16/21 0502 09/17/21 0455  HGB 9.1* 9.3* 8.8*  HCT 29.3* 30.2* 28.4*  PLT 110* 151 156  CREATININE 0.36* 0.44 0.37*    Estimated Creatinine Clearance: 62.8 mL/min (A) (by C-G formula based on SCr of 0.37 mg/dL (L)).   Medical History: Past Medical History:  Diagnosis Date   Acute pain of left shoulder 12/01/2020   Anxiety    Aspiration pneumonitis ([place]) 08/05/2019   Breast cancer screening 12/30/2017   Chest wall pain 11/05/2019   Cocaine abuse ([place])    Depression    Drug intoxication ([place]) 08/05/2019   Encounter for screening for HIV 08/05/2019   Goiter 09/05/2019   History of radiation therapy    Brain, left chest 07/13/21-08/04/21- Dr. Gery Pray   History of recurrent UTIs    No-show for appointment 09/24/2020   Pneumonia    Pre-diabetes    Urinary frequency 04/10/2013    Medications:  Scheduled:   amiodarone  200 mg Per Tube Daily   atorvastatin  40 mg Per Tube Daily   Chlorhexidine Gluconate Cloth  6 each Topical Q0600   feeding supplement  237 mL Per Tube TID BM   fentaNYL  1 patch Transdermal Q72H   fluconazole  100 mg Per Tube Daily   folic acid  1 mg Per Tube Daily   free water  100 mL Per Tube Q4H   insulin aspart  0-9 Units Subcutaneous TID WC   magic mouthwash  5 mL Oral QID   magnesium oxide  800 mg Per Tube Daily   mouth rinse  15 mL Mouth Rinse BID   melatonin  3 mg Per Tube QHS   midodrine  2.5 mg Per Tube TID WC   mirtazapine  15 mg Oral QHS    multivitamin  15 mL Per Tube QODAY   pantoprazole sodium  40 mg Per Tube BID   sertraline  50 mg Per Tube Daily   tamsulosin  0.4 mg Oral QPC supper   zinc oxide   Topical TID   Infusions:   sodium chloride Stopped (09/07/21 1015)   feeding supplement (OSMOLITE 1.5 CAL) 30 mL/hr at 09/16/21 2000   magnesium sulfate bolus IVPB     potassium PHOSPHATE IVPB (in mmol)     PRN: sodium chloride, guaiFENesin-dextromethorphan, HYDROcodone-acetaminophen, meclizine, mineral oil-hydrophilic petrolatum, ondansetron (ZOFRAN) IV  Assessment: 65 yo female 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 Patient was started on heparin drip then transitioned to lovenox for afib.  Then was found to have DVT on 08/26/21.  Lovenox was dc'd on 09/02/21 due to thrombocytopenia for which she was given transfusion on 09/11/21.  Platelets have been stable now, so Pharmacy consulted to resume Lovenox on 09/17/21.  Goal of Therapy:  Anti-Xa level 0.6-1 units/ml 4hrs after LMWH dose given Monitor platelets by anticoagulation protocol: Yes   Plan:  Lovenox 1mg /kg SQ q12h Monitor renal function,  CBC, sign/symptoms of bleeding  Peggyann Juba, PharmD, BCPS Pharmacy: [phone] 09/17/2021,12:58 PM

## 2021-09-17 NOTE — TOC Progression Note (Signed)
Transition of Care [facility]) - Progression Note    Patient Details  Name: Kim Ross MRN: [id number] Date of Birth: 05-25-1956  Transition of Care [facility]) CM/SW Contact  Hansel Devan, Juliann Pulse, RN Phone Number: 09/17/2021, 1:47 PM  Clinical Narrative: currently no bed offers-informed primary contact person/patient. Will need to know -TF status-SNF prefer bolus feeds or po if possible;facilities prefer chemo on hold until after rehab-please confirm if no chemo or hold till after rehab.      Expected Discharge Plan: [place] Barriers to Discharge: Continued Medical Work up  Expected Discharge Plan and Services Expected Discharge Plan: [place]   Discharge Planning Services: CM Consult   Living arrangements for the past 2 months: Single Family Home                                       Social Determinants of Health (SDOH) Interventions    Readmission Risk Interventions No flowsheet data found.
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
## 2021-09-18 LAB — GLUCOSE, CAPILLARY
Glucose-Capillary: 112 mg/dL — ABNORMAL HIGH (ref 70–99)
Glucose-Capillary: 138 mg/dL — ABNORMAL HIGH (ref 70–99)
Glucose-Capillary: 84 mg/dL (ref 70–99)
Glucose-Capillary: 104 mg/dL — ABNORMAL HIGH (ref 70–99)
Glucose-Capillary: 98 mg/dL (ref 70–99)

## 2021-09-18 LAB — MAGNESIUM
Magnesium: 1.5 mg/dL — ABNORMAL LOW (ref 1.7–2.4)
Magnesium: 1.3 mg/dL — ABNORMAL LOW (ref 1.7–2.4)

## 2021-09-18 LAB — PHOSPHORUS
Phosphorus: 3.6 mg/dL (ref 2.5–4.6)
Phosphorus: 2.6 mg/dL (ref 2.5–4.6)

## 2021-09-18 MED ORDER — DICYCLOMINE HCL 10 MG/5ML PO SOLN
10.0000 mg | Freq: Three times a day (TID) | ORAL | Status: DC
Start: 2021-09-18 — End: 2021-09-29
  Administered 2021-09-18 – 2021-09-28 (×40): 10 mg
  Filled 2021-09-18 (×46): qty 5

## 2021-09-18 MED ORDER — OSMOLITE 1.5 CAL PO LIQD
237.0000 mL | Freq: Every day | ORAL | Status: DC
  Administered 2021-09-18 – 2021-09-20 (×9): 237 mL
  Administered 2021-09-21: 06:00:00 120 mL
  Filled 2021-09-18 (×16): qty 237

## 2021-09-18 MED ORDER — POLYETHYLENE GLYCOL 3350 17 G PO PACK
17.0000 g | PACK | Freq: Every day | ORAL | Status: DC
  Administered 2021-09-18 – 2021-09-24 (×4): 17 g via ORAL
  Filled 2021-09-18 (×5): qty 1

## 2021-09-18 MED ORDER — FREE WATER
120.0000 mL | Freq: Every day | Status: DC
  Administered 2021-09-18 – 2021-09-21 (×8): 120 mL

## 2021-09-18 MED ORDER — BISACODYL 10 MG RE SUPP: 10.0000 mg | Freq: Every day | RECTAL | Status: DC | PRN

## 2021-09-18 MED ORDER — IPRATROPIUM-ALBUTEROL 0.5-2.5 (3) MG/3ML IN SOLN
3.0000 mL | Freq: Four times a day (QID) | RESPIRATORY_TRACT | Status: DC
  Administered 2021-09-18 – 2021-09-19 (×6): 3 mL via RESPIRATORY_TRACT
  Filled 2021-09-18 (×6): qty 3

## 2021-09-18 MED ORDER — IPRATROPIUM-ALBUTEROL 0.5-2.5 (3) MG/3ML IN SOLN: 3.0000 mL | Freq: Four times a day (QID) | RESPIRATORY_TRACT | Status: DC

## 2021-09-18 MED ORDER — SENNOSIDES 8.8 MG/5ML PO SYRP
5.0000 mL | ORAL_SOLUTION | Freq: Two times a day (BID) | ORAL | Status: DC
  Administered 2021-09-18 – 2021-09-24 (×10): 5 mL
  Filled 2021-09-18 (×22): qty 5

## 2021-09-18 MED ORDER — SENNOSIDES-DOCUSATE SODIUM 8.6-50 MG PO TABS
1.0000 | ORAL_TABLET | Freq: Two times a day (BID) | ORAL | Status: DC
  Filled 2021-09-18: qty 1

## 2021-09-18 MED ORDER — DOCUSATE SODIUM 50 MG/5ML PO LIQD
100.0000 mg | Freq: Two times a day (BID) | ORAL | Status: DC
  Administered 2021-09-18 – 2021-09-28 (×17): 100 mg
  Filled 2021-09-18 (×22): qty 10
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
## 2021-09-18 NOTE — Progress Notes (Signed)
Occupational Therapy Treatment Patient Details Name: ELINOR KLEINE MRN: [id number] DOB: [birth year] Today's Date: 09/18/2021   History of present illness 65yo female who presented on 11/22 after having palpitations while at [facility] center; found to be in Afib with RVR and transferred to ED. PE negative but did have pleural effusion, addressed with thoracentesis on 11/23. 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 Once in supine, SpO2: 100% and HR: 107. After supine rest, pt declined to attempt sitting again.  Pt declined ADLs as well and required Moderate assist for supine to sit and Max Assist for sit to supine.  Static sitting balance with Fair with BUE support and pt was able to scoot to EOB with Mod As. Pt declined a standing attempt today.  Pt remains limited by PEG tube insertion site and back pain, shortness of breath, profound weakness and impaired activity tolerance. Pt with supportive niece in room and left with no further requests. Pt continues to demonstrate fair rehab potential and may benefit from continued skilled OT as pt tolerates to increase safety and independence with ADLs and functional transfers to allow pt to return home safely and reduce caregiver burden and fall risk.    Recommendations for follow up therapy are one component of a multi-disciplinary discharge planning process, led by the attending physician.  Recommendations may be updated based on patient status, additional functional criteria and insurance authorization.    Follow Up Recommendations  Skilled nursing-short term rehab (<3 hours/day)    Assistance Recommended at Discharge    Equipment Recommendations  BSC/3in1;Wheelchair (measurements OT);Hospital bed     Recommendations for Other Services      Precautions / Restrictions Precautions Precautions: Fall Precaution Comments: high fracture risk due CA, L LE pain, watch vitals Restrictions Weight Bearing Restrictions: No       Mobility Bed Mobility Overal bed mobility: Needs Assistance Bed Mobility: Supine to Sit;Sit to Supine Rolling: Mod assist Sidelying to sit: Mod assist   Sit to supine: Max assist        Transfers                   General transfer comment: Pt declined to try stand this visit.     Balance Overall balance assessment: Needs assistance Sitting-balance support: Feet supported;Bilateral upper extremity supported Sitting balance-Leahy Scale: Fair Sitting balance - Comments: No LOB but limited tolerance to sitting EOB <2 min                                   ADL either performed or assessed with clinical judgement   ADL Overall ADL's :  (Pt declined all ADLs activities. Pt did agree to recreational activity at EOB with word find puzzles.)                                            Extremity/Trunk Assessment Upper Extremity Assessment Upper Extremity Assessment: Generalized weakness       Cervical / Trunk Assessment Cervical / Trunk Assessment: Normal    Vision  Vision Assessment?: No apparent visual deficits   Perception     Praxis      Cognition Arousal/Alertness: Awake/alert Behavior During Therapy: WFL for tasks assessed/performed Overall Cognitive Status: Within Functional Limits for tasks assessed                                            Exercises     Shoulder Instructions       General Comments      Pertinent Vitals/ Pain       Pain Assessment: PAINAD Faces Pain Scale: Hurts even more Breathing: occasional labored breathing, short period of hyperventilation Negative Vocalization: occasional moan/groan, low speech, negative/disapproving quality Facial Expression:  facial grimacing Body Language: tense, distressed pacing, fidgeting Consolability: distracted or reassured by voice/touch PAINAD Score: 6 Pain Location: PEG tube site, L LE Pain Descriptors / Indicators: Discomfort;Sore;Grimacing Pain Intervention(s): Limited activity within patient's tolerance;Monitored during session;Repositioned  Home Living                                          Prior Functioning/Environment              Frequency  Min 2X/week        Progress Toward Goals  OT Goals(current goals can now be found in the care plan section)  Progress towards OT goals: Not progressing toward goals - comment (Goals recently downgraded last OT session to reflect pt's current needs)  Acute Rehab OT Goals Patient Stated Goal: To be able to do more without shortness of breath. OT Goal Formulation: With patient/family Time For Goal Achievement: 09/30/21 Potential to Achieve Goals: [place] Discharge plan remains appropriate    Co-evaluation                 AM-PAC OT "6 Clicks" Daily Activity     Outcome Measure   Help from another person eating meals?: A Little Help from another person taking care of personal grooming?: A Little Help from another person toileting, which includes using toliet, bedpan, or urinal?: Total Help from another person bathing (including washing, rinsing, drying)?: A Lot Help from another person to put on and taking off regular upper body clothing?: A Little Help from another person to put on and taking off regular lower body clothing?: Total 6 Click Score: 13    End of Session    OT Visit Diagnosis: Unsteadiness on feet (R26.81);Muscle weakness (generalized) (M62.81);History of falling (Z91.81);Pain   Activity Tolerance Patient limited by fatigue   Patient Left in bed;with family/visitor present;with call bell/phone within reach;with bed alarm set   Nurse Communication          Time: 4081-4481 OT Time  Calculation (min): 21 min  Charges: OT General Charges $OT Visit: 1 Visit OT Treatments $Therapeutic Activity: 8-22 mins  Anderson [place], [place] Office: [phone] 09/18/2021  Julien Girt 09/18/2021, 11:38 AM

## 2021-09-18 NOTE — Progress Notes (Signed)
Nutrition Follow-up  DOCUMENTATION CODES:   Severe malnutrition in context of chronic illness  INTERVENTION:  - will adjust TF regimen: 1 carton (237 ml) Osmolite 1.5 x5/day with 60 ml free water before and 60 ml free water after each TF bolus.  - this regimen will provide 1775 kcal (84% kcal need), 74 grams protein (92% protein need), and 1505 ml free water.  - continue Ensure Enlive TID.    NUTRITION DIAGNOSIS:   Severe Malnutrition related to chronic illness, cancer and cancer related treatments as evidenced by severe fat depletion, severe muscle depletion. -ongoing  GOAL:   Patient will meet greater than or equal to 90% of their needs -met with TF regimen and minimal PO intake  MONITOR:   PO intake, TF tolerance, Supplement acceptance, Labs, Weight trends, Skin   ASSESSMENT:   65 yo female with a PMH of stage IV non-small cell lung cancer, diabetes mellitus type 2, hyperlipidemia who has been recently diagnosed with lung cancer receiving chemotherapy was found to be having palpitations and found to be in A. fib with RVR at the chemotherapy infusion center and was referred to the ER.  Patient states over the last 2 months patient has been having intermittent episodes of palpitations with some chest pressure.  Also has been chronically short of breath. Admitted with A-fib with RVR.  Able to talk with RN and MD via  secure chat. Patient has not had a BM in 1 week and orders to be placed concerning this.   Diet advanced from NPO to Dysphagia 3, thin liquids on 12/13 at 1220. No intakes documented since that date. Review of order indicates she has accepted all but 1 bottle of Ensure since order placed on 12/13.   PEG placed by IR on 12/12. She is receiving TF regimen at goal rate: Osmolite 1.5 @ 60 ml/hr with 100 ml free water every 4 hours. This regimen is providing 2160 kcal, 90 grams protein, and 1697 ml free water.   Request made to transition to bolus TF regimen for preparation  for d/c to SNF. MD note from today indicates that SNF has reported patient cannot be on chemo during rehab.   Weight yesterday was +6 lb compared to weight on 12/13. Moderate pitting edema to LLE documented in the edema section of flow sheet.     Labs reviewed; CBGs: 112 and 138 mg/dl, creatinine: 0.37 mg/dl, Ca: 7.7 mg/dl, Mg: 1.5 mg/dl.  Medications reviewed; 1 mg folvite/day, sliding scale novolog, 800 mg mag-ox/day, 3 mg melatonin/night, 15 ml multivitamin/day, 40 mg protonix BID, 17 g miralax/day, 30 mmol IV KPhos x1 run 12/15.   Diet Order:   Diet Order             DIET DYS 3 Room service appropriate? Yes; Fluid consistency: Thin  Diet effective now                   EDUCATION NEEDS:   Education needs have been addressed  Skin:  Skin Assessment: Skin Integrity Issues: Skin Integrity Issues:: Stage II Stage II: Coccyx  Last BM:  12/9 (type 7)  Height:   Ht Readings from Last 1 Encounters:  09/01/21 $RemoveB'5\' 5"'meUiWznw$  (1.651 m)    Weight:   Wt Readings from Last 1 Encounters:  09/17/21 55.3 kg     Estimated Nutritional Needs:  Kcal:  2100-2300 Protein:  80-95 grams Fluid:  >2.1 L      Jarome Matin, MS, RD, LDN, CNSC Inpatient Clinical Dietitian RD  pager # available in [place]  After hours/weekend pager # available in [facility]

## 2021-09-18 NOTE — TOC Progression Note (Addendum)
Transition of Care [facility]) - Progression Note    Patient Details  Name: Kim Ross MRN: [id number] Date of Birth: 1956/04/08  [facility] CM/SW Contact  Chantae Soo, Juliann Pulse, RN Phone Number: 09/18/2021, 1:28 PM  Clinical Narrative: No bed offers. Starting bolus TF.Noted only has medicare part A-SNF not covered  Will continue to explore SNF's that may be able to accept-Contacted [facility]-rep [place] to review to review-await response.    Expected Discharge Plan: [place] Barriers to Discharge: Continued Medical Work up  Expected Discharge Plan and Services Expected Discharge Plan: [place]   Discharge Planning Services: CM Consult   Living arrangements for the past 2 months: Single Family Home                                       Social Determinants of Health (SDOH) Interventions    Readmission Risk Interventions Readmission Risk Prevention Plan 09/18/2021  Transportation Screening Complete  Medication Review [employer]) Complete  PCP or Specialist appointment within 3-5 days of discharge Complete  HRI or Home Care Consult Complete  Palliative Care Screening Complete  Skilled Nursing Facility Complete  Some recent data might be hidden

## 2021-09-19 LAB — BASIC METABOLIC PANEL
Anion gap: 7 (ref 5–15)
BUN: 9 mg/dL (ref 8–23)
CO2: 31 mmol/L (ref 22–32)
Calcium: 7.5 mg/dL — ABNORMAL LOW (ref 8.9–10.3)
Chloride: 97 mmol/L — ABNORMAL LOW (ref 98–111)
Creatinine, Ser: 0.31 mg/dL — ABNORMAL LOW (ref 0.44–1.00)
GFR, Estimated: >60 mL/min (ref >60)
Glucose, Bld: 71 mg/dL (ref 70–99)
Potassium: 4.2 mmol/L (ref 3.5–5.1)
Sodium: 135 mmol/L (ref 135–145)

## 2021-09-19 LAB — CBC
HCT: 26.4 % — ABNORMAL LOW (ref 36.0–46.0)
Hemoglobin: 8.3 g/dL — ABNORMAL LOW (ref 12.0–15.0)
MCH: 29.1 pg (ref 26.0–34.0)
MCHC: 31.4 g/dL (ref 30.0–36.0)
MCV: 92.6 fL (ref 80.0–100.0)
Platelets: 233 10*3/uL (ref 150–400)
RBC: 2.85 MIL/uL — ABNORMAL LOW (ref 3.87–5.11)
RDW: 18.6 % — ABNORMAL HIGH (ref 11.5–15.5)
WBC: 22.5 10*3/uL — ABNORMAL HIGH (ref 4.0–10.5)
nRBC: 0.2 % (ref 0.0–0.2)

## 2021-09-19 LAB — GLUCOSE, CAPILLARY
Glucose-Capillary: 83 mg/dL (ref 70–99)
Glucose-Capillary: 128 mg/dL — ABNORMAL HIGH (ref 70–99)
Glucose-Capillary: 91 mg/dL (ref 70–99)
Glucose-Capillary: 79 mg/dL (ref 70–99)
Glucose-Capillary: 147 mg/dL — ABNORMAL HIGH (ref 70–99)
Glucose-Capillary: 96 mg/dL (ref 70–99)

## 2021-09-19 MED ORDER — DEXTROSE-NACL 5-0.9 % IV SOLN: INTRAVENOUS | Status: DC

## 2021-09-19 MED ORDER — IPRATROPIUM-ALBUTEROL 0.5-2.5 (3) MG/3ML IN SOLN
3.0000 mL | Freq: Two times a day (BID) | RESPIRATORY_TRACT | Status: DC
  Administered 2021-09-20: 09:00:00 3 mL via RESPIRATORY_TRACT
  Filled 2021-09-19: qty 3

## 2021-09-19 MED ORDER — DEXTROSE 50 % IV SOLN
INTRAVENOUS | Status: AC
  Filled 2021-09-19: qty 50
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
## 2021-09-19 NOTE — Progress Notes (Signed)
PT Cancellation Note  Patient Details Name: Kim Ross MRN: [id number] DOB: October 07, 1955   Cancelled Treatment:    Reason Eval/Treat Not Completed:  Attempted PT tx session-pt declined participation on today. She stated she had a rough night and that she was currently feeling shaky. Will check back another day as schedule allows.    [facility]  Office: [phone] Pager: [phone]

## 2021-09-19 NOTE — Progress Notes (Signed)
CBG 78, symptomatic, diaphoretic, dizzy, she has not been able to tolerate more than 50-75 cc TF at a time. D 50 (25 cc) given CBG 140's now. Provider contacted with update, see orders.

## 2021-09-19 NOTE — Progress Notes (Signed)
This am prior to day shift arrival, Patient had emesis episode x 1. Per RN night report episode occurred shortly after Peg bolus feeding. At 755 the patient had a second episode emesis, 100 cc. The pt complains of nausea. Zofran administered at 0801. The pt reports feeling better During 11 am feeding the patient reports feeling full. Feeding stopped. Pt was able to tolerate 75 cc. Will continue to monitor

## 2021-09-20 LAB — GLUCOSE, CAPILLARY
Glucose-Capillary: 114 mg/dL — ABNORMAL HIGH (ref 70–99)
Glucose-Capillary: 115 mg/dL — ABNORMAL HIGH (ref 70–99)
Glucose-Capillary: 127 mg/dL — ABNORMAL HIGH (ref 70–99)
Glucose-Capillary: 128 mg/dL — ABNORMAL HIGH (ref 70–99)
Glucose-Capillary: 132 mg/dL — ABNORMAL HIGH (ref 70–99)
Glucose-Capillary: 82 mg/dL (ref 70–99)

## 2021-09-20 LAB — BASIC METABOLIC PANEL
Anion gap: 9 (ref 5–15)
BUN: 8 mg/dL (ref 8–23)
CO2: 29 mmol/L (ref 22–32)
Calcium: 7.5 mg/dL — ABNORMAL LOW (ref 8.9–10.3)
Chloride: 97 mmol/L — ABNORMAL LOW (ref 98–111)
Creatinine, Ser: 0.39 mg/dL — ABNORMAL LOW (ref 0.44–1.00)
GFR, Estimated: >60 mL/min (ref >60)
Glucose, Bld: 115 mg/dL — ABNORMAL HIGH (ref 70–99)
Potassium: 3.9 mmol/L (ref 3.5–5.1)
Sodium: 135 mmol/L (ref 135–145)

## 2021-09-20 LAB — CBC
HCT: 27.1 % — ABNORMAL LOW (ref 36.0–46.0)
Hemoglobin: 8.6 g/dL — ABNORMAL LOW (ref 12.0–15.0)
MCH: 29 pg (ref 26.0–34.0)
MCHC: 31.7 g/dL (ref 30.0–36.0)
MCV: 91.2 fL (ref 80.0–100.0)
Platelets: 306 10*3/uL (ref 150–400)
RBC: 2.97 MIL/uL — ABNORMAL LOW (ref 3.87–5.11)
RDW: 19.1 % — ABNORMAL HIGH (ref 11.5–15.5)
WBC: 25.9 10*3/uL — ABNORMAL HIGH (ref 4.0–10.5)
nRBC: 0.3 % — ABNORMAL HIGH (ref 0.0–0.2)

## 2021-09-20 LAB — PHOSPHORUS: Phosphorus: 3.1 mg/dL (ref 2.5–4.6)

## 2021-09-20 LAB — MAGNESIUM: Magnesium: 1.2 mg/dL — ABNORMAL LOW (ref 1.7–2.4)

## 2021-09-20 MED ORDER — MAGNESIUM SULFATE 4 GM/100ML IV SOLN
4.0000 g | Freq: Once | INTRAVENOUS | Status: AC
  Administered 2021-09-20: 11:00:00 4 g via INTRAVENOUS
  Filled 2021-09-20: qty 100

## 2021-09-20 MED ORDER — IPRATROPIUM-ALBUTEROL 0.5-2.5 (3) MG/3ML IN SOLN: 3.0000 mL | Freq: Four times a day (QID) | RESPIRATORY_TRACT | Status: DC | PRN

## 2021-09-20 NOTE — Progress Notes (Signed)
ADM: A-FIB WITH RVR HOME REGIMEN: NONE CURRENT THERAPY: DUONEB BID PLAN: DUONEPRN ASSESSMENT: NO WOB, NO SOB, SATS 94% ON I LPMNC, BS CL/DIM MEDICAL  HX: LUNG CANCER, DIABETES X-RAY: CT LEFT SIDED LOCULATED PLEURAL EFFUSION RT CHANGED THERAPY BASED ON PT CONDITION AND X-RAY.
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
## 2021-09-20 NOTE — Progress Notes (Signed)
ANTICOAGULATION CONSULT NOTE   Pharmacy Consult for Lovenox Indication: DVT  Allergies  Allergen Reactions   Ibuprofen Nausea And Vomiting   Trazodone And Nefazodone     AM Dizziness & Drowsiness     Patient Measurements: Height: 5\' 5"  (165.1 cm) Weight: 56.4 kg (124 lb 5.4 oz) IBW/kg (Calculated) : 57  Vital Signs: Temp: 97.8 F (36.6 C) (12/18 0234) Temp Source: Oral (12/18 0234) BP: 165/95 (12/18 0234) Pulse Rate: 123 (12/18 0234)  Labs: Recent Labs    09/19/21 0529 09/20/21 0458  HGB 8.3* 8.6*  HCT 26.4* 27.1*  PLT 233 306  CREATININE 0.31* 0.39*     Estimated Creatinine Clearance: 64.1 mL/min (A) (by C-G formula based on SCr of 0.39 mg/dL (L)).   Medical History: Past Medical History:  Diagnosis Date   Acute pain of left shoulder 12/01/2020   Anxiety    Aspiration pneumonitis ([place]) 08/05/2019   Breast cancer screening 12/30/2017   Chest wall pain 11/05/2019   Cocaine abuse ([place])    Depression    Drug intoxication ([place]) 08/05/2019   Encounter for screening for HIV 08/05/2019   Goiter 09/05/2019   History of radiation therapy    Brain, left chest 07/13/21-08/04/21- Dr. Gery Pray   History of recurrent UTIs    No-show for appointment 09/24/2020   Pneumonia    Pre-diabetes    Urinary frequency 04/10/2013    Medications:  Scheduled:   amiodarone  200 mg Per Tube Daily   atorvastatin  40 mg Per Tube Daily   Chlorhexidine Gluconate Cloth  6 each Topical Q0600   dicyclomine  10 mg Per Tube TID AC & HS   docusate  100 mg Per Tube BID   And   sennosides  5 mL Per Tube BID   enoxaparin (LOVENOX) injection  1 mg/kg Subcutaneous Q12H   feeding supplement  237 mL Per Tube TID BM   feeding supplement (OSMOLITE 1.5 CAL)  237 mL Per Tube 5 X Daily   fentaNYL  1 patch Transdermal A35T   folic acid  1 mg Per Tube Daily   free water  120 mL Per Tube 5 X Daily   insulin aspart  0-9 Units Subcutaneous TID WC   ipratropium-albuterol  3 mL Nebulization BID    magic mouthwash  5 mL Oral QID   magnesium oxide  800 mg Per Tube Daily   mouth rinse  15 mL Mouth Rinse BID   melatonin  3 mg Per Tube QHS   midodrine  2.5 mg Per Tube TID WC   mirtazapine  15 mg Oral QHS   multivitamin  15 mL Per Tube QODAY   pantoprazole sodium  40 mg Per Tube BID   polyethylene glycol  17 g Oral Daily   sertraline  50 mg Per Tube Daily   tamsulosin  0.4 mg Oral QPC supper   zinc oxide   Topical TID   Infusions:   sodium chloride Stopped (09/07/21 1015)   dextrose 5 % and 0.9% NaCl 100 mL/hr at 09/20/21 0355   PRN: sodium chloride, bisacodyl, guaiFENesin-dextromethorphan, HYDROcodone-acetaminophen, meclizine, mineral oil-hydrophilic petrolatum, ondansetron (ZOFRAN) IV  Assessment: 65 yo female 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 Patient was started on heparin drip then transitioned to lovenox for afib.  Then was found to have DVT on 08/26/21.  Lovenox was dc'd on 09/02/21 due to thrombocytopenia for which she was given  transfusion on 09/11/21.  Pharmacy was consulted to resume therapeutic LMWH on 09/17/21.  Today, 09/20/21 Renal function WNL and stable. CrCl ~64 mL/min CBC: Hgb low but stable; Plt remains WNL and increasing  Goal of Therapy:  Anti-Xa level 0.6-1 units/ml 4hrs after LMWH dose given Monitor platelets by anticoagulation protocol: Yes   Plan:  Continue enoxaparin 1 mg/kg subQ q12 Monitor renal function, CBC, sign/symptoms of bleeding  Lenis Noon, PharmD 09/20/21 10:03 AM

## 2021-09-21 ENCOUNTER — Ambulatory Visit (HOSPITAL_BASED_OUTPATIENT_CLINIC_OR_DEPARTMENT_OTHER): Payer: 59 | Admitting: Family

## 2021-09-21 ENCOUNTER — Other Ambulatory Visit: Payer: Self-pay

## 2021-09-21 LAB — GLUCOSE, CAPILLARY
Glucose-Capillary: 119 mg/dL — ABNORMAL HIGH (ref 70–99)
Glucose-Capillary: 124 mg/dL — ABNORMAL HIGH (ref 70–99)
Glucose-Capillary: 127 mg/dL — ABNORMAL HIGH (ref 70–99)
Glucose-Capillary: 134 mg/dL — ABNORMAL HIGH (ref 70–99)
Glucose-Capillary: 158 mg/dL — ABNORMAL HIGH (ref 70–99)
Glucose-Capillary: 177 mg/dL — ABNORMAL HIGH (ref 70–99)

## 2021-09-21 MED ORDER — OSMOLITE 1.5 CAL PO LIQD
1000.0000 mL | ORAL | Status: DC
  Administered 2021-09-21 – 2021-09-28 (×7): 1000 mL
  Filled 2021-09-21 (×12): qty 1000

## 2021-09-21 MED ORDER — FREE WATER
100.0000 mL | Status: DC
  Administered 2021-09-21 – 2021-09-28 (×42): 100 mL
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
## 2021-09-21 NOTE — Progress Notes (Signed)
NUTRITION NOTE  Follow-up assessment completed on 12/16. At that time patient was receiving goal rate TF regimen of Osmolite 1.5 @ 60 ml/hr with 100 ml free water every 4 hours without issue.   In anticipation of d/c to a facility patient was changed to bolus TF regimen of 1 carton (237 ml) Osmolite 1.5 x5/day with 60 ml free water before and 60 ml free water after each TF bolus.   RD was able to talked with TOC CM on the phone on 12/16 and she reported that patient would be able to go to a facility on continuous TF regimen.  Secure chat message with RN and MD this AM; patient has been refusing TF boluses or accepting ~120 ml Osmolite 1.5 per bolus d/t persistent N/V with larger volumes.  Communicated with MD and RN plan to transition back to continuous TF regimen and that TOC had reported on 12/16 patient would be able to go to facility on continuous regimen.  Will change TF order to Osmolite 1.5 @ 30 ml/hr to advance by 10 ml every 8 hours to reach goal rate of 60 ml/hr with 100 ml free water every 4 hours.  Will start at half rate and advance slowly to confirm tolerance with transition back to this regimen.   RD will continue to follow per protocol.     Jarome Matin, MS, RD, LDN, CNSC Inpatient Clinical Dietitian RD pager # available in [place]  After hours/weekend pager # available in [facility]

## 2021-09-21 NOTE — Progress Notes (Signed)
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  In the ED, patient was noted to have atrial fibrillation with RVR.  CTA of the chest was performed which showed large left-sided loculated pleural effusion.  COVID-19 PCR was negative.  Patient was started on heparin drip and cardiology was consulted for new onset A. fib with RVR.  Patient was then admitted to the hospital for further evaluation and treatment.     Assessment/Plan:     Principal Problem:    Atrial fibrillation with RVR (HCC)  Active Problems:    Adenocarcinoma of left lung, stage 4 (HCC)    Pleural effusion    A-fib (HCC)    Protein-calorie malnutrition, severe    Pressure injury of skin    Thrombocytopenia (HCC)    Chemotherapy-induced neutropenia (HCC)    PT Comments    Pt in bed on 2 lts sats 96% but resting HR 124.  AxO x 3 with MAX c/o weakness/fatigue.  Spouse of 18 years in room providing encouragement.  "Come on Baby, you can do it". Assisted OOB was very difficult.  General bed mobility comments: pt initiates but unable to complete full task due to prfound weakness.  Required Max Assist for upper body and Total Assist to scoot to EOB. General transfer comment: attempted traditional sit to stand with walker however pt was unable to clear hips off bed.  SPS "Bear Hug" from elevated bed to [facility] as pt was found incont BM.  Pt required + 2 assist off BSC to perform hygiene and assist to recliner.  + 2 Max Assist to scoot to back of recliner. Reported to NT to use STEDY to assist pt back to bed. General Gait Details: unable  to due to poor transfer ability.  Profoundly weak. Positioned in recliner with multiple pillows. Pt will need ST Rehab at SNF prior to returning home.   Recommendations for follow up therapy are one component of a multi-disciplinary discharge planning process, led by the attending physician.  Recommendations may be updated based on patient status, additional functional criteria and insurance authorization.  Follow Up Recommendations  Skilled nursing-short term rehab (<3 hours/day)     Assistance Recommended at Discharge    Equipment Recommendations       Recommendations for Other Services       Precautions / Restrictions Precautions Precautions: Fall Precaution Comments: high fracture risk due CA, L LE pain, watch vitals     Mobility  Bed Mobility Overal bed mobility: Needs Assistance Bed Mobility: Supine to Sit     Supine to sit: Max assist     General bed mobility comments: pt initiates but unable to complete full task due to prfound weakness.  Required Max Assist for upper body and Total Assist to scoot to EOB.    Transfers Overall transfer level: Needs assistance   Transfers: Bed to chair/wheelchair/BSC   Stand pivot transfers: Max assist         General transfer comment: attempted traditional sit to stand with walker however pt was unable to clear hips off bed.  SPS "Bear Hug" from elevated bed to [facility] as pt was found incont BM.  Pt required + 2 assist off BSC to perform hygiene and assist to recliner.  + 2 Max Assist to scoot to back of recliner. Reported to NT to use STEDY to assist pt back to bed.    Ambulation/Gait               General Gait Details: unable to due to poor transfer ability.  Profoundly weak.   Stairs             Wheelchair Mobility    Modified Rankin (Stroke Patients Only)       Balance                                            Cognition Arousal/Alertness: Awake/alert Behavior During Therapy: WFL  for tasks assessed/performed Overall Cognitive Status: Within Functional Limits for tasks assessed                                 General Comments: AxO x 3 very ill and weak.  Spouse of 18 years in room providing encouragement.  "Come on Baby, you can do it".        Exercises      General Comments        Pertinent Vitals/Pain Pain Assessment: Faces Faces Pain Scale: Hurts little more Pain Location: PEG tube site, L LE Pain Descriptors / Indicators: Discomfort;Sore;Grimacing    Home Living                          Prior Function            PT Goals (current goals can now be found in the care plan section) Progress towards PT goals: Progressing toward goals    Frequency    Min 3X/week      PT Plan Current plan remains appropriate    Co-evaluation              AM-PAC PT "6 Clicks" Mobility   Outcome Measure  Help needed turning from your back to your side while in a flat bed without using bedrails?: A Lot Help needed moving from lying on your back to sitting on the side of a flat bed without using bedrails?: A Lot Help needed moving to and from a bed to a chair (including a wheelchair)?: A Lot Help needed standing up from a chair using your arms (e.g., wheelchair or bedside chair)?: A Lot Help needed to walk in hospital room?: Total Help needed climbing 3-5 steps with a railing? : Total 6 Click Score: 10    End of Session Equipment Utilized During Treatment: Gait belt Activity Tolerance: Patient limited by fatigue Patient left: in chair;with call bell/phone within reach Nurse Communication: Mobility status (RN was in room) PT Visit Diagnosis: Muscle weakness (generalized) (M62.81);Pain;Difficulty in walking, not elsewhere classified (R26.2)     Time: 1550-1620 PT Time Calculation (min) (ACUTE ONLY): 30 min  Charges:  $Therapeutic Activity: 23-37 mins                     Rica Koyanagi  PTA Acute  Rehabilitation  Services Pager      [phone] Office      [phone]

## 2021-09-21 NOTE — Progress Notes (Signed)
Pt is unable to tolerate total bolus tube feeds. Takes partial or refuses total. Educated patient. Pt states she has had multiple episodes of vomiting and nausea when taking total feed.

## 2021-09-22 ENCOUNTER — Inpatient Hospital Stay (HOSPITAL_COMMUNITY)
Admit: 2021-09-22 | Discharge: 2021-09-22 | Disposition: A | Discharge status: Home / Self Care | Payer: 59 | Attending: Radiology | Admitting: Radiology

## 2021-09-22 LAB — GLUCOSE, CAPILLARY
Glucose-Capillary: 110 mg/dL — ABNORMAL HIGH (ref 70–99)
Glucose-Capillary: 112 mg/dL — ABNORMAL HIGH (ref 70–99)
Glucose-Capillary: 125 mg/dL — ABNORMAL HIGH (ref 70–99)
Glucose-Capillary: 126 mg/dL — ABNORMAL HIGH (ref 70–99)
Glucose-Capillary: 134 mg/dL — ABNORMAL HIGH (ref 70–99)
Glucose-Capillary: 141 mg/dL — ABNORMAL HIGH (ref 70–99)
Glucose-Capillary: 145 mg/dL — ABNORMAL HIGH (ref 70–99)

## 2021-09-22 LAB — CBC
HCT: 26.7 % — ABNORMAL LOW (ref 36.0–46.0)
Hemoglobin: 8.3 g/dL — ABNORMAL LOW (ref 12.0–15.0)
MCH: 29.2 pg (ref 26.0–34.0)
MCHC: 31.1 g/dL (ref 30.0–36.0)
MCV: 94 fL (ref 80.0–100.0)
Platelets: 384 10*3/uL (ref 150–400)
RBC: 2.84 MIL/uL — ABNORMAL LOW (ref 3.87–5.11)
RDW: 19.5 % — ABNORMAL HIGH (ref 11.5–15.5)
WBC: 31.4 10*3/uL — ABNORMAL HIGH (ref 4.0–10.5)
nRBC: 0.2 % (ref 0.0–0.2)

## 2021-09-22 LAB — BASIC METABOLIC PANEL
Anion gap: 7 (ref 5–15)
BUN: 8 mg/dL (ref 8–23)
CO2: 29 mmol/L (ref 22–32)
Calcium: 7.8 mg/dL — ABNORMAL LOW (ref 8.9–10.3)
Chloride: 101 mmol/L (ref 98–111)
Creatinine, Ser: 0.32 mg/dL — ABNORMAL LOW (ref 0.44–1.00)
GFR, Estimated: >60 mL/min (ref >60)
Glucose, Bld: 125 mg/dL — ABNORMAL HIGH (ref 70–99)
Potassium: 3.7 mmol/L (ref 3.5–5.1)
Sodium: 137 mmol/L (ref 135–145)

## 2021-09-22 LAB — MAGNESIUM: Magnesium: 1.6 mg/dL — ABNORMAL LOW (ref 1.7–2.4)

## 2021-09-22 MED ORDER — POTASSIUM CHLORIDE 20 MEQ PO PACK
40.0000 meq | PACK | Freq: Once | ORAL | Status: AC
  Administered 2021-09-22: 15:00:00 40 meq
  Filled 2021-09-22: qty 2

## 2021-09-22 MED ORDER — MAGNESIUM SULFATE 2 GM/50ML IV SOLN
2.0000 g | Freq: Once | INTRAVENOUS | Status: AC
  Administered 2021-09-22: 09:00:00 2 g via INTRAVENOUS
  Filled 2021-09-22: qty 50

## 2021-09-22 NOTE — TOC Progression Note (Addendum)
Transition of Care [facility]) - Progression Note    Patient Details  Name: Kim Ross MRN: [id number] Date of Birth: April 27, 1956  Transition of [facility] CM/SW Contact  Ross Ludwig, [place] Phone Number: 09/22/2021, 11:18 AM  Clinical Narrative:     CSW contacted [facility] and [facility] to review patient to see if she may be a good candidate.  2:30pm  [facility] declined patient and said she seems more SNF appropriate.  Still waiting to hear back from [facility].  Expected Discharge Plan: [place] Barriers to Discharge: Continued Medical Work up  Expected Discharge Plan and Services Expected Discharge Plan: [place]   Discharge Planning Services: CM Consult   Living arrangements for the past 2 months: Single Family Home                                       Social Determinants of Health (SDOH) Interventions    Readmission Risk Interventions Readmission Risk Prevention Plan 09/18/2021  Transportation Screening Complete  Medication Review [employer]) Complete  PCP or Specialist appointment within 3-5 days of discharge Complete  HRI or Home Care Consult Complete  Palliative Care Screening Complete  Skilled Nursing Facility Complete  Some recent data might be hidden
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
## 2021-09-22 NOTE — Progress Notes (Signed)
66 y.o. female seen at bedside at [facility]  for t-fasteners removal. Family at bedside is accompanied  by his wife. History of dysphagia, malnutrition and lung cancer. A 14 Fr push in  gastrostomy tube placed by IR on 12.12.22 for nutritional access. Independently the external suture of the t- fastener was cut and the remaining two t-fasteners were removed successfully without incident. Skin site unremarkable with no erythema, tenderness or drainage noted. A clean dressing was applied around the gastrotomy tube exit site.    Patient verbalized understanding and all questions were answered at this time. Bedside RN requested to replace the dressing/

## 2021-09-23 ENCOUNTER — Inpatient Hospital Stay: Payer: 59

## 2021-09-23 ENCOUNTER — Inpatient Hospital Stay: Payer: 59 | Admitting: Physician Assistant

## 2021-09-23 LAB — GLUCOSE, CAPILLARY
Glucose-Capillary: 144 mg/dL — ABNORMAL HIGH (ref 70–99)
Glucose-Capillary: 129 mg/dL — ABNORMAL HIGH (ref 70–99)
Glucose-Capillary: 136 mg/dL — ABNORMAL HIGH (ref 70–99)
Glucose-Capillary: 108 mg/dL — ABNORMAL HIGH (ref 70–99)
Glucose-Capillary: 99 mg/dL (ref 70–99)

## 2021-09-23 LAB — MAGNESIUM: Magnesium: 1.6 mg/dL — ABNORMAL LOW (ref 1.7–2.4)

## 2021-09-23 LAB — CBC
HCT: 23.9 % — ABNORMAL LOW (ref 36.0–46.0)
Hemoglobin: 7.7 g/dL — ABNORMAL LOW (ref 12.0–15.0)
MCH: 30 pg (ref 26.0–34.0)
MCHC: 32.2 g/dL (ref 30.0–36.0)
MCV: 93 fL (ref 80.0–100.0)
Platelets: 380 10*3/uL (ref 150–400)
RBC: 2.57 MIL/uL — ABNORMAL LOW (ref 3.87–5.11)
RDW: 19.9 % — ABNORMAL HIGH (ref 11.5–15.5)
WBC: 28 10*3/uL — ABNORMAL HIGH (ref 4.0–10.5)
nRBC: 0.3 % — ABNORMAL HIGH (ref 0.0–0.2)

## 2021-09-23 LAB — BASIC METABOLIC PANEL
Anion gap: 5 (ref 5–15)
BUN: 10 mg/dL (ref 8–23)
CO2: 29 mmol/L (ref 22–32)
Calcium: 7.4 mg/dL — ABNORMAL LOW (ref 8.9–10.3)
Chloride: 101 mmol/L (ref 98–111)
Creatinine, Ser: 0.3 mg/dL — ABNORMAL LOW (ref 0.44–1.00)
GFR, Estimated: >60 mL/min (ref >60)
Glucose, Bld: 154 mg/dL — ABNORMAL HIGH (ref 70–99)
Potassium: 4.1 mmol/L (ref 3.5–5.1)
Sodium: 135 mmol/L (ref 135–145)

## 2021-09-23 LAB — PHOSPHORUS: Phosphorus: 2.4 mg/dL — ABNORMAL LOW (ref 2.5–4.6)

## 2021-09-23 LAB — HEMOGLOBIN AND HEMATOCRIT, BLOOD
HCT: 26.3 % — ABNORMAL LOW (ref 36.0–46.0)
Hemoglobin: 8.3 g/dL — ABNORMAL LOW (ref 12.0–15.0)

## 2021-09-23 MED ORDER — MAGNESIUM SULFATE 2 GM/50ML IV SOLN: 2.0000 g | Freq: Once | INTRAVENOUS | Status: DC

## 2021-09-23 MED ORDER — MAGNESIUM SULFATE 4 GM/100ML IV SOLN
4.0000 g | Freq: Once | INTRAVENOUS | Status: AC
  Administered 2021-09-23: 10:00:00 4 g via INTRAVENOUS
  Filled 2021-09-23: qty 100

## 2021-09-23 MED ORDER — ENOXAPARIN SODIUM 60 MG/0.6ML IJ SOSY
1.0000 mg/kg | PREFILLED_SYRINGE | Freq: Two times a day (BID) | INTRAMUSCULAR | Status: DC
  Administered 2021-09-23 – 2021-09-28 (×11): 60 mg via SUBCUTANEOUS
  Filled 2021-09-23 (×11): qty 0.6
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
## 2021-09-23 NOTE — Progress Notes (Signed)
ANTICOAGULATION CONSULT NOTE   Pharmacy Consult for Lovenox Indication: DVT  Allergies  Allergen Reactions   Ibuprofen Nausea And Vomiting   Trazodone And Nefazodone     AM Dizziness & Drowsiness     Patient Measurements: Height: 5\' 5"  (165.1 cm) Weight: 59.7 kg (131 lb 9.8 oz) IBW/kg (Calculated) : 57  Vital Signs: Temp: 98.3 F (36.8 C) (12/21 0552) Temp Source: Oral (12/21 0552) BP: 127/68 (12/21 0552) Pulse Rate: 105 (12/21 0552)  Labs: Recent Labs    09/22/21 0449 09/23/21 0514  HGB 8.3* 7.7*  HCT 26.7* 23.9*  PLT 384 380  CREATININE 0.32* 0.30*     Estimated Creatinine Clearance: 64.8 mL/min (A) (by C-G formula based on SCr of 0.3 mg/dL (L)).   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 Gery Pray   History of recurrent UTIs    No-show for appointment 09/24/2020   Pneumonia    Pre-diabetes    Urinary frequency 04/10/2013    Medications:  Scheduled:   amiodarone  200 mg Per Tube Daily   atorvastatin  40 mg Per Tube Daily   Chlorhexidine Gluconate Cloth  6 each Topical Q0600   dicyclomine  10 mg Per Tube TID AC & HS   docusate  100 mg Per Tube BID   And   sennosides  5 mL Per Tube BID   enoxaparin (LOVENOX) injection  1 mg/kg Subcutaneous Q12H   feeding supplement  237 mL Per Tube TID BM   fentaNYL  1 patch Transdermal D14H   folic acid  1 mg Per Tube Daily   free water  100 mL Per Tube Q4H   insulin aspart  0-9 Units Subcutaneous TID WC   magic mouthwash  5 mL Oral QID   magnesium oxide  800 mg Per Tube Daily   mouth rinse  15 mL Mouth Rinse BID    melatonin  3 mg Per Tube QHS   midodrine  2.5 mg Per Tube TID WC   mirtazapine  15 mg Oral QHS   multivitamin  15 mL Per Tube QODAY   pantoprazole sodium  40 mg Per Tube BID   polyethylene glycol  17 g Oral Daily   sertraline  50 mg Per Tube Daily   tamsulosin  0.4 mg Oral QPC supper   zinc oxide   Topical TID   Infusions:   sodium chloride Stopped (09/07/21 1015)   feeding supplement (OSMOLITE 1.5 CAL) 1,000 mL (09/22/21 2008)   magnesium sulfate bolus IVPB     PRN: sodium chloride, bisacodyl, guaiFENesin-dextromethorphan, HYDROcodone-acetaminophen, ipratropium-albuterol, meclizine, mineral oil-hydrophilic petrolatum, ondansetron (ZOFRAN) IV  Assessment: 65 yo female 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 Patient was started on heparin drip then transitioned to lovenox for afib.  Then was found to have DVT on 08/26/21.  Lovenox was dc'd on 09/02/21 due to thrombocytopenia for which she was given transfusion on 09/11/21.  Pharmacy was consulted to resume therapeutic LMWH on 09/17/21.  Today, 09/23/21 Renal function  WNL and stable. CrCl ~64 mL/min CBC: Hgb low and decreased to 7.7.  Plt remains WNL at upper end of range.  No s/s bleeding or complications reported.    Goal of Therapy:  Anti-Xa level 0.6-1 units/ml 4hrs after LMWH dose given Monitor platelets by anticoagulation protocol: Yes   Plan:  Continue enoxaparin 1 mg/kg subQ q12 Monitor renal function, CBC, sign/symptoms of bleeding Dosage remains stable and need for further dosage adjustment appears unlikely at present.  Pharmacy will sign off at this time but will monitor peripherally for renal dosing needs.      Gretta Arab PharmD, BCPS Clinical Pharmacist [facility] main pharmacy [phone] 09/23/2021 8:17 AM

## 2021-09-23 NOTE — Progress Notes (Signed)
OT Cancellation Note  Patient Details Name: Kim Ross MRN: [id number] DOB: 1955-12-03   Cancelled Treatment:     Pt declined therapy on this date, stating she is too tired for mobility/ADL tasks when prompted. Pt wishes respected, will reattempt as able.  Shanon Payor, OTD OTR/L  09/23/21, 3:18 PM

## 2021-09-23 NOTE — Progress Notes (Signed)
The patient is stable. With minimal activity patient experiences dyspnea and becomes diaphoretic. Will continue to monitor and update provider.

## 2021-09-24 LAB — CBC
HCT: 25.4 % — ABNORMAL LOW (ref 36.0–46.0)
Hemoglobin: 7.9 g/dL — ABNORMAL LOW (ref 12.0–15.0)
MCH: 29.2 pg (ref 26.0–34.0)
MCHC: 31.1 g/dL (ref 30.0–36.0)
MCV: 93.7 fL (ref 80.0–100.0)
Platelets: 448 10*3/uL — ABNORMAL HIGH (ref 150–400)
RBC: 2.71 MIL/uL — ABNORMAL LOW (ref 3.87–5.11)
RDW: 20.6 % — ABNORMAL HIGH (ref 11.5–15.5)
WBC: 27.4 10*3/uL — ABNORMAL HIGH (ref 4.0–10.5)
nRBC: 0.3 % — ABNORMAL HIGH (ref 0.0–0.2)

## 2021-09-24 LAB — GLUCOSE, CAPILLARY
Glucose-Capillary: 114 mg/dL — ABNORMAL HIGH (ref 70–99)
Glucose-Capillary: 115 mg/dL — ABNORMAL HIGH (ref 70–99)
Glucose-Capillary: 115 mg/dL — ABNORMAL HIGH (ref 70–99)
Glucose-Capillary: 127 mg/dL — ABNORMAL HIGH (ref 70–99)
Glucose-Capillary: 143 mg/dL — ABNORMAL HIGH (ref 70–99)
Glucose-Capillary: 89 mg/dL (ref 70–99)
Glucose-Capillary: 95 mg/dL (ref 70–99)

## 2021-09-24 LAB — MAGNESIUM: Magnesium: 1.8 mg/dL (ref 1.7–2.4)

## 2021-09-24 LAB — PHOSPHORUS: Phosphorus: 2 mg/dL — ABNORMAL LOW (ref 2.5–4.6)

## 2021-09-24 NOTE — Progress Notes (Signed)
OT Cancellation Note  Patient Details Name: Kim Ross MRN: [id number] DOB: 1956-03-31   Cancelled Treatment:    Reason Eval/Treat Not Completed: Medical issues which prohibited therapy: Pt declined OT today due to ongoing diarrhea. Pt reassured that therapy is prepared and trained in assisting pt with this and helping pt with hygiene. Pt agreed that she would try tomorrow, and pt's RN into room and agreed that holding off today is a good idea. Will continue efforts next service date.   Julien Girt 09/24/2021, 2:23 PM
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
## 2021-09-24 NOTE — TOC Progression Note (Signed)
Transition of Care [facility]) - Progression Note    Patient Details  Name: Kim Ross MRN: [id number] Date of Birth: 1955-10-06  Transition of Care [facility]) CM/SW Contact  Leeroy Cha, RN Phone Number: 09/24/2021, 9:22 AM  Clinical Narrative:    [facility] has amde a bed offer for snf placement   Expected Discharge Plan: [place] Barriers to Discharge: Continued Medical Work up  Expected Discharge Plan and Services Expected Discharge Plan: [place]   Discharge Planning Services: CM Consult   Living arrangements for the past 2 months: Single Family Home                                       Social Determinants of Health (SDOH) Interventions    Readmission Risk Interventions Readmission Risk Prevention Plan 09/18/2021  Transportation Screening Complete  Medication Review ([place]) Complete  PCP or Specialist appointment within 3-5 days of discharge Complete  HRI or Home Care Consult Complete  Palliative Care Screening Complete  Skilled Nursing Facility Complete  Some recent data might be hidden

## 2021-09-24 NOTE — Progress Notes (Signed)
PT Cancellation Note  Patient Details Name: [name] MRN: [id number] DOB: 07-Mar-1956   Cancelled Treatment:     Pt not feeling well enough to participate and sleepy from meds.  Pt has been evaluated and will continue to monitor with attempts to see another day as schedule permits.   Rica Koyanagi  PTA Acute  Rehabilitation Services Pager      [phone] Office      [phone]

## 2021-09-24 NOTE — Progress Notes (Signed)
Pt incontinent of large amount of soft brown stool.

## 2021-09-25 LAB — BASIC METABOLIC PANEL
Anion gap: 6 (ref 5–15)
BUN: 10 mg/dL (ref 8–23)
CO2: 27 mmol/L (ref 22–32)
Calcium: 7.9 mg/dL — ABNORMAL LOW (ref 8.9–10.3)
Chloride: 101 mmol/L (ref 98–111)
Creatinine, Ser: 0.32 mg/dL — ABNORMAL LOW (ref 0.44–1.00)
GFR, Estimated: >60 mL/min (ref >60)
Glucose, Bld: 100 mg/dL — ABNORMAL HIGH (ref 70–99)
Potassium: 4.7 mmol/L (ref 3.5–5.1)
Sodium: 134 mmol/L — ABNORMAL LOW (ref 135–145)

## 2021-09-25 LAB — GLUCOSE, CAPILLARY
Glucose-Capillary: 98 mg/dL (ref 70–99)
Glucose-Capillary: 130 mg/dL — ABNORMAL HIGH (ref 70–99)
Glucose-Capillary: 121 mg/dL — ABNORMAL HIGH (ref 70–99)
Glucose-Capillary: 130 mg/dL — ABNORMAL HIGH (ref 70–99)
Glucose-Capillary: 121 mg/dL — ABNORMAL HIGH (ref 70–99)

## 2021-09-25 LAB — CBC
HCT: 25.3 % — ABNORMAL LOW (ref 36.0–46.0)
Hemoglobin: 8 g/dL — ABNORMAL LOW (ref 12.0–15.0)
MCH: 29.3 pg (ref 26.0–34.0)
MCHC: 31.6 g/dL (ref 30.0–36.0)
MCV: 92.7 fL (ref 80.0–100.0)
Platelets: 494 10*3/uL — ABNORMAL HIGH (ref 150–400)
RBC: 2.73 MIL/uL — ABNORMAL LOW (ref 3.87–5.11)
RDW: 20.9 % — ABNORMAL HIGH (ref 11.5–15.5)
WBC: 32.1 10*3/uL — ABNORMAL HIGH (ref 4.0–10.5)
nRBC: 0.6 % — ABNORMAL HIGH (ref 0.0–0.2)

## 2021-09-25 LAB — PHOSPHORUS: Phosphorus: 1.8 mg/dL — ABNORMAL LOW (ref 2.5–4.6)

## 2021-09-25 LAB — MAGNESIUM: Magnesium: 1.2 mg/dL — ABNORMAL LOW (ref 1.7–2.4)

## 2021-09-25 MED ORDER — SODIUM PHOSPHATES 45 MMOLE/15ML IV SOLN
30.0000 mmol | Freq: Once | INTRAVENOUS | Status: AC
  Administered 2021-09-25: 12:00:00 30 mmol via INTRAVENOUS
  Filled 2021-09-25: qty 10

## 2021-09-25 MED ORDER — MAGNESIUM SULFATE 2 GM/50ML IV SOLN
2.0000 g | Freq: Once | INTRAVENOUS | Status: AC
  Administered 2021-09-25: 09:00:00 2 g via INTRAVENOUS
  Filled 2021-09-25: qty 50
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
## 2021-09-25 NOTE — Progress Notes (Signed)
OT Cancellation Note  Patient Details Name: Kim Ross MRN: [id number] DOB: 1955/10/22   Cancelled Treatment:    Reason Eval/Treat Not Completed: Fatigue/lethargy limiting ability to participate: Pt declined therapy today due to fatigue. Spoke with pt regarding her goals for therapy and pt agreed that both PT and OT are too much at this time and pt agrees for OT to sign off.  Pt verbalized understanding that OT can return at any time with a doctor's order and pt can request OT's return at any time.   Julien Girt 09/25/2021, 10:59 AM

## 2021-09-26 ENCOUNTER — Encounter (HOSPITAL_COMMUNITY): Payer: Self-pay | Admitting: Internal Medicine

## 2021-09-26 LAB — GLUCOSE, CAPILLARY
Glucose-Capillary: 112 mg/dL — ABNORMAL HIGH (ref 70–99)
Glucose-Capillary: 124 mg/dL — ABNORMAL HIGH (ref 70–99)
Glucose-Capillary: 126 mg/dL — ABNORMAL HIGH (ref 70–99)
Glucose-Capillary: 136 mg/dL — ABNORMAL HIGH (ref 70–99)
Glucose-Capillary: 93 mg/dL (ref 70–99)
Glucose-Capillary: 97 mg/dL (ref 70–99)

## 2021-09-26 MED ORDER — METOPROLOL TARTRATE 25 MG PO TABS
12.5000 mg | ORAL_TABLET | Freq: Two times a day (BID) | ORAL | Status: DC
  Administered 2021-09-26 – 2021-09-28 (×5): 12.5 mg
  Filled 2021-09-26 (×5): qty 1

## 2021-09-26 MED ORDER — HYDROCODONE-ACETAMINOPHEN 5-325 MG PO TABS
1.0000 | ORAL_TABLET | ORAL | Status: DC | PRN
  Administered 2021-09-26 – 2021-09-28 (×6): 1
  Filled 2021-09-26 (×6): qty 1

## 2021-09-26 MED ORDER — POLYETHYLENE GLYCOL 3350 17 G PO PACK
17.0000 g | PACK | Freq: Every day | ORAL | Status: DC
  Filled 2021-09-26 (×3): qty 1

## 2021-09-26 MED ORDER — MAGNESIUM SULFATE 2 GM/50ML IV SOLN
2.0000 g | Freq: Once | INTRAVENOUS | Status: AC
  Administered 2021-09-26: 10:00:00 2 g via INTRAVENOUS
  Filled 2021-09-26: qty 50
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
## 2021-09-26 NOTE — Consult Note (Signed)
Cardiology Consultation:   Patient ID: Kim Ross; [id number]; 05/09/1956   Admit date: 08/25/2021 Date of Consult: 09/26/2021  Primary Care Provider: Erskine Emery, MD Primary Cardiologist: Skeet Latch, MD Primary Electrophysiologist: None   Patient Profile:   Kim Ross is a 65 y.o. 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 Dwyane Dee.  History of Present Illness:   Kim Ross is currently admitted in the hospital with recurrent tachycardia, reportedly found to have rapid atrial fibrillation when sent from the oncology clinic to the ER, no ECG available for review.  She has a known history of paroxysmal atrial fibrillation with CHA2DS2-VASc score of 2.  She was seen by our cardiology service during hospitalization in November at which point amiodarone was initiated.  She has not been anticoagulated long-term given concern for bleeding risk, particularly with brain metastasis, also poor prognosis.  She does tend to run a low blood pressure, although this has been stable on midodrine.  She has struggled with odynophagia and dysphagia due to esophageal stenosis, has a PEG tube in place.  Medications are given per changes.  Review of telemetry this morning shows sinus tachycardia.  She states that she has noticed fluctuating heart rate for weeks to months.  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 Gery Pray    History of recurrent UTIs    Malignant pleural effusion    Mixed hyperlipidemia    Non-small cell lung cancer metastatic to brain ([place])    Stage IV   Paroxysmal atrial fibrillation ([place])    Pneumonia    Protein calorie malnutrition ([place])    Type 2 diabetes mellitus ([place])     Past Surgical History:  Procedure Laterality Date   ABDOMINAL HYSTERECTOMY     BALLOON DILATION N/A 09/01/2021   Procedure: BALLOON DILATION;  Surgeon: Clarene Essex, MD;  Location: [facility];  Service: Endoscopy;  Laterality: N/A;   BRONCHIAL NEEDLE ASPIRATION BIOPSY  06/17/2021   Procedure: BRONCHIAL NEEDLE ASPIRATION BIOPSIES;  Surgeon: Maryjane Hurter, MD;  Location: [facility];  Service: Pulmonary;;   ENDOBRONCHIAL ULTRASOUND Left 06/17/2021   Procedure: ENDOBRONCHIAL ULTRASOUND;  Surgeon: Maryjane Hurter, MD;  Location: [facility];  Service: Pulmonary;  Laterality: Left;   ESOPHAGOGASTRODUODENOSCOPY N/A 09/01/2021   Procedure: ESOPHAGOGASTRODUODENOSCOPY (EGD);  Surgeon: Clarene Essex, MD;  Location: [facility];  Service: Endoscopy;  Laterality: N/A;   HEMORRHOID SURGERY     IR GASTROSTOMY TUBE MOD SED  09/14/2021   VIDEO BRONCHOSCOPY  06/17/2021   Procedure: VIDEO BRONCHOSCOPY;  Surgeon: Maryjane Hurter, MD;  Location: [facility];  Service: Pulmonary;;     Inpatient Medications: Scheduled Meds:  amiodarone  200 mg Per Tube Daily   atorvastatin  40 mg Per Tube Daily   Chlorhexidine Gluconate Cloth  6 each Topical Q0600   dicyclomine  10 mg Per Tube TID AC & HS  docusate  100 mg Per Tube BID   And   sennosides  5 mL Per Tube BID   enoxaparin (LOVENOX) injection  1 mg/kg Subcutaneous Q12H   feeding supplement  237 mL Per Tube TID BM   fentaNYL  1 patch Transdermal N62X   folic acid  1 mg Per Tube Daily   free water  100 mL Per Tube Q4H   insulin aspart  0-9 Units Subcutaneous TID WC   magic mouthwash  5 mL Oral QID   magnesium oxide  800 mg Per Tube Daily   mouth rinse  15 mL Mouth  Rinse BID   melatonin  3 mg Per Tube QHS   midodrine  2.5 mg Per Tube TID WC   mirtazapine  15 mg Oral QHS   multivitamin  15 mL Per Tube QODAY   pantoprazole sodium  40 mg Per Tube BID   polyethylene glycol  17 g Per Tube Daily   sertraline  50 mg Per Tube Daily   tamsulosin  0.4 mg Oral QPC supper   zinc oxide   Topical TID   Continuous Infusions:  sodium chloride Stopped (09/07/21 1015)   feeding supplement (OSMOLITE 1.5 CAL) 1,000 mL (09/23/21 1812)   PRN Meds: sodium chloride, bisacodyl, guaiFENesin-dextromethorphan, HYDROcodone-acetaminophen, ipratropium-albuterol, meclizine, mineral oil-hydrophilic petrolatum, ondansetron (ZOFRAN) IV  Allergies:    Allergies  Allergen Reactions   Ibuprofen Nausea And Vomiting   Trazodone And Nefazodone     AM Dizziness & Drowsiness     Social History:   Social History   Tobacco Use   Smoking status: Some Days    Packs/day: 0.25    Types: Cigarettes   Smokeless tobacco: Never  Substance Use Topics   Alcohol use: Not Currently    Alcohol/week: 1.0 standard drink    Types: 1 Cans of beer per week     Family History:   The patient's family history includes Asthma in her mother; Cancer (age of onset: 3) in her sister.  ROS:  Please see the history of present illness.  Weakness, dysphagia, intermittent sense of palpitations, constipation. All other ROS reviewed and negative.     Physical Exam/Data:   Vitals:   09/25/21 2246 09/26/21 0418 09/26/21 0500 09/26/21 0906  BP: 123/78 129/70  135/71  Pulse: (!) 108 (!) 110    Resp: 18 20    Temp: 98.2 F (36.8 C) (!) 97.4 F (36.3 C)    TempSrc: Oral Oral    SpO2: 99% 100%    Weight:   60.1 kg   Height:        Intake/Output Summary (Last 24 hours) at 09/26/2021 1038 Last data filed at 09/26/2021 0958 Gross per 24 hour  Intake 180 ml  Output 1350 ml  Net -1170 ml   Filed Weights   09/24/21 0500 09/25/21 0500 09/26/21 0500  Weight: 58.3 kg 60 kg 60.1 kg   Body mass  index is 22.05 kg/m.   Gen: Chronically ill-appearing, cachectic woman in no acute distress. HEENT: Conjunctiva and lids normal.. Neck: Supple, no elevated JVP or carotid bruits, no thyromegaly. Lungs: Decreased breath sounds on the left, nonlabored breathing at rest. Cardiac: RRR without obvious gallop or rub. Abdomen: Soft, bowel sounds present, PEG tube in place. Extremities: No pitting edema, muscle wasting noted. Skin: Warm and dry. Musculoskeletal: No kyphosis. Neuropsychiatric: Alert and oriented x3, affect grossly appropriate.  EKG:  An ECG dated 09/04/2021 was personally reviewed today and demonstrated:  Atrial fibrillation with RVR and  diffuse ST segment abnormalities.  Telemetry:  I personally reviewed telemetry which shows sinus tachycardia.  Relevant CV Studies:  Echocardiogram 08/26/2021:  1. Left ventricular ejection fraction, by estimation, is 55 to 60%. The  left ventricle has normal function. The left ventricle has no regional  wall motion abnormalities.   2. Right ventricular systolic function is normal. The right ventricular  size is normal. There is normal pulmonary artery systolic pressure. The  estimated right ventricular systolic pressure is 09.9 mmHg.   3. The mitral valve is normal in structure. Trivial mitral valve  regurgitation. No evidence of mitral stenosis.   4. The aortic valve was not well visualized. Aortic valve regurgitation  is not visualized. No aortic stenosis is present.   5. The inferior vena cava is normal in size with greater than 50%  respiratory variability, suggesting right atrial pressure of 3 mmHg.   Laboratory Data:  Chemistry Recent Labs  Lab 09/22/21 0449 09/23/21 0514 09/25/21 0527  NA 137 135 134*  K 3.7 4.1 4.7  CL 101 101 101  CO2 29 29 27   GLUCOSE 125* 154* 100*  BUN 8 10 10   CREATININE 0.32* 0.30* 0.32*  CALCIUM 7.8* 7.4* 7.9*  GFRNONAA >60 >60 >60  ANIONGAP 7 5 6      Hematology Recent Labs  Lab  09/23/21 0514 09/23/21 1301 09/24/21 0500 09/25/21 0527  WBC 28.0*  --  27.4* 32.1*  RBC 2.57*  --  2.71* 2.73*  HGB 7.7* 8.3* 7.9* 8.0*  HCT 23.9* 26.3* 25.4* 25.3*  MCV 93.0  --  93.7 92.7  MCH 30.0  --  29.2 29.3  MCHC 32.2  --  31.1 31.6  RDW 19.9*  --  20.6* 20.9*  PLT 380  --  448* 494*    Radiology/Studies:  No results found.  Assessment and Plan:   1.  Paroxysmal atrial fibrillation with CHA2DS2-VASc score of 2.  Looks to be in sinus tachycardia this morning.  She has not been chronically anticoagulated given concerns for bleeding particularly with brain metastasis, also overall poor prognosis.  Currently on amiodarone 200 mg per tube daily.  2.  History of hypotension requiring midodrine, blood pressure is stable at present.  3.  Stage IV non-small cell lung cancer with brain metastasis.  Continues to undergo chemotherapy through the oncology clinic.  Overall poor prognosis DNR status.  Palliative care consulted as well.  Obtain ECG.  Continue amiodarone 200 mg per tube daily and start Lopressor 12.5 mg per tube twice daily.  Follow blood pressure and otherwise continue midodrine.  Signed, Rozann Lesches, MD  09/26/2021 10:38 AM

## 2021-09-27 LAB — BASIC METABOLIC PANEL
Anion gap: 6 (ref 5–15)
BUN: 12 mg/dL (ref 8–23)
CO2: 30 mmol/L (ref 22–32)
Calcium: 7.7 mg/dL — ABNORMAL LOW (ref 8.9–10.3)
Chloride: 98 mmol/L (ref 98–111)
Creatinine, Ser: 0.31 mg/dL — ABNORMAL LOW (ref 0.44–1.00)
GFR, Estimated: >60 mL/min (ref >60)
Glucose, Bld: 125 mg/dL — ABNORMAL HIGH (ref 70–99)
Potassium: 4.9 mmol/L (ref 3.5–5.1)
Sodium: 134 mmol/L — ABNORMAL LOW (ref 135–145)

## 2021-09-27 LAB — GLUCOSE, CAPILLARY
Glucose-Capillary: 124 mg/dL — ABNORMAL HIGH (ref 70–99)
Glucose-Capillary: 116 mg/dL — ABNORMAL HIGH (ref 70–99)
Glucose-Capillary: 125 mg/dL — ABNORMAL HIGH (ref 70–99)
Glucose-Capillary: 131 mg/dL — ABNORMAL HIGH (ref 70–99)

## 2021-09-27 LAB — CBC
HCT: 24.4 % — ABNORMAL LOW (ref 36.0–46.0)
Hemoglobin: 7.6 g/dL — ABNORMAL LOW (ref 12.0–15.0)
MCH: 29.3 pg (ref 26.0–34.0)
MCHC: 31.1 g/dL (ref 30.0–36.0)
MCV: 94.2 fL (ref 80.0–100.0)
Platelets: 559 10*3/uL — ABNORMAL HIGH (ref 150–400)
RBC: 2.59 MIL/uL — ABNORMAL LOW (ref 3.87–5.11)
RDW: 21.5 % — ABNORMAL HIGH (ref 11.5–15.5)
WBC: 32.2 10*3/uL — ABNORMAL HIGH (ref 4.0–10.5)
nRBC: 0.8 % — ABNORMAL HIGH (ref 0.0–0.2)

## 2021-09-27 LAB — MAGNESIUM: Magnesium: 1.6 mg/dL — ABNORMAL LOW (ref 1.7–2.4)

## 2021-09-27 LAB — PHOSPHORUS: Phosphorus: 3.6 mg/dL (ref 2.5–4.6)

## 2021-09-27 MED ORDER — MAGNESIUM SULFATE 2 GM/50ML IV SOLN
2.0000 g | Freq: Once | INTRAVENOUS | Status: AC
  Administered 2021-09-27: 13:00:00 2 g via INTRAVENOUS
  Filled 2021-09-27: qty 50

## 2021-09-27 NOTE — TOC Progression Note (Signed)
Transition of Care [facility]) - Progression Note    Patient Details  Name: Kim Ross MRN: [id number] Date of Birth: May 09, 1956  [facility]) CM/SW Contact  Marwin Primmer, Juliann Pulse, RN Phone Number: 09/27/2021, 12:37 PM  Clinical Narrative: TC [facility] rep [facility] to accept(depending on availability) Monday, or Tuesday-TOC to f/u in am if bed available.Will need rapid covid done once confirmed bed available.MD/Nsg updated.      Expected Discharge Plan: [place] Barriers to Discharge: Continued Medical Work up  Expected Discharge Plan and Services Expected Discharge Plan: [place]   Discharge Planning Services: CM Consult   Living arrangements for the past 2 months: Single Family Home                                       Social Determinants of Health (SDOH) Interventions    Readmission Risk Interventions Readmission Risk Prevention Plan 09/18/2021  Transportation Screening Complete  Medication Review [employer]) Complete  PCP or Specialist appointment within 3-5 days of discharge Complete  HRI or Home Care Consult Complete  Palliative Care Screening Complete  Skilled Nursing Facility Complete  Some recent data might be hidden
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
## 2021-09-27 NOTE — Progress Notes (Signed)
° °  Progress Note  Patient Name: Kim Ross Date of Encounter: 09/27/2021  Primary Cardiologist: Skeet Latch, MD  Please see cardiology consultation note from yesterday.  Patient remains in sinus rhythm, confirmed by ECG.  Recent heart rates 90s to low 100s.  Telemetry shows sinus rhythm and sinus tachycardia.  Systolic blood pressure 412-878 range.  Would continue amiodarone 200 mg daily, Lopressor 12.5 mg twice daily was added yesterday.  She also remains on midodrine with stable blood pressure.  Signed, Rozann Lesches, MD  09/27/2021, 10:18 AM

## 2021-09-27 NOTE — Plan of Care (Signed)

## 2021-09-28 LAB — CBC
HCT: 25 % — ABNORMAL LOW (ref 36.0–46.0)
Hemoglobin: 7.9 g/dL — ABNORMAL LOW (ref 12.0–15.0)
MCH: 29.7 pg (ref 26.0–34.0)
MCHC: 31.6 g/dL (ref 30.0–36.0)
MCV: 94 fL (ref 80.0–100.0)
Platelets: 550 10*3/uL — ABNORMAL HIGH (ref 150–400)
RBC: 2.66 MIL/uL — ABNORMAL LOW (ref 3.87–5.11)
RDW: 22 % — ABNORMAL HIGH (ref 11.5–15.5)
WBC: 35 10*3/uL — ABNORMAL HIGH (ref 4.0–10.5)
nRBC: 0.6 % — ABNORMAL HIGH (ref 0.0–0.2)

## 2021-09-28 LAB — RESP PANEL BY RT-PCR (FLU A&B, COVID) ARPGX2
Influenza A by PCR: NEGATIVE
Influenza B by PCR: NEGATIVE
SARS Coronavirus 2 by RT PCR: NEGATIVE

## 2021-09-28 LAB — GLUCOSE, CAPILLARY
Glucose-Capillary: 123 mg/dL — ABNORMAL HIGH (ref 70–99)
Glucose-Capillary: 124 mg/dL — ABNORMAL HIGH (ref 70–99)
Glucose-Capillary: 84 mg/dL (ref 70–99)
Glucose-Capillary: 86 mg/dL (ref 70–99)
Glucose-Capillary: 87 mg/dL (ref 70–99)

## 2021-09-28 LAB — MAGNESIUM: Magnesium: 1.7 mg/dL (ref 1.7–2.4)

## 2021-09-28 MED ORDER — MIDODRINE HCL 2.5 MG PO TABS: 2.5000 mg | ORAL_TABLET | Freq: Three times a day (TID) | ORAL | Status: DC

## 2021-09-28 MED ORDER — FENTANYL 50 MCG/HR TD PT72: 1.0000 | MEDICATED_PATCH | TRANSDERMAL | 0 refills | Status: DC

## 2021-09-28 MED ORDER — METOPROLOL TARTRATE 25 MG PO TABS: 12.5000 mg | ORAL_TABLET | Freq: Two times a day (BID) | ORAL | 2 refills | Status: AC

## 2021-09-28 MED ORDER — ENOXAPARIN SODIUM 60 MG/0.6ML IJ SOSY: 1.0000 mg/kg | PREFILLED_SYRINGE | Freq: Two times a day (BID) | INTRAMUSCULAR | 1 refills | Status: DC

## 2021-09-28 MED ORDER — HYDROCODONE-ACETAMINOPHEN 5-325 MG PO TABS
1.0000 | ORAL_TABLET | ORAL | 0 refills | Status: DC | PRN
Start: 2021-09-28 — End: 2021-10-02

## 2021-09-28 MED ORDER — MIRTAZAPINE 15 MG PO TBDP
15.0000 mg | ORAL_TABLET | Freq: Every day | ORAL | 2 refills | Status: DC
Start: 2021-09-28 — End: 2021-10-06

## 2021-09-28 MED ORDER — TAMSULOSIN HCL 0.4 MG PO CAPS: 0.4000 mg | ORAL_CAPSULE | Freq: Every day | ORAL | 2 refills | Status: DC

## 2021-09-28 MED ORDER — AMIODARONE HCL 200 MG PO TABS: 200.0000 mg | ORAL_TABLET | Freq: Every day | ORAL | 1 refills | Status: DC

## 2021-09-28 NOTE — TOC Transition Note (Addendum)
Transition of Care [facility]) - CM/SW Discharge Note   Patient Details  Name: Kim Ross MRN: [id number] Date of Birth: [birth year]  [facility]) CM/SW Contact:  Leeroy Cha, RN Phone Number: 09/28/2021, 11:55 AM   Clinical Narrative:    Patient going to [facility].  Transport packet ready and at the unit clerk's desk for RN to call transport when covid results are back. Ssn and dob are on the travel packet. Dc summary faxed to [facility] via the hub  Final next level of care: [place] Barriers to Discharge: Continued Medical Work up   Patient Goals and CMS Choice Patient states their goals for this hospitalization and ongoing recovery are:: to go home CMS Medicare.gov Compare Post Acute Care list provided to:: Patient    Discharge Placement                       Discharge Plan and Services   Discharge Planning Services: CM Consult                                 Social Determinants of Health (SDOH) Interventions     Readmission Risk Interventions Readmission Risk Prevention Plan 09/18/2021  Transportation Screening Complete  Medication Review ([place]) Complete  PCP or Specialist appointment within 3-5 days of discharge Complete  HRI or Home Care Consult Complete  Palliative Care Screening Complete  Skilled Nursing Facility Complete  Some recent data might be hidden

## 2021-09-28 NOTE — Progress Notes (Signed)
Report called and given to [facility].

## 2021-09-28 NOTE — TOC Progression Note (Addendum)
Transition of Care [facility]) - Progression Note    Patient Details  Name: Kim Ross MRN: [id number] Date of Birth: [birth year]  Transition of Care [facility]) CM/SW Contact  Leeroy Cha, RN Phone Number: 09/28/2021, 10:16 AM  Clinical Narrative:    Text to [facility] to see if bed available to today [facility] have a bed today but cannot hold past today.  Tct-Briaana the daughter understand that if patient does not go today will lose the bed.  Expected Discharge Plan: [place] Barriers to Discharge: Continued Medical Work up  Expected Discharge Plan and Services Expected Discharge Plan: [place]   Discharge Planning Services: CM Consult   Living arrangements for the past 2 months: Single Family Home                                       Social Determinants of Health (SDOH) Interventions    Readmission Risk Interventions Readmission Risk Prevention Plan 09/18/2021  Transportation Screening Complete  Medication Review [employer] photographer) Complete  PCP or Specialist appointment within 3-5 days of discharge Complete  HRI or Home Care Consult Complete  Palliative Care Screening Complete  Skilled Nursing Facility Complete  Some recent data might be hidden

## 2021-09-28 NOTE — Discharge Instructions (Signed)
Advised to follow-up with primary care physician in 1 week. Patient is being discharged to skilled nursing facility for rehab. Advised to take metoprolol, amiodarone for atrial fibrillation. Advised to take midodrine for hypotension. Continue continuous tube feeds.

## 2021-09-28 NOTE — Progress Notes (Signed)
° °  Progress Note  Patient Name: Kim Ross Date of Encounter: 09/28/2021  Primary Cardiologist: Skeet Latch, MD  Telemetry reviewed showing sinus rhythm and sinus tachycardia, no recurrent atrial fibrillation.  Systolic blood pressure ranging 100-130.  Would plan to continue current cardiac regimen including amiodarone, Lopressor, and midodrine.  Signed, Rozann Lesches, MD  09/28/2021, 9:01 AM
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
## 2021-09-29 ENCOUNTER — Encounter (HOSPITAL_COMMUNITY): Payer: Self-pay | Admitting: Oncology

## 2021-09-29 ENCOUNTER — Inpatient Hospital Stay (HOSPITAL_COMMUNITY)
Admission: EM | Admit: 2021-09-29 | Discharge: 2021-10-20 | DRG: 180 | Disposition: A | Discharge status: Hospice | Payer: 59 | Source: Skilled Nursing Facility | Attending: Internal Medicine | Admitting: Internal Medicine

## 2021-09-29 ENCOUNTER — Observation Stay (HOSPITAL_COMMUNITY): Payer: 59

## 2021-09-29 ENCOUNTER — Emergency Department (HOSPITAL_COMMUNITY): Payer: 59

## 2021-09-29 ENCOUNTER — Other Ambulatory Visit: Payer: Self-pay

## 2021-09-29 DIAGNOSIS — I82452 Acute embolism and thrombosis of left peroneal vein: Secondary | ICD-10-CM | POA: Diagnosis present

## 2021-09-29 DIAGNOSIS — Z8701 Personal history of pneumonia (recurrent): Secondary | ICD-10-CM

## 2021-09-29 DIAGNOSIS — C7951 Secondary malignant neoplasm of bone: Secondary | ICD-10-CM | POA: Diagnosis present

## 2021-09-29 DIAGNOSIS — E782 Mixed hyperlipidemia: Secondary | ICD-10-CM | POA: Diagnosis present

## 2021-09-29 DIAGNOSIS — G893 Neoplasm related pain (acute) (chronic): Secondary | ICD-10-CM | POA: Diagnosis present

## 2021-09-29 DIAGNOSIS — R112 Nausea with vomiting, unspecified: Secondary | ICD-10-CM | POA: Diagnosis present

## 2021-09-29 DIAGNOSIS — R110 Nausea: Secondary | ICD-10-CM

## 2021-09-29 DIAGNOSIS — C3492 Malignant neoplasm of unspecified part of left bronchus or lung: Secondary | ICD-10-CM | POA: Diagnosis present

## 2021-09-29 DIAGNOSIS — D6481 Anemia due to antineoplastic chemotherapy: Secondary | ICD-10-CM | POA: Diagnosis present

## 2021-09-29 DIAGNOSIS — R64 Cachexia: Secondary | ICD-10-CM | POA: Diagnosis present

## 2021-09-29 DIAGNOSIS — Z886 Allergy status to analgesic agent status: Secondary | ICD-10-CM

## 2021-09-29 DIAGNOSIS — Z0000 Encounter for general adult medical examination without abnormal findings: Secondary | ICD-10-CM

## 2021-09-29 DIAGNOSIS — L89152 Pressure ulcer of sacral region, stage 2: Secondary | ICD-10-CM | POA: Diagnosis present

## 2021-09-29 DIAGNOSIS — D72829 Elevated white blood cell count, unspecified: Secondary | ICD-10-CM | POA: Diagnosis present

## 2021-09-29 DIAGNOSIS — D630 Anemia in neoplastic disease: Secondary | ICD-10-CM | POA: Diagnosis present

## 2021-09-29 DIAGNOSIS — Z79899 Other long term (current) drug therapy: Secondary | ICD-10-CM

## 2021-09-29 DIAGNOSIS — L8990 Pressure ulcer of unspecified site, unspecified stage: Secondary | ICD-10-CM | POA: Diagnosis present

## 2021-09-29 DIAGNOSIS — R54 Age-related physical debility: Secondary | ICD-10-CM | POA: Diagnosis present

## 2021-09-29 DIAGNOSIS — J90 Pleural effusion, not elsewhere classified: Secondary | ICD-10-CM

## 2021-09-29 DIAGNOSIS — E43 Unspecified severe protein-calorie malnutrition: Secondary | ICD-10-CM | POA: Diagnosis present

## 2021-09-29 DIAGNOSIS — Z681 Body mass index (BMI) 19 or less, adult: Secondary | ICD-10-CM

## 2021-09-29 DIAGNOSIS — D72828 Other elevated white blood cell count: Secondary | ICD-10-CM | POA: Diagnosis present

## 2021-09-29 DIAGNOSIS — Z931 Gastrostomy status: Secondary | ICD-10-CM

## 2021-09-29 DIAGNOSIS — Z9689 Presence of other specified functional implants: Secondary | ICD-10-CM

## 2021-09-29 DIAGNOSIS — E785 Hyperlipidemia, unspecified: Secondary | ICD-10-CM | POA: Diagnosis present

## 2021-09-29 DIAGNOSIS — F411 Generalized anxiety disorder: Secondary | ICD-10-CM | POA: Diagnosis present

## 2021-09-29 DIAGNOSIS — D638 Anemia in other chronic diseases classified elsewhere: Secondary | ICD-10-CM | POA: Diagnosis present

## 2021-09-29 DIAGNOSIS — C7931 Secondary malignant neoplasm of brain: Secondary | ICD-10-CM | POA: Diagnosis present

## 2021-09-29 DIAGNOSIS — E119 Type 2 diabetes mellitus without complications: Secondary | ICD-10-CM | POA: Diagnosis present

## 2021-09-29 DIAGNOSIS — F17210 Nicotine dependence, cigarettes, uncomplicated: Secondary | ICD-10-CM | POA: Diagnosis present

## 2021-09-29 DIAGNOSIS — Z87440 Personal history of urinary (tract) infections: Secondary | ICD-10-CM

## 2021-09-29 DIAGNOSIS — J910 Malignant pleural effusion: Secondary | ICD-10-CM | POA: Diagnosis present

## 2021-09-29 DIAGNOSIS — Z825 Family history of asthma and other chronic lower respiratory diseases: Secondary | ICD-10-CM

## 2021-09-29 DIAGNOSIS — Z800 Family history of malignant neoplasm of digestive organs: Secondary | ICD-10-CM

## 2021-09-29 DIAGNOSIS — N320 Bladder-neck obstruction: Secondary | ICD-10-CM | POA: Diagnosis present

## 2021-09-29 DIAGNOSIS — Z7984 Long term (current) use of oral hypoglycemic drugs: Secondary | ICD-10-CM

## 2021-09-29 DIAGNOSIS — I82442 Acute embolism and thrombosis of left tibial vein: Secondary | ICD-10-CM | POA: Diagnosis present

## 2021-09-29 DIAGNOSIS — T451X5A Adverse effect of antineoplastic and immunosuppressive drugs, initial encounter: Secondary | ICD-10-CM | POA: Diagnosis present

## 2021-09-29 DIAGNOSIS — I480 Paroxysmal atrial fibrillation: Secondary | ICD-10-CM | POA: Diagnosis present

## 2021-09-29 DIAGNOSIS — Z978 Presence of other specified devices: Secondary | ICD-10-CM

## 2021-09-29 DIAGNOSIS — Z66 Do not resuscitate: Secondary | ICD-10-CM | POA: Diagnosis present

## 2021-09-29 DIAGNOSIS — I82402 Acute embolism and thrombosis of unspecified deep veins of left lower extremity: Secondary | ICD-10-CM | POA: Diagnosis present

## 2021-09-29 DIAGNOSIS — I82412 Acute embolism and thrombosis of left femoral vein: Secondary | ICD-10-CM | POA: Diagnosis present

## 2021-09-29 DIAGNOSIS — Z20822 Contact with and (suspected) exposure to covid-19: Secondary | ICD-10-CM | POA: Diagnosis present

## 2021-09-29 DIAGNOSIS — X58XXXS Exposure to other specified factors, sequela: Secondary | ICD-10-CM | POA: Diagnosis present

## 2021-09-29 DIAGNOSIS — C3432 Malignant neoplasm of lower lobe, left bronchus or lung: Secondary | ICD-10-CM | POA: Diagnosis not present

## 2021-09-29 DIAGNOSIS — I4891 Unspecified atrial fibrillation: Secondary | ICD-10-CM | POA: Diagnosis present

## 2021-09-29 DIAGNOSIS — I82432 Acute embolism and thrombosis of left popliteal vein: Secondary | ICD-10-CM | POA: Diagnosis present

## 2021-09-29 DIAGNOSIS — J309 Allergic rhinitis, unspecified: Secondary | ICD-10-CM

## 2021-09-29 DIAGNOSIS — Z888 Allergy status to other drugs, medicaments and biological substances status: Secondary | ICD-10-CM

## 2021-09-29 DIAGNOSIS — K219 Gastro-esophageal reflux disease without esophagitis: Secondary | ICD-10-CM | POA: Diagnosis present

## 2021-09-29 DIAGNOSIS — Z515 Encounter for palliative care: Secondary | ICD-10-CM

## 2021-09-29 DIAGNOSIS — T148XXS Other injury of unspecified body region, sequela: Secondary | ICD-10-CM

## 2021-09-29 DIAGNOSIS — J9621 Acute and chronic respiratory failure with hypoxia: Secondary | ICD-10-CM | POA: Diagnosis present

## 2021-09-29 DIAGNOSIS — Z923 Personal history of irradiation: Secondary | ICD-10-CM

## 2021-09-29 DIAGNOSIS — Z90710 Acquired absence of both cervix and uterus: Secondary | ICD-10-CM

## 2021-09-29 DIAGNOSIS — K5900 Constipation, unspecified: Secondary | ICD-10-CM | POA: Diagnosis present

## 2021-09-29 DIAGNOSIS — Z960 Presence of urogenital implants: Secondary | ICD-10-CM | POA: Diagnosis present

## 2021-09-29 DIAGNOSIS — F32A Depression, unspecified: Secondary | ICD-10-CM | POA: Diagnosis present

## 2021-09-29 LAB — CBC WITH DIFFERENTIAL/PLATELET
Abs Immature Granulocytes: 1.2 10*3/uL — ABNORMAL HIGH (ref 0.00–0.07)
Basophils Absolute: 0.1 10*3/uL (ref 0.0–0.1)
Basophils Relative: 0 %
Eosinophils Absolute: 0 10*3/uL (ref 0.0–0.5)
Eosinophils Relative: 0 %
HCT: 27 % — ABNORMAL LOW (ref 36.0–46.0)
Hemoglobin: 8.2 g/dL — ABNORMAL LOW (ref 12.0–15.0)
Immature Granulocytes: 4 %
Lymphocytes Relative: 2 %
Lymphs Abs: 0.6 10*3/uL — ABNORMAL LOW (ref 0.7–4.0)
MCH: 29.5 pg (ref 26.0–34.0)
MCHC: 30.4 g/dL (ref 30.0–36.0)
MCV: 97.1 fL (ref 80.0–100.0)
Monocytes Absolute: 1.5 10*3/uL — ABNORMAL HIGH (ref 0.1–1.0)
Monocytes Relative: 4 %
Neutro Abs: 30.6 10*3/uL — ABNORMAL HIGH (ref 1.7–7.7)
Neutrophils Relative %: 90 %
Platelets: 679 10*3/uL — ABNORMAL HIGH (ref 150–400)
RBC: 2.78 MIL/uL — ABNORMAL LOW (ref 3.87–5.11)
RDW: 22.2 % — ABNORMAL HIGH (ref 11.5–15.5)
WBC: 33.9 10*3/uL — ABNORMAL HIGH (ref 4.0–10.5)
nRBC: 0.6 % — ABNORMAL HIGH (ref 0.0–0.2)

## 2021-09-29 LAB — COMPREHENSIVE METABOLIC PANEL
ALT: 27 U/L (ref 0–44)
AST: 31 U/L (ref 15–41)
Albumin: 1.7 g/dL — ABNORMAL LOW (ref 3.5–5.0)
Alkaline Phosphatase: 311 U/L — ABNORMAL HIGH (ref 38–126)
Anion gap: 8 (ref 5–15)
BUN: 17 mg/dL (ref 8–23)
CO2: 29 mmol/L (ref 22–32)
Calcium: 8.1 mg/dL — ABNORMAL LOW (ref 8.9–10.3)
Chloride: 99 mmol/L (ref 98–111)
Creatinine, Ser: 0.42 mg/dL — ABNORMAL LOW (ref 0.44–1.00)
GFR, Estimated: >60 mL/min (ref >60)
Glucose, Bld: 182 mg/dL — ABNORMAL HIGH (ref 70–99)
Potassium: 4.7 mmol/L (ref 3.5–5.1)
Sodium: 136 mmol/L (ref 135–145)
Total Bilirubin: 0.5 mg/dL (ref 0.3–1.2)
Total Protein: 6.4 g/dL — ABNORMAL LOW (ref 6.5–8.1)

## 2021-09-29 LAB — RESP PANEL BY RT-PCR (FLU A&B, COVID) ARPGX2
Influenza A by PCR: NEGATIVE
Influenza B by PCR: NEGATIVE
SARS Coronavirus 2 by RT PCR: NEGATIVE

## 2021-09-29 LAB — TROPONIN I (HIGH SENSITIVITY)
Troponin I (High Sensitivity): 14 ng/L (ref <18)
Troponin I (High Sensitivity): 11 ng/L (ref <18)

## 2021-09-29 LAB — PHOSPHORUS: Phosphorus: 4 mg/dL (ref 2.5–4.6)

## 2021-09-29 LAB — BRAIN NATRIURETIC PEPTIDE: B Natriuretic Peptide: 255.9 pg/mL — ABNORMAL HIGH (ref 0.0–100.0)

## 2021-09-29 LAB — MAGNESIUM: Magnesium: 1.5 mg/dL — ABNORMAL LOW (ref 1.7–2.4)

## 2021-09-29 MED ORDER — ONDANSETRON HCL 4 MG/2ML IJ SOLN
4.0000 mg | Freq: Four times a day (QID) | INTRAMUSCULAR | Status: DC | PRN
  Administered 2021-10-10 – 2021-10-20 (×12): 4 mg via INTRAVENOUS
  Filled 2021-09-29 (×13): qty 2

## 2021-09-29 MED ORDER — METFORMIN HCL 500 MG PO TABS: 250.0000 mg | ORAL_TABLET | Freq: Every day | ORAL | Status: DC

## 2021-09-29 MED ORDER — IPRATROPIUM-ALBUTEROL 0.5-2.5 (3) MG/3ML IN SOLN
3.0000 mL | Freq: Once | RESPIRATORY_TRACT | Status: AC
  Administered 2021-09-29: 12:00:00 3 mL via RESPIRATORY_TRACT
  Filled 2021-09-29: qty 3

## 2021-09-29 MED ORDER — ACETAMINOPHEN 325 MG PO TABS: 650.0000 mg | ORAL_TABLET | Freq: Four times a day (QID) | ORAL | Status: DC | PRN

## 2021-09-29 MED ORDER — CHLORHEXIDINE GLUCONATE 0.12 % MT SOLN
15.0000 mL | Freq: Two times a day (BID) | OROMUCOSAL | Status: DC
  Administered 2021-09-29 – 2021-10-20 (×35): 15 mL via OROMUCOSAL
  Filled 2021-09-29 (×32): qty 15

## 2021-09-29 MED ORDER — ALBUTEROL SULFATE (2.5 MG/3ML) 0.083% IN NEBU: 2.5000 mg | INHALATION_SOLUTION | RESPIRATORY_TRACT | Status: DC | PRN

## 2021-09-29 MED ORDER — ONDANSETRON HCL 4 MG PO TABS
4.0000 mg | ORAL_TABLET | Freq: Four times a day (QID) | ORAL | Status: DC | PRN
  Administered 2021-10-10 – 2021-10-17 (×10): 4 mg
  Filled 2021-09-29 (×11): qty 1

## 2021-09-29 MED ORDER — OSMOLITE 1.5 CAL PO LIQD
1000.0000 mL | ORAL | Status: DC
Start: 2021-09-29 — End: 2021-10-02
  Administered 2021-09-29 – 2021-09-30 (×2): 1000 mL
  Filled 2021-09-29 (×5): qty 1000

## 2021-09-29 MED ORDER — AMIODARONE HCL 200 MG PO TABS
200.0000 mg | ORAL_TABLET | Freq: Every day | ORAL | Status: DC
  Administered 2021-09-29 – 2021-10-20 (×22): 200 mg
  Filled 2021-09-29 (×23): qty 1

## 2021-09-29 MED ORDER — OXYCODONE HCL 5 MG PO TABS
5.0000 mg | ORAL_TABLET | ORAL | Status: DC | PRN
  Administered 2021-09-29 – 2021-10-01 (×5): 5 mg
  Filled 2021-09-29 (×5): qty 1

## 2021-09-29 MED ORDER — OSMOLITE 1.5 CAL PO LIQD
1000.0000 mL | ORAL | Status: DC
Start: 2021-09-29 — End: 2021-09-29

## 2021-09-29 MED ORDER — MECLIZINE HCL 25 MG PO TABS
25.0000 mg | ORAL_TABLET | Freq: Two times a day (BID) | ORAL | Status: DC
  Administered 2021-09-29 – 2021-10-20 (×41): 25 mg
  Filled 2021-09-29 (×41): qty 1

## 2021-09-29 MED ORDER — FENTANYL 50 MCG/HR TD PT72
1.0000 | MEDICATED_PATCH | TRANSDERMAL | Status: DC
  Administered 2021-10-01 – 2021-10-19 (×7): 1 via TRANSDERMAL
  Filled 2021-09-29 (×8): qty 1

## 2021-09-29 MED ORDER — ONDANSETRON HCL 4 MG/2ML IJ SOLN
4.0000 mg | Freq: Once | INTRAMUSCULAR | Status: AC
  Administered 2021-09-29: 14:00:00 4 mg via INTRAVENOUS
  Filled 2021-09-29: qty 2

## 2021-09-29 MED ORDER — ACETAMINOPHEN 650 MG RE SUPP: 650.0000 mg | Freq: Four times a day (QID) | RECTAL | Status: DC | PRN

## 2021-09-29 MED ORDER — SERTRALINE HCL 50 MG PO TABS
50.0000 mg | ORAL_TABLET | Freq: Every day | ORAL | Status: DC
  Administered 2021-09-29 – 2021-10-01 (×3): 50 mg
  Filled 2021-09-29 (×3): qty 1

## 2021-09-29 MED ORDER — SODIUM CHLORIDE 0.9% FLUSH
10.0000 mL | Freq: Three times a day (TID) | INTRAVENOUS | Status: DC
  Administered 2021-09-29 – 2021-10-13 (×30): 10 mL

## 2021-09-29 MED ORDER — ORAL CARE MOUTH RINSE
15.0000 mL | Freq: Two times a day (BID) | OROMUCOSAL | Status: DC
  Administered 2021-09-30 – 2021-10-17 (×28): 15 mL via OROMUCOSAL

## 2021-09-29 MED ORDER — IOHEXOL 350 MG/ML SOLN
50.0000 mL | Freq: Once | INTRAVENOUS | Status: AC | PRN
  Administered 2021-09-29: 15:00:00 50 mL via INTRAVENOUS

## 2021-09-29 MED ORDER — METOPROLOL TARTRATE 25 MG PO TABS
25.0000 mg | ORAL_TABLET | Freq: Two times a day (BID) | ORAL | Status: DC
  Administered 2021-09-29 – 2021-10-01 (×4): 25 mg
  Filled 2021-09-29 (×4): qty 1

## 2021-09-29 MED ORDER — ALBUTEROL SULFATE (2.5 MG/3ML) 0.083% IN NEBU
2.5000 mg | INHALATION_SOLUTION | Freq: Once | RESPIRATORY_TRACT | Status: AC
  Administered 2021-09-29: 12:00:00 2.5 mg via RESPIRATORY_TRACT
  Filled 2021-09-29: qty 3

## 2021-09-29 MED ORDER — MORPHINE SULFATE (PF) 4 MG/ML IV SOLN
4.0000 mg | Freq: Once | INTRAVENOUS | Status: AC
  Administered 2021-09-29: 14:00:00 4 mg via INTRAVENOUS
  Filled 2021-09-29: qty 1

## 2021-09-29 MED ORDER — FOLIC ACID 1 MG PO TABS
1.0000 mg | ORAL_TABLET | Freq: Every day | ORAL | Status: DC
  Administered 2021-09-29 – 2021-10-16 (×18): 1 mg
  Filled 2021-09-29 (×18): qty 1

## 2021-09-29 MED ORDER — PROCHLORPERAZINE MALEATE 10 MG PO TABS
10.0000 mg | ORAL_TABLET | Freq: Four times a day (QID) | ORAL | Status: DC | PRN
  Administered 2021-10-10 – 2021-10-17 (×11): 10 mg
  Filled 2021-09-29 (×13): qty 1

## 2021-09-29 MED ORDER — MIDODRINE HCL 5 MG PO TABS
2.5000 mg | ORAL_TABLET | Freq: Three times a day (TID) | ORAL | Status: DC
  Administered 2021-09-29 – 2021-10-16 (×50): 2.5 mg
  Filled 2021-09-29 (×50): qty 1

## 2021-09-29 MED ORDER — SODIUM CHLORIDE (PF) 0.9 % IJ SOLN
INTRAMUSCULAR | Status: AC
  Filled 2021-09-29: qty 50

## 2021-09-29 MED ORDER — MIRTAZAPINE 15 MG PO TBDP
15.0000 mg | ORAL_TABLET | Freq: Every day | ORAL | Status: DC
  Administered 2021-09-29 – 2021-10-15 (×17): 15 mg
  Filled 2021-09-29 (×19): qty 1
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
## 2021-09-29 NOTE — Progress Notes (Signed)
Pt arrived to 1416 with chest tube in place. Alert and oriented x4, daughter at bedside. See assessment. Oriented to unit and staff. Coolidge Breeze, RN 09/29/2021

## 2021-09-29 NOTE — ED Notes (Signed)
Pt continues to remove monitoring equipment despite encouragement/rationale of keeping it on.

## 2021-09-29 NOTE — Consult Note (Addendum)
NAME:  Kim Ross, MRN:  [id number], DOB:  22-Apr-1956, LOS: 0 ADMISSION DATE:  09/29/2021, CONSULTATION DATE:  09/29/21 REFERRING MD:  Regenia Skeeter CHIEF COMPLAINT:  Dyspnea   History of Present Illness:  Kim Ross is a 65 y.o. 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  She is under the care of Dr. Julien Nordmann and is receiving palliative systemic chemo and is s/p 3 cycles. She was just discharged to [facility] 12/26 after recent admission (11/22 through 12/26) for A.fib RVR and large left sided pleural effusion.  She underwent thora 11/23 with 317ml fluid removed which was concerning for malignancy. During her admission she was also found to have LLE DVT for which she was started on Lovenox, last dose 12/26.  12/27, she had dyspnea and tachycardia.  Due to no improvement after Amiodarone and Metoprolol, she was sent to the ED for further evaluation.   In ED, she was found to have complete opacification of left hemithorax 2/2 large effusion.  PCCM asked to see in consultation and for consideration pleur-x.  Pertinent  Medical History:  has Tobacco use disorder; Irritable bowel syndrome; Generalized anxiety disorder; Vertigo, intermittent; History of recurrent UTIs; Hematuria; UTI (urinary tract infection); Seasonal allergies; Hyperlipidemia; Onychomycosis of left great toe; Dry skin; Prediabetes; Stress at home; Psychophysiological insomnia; Hyperkeratosis of nail; Fatigue; Dyspnea; Lung mass; SIRS (systemic inflammatory response syndrome) ([place]); Diabetes ([place]); Tobacco abuse; Lobar pneumonia ([place]); Malnutrition of moderate degree; Lung cancer ([place]); Cancer related pain; Adenocarcinoma of left lung, stage 4 ([place]); Weight loss; Brain metastasis ([place]); Atrial fibrillation with RVR ([place]); Pleural effusion; A-fib ([place]); Protein-calorie malnutrition, severe; Pressure injury of skin; Thrombocytopenia ([place]); and  Chemotherapy-induced neutropenia ([place]) on their problem list.  Significant Hospital Events: Including procedures, antibiotic start and stop dates in addition to other pertinent events   12/26 discharge to [facility]. 12/27 readmit with dyspnea 2/2 large recurrent pleural effusion.  Interim History / Subjective:  Comfortable.  Last dose Lovenox 12/26. She is agreeable to Pleur-x.  Objective:  Blood pressure 116/75, pulse 97, temperature 97.6 F (36.4 C), temperature source Axillary, resp. rate (!) 21, SpO2 100 %.       No intake or output data in the 24 hours ending 09/29/21 1409 There were no vitals filed for this visit.  Examination: General: Adult female, chronically ill appearing, cachectic, resting in bed, in NAD. Neuro: A&O x 3, no deficits. HEENT: [place]/AT. Sclerae anicteric. EOMI. Cardiovascular: IRIR, no M/R/G.  Lungs: Respirations unlabored.  Diminished throughout left hemithorax. Abdomen: BS hypoactive, soft, NT/ND.  Musculoskeletal: No gross deformities, no edema.  Skin: Intact, warm, no rashes.  Labs/imaging personally reviewed:  CXR 12/27 > complete opacification left hemithorax.  Assessment & Plan:   Recurrent left sided malignant pleural effusion - last thoracentesis 11/23 with 366ml fluid removed where cytology was concerning for malignancy.  Ultimately given her hx of stage IV NSCLC, she will continue to have recurrence of her effusion requiring frequent drainage to hopefully avoid repeated ED visits / hospital admissions.  - Place pleur-x today, pt and her daughter are agreeable.  - I called [facility] to confirm that they can manage Pleur-x which they confirmed they can. Last dose Lovenox was confirmed 12/26. - Would try drainage daily for now until drainage is < 100cc then can move to every other day.  Acute on chronic hypoxic respiratory failure - 2/2 above + deconditioning + atx. - Continue supplemental  O2 as needed to maintain SpO2 > 90%. - Mobilize as  able. - Pleur-x as above.  Stage IV NSCLC on chemo. - F/u with oncology as outpatient.  LLE DVT - on Lovenox. - Hold Lovenox for today, can resume tomorrow 12/28.   Best practice (evaluated daily):  Per primary team.  Labs   CBC: Recent Labs  Lab 09/24/21 0500 09/25/21 0527 09/27/21 0546 09/28/21 0434 09/29/21 1247  WBC 27.4* 32.1* 32.2* 35.0* 33.9*  NEUTROABS  --   --   --   --  PENDING  HGB 7.9* 8.0* 7.6* 7.9* 8.2*  HCT 25.4* 25.3* 24.4* 25.0* 27.0*  MCV 93.7 92.7 94.2 94.0 97.1  PLT 448* 494* 559* 550* 679*    Basic Metabolic Panel: Recent Labs  Lab 09/23/21 0514 09/24/21 0500 09/25/21 0527 09/27/21 0546 09/28/21 0434 09/29/21 1247  NA 135  --  134* 134*  --  136  K 4.1  --  4.7 4.9  --  4.7  CL 101  --  101 98  --  99  CO2 29  --  27 30  --  29  GLUCOSE 154*  --  100* 125*  --  182*  BUN 10  --  10 12  --  17  CREATININE 0.30*  --  0.32* 0.31*  --  0.42*  CALCIUM 7.4*  --  7.9* 7.7*  --  8.1*  MG 1.6* 1.8 1.2* 1.6* 1.7  --   PHOS 2.4* 2.0* 1.8* 3.6  --   --    GFR: Estimated Creatinine Clearance: 64.8 mL/min (A) (by C-G formula based on SCr of 0.42 mg/dL (L)). Recent Labs  Lab 09/25/21 0527 09/27/21 0546 09/28/21 0434 09/29/21 1247  WBC 32.1* 32.2* 35.0* 33.9*    Liver Function Tests: Recent Labs  Lab 09/29/21 1247  AST 31  ALT 27  ALKPHOS 311*  BILITOT 0.5  PROT 6.4*  ALBUMIN 1.7*   No results for input(s): LIPASE, AMYLASE in the last 168 hours. No results for input(s): AMMONIA in the last 168 hours.  ABG    Component Value Date/Time   TCO2 26 10/11/2008 1345     Coagulation Profile: No results for input(s): INR, PROTIME in the last 168 hours.  Cardiac Enzymes: No results for input(s): CKTOTAL, CKMB, CKMBINDEX, TROPONINI in the last 168 hours.  HbA1C: HbA1c, POC (controlled diabetic range)  Date/Time Value Ref Range Status  04/29/2021 09:55 AM 6.3 0.0 - 7.0 % Final   Hgb A1c MFr Bld  Date/Time Value Ref Range Status   06/15/2021 01:17 AM 6.3 (H) 4.8 - 5.6 % Final    Comment:    (NOTE) Pre diabetes:          5.7%-6.4%  Diabetes:              >6.4%  Glycemic control for   <7.0% adults with diabetes   06/14/2021 11:10 PM 6.4 (H) 4.8 - 5.6 % Final    Comment:    (NOTE) Pre diabetes:          5.7%-6.4%  Diabetes:              >6.4%  Glycemic control for   <7.0% adults with diabetes     CBG: Recent Labs  Lab 09/28/21 0009 09/28/21 0411 09/28/21 0732 09/28/21 1106 09/28/21 1644  GLUCAP 84 124* 123* 86 87    Review of Systems:   All negative; except for those that are bolded, which indicate positives.  Constitutional: weight loss,  weight gain, night sweats, fevers, chills, fatigue, weakness.  HEENT: headaches, sore throat, sneezing, nasal congestion, post nasal drip, difficulty swallowing, tooth/dental problems, visual complaints, visual changes, ear aches. Neuro: difficulty with speech, weakness, numbness, ataxia. CV:  chest pain, orthopnea, PND, swelling in lower extremities, dizziness, palpitations, syncope.  Resp: cough, hemoptysis, dyspnea, wheezing. GI: heartburn, indigestion, abdominal pain, nausea, vomiting, diarrhea, constipation, change in bowel habits, loss of appetite, hematemesis, melena, hematochezia.  GU: dysuria, change in color of urine, urgency or frequency, flank pain, hematuria. MSK: joint pain or swelling, decreased range of motion. Psych: change in mood or affect, depression, anxiety, suicidal ideations, homicidal ideations. Skin: rash, itching, bruising.   Past Medical History:  She,  has a past medical history of Anxiety and depression, Aspiration pneumonitis ([place]), Bladder outlet obstruction, Cocaine abuse ([place]), Depression, Esophageal stenosis, GERD (gastroesophageal reflux disease), Goiter, History of DVT (deep vein thrombosis), History of radiation therapy, History of recurrent UTIs, Malignant pleural effusion, Mixed hyperlipidemia, Non-small cell lung cancer  metastatic to brain [facility]), Paroxysmal atrial fibrillation ([place]), Pneumonia, Protein calorie malnutrition ([place]), and Type 2 diabetes mellitus ([place]).   Surgical History:   Past Surgical History:  Procedure Laterality Date   ABDOMINAL HYSTERECTOMY     BALLOON DILATION N/A 09/01/2021   Procedure: BALLOON DILATION;  Surgeon: Clarene Essex, MD;  Location: [facility] ENDOSCOPY;  Service: Endoscopy;  Laterality: N/A;   BRONCHIAL NEEDLE ASPIRATION BIOPSY  06/17/2021   Procedure: BRONCHIAL NEEDLE ASPIRATION BIOPSIES;  Surgeon: Maryjane Hurter, MD;  Location: [facility];  Service: Pulmonary;;   ENDOBRONCHIAL ULTRASOUND Left 06/17/2021   Procedure: ENDOBRONCHIAL ULTRASOUND;  Surgeon: Maryjane Hurter, MD;  Location: [facility];  Service: Pulmonary;  Laterality: Left;   ESOPHAGOGASTRODUODENOSCOPY N/A 09/01/2021   Procedure: ESOPHAGOGASTRODUODENOSCOPY (EGD);  Surgeon: Clarene Essex, MD;  Location: [facility];  Service: Endoscopy;  Laterality: N/A;   HEMORRHOID SURGERY     IR GASTROSTOMY TUBE MOD SED  09/14/2021   VIDEO BRONCHOSCOPY  06/17/2021   Procedure: VIDEO BRONCHOSCOPY;  Surgeon: Maryjane Hurter, MD;  Location: [facility];  Service: Pulmonary;;     Social History:   reports that she has been smoking cigarettes. She has been smoking an average of .25 packs per day. She has never used smokeless tobacco. She reports that she does not currently use alcohol after a past usage of about 1.0 standard drink per week. She reports that she does not use drugs.   Family History:  Her family history includes Asthma in her mother; Cancer (age of onset: 43) in her sister.   Allergies Allergies  Allergen Reactions   Ibuprofen Nausea And Vomiting   Trazodone And Nefazodone     AM Dizziness & Drowsiness      Home Medications  Prior to Admission medications   Medication Sig Start Date End Date Taking? Authorizing Provider  amiodarone (PACERONE) 200 MG tablet Place 1 tablet (200 mg total) into feeding tube  daily. 09/28/21   Shawna Clamp, MD  atorvastatin (LIPITOR) 40 MG tablet TAKE 1 TABLET BY MOUTH EVERY DAY Patient taking differently: Take 40 mg by mouth daily. 02/03/21   Milus Banister C, DO  enoxaparin (LOVENOX) 60 MG/0.6ML injection Inject 0.6 mLs (60 mg total) into the skin every 12 (twelve) hours. 09/28/21   Shawna Clamp, MD  feeding supplement (ENSURE ENLIVE / ENSURE PLUS) LIQD Take 237 mLs by mouth 2 (two) times daily between meals. 06/18/21   Mariel Aloe, MD  fentaNYL (DURAGESIC) 50 MCG/HR Place 1 patch onto the skin every  3 (three) days. 09/28/21   Shawna Clamp, MD  fluticasone (FLONASE) 50 MCG/ACT nasal spray Place 1 spray into both nostrils daily. 1 spray in each nostril every day 04/29/21   Gladys Damme, MD  folic acid (FOLVITE) 1 MG tablet Take 1 tablet (1 mg total) by mouth daily. 08/26/21   Curt Bears, MD  HYDROcodone-acetaminophen (NORCO/VICODIN) 5-325 MG tablet Place 1 tablet into feeding tube every 4 (four) hours as needed for up to 3 days for moderate pain. 09/28/21 10/01/21  Shawna Clamp, MD  meclizine (ANTIVERT) 25 MG tablet TAKE 1 TABLET BY MOUTH TWICE A DAY 10/09/20   Milus Banister C, DO  metFORMIN (GLUCOPHAGE) 500 MG tablet TAKE 1 TABLET BY MOUTH 2 TIMES DAILY WITH A MEAL. Patient taking differently: Take 250 mg by mouth daily with breakfast. 05/20/21   Erskine Emery, MD  metoprolol tartrate (LOPRESSOR) 25 MG tablet Place 0.5 tablets (12.5 mg total) into feeding tube 2 (two) times daily. 09/28/21   Shawna Clamp, MD  midodrine (PROAMATINE) 2.5 MG tablet Place 1 tablet (2.5 mg total) into feeding tube 3 (three) times daily with meals. 09/28/21   Shawna Clamp, MD  mirtazapine (REMERON SOL-TAB) 15 MG disintegrating tablet Take 1 tablet (15 mg total) by mouth at bedtime. 09/28/21   Shawna Clamp, MD  Multiple Vitamins-Minerals (MULTIVITAMIN WITH MINERALS) tablet Take 1 tablet by mouth daily. Patient taking differently: Take 1 tablet by mouth every other  day. 04/27/13   Olam Idler, MD  prochlorperazine (COMPAZINE) 10 MG tablet Take 1 tablet (10 mg total) by mouth every 6 (six) hours as needed. 07/08/21   Heilingoetter, Cassandra L, PA-C  sertraline (ZOLOFT) 50 MG tablet TAKE 1 TABLET BY MOUTH EVERY DAY Patient taking differently: Take 50 mg by mouth daily. 11/30/20   Daisy Floro, DO  tamsulosin (FLOMAX) 0.4 MG CAPS capsule Take 1 capsule (0.4 mg total) by mouth daily after supper. 09/28/21   Shawna Clamp, MD  triamcinolone ointment (KENALOG) 0.5 % APPLY 1 APPLICATION TOPICALLY 2 (TWO) TIMES DAILY. FOR MODERATE TO SEVERE ECZEMA. DO NOT USE FOR MORE THAN 1 WEEK AT A TIME. Patient taking differently: 1 application See admin instructions. Apply twice daily for moderate to severe eczema.  Do not use for more than 1 week at a time. 08/10/21   Eppie Gibson, MD     Montey Hora, [place] For pager details, please see AMION or use Epic chat  After 1900, please call [facility] for cross coverage needs 09/29/2021, 2:09 PM

## 2021-09-29 NOTE — H&P (Signed)
History and Physical    MAILYNN EVERLY FVC:[id number] DOB: [birth year] DOA: 09/29/2021  PCP: Erskine Emery, MD  Patient coming from: [facility].  I have personally briefly reviewed patient's old medical records in [place]  Chief Complaint: Shortness of breath.  HPI: Kim Ross is a 65 y.o. 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  She was placed on NRB oxygen, given albuterol neb and 125 mg of Solu-Medrol in route to the hospital.  Denied fever, rhinorrhea, but has chills and mild sore throat.  She has not been wheezing and coughing whitish sputum for several days.  Denied hemoptysis.  Positive pleuritic chest pain, orthopnea and palpitations, but no PND.  Positive left lower extremity edema.  Her appetite is decreased, she has had some mild abdominal pain, nausea and constipation.  No recent emesis, diarrhea, melena hematochezia.  Denied flank pain, dysuria, frequency or hematuria.  No polyuria, polydipsia, polyphagia or blurred vision.  ED Course: Initial vital signs were temperature 97.6 F, pulse 108, respirations 27, BP 138/105 mmHg O2 sat 100% on NRB mask at 10 LPM.  He is currently satting 100% on nasal cannula at 3 LPM.  He was given a DuoNeb and then a 2.5 mg albuterol neb.  I added morphine 4 mg plus ondansetron 4 mg IVP  x1.  Lab work: Coronavirus and influenza PCR was negative.  CBC showed a white count of 33.9, hemoglobin 8.2 g/dL platelets 679.  Troponin was normal.  BNP was 255.9 pg/mL.  CMP showed normal electrolytes when calcium corrected to albumin level.  Normal renal function.  Glucose 182 mg/dL.  Albumin was 1.7 and total protein 6.4 g/dL.  Alkaline phosphatase 311 units/L.  Transaminases and bilirubin level were normal.  Imaging: A portable 1 view chest radiograph showed complete opacification of the left hemithorax with rightward shift of the mediastinal structures, likely due to large pleural effusion, component of the opacification may also be due to interval enlargement of known left lower lobe mass.  Please see image and full radiology report for further details.  Review of Systems: As per HPI otherwise all other systems reviewed and are negative.  Past Medical History:  Diagnosis Date   Anxiety and depression    Aspiration pneumonitis (HCC)    Bladder outlet obstruction    Due to spinal trauma   Cocaine abuse ([place])    Depression    Esophageal stenosis    GERD (gastroesophageal reflux disease)    Goiter    History of DVT (deep vein thrombosis)    History of radiation therapy    Brain, left chest 07/13/21-08/04/21- Dr. 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   Paroxysmal atrial fibrillation ([place])    Pneumonia    Protein calorie malnutrition (HCC)    Type  2 diabetes mellitus ([place])     Past Surgical History:  Procedure Laterality Date   ABDOMINAL HYSTERECTOMY     BALLOON DILATION N/A 09/01/2021   Procedure: BALLOON DILATION;  Surgeon: Clarene Essex, MD;  Location: [facility] ENDOSCOPY;  Service: Endoscopy;  Laterality: N/A;   BRONCHIAL NEEDLE ASPIRATION BIOPSY  06/17/2021   Procedure: BRONCHIAL NEEDLE ASPIRATION BIOPSIES;  Surgeon: Maryjane Hurter, MD;  Location: [facility];  Service: Pulmonary;;   ENDOBRONCHIAL ULTRASOUND Left 06/17/2021   Procedure: ENDOBRONCHIAL ULTRASOUND;  Surgeon: Maryjane Hurter, MD;  Location: [facility] ENDOSCOPY;  Service: Pulmonary;  Laterality: Left;   ESOPHAGOGASTRODUODENOSCOPY N/A 09/01/2021   Procedure: ESOPHAGOGASTRODUODENOSCOPY (EGD);  Surgeon: Clarene Essex, MD;  Location: [facility];  Service: Endoscopy;  Laterality: N/A;   HEMORRHOID SURGERY     IR GASTROSTOMY TUBE MOD SED  09/14/2021   VIDEO BRONCHOSCOPY  06/17/2021   Procedure: VIDEO BRONCHOSCOPY;  Surgeon: Maryjane Hurter, MD;  Location: [facility] ENDOSCOPY;  Service: Pulmonary;;   Social History  reports that she has been smoking cigarettes. She has been smoking an average of .25 packs per day. She has never used smokeless tobacco. She reports that she does not currently use alcohol after a past usage of about 1.0 standard drink per week. She reports that she does not use drugs.  Allergies  Allergen Reactions   Ibuprofen Nausea And Vomiting   Trazodone And Nefazodone     AM Dizziness & Drowsiness    Family History  Problem Relation Age of Onset   Asthma Mother    Cancer Sister 20       Pancreatic   Prior to Admission medications   Medication Sig Start Date End Date Taking? Authorizing Provider  amiodarone (PACERONE) 200 MG tablet Place 1 tablet (200 mg total) into feeding tube daily. 09/28/21  Yes Shawna Clamp, MD  atorvastatin (LIPITOR) 40 MG tablet TAKE 1 TABLET BY MOUTH EVERY DAY Patient taking differently: Place 40 mg into feeding tube daily. 02/03/21  Yes Milus Banister C, DO  fentaNYL (DURAGESIC) 50 MCG/HR Place 1 patch onto the skin every 3 (three) days. 09/28/21  Yes Shawna Clamp, MD  fluticasone (FLONASE) 50 MCG/ACT nasal spray Place 1 spray into both nostrils daily. 1 spray in each nostril every day Patient taking differently: Place 1 spray into both nostrils daily. 04/29/21  Yes Gladys Damme, MD  folic acid (FOLVITE) 1 MG tablet Take 1 tablet (1  mg total) by mouth daily. Patient taking differently: Place 1 mg into feeding tube daily. 08/26/21  Yes Curt Bears, MD  metFORMIN (GLUCOPHAGE) 500 MG tablet TAKE 1 TABLET BY MOUTH 2 TIMES DAILY WITH A MEAL. Patient taking differently: Place 250 mg into feeding tube daily with breakfast. 05/20/21  Yes Zigmund Daniel, Allee, MD  midodrine (PROAMATINE) 2.5 MG tablet Place 1 tablet (2.5 mg total) into feeding tube 3 (three) times daily with meals. 09/28/21  Yes Shawna Clamp, MD  mirtazapine (REMERON SOL-TAB) 15 MG disintegrating tablet Take 1 tablet (15 mg total) by mouth at bedtime. 09/28/21  Yes Shawna Clamp, MD  Multiple Vitamins-Minerals (MULTIVITAMIN WITH MINERALS) tablet Take 1 tablet by mouth daily. Patient taking differently: Place 1 tablet into feeding tube daily. 04/27/13  Yes Olam Idler, MD  sertraline (ZOLOFT) 50 MG tablet TAKE 1 TABLET BY MOUTH EVERY DAY Patient taking differently: Place 50 mg into feeding tube daily. 11/30/20  Yes Milus Banister C, DO  enoxaparin (LOVENOX) 60 MG/0.6ML injection Inject 0.6 mLs (60 mg total) into the skin every 12 (twelve)  hours. 09/28/21   Shawna Clamp, MD  feeding supplement (ENSURE ENLIVE / ENSURE PLUS) LIQD Take 237 mLs by mouth 2 (two) times daily between meals. 06/18/21   Mariel Aloe, MD  HYDROcodone-acetaminophen (NORCO/VICODIN) 5-325 MG tablet Place 1 tablet into feeding tube every 4 (four) hours as needed for up to 3 days for moderate pain. 09/28/21 10/01/21  Shawna Clamp, MD  meclizine (ANTIVERT) 25 MG tablet TAKE 1 TABLET BY MOUTH TWICE A DAY 10/09/20   Milus Banister C, DO  metoprolol tartrate (LOPRESSOR) 25 MG tablet Place 0.5 tablets (12.5 mg total) into feeding tube 2 (two) times daily. 09/28/21   Shawna Clamp, MD  prochlorperazine (COMPAZINE) 10 MG tablet Take 1 tablet (10 mg total) by mouth every 6 (six) hours as needed. 07/08/21   Heilingoetter, Cassandra L, PA-C  tamsulosin (FLOMAX) 0.4 MG CAPS capsule Take 1 capsule (0.4 mg  total) by mouth daily after supper. 09/28/21   Shawna Clamp, MD  triamcinolone ointment (KENALOG) 0.5 % APPLY 1 APPLICATION TOPICALLY 2 (TWO) TIMES DAILY. FOR MODERATE TO SEVERE ECZEMA. DO NOT USE FOR MORE THAN 1 WEEK AT A TIME. Patient taking differently: 1 application See admin instructions. Apply twice daily for moderate to severe eczema.  Do not use for more than 1 week at a time. 08/10/21   Eppie Gibson, MD   Physical Exam: Vitals:   09/29/21 1134 09/29/21 1223 09/29/21 1304 09/29/21 1336  BP: (!) 138/105 (!) 120/94 99/67 116/75  Pulse: (!) 108 (!) 106 100 97  Resp: (!) 27 20 19  (!) 21  Temp: 97.6 F (36.4 C)     TempSrc: Axillary     SpO2: 100% 97% 98% 100%   Constitutional: Under nourished/cachetic.  NAD, calm, comfortable Eyes: PERRL, lids and conjunctivae normal.  Mild bilateral scleral injection. ENMT: Mucous membranes are moist. Posterior pharynx clear of any exudate or lesions. Neck: normal, supple, no masses, no thyromegaly Respiratory: Tachypneic in mid to high 20s.  Absent breath sounds on left lung field.  No wheezing.  No accessory muscle use.  Cardiovascular: Sinus tachycardia in the low 100s, no murmurs / rubs / gallops. No extremity edema. 2+ pedal pulses. No carotid bruits.  Abdomen: positive PEG tube, no distention, soft, no tenderness, no masses palpated. No hepatosplenomegaly. Bowel sounds hypoactive. Musculoskeletal: Moderate generalized weakness.  No clubbing / cyanosis. Good ROM, no contractures. Normal muscle tone.  Skin: Stage 2 pressure injury of sacral area. Neurologic: CN 2-12 grossly intact. Sensation intact, DTR normal. Strength 5/5 in all 4.  Psychiatric: Normal judgment and insight. Alert and oriented x 3. Normal mood.   Labs on Admission: I have personally reviewed following labs and imaging studies  CBC: Recent Labs  Lab 09/24/21 0500 09/25/21 0527 09/27/21 0546 09/28/21 0434 09/29/21 1247  WBC 27.4* 32.1* 32.2* 35.0* 33.9*  NEUTROABS   --   --   --   --  PENDING  HGB 7.9* 8.0* 7.6* 7.9* 8.2*  HCT 25.4* 25.3* 24.4* 25.0* 27.0*  MCV 93.7 92.7 94.2 94.0 97.1  PLT 448* 494* 559* 550* 679*    Basic Metabolic Panel: Recent Labs  Lab 09/23/21 0514 09/24/21 0500 09/25/21 0527 09/27/21 0546 09/28/21 0434 09/29/21 1247  NA 135  --  134* 134*  --  136  K 4.1  --  4.7 4.9  --  4.7  CL 101  --  101 98  --  99  CO2 29  --  27 30  --  29  GLUCOSE 154*  --  100* 125*  --  182*  BUN 10  --  10 12  --  17  CREATININE 0.30*  --  0.32* 0.31*  --  0.42*  CALCIUM 7.4*  --  7.9* 7.7*  --  8.1*  MG 1.6* 1.8 1.2* 1.6* 1.7  --   PHOS 2.4* 2.0* 1.8* 3.6  --   --     GFR: Estimated Creatinine Clearance: 64.8 mL/min (A) (by C-G formula based on SCr of 0.42 mg/dL (L)).  Liver Function Tests: Recent Labs  Lab 09/29/21 1247  AST 31  ALT 27  ALKPHOS 311*  BILITOT 0.5  PROT 6.4*  ALBUMIN 1.7*   Radiological Exams on Admission: DG Chest Portable 1 View  Result Date: 09/29/2021 CLINICAL DATA:  Acute dyspnea EXAM: PORTABLE CHEST 1 VIEW COMPARISON:  Chest x-ray dated August 28, 2021 FINDINGS: Complete opacification of the left hemithorax with rightward shift of the mediastinal structures. Cardiac and mediastinal structures are obscured and not well evaluated. No focal consolidation of the right hemithorax. No evidence of pneumothorax. IMPRESSION: Complete opacification of the left hemithorax with rightward shift of the mediastinal structures, likely due to large pleural effusion, a component of the opacification may also be due to interval enlargement of known left lower lobe mass. Electronically Signed   By: Yetta Glassman M.D.   On: 09/29/2021 13:14    08/26/2021 DVT study RIGHT:  - No evidence of common femoral vein obstruction.     LEFT:  - Findings consistent with acute deep vein thrombosis involving the left  common femoral vein, SF junction, left femoral vein, left proximal  profunda vein, left popliteal vein, left  posterior tibial veins, and left  peroneal veins.  - No cystic structure found in the popliteal fossa.   08/26/2021 echocardiogram IMPRESSIONS:     1. Left ventricular ejection fraction, by estimation, is 55 to 60%. The  left ventricle has normal function. The left ventricle has no regional  wall motion abnormalities.   2. Right ventricular systolic function is normal. The right ventricular  size is normal. There is normal pulmonary artery systolic pressure. The  estimated right ventricular systolic pressure is 21.1 mmHg.   3. The mitral valve is normal in structure. Trivial mitral valve  regurgitation. No evidence of mitral stenosis.   4. The aortic valve was not well visualized. Aortic valve regurgitation  is not visualized. No aortic stenosis is present.   5. The inferior vena cava is normal in size with greater than 50%  respiratory variability, suggesting right atrial pressure of 3 mmHg.   EKG: Independently reviewed.  Vent. rate 106 BPM PR interval 112 ms QRS duration 75 ms QT/QTcB 385/512 ms P-R-T axes 51 54 83 Sinus tachycardia Low voltage, precordial leads Nonspecific T abnormalities, lateral leads Prolonged QT interval  Assessment/Plan Principal Problem:   Pleural effusion on left In the setting of:   Adenocarcinoma of left lung, stage 4 (HCC) Observation/PCU. Continue supplemental oxygen. Bronchodilators as needed. PCCM consult appreciated. Pleurx tunneled pleural catheter placed. PCCM will continue to follow. Analgesics/antiemetics as needed.  Active Problems   Leukocytosis Due to recent Granix administration.    Anemia of chronic disease Monitor hematocrit and hemoglobin.    Left leg DVT (HCC) Continue therapeutic dose of Lovenox.    Generalized anxiety disorder Continue mirtazapine 15 mg p.o. bedtime. Continue sertraline 50 mg p.o. daily.    Hyperlipidemia Hold atorvastatin.    Type 2 diabetes mellitus (HCC) Continue metformin 250 mg p.o.  daily. CBG monitoring  with RI SS.    Paroxysmal A-fib (HCC) CHA2DS2-VASc score of 2.  Continue amiodarone, enoxaparin and metoprolol.    Protein-calorie malnutrition, severe Continue tube feeding. Consult nutritional services.    Pressure injury of skin Continue local care. Continue preventive measures.   DVT prophylaxis: On therapeutic dose enoxaparin. Code Status:   DNR. Family Communication:  Daughter was at bedside. Disposition Plan:   Patient is from:  Home.  Anticipated DC to:  Home.  Anticipated DC date:  10/01/2021.  Anticipated DC barriers: Clinical condition.  Consults called:   Admission status:  Observation/PCU.   Severity of Illness: High severity due to severe acute dyspnea in the setting of recurrent malignant left pleural effusion.  Reubin Milan MD [facility]  How to contact the [facility] Attending or Consulting provider [place] or covering provider during after hours [place], for this patient?   Check the care team in [facility] and look for a) attending/consulting [facility] provider listed and b) the [facility] team listed Log into [email or web address] and use [place]'s universal password to access. If you do not have the password, please contact the hospital operator. Locate the [facility] provider you are looking for under [facility] and page to a number that you can be directly reached. If you still have difficulty reaching the provider, please page the [facility] (Director on Call) for the Hospitalists listed on amion for assistance.  09/29/2021, 2:42 PM   This document was prepared using Dragon voice recognition software and may contain some unintended transcription errors.
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
## 2021-09-29 NOTE — ED Notes (Signed)
Delay on lab draw d/t limited access.  Hassell Done, NT to bedside to attempt.

## 2021-09-29 NOTE — Procedures (Signed)
PleurX Insertion Procedure Note  JAMELLE NOY  [id number]  07-26-1956  Date:09/29/21  Time:3:17 PM   Provider Performing:Xiadani Damman Shearon Stalls  Procedure: PleurX Tunneled Pleural Catheter Placement (98921)  Indication(s) Relief of dyspnea from recurrent effusion  Consent Risks of the procedure as well as the alternatives and risks of each were explained to the patient and/or caregiver.  Consent for the procedure was obtained.   Anesthesia Topical only with 1% lidocaine    Time Out Verified patient identification, verified procedure, site/side was marked, verified correct patient position, special equipment/implants available, medications/allergies/relevant history reviewed, required imaging and test results available.   Sterile Technique Maximal sterile technique including sterile barrier drape, hand hygiene, sterile gown, sterile gloves, mask, hair covering.    Procedure Description Ultrasound used to identify appropriate pleural anatomy for placement and overlying skin marked.  Area of drainage cleaned and draped in sterile fashion.   Lidocaine was used to anesthetize the skin and subcutaneous tissue.   1.5 cm incision made overlying fluid and another about 5 cm anterior to this along chest wall.  PleurX catheter inserted in usual sterile fashion using modified seldinger technique.  Interrupted silk sutures placed at catheter insertion and tunneling points which will be removed at later date.  PleurX catheter then hooked to suction.  After fluid aspirated, pleurX capped and sterile dressing applied.   Complications/Tolerance None; patient tolerated the procedure well. Chest X-ray is ordered to confirm no post-procedural complication.   EBL Minimal   Specimen(s) none    Montey Hora, PA - [facility] For pager details, please see AMION or use Epic chat  After 1900, please call [place] for cross coverage needs 09/29/2021, 3:17 PM

## 2021-09-29 NOTE — ED Notes (Signed)
Pt w CT at this time

## 2021-09-29 NOTE — ED Notes (Addendum)
Returned to pt's room to assist w/ bedpan. Pt states she didn't have a BM at this time. Removed bedpan from pt.

## 2021-09-29 NOTE — ED Notes (Signed)
Pt requested bedpan. Bedpan placed under pt at this time.

## 2021-09-30 ENCOUNTER — Observation Stay (HOSPITAL_COMMUNITY): Payer: 59

## 2021-09-30 ENCOUNTER — Inpatient Hospital Stay: Payer: 59

## 2021-09-30 DIAGNOSIS — E785 Hyperlipidemia, unspecified: Secondary | ICD-10-CM

## 2021-09-30 DIAGNOSIS — E782 Mixed hyperlipidemia: Secondary | ICD-10-CM | POA: Diagnosis present

## 2021-09-30 DIAGNOSIS — X58XXXS Exposure to other specified factors, sequela: Secondary | ICD-10-CM | POA: Diagnosis present

## 2021-09-30 DIAGNOSIS — Z681 Body mass index (BMI) 19 or less, adult: Secondary | ICD-10-CM | POA: Diagnosis not present

## 2021-09-30 DIAGNOSIS — D638 Anemia in other chronic diseases classified elsewhere: Secondary | ICD-10-CM | POA: Diagnosis not present

## 2021-09-30 DIAGNOSIS — I824Z2 Acute embolism and thrombosis of unspecified deep veins of left distal lower extremity: Secondary | ICD-10-CM

## 2021-09-30 DIAGNOSIS — C3492 Malignant neoplasm of unspecified part of left bronchus or lung: Secondary | ICD-10-CM

## 2021-09-30 DIAGNOSIS — J9621 Acute and chronic respiratory failure with hypoxia: Secondary | ICD-10-CM | POA: Diagnosis present

## 2021-09-30 DIAGNOSIS — G893 Neoplasm related pain (acute) (chronic): Secondary | ICD-10-CM | POA: Diagnosis present

## 2021-09-30 DIAGNOSIS — C7951 Secondary malignant neoplasm of bone: Secondary | ICD-10-CM | POA: Diagnosis present

## 2021-09-30 DIAGNOSIS — I82412 Acute embolism and thrombosis of left femoral vein: Secondary | ICD-10-CM | POA: Diagnosis present

## 2021-09-30 DIAGNOSIS — Z515 Encounter for palliative care: Secondary | ICD-10-CM | POA: Diagnosis not present

## 2021-09-30 DIAGNOSIS — R64 Cachexia: Secondary | ICD-10-CM | POA: Diagnosis present

## 2021-09-30 DIAGNOSIS — L89152 Pressure ulcer of sacral region, stage 2: Secondary | ICD-10-CM | POA: Diagnosis present

## 2021-09-30 DIAGNOSIS — Z20822 Contact with and (suspected) exposure to covid-19: Secondary | ICD-10-CM | POA: Diagnosis present

## 2021-09-30 DIAGNOSIS — C3432 Malignant neoplasm of lower lobe, left bronchus or lung: Secondary | ICD-10-CM | POA: Diagnosis present

## 2021-09-30 DIAGNOSIS — D6481 Anemia due to antineoplastic chemotherapy: Secondary | ICD-10-CM | POA: Diagnosis present

## 2021-09-30 DIAGNOSIS — I82442 Acute embolism and thrombosis of left tibial vein: Secondary | ICD-10-CM | POA: Diagnosis present

## 2021-09-30 DIAGNOSIS — E43 Unspecified severe protein-calorie malnutrition: Secondary | ICD-10-CM

## 2021-09-30 DIAGNOSIS — I480 Paroxysmal atrial fibrillation: Secondary | ICD-10-CM

## 2021-09-30 DIAGNOSIS — D630 Anemia in neoplastic disease: Secondary | ICD-10-CM | POA: Diagnosis present

## 2021-09-30 DIAGNOSIS — F411 Generalized anxiety disorder: Secondary | ICD-10-CM

## 2021-09-30 DIAGNOSIS — F32A Depression, unspecified: Secondary | ICD-10-CM | POA: Diagnosis present

## 2021-09-30 DIAGNOSIS — I82432 Acute embolism and thrombosis of left popliteal vein: Secondary | ICD-10-CM | POA: Diagnosis present

## 2021-09-30 DIAGNOSIS — I82452 Acute embolism and thrombosis of left peroneal vein: Secondary | ICD-10-CM | POA: Diagnosis present

## 2021-09-30 DIAGNOSIS — C7931 Secondary malignant neoplasm of brain: Secondary | ICD-10-CM | POA: Diagnosis present

## 2021-09-30 DIAGNOSIS — R54 Age-related physical debility: Secondary | ICD-10-CM | POA: Diagnosis present

## 2021-09-30 DIAGNOSIS — E119 Type 2 diabetes mellitus without complications: Secondary | ICD-10-CM | POA: Diagnosis present

## 2021-09-30 DIAGNOSIS — Z66 Do not resuscitate: Secondary | ICD-10-CM | POA: Diagnosis present

## 2021-09-30 DIAGNOSIS — J910 Malignant pleural effusion: Secondary | ICD-10-CM | POA: Diagnosis present

## 2021-09-30 DIAGNOSIS — J90 Pleural effusion, not elsewhere classified: Secondary | ICD-10-CM | POA: Diagnosis present

## 2021-09-30 DIAGNOSIS — Z7189 Other specified counseling: Secondary | ICD-10-CM | POA: Diagnosis not present

## 2021-09-30 LAB — CBC
HCT: 24.4 % — ABNORMAL LOW (ref 36.0–46.0)
Hemoglobin: 7.6 g/dL — ABNORMAL LOW (ref 12.0–15.0)
MCH: 29.8 pg (ref 26.0–34.0)
MCHC: 31.1 g/dL (ref 30.0–36.0)
MCV: 95.7 fL (ref 80.0–100.0)
Platelets: 662 10*3/uL — ABNORMAL HIGH (ref 150–400)
RBC: 2.55 MIL/uL — ABNORMAL LOW (ref 3.87–5.11)
RDW: 22.5 % — ABNORMAL HIGH (ref 11.5–15.5)
WBC: 35.8 10*3/uL — ABNORMAL HIGH (ref 4.0–10.5)
nRBC: 0.4 % — ABNORMAL HIGH (ref 0.0–0.2)

## 2021-09-30 LAB — COMPREHENSIVE METABOLIC PANEL
ALT: 34 U/L (ref 0–44)
AST: 50 U/L — ABNORMAL HIGH (ref 15–41)
Albumin: <1.5 g/dL — ABNORMAL LOW (ref 3.5–5.0)
Alkaline Phosphatase: 258 U/L — ABNORMAL HIGH (ref 38–126)
Anion gap: 7 (ref 5–15)
BUN: 17 mg/dL (ref 8–23)
CO2: 29 mmol/L (ref 22–32)
Calcium: 8.2 mg/dL — ABNORMAL LOW (ref 8.9–10.3)
Chloride: 103 mmol/L (ref 98–111)
Creatinine, Ser: <0.3 mg/dL — ABNORMAL LOW (ref 0.44–1.00)
Glucose, Bld: 136 mg/dL — ABNORMAL HIGH (ref 70–99)
Potassium: 4.4 mmol/L (ref 3.5–5.1)
Sodium: 139 mmol/L (ref 135–145)
Total Bilirubin: 0.2 mg/dL — ABNORMAL LOW (ref 0.3–1.2)
Total Protein: 5.4 g/dL — ABNORMAL LOW (ref 6.5–8.1)

## 2021-09-30 LAB — HEMOGLOBIN A1C
Hgb A1c MFr Bld: 6.4 % — ABNORMAL HIGH (ref 4.8–5.6)
Mean Plasma Glucose: 136.98 mg/dL

## 2021-09-30 LAB — GLUCOSE, CAPILLARY
Glucose-Capillary: 112 mg/dL — ABNORMAL HIGH (ref 70–99)
Glucose-Capillary: 121 mg/dL — ABNORMAL HIGH (ref 70–99)
Glucose-Capillary: 94 mg/dL (ref 70–99)

## 2021-09-30 MED ORDER — INSULIN ASPART 100 UNIT/ML IJ SOLN
0.0000 [IU] | Freq: Four times a day (QID) | INTRAMUSCULAR | Status: DC
  Administered 2021-10-01 – 2021-10-05 (×10): 1 [IU] via SUBCUTANEOUS
  Administered 2021-10-06: 2 [IU] via SUBCUTANEOUS
  Administered 2021-10-07: 1 [IU] via SUBCUTANEOUS
  Administered 2021-10-07 (×2): 2 [IU] via SUBCUTANEOUS
  Administered 2021-10-08 (×2): 1 [IU] via SUBCUTANEOUS
  Administered 2021-10-08: 2 [IU] via SUBCUTANEOUS
  Administered 2021-10-08 – 2021-10-09 (×2): 1 [IU] via SUBCUTANEOUS
  Administered 2021-10-09: 2 [IU] via SUBCUTANEOUS
  Administered 2021-10-09 – 2021-10-16 (×8): 1 [IU] via SUBCUTANEOUS

## 2021-09-30 MED ORDER — CHLORHEXIDINE GLUCONATE CLOTH 2 % EX PADS
6.0000 | MEDICATED_PAD | Freq: Every day | CUTANEOUS | Status: DC
  Administered 2021-09-30 – 2021-10-20 (×19): 6 via TOPICAL

## 2021-09-30 MED ORDER — OSMOLITE 1.2 CAL PO LIQD: 1000.0000 mL | ORAL | Status: DC

## 2021-09-30 MED ORDER — INSULIN ASPART 100 UNIT/ML IJ SOLN: 0.0000 [IU] | Freq: Three times a day (TID) | INTRAMUSCULAR | Status: DC

## 2021-09-30 MED ORDER — ENOXAPARIN SODIUM 60 MG/0.6ML IJ SOSY
60.0000 mg | PREFILLED_SYRINGE | Freq: Two times a day (BID) | INTRAMUSCULAR | Status: DC
Start: 2021-09-30 — End: 2021-10-09
  Administered 2021-09-30 – 2021-10-09 (×19): 60 mg via SUBCUTANEOUS
  Filled 2021-09-30 (×19): qty 0.6

## 2021-09-30 MED ORDER — ADULT MULTIVITAMIN W/MINERALS CH
1.0000 | ORAL_TABLET | Freq: Every day | ORAL | Status: DC
  Administered 2021-09-30 – 2021-10-01 (×2): 1
  Filled 2021-09-30 (×2): qty 1

## 2021-09-30 MED ORDER — INSULIN ASPART 100 UNIT/ML IJ SOLN: 0.0000 [IU] | Freq: Every day | INTRAMUSCULAR | Status: DC

## 2021-09-30 NOTE — Plan of Care (Signed)
°  Problem: Education: Goal: Knowledge of General Education information will improve Description: Including pain rating scale, medication(s)/side effects and non-pharmacologic comfort measures Outcome: Progressing   Problem: Health Behavior/Discharge Planning: Goal: Ability to manage health-related needs will improve Outcome: Progressing   Problem: Clinical Measurements: Goal: Ability to maintain clinical measurements within normal limits will improve Outcome: Progressing Goal: Will remain free from infection Outcome: Progressing Goal: Respiratory complications will improve Outcome: Progressing

## 2021-09-30 NOTE — Progress Notes (Signed)
Initial Nutrition Assessment  DOCUMENTATION CODES:   Severe malnutrition in context of chronic illness  INTERVENTION:   -Continue Osmolite 1.5 @ 60 ml/hr via G-tube -Provides 2160 kcals, 90g protein and 1097 ml H2O -Free water recommendation: 150 ml every 6 hours (600 ml)  -Multivitamin with minerals daily via tube  NUTRITION DIAGNOSIS:   Severe Malnutrition related to chronic illness, cancer and cancer related treatments as evidenced by severe fat depletion, severe muscle depletion, percent weight loss.  GOAL:   Patient will meet greater than or equal to 90% of their needs  MONITOR:   Labs, Weight trends, TF tolerance, I & O's  REASON FOR ASSESSMENT:   Consult Enteral/tube feeding initiation and management  ASSESSMENT:   65 y.o. 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  She is under the care of Dr. Julien Nordmann and is receiving palliative systemic chemo and is s/p 3 cycles. She was just discharged to [facility] 12/26 after recent admission (11/22 through 12/26) for A.fib RVR and large left sided pleural effusion.  She underwent thora 11/23 with 310ml fluid removed which was concerning for malignancy. Readmitted with dyspnea 2/2 large recurrent pleural effusion.  PleurX placed  12/12: G-tube placed 12/27: chest tube placed  Pt just recently discharged from [facility] on 12/26. Pt was discharged to facility on continuous feeds of Osmolite 1.5, goal rate 60 ml/hr. Per pt, they never ended up starting her tube feeds there as there was an issue with the pump they had. Pt reports not wanting to go back to that facility.  Tube feeds have been running at goal rate since yesterday.   Noted that during previous admission, an attempt was made for pt to transition to bolus feeds but she was never able to tolerate the volume so was discharged on continuous feeds.  Pt NPO, asking to have cranberry  juice.  Per weight records, pt has lost 22 lbs since 7/29 (14% wt loss x 5 months, significant for time frame).   Medications: Folic acid, Remeron  Labs reviewed:  Low Mg   NUTRITION - FOCUSED PHYSICAL EXAM:  Flowsheet Row Most Recent Value  Orbital Region Severe depletion  Upper Arm Region Severe depletion  Thoracic and Lumbar Region Moderate depletion  Buccal Region Severe depletion  Temple Region Severe depletion  Clavicle Bone Region Severe depletion  Clavicle and Acromion Bone Region Severe depletion  Scapular Bone Region Severe depletion  Dorsal Hand Moderate depletion  Patellar Region Moderate depletion  Anterior Thigh Region Moderate depletion  Posterior Calf Region Moderate depletion  Hair Reviewed  [thin]  Eyes Reviewed  Mouth Reviewed  Skin Reviewed  [dry]  Nails Reviewed  [long, thick]       Diet Order:   Diet Order             Diet NPO time specified  Diet effective now                   EDUCATION NEEDS:   Education needs have been addressed  Skin:  Skin Integrity Issues:: Stage II Stage II: coccyx  Last BM:  12/25  Height:   Ht Readings from Last 1 Encounters:  09/30/21 5\' 5"  (1.651 m)    Weight:   Wt Readings from Last 1 Encounters:  09/30/21 57 kg   BMI:  Body mass index is 20.91 kg/m.  Estimated Nutritional Needs:   Kcal:  2000-2200  Protein:  85-100g  Fluid:  2L/day   Clayton Bibles, MS, RD, LDN Inpatient Clinical Dietitian Contact information available via Amion

## 2021-09-30 NOTE — TOC Initial Note (Addendum)
Transition of Care [facility]) - Initial/Assessment Note    Patient Details  Name: Kim Ross MRN: [id number] Date of Birth: 05-Aug-1956  Transition [facility] Care [facility]) CM/SW Contact:    Kim Phi, RN Phone Number: 09/30/2021, 3:00 PM  Clinical Narrative: From [facility] recent d/c 12/26. Per [facility] family will not return back to [facility]-no bed hold per [facility] rep Kim Ross. -Spoke to dtr Kim Ross-d/c plan home w/HHC-checking with [facility]-await response.                        Patient Goals and CMS Choice        Expected Discharge Plan and Services                                                Prior Living Arrangements/Services                       Activities of Daily Living Home Assistive Devices/Equipment: Kim Ross (specify type) ADL Screening (condition at time of admission) Patient's cognitive ability adequate to safely complete daily activities?: Yes Is the patient deaf or have difficulty hearing?: No Does the patient have difficulty seeing, even when wearing glasses/contacts?: No Does the patient have difficulty concentrating, remembering, or making decisions?: Yes Patient able to express need for assistance with ADLs?: Yes Does the patient have difficulty dressing or bathing?: Yes Independently performs ADLs?: No Communication: Independent Dressing (OT): Needs assistance Is this a change from baseline?: Pre-admission baseline Grooming: Independent Feeding: Independent Bathing: Needs assistance Is this a change from baseline?: Pre-admission baseline Toileting: Needs assistance Is this a change from baseline?: Pre-admission baseline In/Out Bed: Needs assistance Is this a change from baseline?: Pre-admission baseline Walks in Home: Needs assistance Is this a change from baseline?: Pre-admission baseline Does the patient have difficulty walking or climbing stairs?: Yes Weakness of Legs: Both Weakness of Arms/Hands:  Both  Permission Sought/Granted                  Emotional Assessment              Admission diagnosis:  Pleural effusion [J90] Pleural effusion on left [J90] Chest tube in place [Z96.89] Patient Active Problem List   Diagnosis Date Noted   Pleural effusion on left 09/29/2021   Leukocytosis 09/29/2021   Anemia of chronic disease 09/29/2021   Left leg DVT ([place]) 09/29/2021   Thrombocytopenia (HCC)    Chemotherapy-induced neutropenia ([place])    A-fib ([place]) 08/26/2021   Protein-calorie malnutrition, severe 08/26/2021   Pressure injury of skin 08/26/2021   Atrial fibrillation with RVR ([place]) 08/25/2021   Pleural effusion 08/25/2021   Brain metastasis ([place]) 07/15/2021   Adenocarcinoma of left lung, stage 4 ([place]) 07/08/2021   Weight loss 07/08/2021   Lung cancer ([place]) 06/24/2021   Cancer related pain 06/24/2021   Malnutrition of moderate degree 06/16/2021   Tobacco abuse 06/15/2021   Lobar pneumonia ([place]) 06/15/2021   SIRS (systemic inflammatory response syndrome) ([place]) 06/14/2021   Type 2 diabetes mellitus ([place]) 06/14/2021   Dyspnea 05/03/2021   Lung mass 05/03/2021   Fatigue 03/26/2021   Hyperkeratosis of nail 03/11/2021   Prediabetes 02/05/2021   Stress at home 02/05/2021   Psychophysiological insomnia 02/05/2021   Dry skin 11/17/2020   Onychomycosis of left great toe 09/05/2019   Seasonal allergies 12/30/2017  Hyperlipidemia 12/30/2017   UTI (urinary tract infection) 02/09/2017   Hematuria 04/01/2015   History of recurrent UTIs 01/31/2014   Vertigo, intermittent 06/27/2013   Generalized anxiety disorder 04/10/2013   Irritable bowel syndrome 10/19/2012   Tobacco use disorder 07/20/2012   PCP:  Erskine Emery, MD Pharmacy:   [id number] - [place], [place] [address] Phone: [phone] Fax: [phone]     Social Determinants of Health (SDOH)  Interventions    Readmission Risk Interventions Readmission Risk Prevention Plan 09/18/2021  Transportation Screening Complete  Medication Review (RN Care Manager) Complete  PCP or Specialist appointment within 3-5 days of discharge Complete  HRI or Home Care Consult Complete  Palliative Care Screening Complete  Skilled Nursing Facility Complete  Some recent data might be hidden

## 2021-09-30 NOTE — Progress Notes (Signed)
NAME:  Kim Ross, MRN:  [id number], DOB:  [birth year], LOS: 0 ADMISSION DATE:  09/29/2021, CONSULTATION DATE:  09/29/21 REFERRING MD:  Regenia Skeeter CHIEF COMPLAINT:  Dyspnea   History of Present Illness:  Kim Ross is a 65 y.o. 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  She is under the care of Dr. Julien Nordmann and is receiving palliative systemic chemo and is s/p 3 cycles. She was just discharged to [facility] 12/26 after recent admission (11/22 through 12/26) for A.fib RVR and large left sided pleural effusion.  She underwent thora 11/23 with 361ml fluid removed which was concerning for malignancy. During her admission she was also found to have LLE DVT for which she was started on Lovenox, last dose 12/26.  12/27, she had dyspnea and tachycardia.  Due to no improvement after Amiodarone and Metoprolol, she was sent to the ED for further evaluation.   In ED, she was found to have complete opacification of left hemithorax 2/2 large effusion.  PCCM asked to see in consultation and for consideration pleur-x.  Pertinent  Medical History:  has Tobacco use disorder; Irritable bowel syndrome; Generalized anxiety disorder; Vertigo, intermittent; History of recurrent UTIs; Hematuria; UTI (urinary tract infection); Seasonal allergies; Hyperlipidemia; Onychomycosis of left great toe; Dry skin; Prediabetes; Stress at home; Psychophysiological insomnia; Hyperkeratosis of nail; Fatigue; Dyspnea; Lung mass; SIRS (systemic inflammatory response syndrome) ([place]); Type 2 diabetes mellitus ([place]); Tobacco abuse; Lobar pneumonia ([place]); Malnutrition of moderate degree; Lung cancer ([place]); Cancer related pain; Adenocarcinoma of left lung, stage 4 ([place]); Weight loss; Brain metastasis ([place]); Atrial fibrillation with RVR ([place]); Pleural effusion; A-fib ([place]); Protein-calorie malnutrition, severe; Pressure injury of skin; Thrombocytopenia  ([place]); Chemotherapy-induced neutropenia ([place]); Pleural effusion on left; Leukocytosis; Anemia of chronic disease; and Left leg DVT ([place]) on their problem list.  Significant Hospital Events: Including procedures, antibiotic start and stop dates in addition to other pertinent events   12/26 discharge to [facility]. 12/27 readmit with dyspnea 2/2 large recurrent pleural effusion.  PleurX placed  Interim History / Subjective:  Tolerated PleurX well.  Feels a lot better and says that's the best sleep she has had in a week. 1740 out from PleurX since placement, continues to drain this AM on 20cm suction.  Objective:  Blood pressure 92/68, pulse 84, temperature 97.8 F (36.6 C), temperature source Oral, resp. rate 20, height 5\' 5"  (1.651 m), weight 57 kg, SpO2 100 %.        Intake/Output Summary (Last 24 hours) at 09/30/2021 0917 Last data filed at 09/30/2021 0600 Gross per 24 hour  Intake 707 ml  Output 2290 ml  Net -1583 ml   Filed Weights   09/30/21 0004  Weight: 57 kg    Examination: General: Adult female, chronically ill appearing, cachectic, resting comfortably, husband at bedside. Neuro: A&O x 3, no deficits. HEENT: [place]/AT. Sclerae anicteric. EOMI. Cardiovascular: IRIR, no M/R/G.  Lungs: Left PleurX in place with [place] on 20cm suction, no leak. Respirations unlabored.  Diminished throughout left hemithorax but slightly improved from exam on admit. Abdomen: BS hypoactive, soft, NT/ND.  Musculoskeletal: No gross deformities, no edema.  Skin: Intact, warm, no rashes.  Labs/imaging personally reviewed:  CXR 12/27 > complete opacification left hemithorax. CXR 12/28 > slightly improved aeration.  Assessment & Plan:   Recurrent left sided malignant pleural effusion - last thoracentesis 11/23 with 358ml fluid removed where cytology was concerning for malignancy.  Given  her hx of stage IV NSCLC and recurrent effusion, she underwent PleurX placement 12/27 and overnight has put out  1740cc bloody output. - Continue PleurX to continuous suction for now given continued output. - Once output decreases to < 150cc then switch to drainage every other day, max 1L. - Monitor CXR. - I called [facility] to confirm that they can manage PleurX which they confirmed they can. She will need PleurX supplies, TOC to please assist and much appreciated.  Acute on chronic hypoxic respiratory failure - 2/2 above + deconditioning + atx.  Improving following PleurX. - Continue supplemental O2 as needed to maintain SpO2 > 90%. - Mobilize as able. - PleurX drainage as above.  Stage IV NSCLC on chemo. - F/u with oncology as outpatient.  LLE DVT - on Lovenox. - OK to resume home Lovenox today 12/28.  Rest per primary team. PCCM will follow for PleurX management.   Best practice (evaluated daily):  Per primary team.   Montey Hora, [place] For pager details, please see AMION or use Epic chat  After 1900, please call [facility] for cross coverage needs 09/30/2021, 9:17 AM
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
## 2021-10-01 ENCOUNTER — Inpatient Hospital Stay (HOSPITAL_COMMUNITY): Payer: 59

## 2021-10-01 DIAGNOSIS — C3492 Malignant neoplasm of unspecified part of left bronchus or lung: Secondary | ICD-10-CM | POA: Diagnosis not present

## 2021-10-01 DIAGNOSIS — I480 Paroxysmal atrial fibrillation: Secondary | ICD-10-CM | POA: Diagnosis not present

## 2021-10-01 DIAGNOSIS — J90 Pleural effusion, not elsewhere classified: Secondary | ICD-10-CM | POA: Diagnosis not present

## 2021-10-01 DIAGNOSIS — D638 Anemia in other chronic diseases classified elsewhere: Secondary | ICD-10-CM | POA: Diagnosis not present

## 2021-10-01 LAB — GLUCOSE, CAPILLARY
Glucose-Capillary: 123 mg/dL — ABNORMAL HIGH (ref 70–99)
Glucose-Capillary: 91 mg/dL (ref 70–99)
Glucose-Capillary: 122 mg/dL — ABNORMAL HIGH (ref 70–99)
Glucose-Capillary: 125 mg/dL — ABNORMAL HIGH (ref 70–99)

## 2021-10-01 LAB — CBC
HCT: 24.2 % — ABNORMAL LOW (ref 36.0–46.0)
Hemoglobin: 7.5 g/dL — ABNORMAL LOW (ref 12.0–15.0)
MCH: 29.5 pg (ref 26.0–34.0)
MCHC: 31 g/dL (ref 30.0–36.0)
MCV: 95.3 fL (ref 80.0–100.0)
Platelets: 606 10*3/uL — ABNORMAL HIGH (ref 150–400)
RBC: 2.54 MIL/uL — ABNORMAL LOW (ref 3.87–5.11)
RDW: 22.4 % — ABNORMAL HIGH (ref 11.5–15.5)
WBC: 29.1 10*3/uL — ABNORMAL HIGH (ref 4.0–10.5)
nRBC: 0.6 % — ABNORMAL HIGH (ref 0.0–0.2)

## 2021-10-01 LAB — COMPREHENSIVE METABOLIC PANEL
ALT: 100 U/L — ABNORMAL HIGH (ref 0–44)
AST: 142 U/L — ABNORMAL HIGH (ref 15–41)
Albumin: 1.5 g/dL — ABNORMAL LOW (ref 3.5–5.0)
Alkaline Phosphatase: 272 U/L — ABNORMAL HIGH (ref 38–126)
Anion gap: 5 (ref 5–15)
BUN: 15 mg/dL (ref 8–23)
CO2: 28 mmol/L (ref 22–32)
Calcium: 7.9 mg/dL — ABNORMAL LOW (ref 8.9–10.3)
Chloride: 102 mmol/L (ref 98–111)
Creatinine, Ser: 0.35 mg/dL — ABNORMAL LOW (ref 0.44–1.00)
GFR, Estimated: >60 mL/min (ref >60)
Glucose, Bld: 124 mg/dL — ABNORMAL HIGH (ref 70–99)
Potassium: 4.4 mmol/L (ref 3.5–5.1)
Sodium: 135 mmol/L (ref 135–145)
Total Bilirubin: 0.3 mg/dL (ref 0.3–1.2)
Total Protein: 5.2 g/dL — ABNORMAL LOW (ref 6.5–8.1)

## 2021-10-01 LAB — MAGNESIUM: Magnesium: 1.5 mg/dL — ABNORMAL LOW (ref 1.7–2.4)

## 2021-10-01 LAB — PHOSPHORUS: Phosphorus: 2.6 mg/dL (ref 2.5–4.6)

## 2021-10-01 MED ORDER — MAGNESIUM OXIDE -MG SUPPLEMENT 400 (240 MG) MG PO TABS
400.0000 mg | ORAL_TABLET | Freq: Two times a day (BID) | ORAL | Status: DC
  Administered 2021-10-01 – 2021-10-16 (×31): 400 mg
  Filled 2021-10-01 (×31): qty 1

## 2021-10-01 MED ORDER — POTASSIUM CHLORIDE 20 MEQ PO PACK
40.0000 meq | PACK | Freq: Once | ORAL | Status: AC
  Administered 2021-10-01: 10:00:00 40 meq
  Filled 2021-10-01: qty 2

## 2021-10-01 MED ORDER — OXYCODONE HCL 5 MG/5ML PO SOLN
5.0000 mg | ORAL | Status: DC | PRN
  Administered 2021-10-01 – 2021-10-17 (×34): 5 mg
  Filled 2021-10-01 (×34): qty 5

## 2021-10-01 MED ORDER — SERTRALINE HCL 20 MG/ML PO CONC
50.0000 mg | Freq: Every day | ORAL | Status: DC
  Administered 2021-10-02 – 2021-10-16 (×15): 50 mg
  Filled 2021-10-01 (×16): qty 2.5

## 2021-10-01 MED ORDER — ADULT MULTIVITAMIN LIQUID CH
15.0000 mL | Freq: Every day | ORAL | Status: DC
Start: 2021-10-02 — End: 2021-10-16
  Administered 2021-10-02 – 2021-10-16 (×15): 15 mL
  Filled 2021-10-01 (×15): qty 15

## 2021-10-01 MED ORDER — ACETAMINOPHEN 160 MG/5ML PO SOLN: 650.0000 mg | Freq: Four times a day (QID) | ORAL | Status: DC | PRN

## 2021-10-01 MED ORDER — BISACODYL 10 MG RE SUPP: 10.0000 mg | Freq: Every day | RECTAL | Status: DC | PRN

## 2021-10-01 MED ORDER — METOPROLOL TARTRATE 25 MG/10 ML ORAL SUSPENSION
25.0000 mg | Freq: Two times a day (BID) | ORAL | Status: DC
  Administered 2021-10-01 – 2021-10-16 (×29): 25 mg
  Filled 2021-10-01 (×31): qty 10

## 2021-10-01 NOTE — [place] FL2 (Signed)
[place] MEDICAID FL2 LEVEL OF CARE SCREENING TOOL     IDENTIFICATION  Patient Name: Kim Ross Birthdate: 15-Oct-1955 Sex: female Admission Date (Current Location): 09/29/2021  [name] and [place] Number:  Herbalist and Address:  [facility],  [place], [place]      Provider Number: [id number]  Attending Physician Name and Address:  Flora Lipps, MD  Relative Name and Phone Number:  Rocky Crafts [phone]    Current Level of Care: Hospital Recommended Level of Care: [place] Prior Approval Number:    Date Approved/Denied:   PASRR Number: [id number]  Discharge Plan: SNF    Current Diagnoses: Patient Active Problem List   Diagnosis Date Noted   Pleural effusion on left 09/29/2021   Leukocytosis 09/29/2021   Anemia of chronic disease 09/29/2021   Left leg DVT ([place]) 09/29/2021   Thrombocytopenia (HCC)    Chemotherapy-induced neutropenia (HCC)    A-fib ([place]) 08/26/2021   Protein-calorie malnutrition, severe 08/26/2021   Pressure injury of skin 08/26/2021   Atrial fibrillation with RVR ([place]) 08/25/2021   Pleural effusion 08/25/2021   Brain metastasis ([place]) 07/15/2021   Adenocarcinoma of left lung, stage 4 ([place]) 07/08/2021   Weight loss 07/08/2021   Lung cancer ([place]) 06/24/2021   Cancer related pain 06/24/2021   Malnutrition of moderate degree 06/16/2021   Tobacco abuse 06/15/2021   Lobar pneumonia ([place]) 06/15/2021   SIRS (systemic inflammatory response syndrome) ([place]) 06/14/2021   Type 2 diabetes mellitus ([place]) 06/14/2021   Dyspnea 05/03/2021   Lung mass 05/03/2021   Fatigue 03/26/2021   Hyperkeratosis of nail 03/11/2021   Prediabetes 02/05/2021   Stress at home 02/05/2021   Psychophysiological insomnia 02/05/2021   Dry skin 11/17/2020   Onychomycosis of left great toe 09/05/2019   Seasonal allergies 12/30/2017   Hyperlipidemia 12/30/2017   UTI (urinary tract infection) 02/09/2017    Hematuria 04/01/2015   History of recurrent UTIs 01/31/2014   Vertigo, intermittent 06/27/2013   Generalized anxiety disorder 04/10/2013   Irritable bowel syndrome 10/19/2012   Tobacco use disorder 07/20/2012    Orientation RESPIRATION BLADDER Height & Weight     Self, Time, Situation  O2 Indwelling catheter Weight: 57.4 kg Height:  5\' 5"  (165.1 cm)  BEHAVIORAL SYMPTOMS/MOOD NEUROLOGICAL BOWEL NUTRITION STATUS      Continent Feeding tube (see MAR)  AMBULATORY STATUS COMMUNICATION OF NEEDS Skin   Limited Assist Verbally Surgical wounds (PEG-site;Pleurx cath- site)                       Personal Care Assistance Level of Assistance  Bathing, Feeding, Dressing Bathing Assistance: Limited assistance Feeding assistance: Limited assistance Dressing Assistance: Limited assistance     Functional Limitations Info  Sight, Hearing, Speech Sight Info: Adequate Hearing Info: Adequate Speech Info: Impaired (TF-dysphagia)    SPECIAL CARE FACTORS FREQUENCY  PT (By licensed PT), OT (By licensed OT)     PT Frequency:  (5x week) OT Frequency:  (5x week)            Contractures Contractures Info: Not present    Additional Factors Info  Code Status, Allergies Code Status Info:  (DNR) Allergies Info:  (Ibuprofen;Trazadone;Nefazodone)           Current Medications (10/01/2021):  This is the current hospital active medication list Current Facility-Administered Medications  Medication Dose Route Frequency Provider Last Rate Last Admin   acetaminophen (TYLENOL) 160 MG/5ML solution 650 mg  650 mg  Per Tube Q6H PRN Pokhrel, Laxman, MD       acetaminophen (TYLENOL) suppository 650 mg  650 mg Rectal Q6H PRN Reubin Milan, MD       albuterol (PROVENTIL) (2.5 MG/3ML) 0.083% nebulizer solution 2.5 mg  2.5 mg Nebulization Q4H PRN Reubin Milan, MD       amiodarone (PACERONE) tablet 200 mg  200 mg Per Tube Daily Reubin Milan, MD   200 mg at 10/01/21 0941   bisacodyl  (DULCOLAX) suppository 10 mg  10 mg Rectal Daily PRN Pokhrel, Corrie Mckusick, MD       chlorhexidine (PERIDEX) 0.12 % solution 15 mL  15 mL Mouth Rinse BID Reubin Milan, MD   15 mL at 10/01/21 1000   Chlorhexidine Gluconate Cloth 2 % PADS 6 each  6 each Topical Daily Reubin Milan, MD   6 each at 10/01/21 0943   enoxaparin (LOVENOX) injection 60 mg  60 mg Subcutaneous Q12H Pokhrel, Laxman, MD   60 mg at 10/01/21 0944   feeding supplement (OSMOLITE 1.5 CAL) liquid 1,000 mL  1,000 mL Per Tube Continuous Reubin Milan, MD 60 mL/hr at 09/30/21 1317 1,000 mL at 09/30/21 1317   fentaNYL (DURAGESIC) 50 MCG/HR 1 patch  1 patch Transdermal Q72H Reubin Milan, MD   1 patch at 30/86/57 8469   folic acid (FOLVITE) tablet 1 mg  1 mg Per Tube Daily Reubin Milan, MD   1 mg at 10/01/21 0941   insulin aspart (novoLOG) injection 0-9 Units  0-9 Units Subcutaneous Q6H Pokhrel, Laxman, MD   1 Units at 10/01/21 1227   magnesium oxide (MAG-OX) tablet 400 mg  400 mg Per Tube BID Pokhrel, Corrie Mckusick, MD   400 mg at 10/01/21 0944   meclizine (ANTIVERT) tablet 25 mg  25 mg Per Tube BID Reubin Milan, MD   25 mg at 10/01/21 0941   MEDLINE mouth rinse  15 mL Mouth Rinse q12n4p Reubin Milan, MD   15 mL at 10/01/21 1227   metoprolol tartrate (LOPRESSOR) 25 mg/10 mL oral suspension 25 mg  25 mg Per Tube BID Pokhrel, Laxman, MD       midodrine (PROAMATINE) tablet 2.5 mg  2.5 mg Per Tube TID WC Reubin Milan, MD   2.5 mg at 10/01/21 1226   mirtazapine (REMERON SOL-TAB) disintegrating tablet 15 mg  15 mg Per Tube QHS Reubin Milan, MD   15 mg at 09/30/21 2155   [START ON 10/02/2021] multivitamin liquid 15 mL  15 mL Per Tube Daily Pokhrel, Laxman, MD       ondansetron (ZOFRAN) tablet 4 mg  4 mg Per Tube Q6H PRN Reubin Milan, MD       Or   ondansetron [facility]) injection 4 mg  4 mg Intravenous Q6H PRN Reubin Milan, MD       oxyCODONE (ROXICODONE) 5 MG/5ML solution 5 mg  5 mg  Per Tube Q4H PRN Pokhrel, Laxman, MD       prochlorperazine (COMPAZINE) tablet 10 mg  10 mg Per Tube Q6H PRN Reubin Milan, MD       [START ON 10/02/2021] sertraline (ZOLOFT) 20 MG/ML concentrated solution 50 mg  50 mg Per Tube Daily Pokhrel, Laxman, MD       sodium chloride flush (NS) 0.9 % injection 10 mL  10 mL Other Q8H Candee Furbish, MD   10 mL at 10/01/21 0800     Discharge Medications: Please see discharge  summary for a list of discharge medications.  Relevant Imaging Results:  Relevant Lab Results:   Additional Information SS#[id number];[employer] x2;Booster x1  Alley Neils, Juliann Pulse, RN

## 2021-10-01 NOTE — TOC Progression Note (Addendum)
Transition of Care [facility]) - Progression Note    Patient Details  Name: ANNALYSA MOHAMMAD MRN: [id number] Date of Birth: Oct 30, 1955  [facility] CM/SW Contact  Dakoda Bassette, Juliann Pulse, RN Phone Number: 10/01/2021, 2:14 PM  Clinical Narrative: Damaris Schooner to dtr [name]-agree to fax out for SNF-decline returning back to [facility]-await bed offers.   -3p-[facility] rep Tommi Rumps following-checking if can accept for HHRN/PT-teaching on TF-Pam [place]-rep for [facility]-home infusion-will check if can start initial teaching on TF, & management-await response from both. Currently no bed offers. [name] in agreement prefers home w/HHC vs SNF.   Expected Discharge Plan: Skilled Nursing Facility Barriers to Discharge: Continued Medical Work up  Expected Discharge Plan and Services Expected Discharge Plan: [place]                                               Social Determinants of Health (SDOH) Interventions    Readmission Risk Interventions Readmission Risk Prevention Plan 09/30/2021 09/18/2021  Transportation Screening Complete Complete  Medication Review [employer]) - Complete  PCP or Specialist appointment within 3-5 days of discharge Complete Complete  HRI or Home Care Consult - Complete  SW Recovery Care/Counseling Consult Complete -  Palliative Care Screening Complete Complete  Skilled Nursing Facility Complete Complete  Some recent data might be hidden
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
## 2021-10-02 DIAGNOSIS — C3492 Malignant neoplasm of unspecified part of left bronchus or lung: Secondary | ICD-10-CM | POA: Diagnosis not present

## 2021-10-02 DIAGNOSIS — J90 Pleural effusion, not elsewhere classified: Secondary | ICD-10-CM | POA: Diagnosis not present

## 2021-10-02 DIAGNOSIS — D638 Anemia in other chronic diseases classified elsewhere: Secondary | ICD-10-CM | POA: Diagnosis not present

## 2021-10-02 DIAGNOSIS — I480 Paroxysmal atrial fibrillation: Secondary | ICD-10-CM | POA: Diagnosis not present

## 2021-10-02 LAB — CBC
HCT: 23.5 % — ABNORMAL LOW (ref 36.0–46.0)
Hemoglobin: 7.4 g/dL — ABNORMAL LOW (ref 12.0–15.0)
MCH: 29.7 pg (ref 26.0–34.0)
MCHC: 31.5 g/dL (ref 30.0–36.0)
MCV: 94.4 fL (ref 80.0–100.0)
Platelets: 564 10*3/uL — ABNORMAL HIGH (ref 150–400)
RBC: 2.49 MIL/uL — ABNORMAL LOW (ref 3.87–5.11)
RDW: 22.6 % — ABNORMAL HIGH (ref 11.5–15.5)
WBC: 30.6 10*3/uL — ABNORMAL HIGH (ref 4.0–10.5)
nRBC: 0.8 % — ABNORMAL HIGH (ref 0.0–0.2)

## 2021-10-02 LAB — BASIC METABOLIC PANEL
Anion gap: 7 (ref 5–15)
BUN: 13 mg/dL (ref 8–23)
CO2: 25 mmol/L (ref 22–32)
Calcium: 8 mg/dL — ABNORMAL LOW (ref 8.9–10.3)
Chloride: 101 mmol/L (ref 98–111)
Creatinine, Ser: 0.37 mg/dL — ABNORMAL LOW (ref 0.44–1.00)
GFR, Estimated: >60 mL/min (ref >60)
Glucose, Bld: 132 mg/dL — ABNORMAL HIGH (ref 70–99)
Potassium: 4.6 mmol/L (ref 3.5–5.1)
Sodium: 133 mmol/L — ABNORMAL LOW (ref 135–145)

## 2021-10-02 LAB — GLUCOSE, CAPILLARY
Glucose-Capillary: 106 mg/dL — ABNORMAL HIGH (ref 70–99)
Glucose-Capillary: 117 mg/dL — ABNORMAL HIGH (ref 70–99)
Glucose-Capillary: 131 mg/dL — ABNORMAL HIGH (ref 70–99)
Glucose-Capillary: 136 mg/dL — ABNORMAL HIGH (ref 70–99)
Glucose-Capillary: 142 mg/dL — ABNORMAL HIGH (ref 70–99)

## 2021-10-02 LAB — MAGNESIUM: Magnesium: 1.3 mg/dL — ABNORMAL LOW (ref 1.7–2.4)

## 2021-10-02 MED ORDER — JEVITY 1.5 CAL/FIBER PO LIQD
1000.0000 mL | ORAL | Status: DC
  Administered 2021-10-02 – 2021-10-14 (×9): 1000 mL
  Filled 2021-10-02 (×22): qty 1000

## 2021-10-02 MED ORDER — FREE WATER
150.0000 mL | Freq: Every day | Status: DC
  Administered 2021-10-02 – 2021-10-17 (×75): 150 mL

## 2021-10-02 NOTE — Plan of Care (Signed)
  Problem: Clinical Measurements: Goal: Ability to maintain clinical measurements within normal limits will improve Outcome: Progressing   Problem: Clinical Measurements: Goal: Diagnostic test results will improve Outcome: Progressing   Problem: Clinical Measurements: Goal: Respiratory complications will improve Outcome: Progressing   

## 2021-10-02 NOTE — Progress Notes (Signed)
NAME:  Kim Ross, MRN:  [id number], DOB:  July 17, 1956, LOS: 2 ADMISSION DATE:  09/29/2021, CONSULTATION DATE:  09/29/21 REFERRING MD:  Criss Alvine CHIEF COMPLAINT:  Dyspnea   History of Present Illness:  Kim Ross is a 65 y.o. 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  She is under the care of Dr. Arbutus Ped and is receiving palliative systemic chemo and is s/p 3 cycles. She was just discharged to [facility] 12/26 after recent admission (11/22 through 12/26) for A.fib RVR and large left sided pleural effusion.  She underwent thora 11/23 with fluid removed which was concerning for malignancy. During her admission she was also found to have LLE DVT for which she was started on Lovenox, last dose 12/26.  12/27, she had dyspnea and tachycardia.  Due to no improvement after Amiodarone and Metoprolol, she was sent to the ED for further evaluation.   In ED, she was found to have complete opacification of left hemithorax 2/2 large effusion.  PCCM asked to see in consultation and for consideration pleur-x.  Pertinent  Medical History:  Stage IV non-small cell lung cancer with mets to the brain History of DVT Malignant pleural effusion Paroxysmal atrial fibrillation Type 2 diabetes Protein calorie malnutrition Cocaine abuse Anxiety and depression  Significant Hospital Events: Including procedures, antibiotic start and stop dates in addition to other pertinent events   12/26 discharge to [facility]. 12/27 readmit with dyspnea 2/2 large recurrent pleural effusion.  PleurX placed 12/30 reevaluated with no acute issues over the last few days, Pleurx output decreased  Interim History / Subjective:  Seen lying in bed in NAD  Objective:  Blood pressure 100/66, pulse 92, temperature 98.4 F (36.9 C), temperature source Oral, resp. rate 16, height 5\' 5"  (1.651 m), weight 56.4 kg, SpO2 100 %.         Intake/Output Summary (Last 24 hours) at 10/02/2021 1310 Last data filed at 10/02/2021 1200 Gross per 24 hour  Intake 1380 ml  Output 2000 ml  Net -620 ml    Filed Weights   09/30/21 0004 10/01/21 0500 10/02/21 0500  Weight: 57 kg 57.4 kg 56.4 kg    Examination: General: Acute on chronically ill appearing deconditioned elderly  female lying in bed, in NAD HEENT: [place]/AT, MM pink/moist, PERRL,  Neuro: Alert and oriented x3 CV: s1s2 regular rate and rhythm, no murmur, rubs, or gallops,  PULM:  Diminished left side, chest tube in place, no increased work of breathing  GI: soft, bowel sounds active in all 4 quadrants, non-tender, non-distended, tolerating oral diet Extremities: warm/dry, no edema  Skin: no rashes or lesions  Labs/imaging personally reviewed:  CXR 12/27 > complete opacification left hemithorax. CXR 12/28 > slightly improved aeration.  Assessment & Plan:   Recurrent left sided malignant pleural effusion  - last thoracentesis 11/23 with 12/23 fluid removed where cytology was concerning for malignancy.  Given her hx of stage IV NSCLC and recurrent effusion, she underwent PleurX placement 12/27 and overnight has put out 1740cc bloody output. Acute on chronic hypoxic respiratory failure  - 2/2 above + deconditioning + atx.  Improving following PleurX. Stage IV NSCLC on chemo. P: Pleurx output significantly decreased can likely transition to every other day Pleurx bottle drainage Please only drain 1 L max per Pleurx As needed repeat chest x-rays If dyspnea observed patient drain pleurx  Obtain Pleurx drainage kit SNF confirmed able to provide  Pleurx care upon discharge Follow-up with outpatient oncology  LLE DVT - on Lovenox. P: Continue Lovenox.  PCCM will sign off. Thank you for the opportunity to participate in this patient's care. Please contact if we can be of further assistance.  Best practice (evaluated daily):  Per primary team.   Johnathan Heskett D.  Kenton Kingfisher, NP-C [facility] Personal contact information can be found on Amion  10/02/2021, 1:47 PM

## 2021-10-02 NOTE — TOC Progression Note (Signed)
Transition of Care [facility]) - Progression Note    Patient Details  Name: LOREE SHEHATA MRN: [id number] Date of Birth: Nov 04, 1955  Transition of Care [facility] CM/SW Contact  Amarii Bordas, Juliann Pulse, RN Phone Number: 10/02/2021, 10:42 AM  Clinical Narrative:  Dtr agree to home w/HHC-[facility] rep cory-HHRN/PT-start of care next Mon/Tues if stable;Pam Chandler-home infusion-TF will provide intial teaching w/dtr brianna in hospital. PTAR needed for transport @ d/c.     Expected Discharge Plan: [place] Barriers to Discharge: Continued Medical Work up  Expected Discharge Plan and Services Expected Discharge Plan: [place]   Discharge Planning Services: CM Consult                               HH Arranged: RN, PT (PEG-TF) HH Agency: [facility], [place] Date [facility] Agency Contacted: 10/02/21 Time HH Agency Contacted: 15 Representative spoke with at [place]: Cory/Pam chandler   Social Determinants of Health ([place]) Interventions    Readmission Risk Interventions Readmission Risk Prevention Plan 09/30/2021 09/18/2021  Transportation Screening Complete Complete  Medication Review [employer]) - Complete  PCP or Specialist appointment within 3-5 days of discharge Complete Complete  HRI or [place] - Complete  SW Recovery Care/Counseling Consult Complete -  Palliative Care Screening Complete Complete  Skilled Nursing Facility Complete Complete  Some recent data might be hidden

## 2021-10-02 NOTE — Progress Notes (Signed)
PCCM Progress Note  Patient's Pleurx catheter disconnected from continuous Sarah drainage system.  Pleurx dressed sterilely per policy.  We will proceed with every other day drainage starting tomorrow 12/31.   PCCM will be available as needed  Corian Handley D. Kenton Kingfisher, NP-C [facility] Personal contact information can be found on Amion  10/02/2021, 5:15 PM

## 2021-10-02 NOTE — Progress Notes (Signed)
Nutrition Follow-up  DOCUMENTATION CODES:   Severe malnutrition in context of chronic illness  INTERVENTION:   -Transition to Jevity 1.5 at 30 ml/hr via G-tube, advance by 10 ml every 12 hours to goal rate of 60 ml/hr -this is to ensure pt tolerates increases in fiber.  -Provides 2160 kcals, 91g protein and 1094 ml H2O  -Increase free water recommendation to 150 ml 5x daily (750 ml)  -Liquid MVI daily via tube  NUTRITION DIAGNOSIS:   Severe Malnutrition related to chronic illness, cancer and cancer related treatments as evidenced by severe fat depletion, severe muscle depletion, percent weight loss.  Ongoing.  GOAL:   Patient will meet greater than or equal to 90% of their needs  Meeting with TF  MONITOR:   Labs, Weight trends, TF tolerance, I & O's  ASSESSMENT:   65 y.o. 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  She is under the care of Dr. Julien Nordmann and is receiving palliative systemic chemo and is s/p 3 cycles. She was just discharged to [facility] SNF 12/26 after recent admission (11/22 through 12/26) for A.fib RVR and large left sided pleural effusion.  She underwent thora 11/23 with 343ml fluid removed which was concerning for malignancy. Readmitted with dyspnea 2/2 large recurrent pleural effusion.  PleurX placed  12/12: G-tube placed 12/27: chest tube placed  Pam from [facility] reached out to RD regarding discharge plans for TF. Currently Osmolite 1.5 not available from [facility] so pt will need a different formula for discharge. Currently tolerating Osmolite 1.5 @ 60 ml/hr. Will transition to Jevity 1.5 and slowly advance to allow pt to adjust to fiber content. Will increase free water given increases in fiber.  RN had reported yesterday that pt's tube was clogged but was successfully unclogged so some medications were switched to liquid forms.  Admission weight: 125 lbs. Current  weight: 124 lbs.  Medications: Remeron, MAG-OX, Folic acid, liquid MVI  Labs reviewed: CBGs: 91-131 Low Na, Mg  Diet Order:   Diet Order             Diet NPO time specified  Diet effective now                   EDUCATION NEEDS:   Education needs have been addressed  Skin:  Skin Integrity Issues:: Stage II Stage II: coccyx  Last BM:  12/30 -type 6  Height:   Ht Readings from Last 1 Encounters:  09/30/21 5\' 5"  (1.651 m)    Weight:   Wt Readings from Last 1 Encounters:  10/02/21 56.4 kg    BMI:  Body mass index is 20.68 kg/m.  Estimated Nutritional Needs:   Kcal:  2000-2200  Protein:  85-100g  Fluid:  2L/day  Clayton Bibles, MS, RD, LDN Inpatient Clinical Dietitian Contact information available via Amion

## 2021-10-02 NOTE — Plan of Care (Signed)
Discussed with patient in front of significant other plan of care for the evening, pain management and removal of chest tube with some teach back displayed.  Problem: Education: Goal: Knowledge of General Education information will improve Description: Including pain rating scale, medication(s)/side effects and non-pharmacologic comfort measures Outcome: Progressing   Problem: Health Behavior/Discharge Planning: Goal: Ability to manage health-related needs will improve Outcome: Progressing

## 2021-10-02 NOTE — Progress Notes (Signed)
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  Of note, patient was recently admitted hospital for atrial fibrillation with RVR and a loculated pleural effusion with left lower extremity DVT.  On this admission, patient was on nonrebreather mask on arrival.  Patient was mildly tachycardic and was saturating 100% on nonrebreather mask at 10 L/min.  Patient was given duo nebs and was admitted hospital.  COVID and influenza was negative.  WBC showed a white count of 33.9.  Albumin was 1.7.  Portable chest x-ray showed complete hyperexpansion of the left hemithorax with rightward shift of the metastasis structures due to large pleural effusion.  Patient was then admitted hospital for further evaluation and treatment.  Pulmonary was urgently consulted.  Patient underwent tunneled Pleurx cath placement on 09/29/2021.  Assessment/Plan:  Principal Problem:   Pleural effusion on left Active Problems:   Generalized anxiety disorder   Hyperlipidemia   Type 2 diabetes mellitus (HCC)   Adenocarcinoma of left lung, stage 4 (HCC)   Pleural effusion   A-fib (HCC)   Protein-calorie malnutrition, severe   Pressure injury of skin   Leukocytosis   Anemia of chronic disease   Left leg DVT (HCC)   Lateral left-sided malignant pleural effusion.  History of stage IV lung cancer.  Seen by pulmonary and status post Pleurx  catheter placement.  Further management as per per PCCM.    Cancer related pain. Received Morphine IV, on fentanyl patch and oxycodone. Will continue oxycodone and fentanyl patch for now.  Hypomagnesemia.  Mild.  We will replenish.    Leukocytosis Likely secondary to recent Granix administration.  No obvious fever or chills.  WBC at 30.6 today.     Anemia of chronic disease   Secondary to malignancy and chemotherapy, we will continue to monitor.  Latest hemoglobin of 7.54    Left leg DVT  Continue Lovenox therapeutic dose.     Generalized anxiety disorder Continue mirtazapine and sertraline     Hyperlipidemia On Lipitor at home.  Diabetes mellitus type 2.   On metformin at home.  We will continue with sliding scale insulin. Diabetic diet.    Paroxysmal A-fib CHA2DS2-VASc score of 2.  Continue amiodarone, enoxaparin and metoprolol.     Protein-calorie malnutrition, severe Continue tube feeding.     Pressure injury of skin Present on admission.  Continue local wound care.  Disposition: patient was at [facility] skilled nursing facility.  At this time patient is considering home with home health.  Still has a chest tube with waterseal.  DVT prophylaxis:   Lovenox subcu therapeutic dose  Code Status: DNR  Family Communication:  I again spoke with the patient's husband at bedside on 10/01/2021.  Status is: inpatient  The patient is inpatient because: Chest tube placement, metastatic lung cancer,  Consultants: PCCM  Procedures: Left chest wall tunneled Pleurx catheter placement on 09/29/2021  Anti-infectives:  none  Anti-infectives (From admission, onward)    None      Subjective: Today, patient was seen and examined at  bedside with feels that her breathing has improved.  Denies overt pain.  Denies any nausea or vomiting.   Objective: Vitals:   10/02/21 0449 10/02/21 0800  BP: (!) 102/59   Pulse: 87   Resp: 19 16  Temp: 97.6 F (36.4 C)   SpO2: 100%      Intake/Output Summary (Last 24 hours) at 10/02/2021 1133 Last data filed at 10/02/2021 0600 Gross per 24 hour  Intake 1380 ml  Output 1600 ml  Net -220 ml    Filed Weights   09/30/21 0004 10/01/21 0500 10/02/21 0500  Weight: 57 kg 57.4 kg 56.4 kg   Body mass index is 20.68 kg/m.   Physical Exam:  General: Thinly built, not in obvious distress, on nasal cannula oxygen HENT: Mild icterus and pallor noted.  Oral mucosa is moist.  Chest:   Diminished breath sounds bilaterally. No crackles or wheezes.  Left sided tunneled catheter in place with CVS: S1 &S2 heard. No murmur.  Regular rate and rhythm. Abdomen: Soft, nontender, nondistended.  Bowel sounds are heard.   Extremities: No cyanosis, clubbing or edema.  Peripheral pulses are palpable. Psych: Alert, awake and oriented, normal mood CNS:  No cranial nerve deficits.  Generalized weakness noted. Skin: Warm and dry.  No rashes noted.  Data Review: I have personally reviewed the following laboratory data and studies,  CBC: Recent Labs  Lab 09/28/21 0434 09/29/21 1247 09/30/21 0856 10/01/21 0448 10/02/21 0525  WBC 35.0* 33.9* 35.8* 29.1* 30.6*  NEUTROABS  --  30.6*  --   --   --   HGB 7.9* 8.2* 7.6* 7.5* 7.4*  HCT 25.0* 27.0* 24.4* 24.2* 23.5*  MCV 94.0 97.1 95.7 95.3 94.4  PLT 550* 679* 662* 606* 564*    Basic Metabolic Panel: Recent Labs  Lab 09/27/21 0546 09/28/21 0434 09/29/21 1247 09/29/21 1648 09/30/21 0856 10/01/21 0448 10/02/21 0525  NA 134*  --  136  --  139 135 133*  K 4.9  --  4.7  --  4.4 4.4 4.6  CL 98  --  99  --  103 102 101  CO2 30  --  29  --  29 28 25   GLUCOSE 125*  --  182*  --  136* 124* 132*  BUN 12  --  17  --  17 15 13   CREATININE 0.31*  --  0.42*  --  <0.30* 0.35* 0.37*  CALCIUM 7.7*  --  8.1*  --  8.2* 7.9* 8.0*  MG 1.6* 1.7  --  1.5*  --  1.5* 1.3*  PHOS 3.6  --   --  4.0  --  2.6  --     Liver Function Tests: Recent Labs  Lab 09/29/21 1247 09/30/21 0856 10/01/21 0448   AST 31 50* 142*  ALT 27 34 100*  ALKPHOS 311* 258* 272*  BILITOT 0.5 0.2* 0.3  PROT 6.4* 5.4* 5.2*  ALBUMIN 1.7* <1.5* 1.5*    No results for input(s): LIPASE, AMYLASE in the last 168 hours. No results for input(s): AMMONIA in the last 168 hours. Cardiac Enzymes: No results for input(s): CKTOTAL, CKMB, CKMBINDEX, TROPONINI in the last 168 hours. BNP (last 3 results) Recent Labs    09/29/21 1247  BNP 255.9*     ProBNP (last 3 results) No results for input(s): PROBNP in the last 8760 hours.  CBG: Recent Labs  Lab 10/01/21 0816 10/01/21 1131 10/01/21 1638 10/02/21 0014 10/02/21 0528  GLUCAP 91 122* 125* 117* 131*  Recent Results (from the past 240 hour(s))  Resp Panel by RT-PCR (Flu A&B, Covid) Nasopharyngeal Swab     Status: None   Collection Time: 09/28/21  3:26 PM   Specimen: Nasopharyngeal Swab; Nasopharyngeal(NP) swabs in vial transport medium  Result Value Ref Range Status   SARS Coronavirus 2 by RT PCR NEGATIVE NEGATIVE Final    Comment: (NOTE) SARS-CoV-2 target nucleic acids are NOT DETECTED.  The SARS-CoV-2 RNA is generally detectable in upper respiratory specimens during the acute phase of infection. The lowest concentration of SARS-CoV-2 viral copies this assay can detect is 138 copies/mL. A negative result does not preclude SARS-Cov-2 infection and should not be used as the sole basis for treatment or other patient management decisions. A negative result may occur with  improper specimen collection/handling, submission of specimen other than nasopharyngeal swab, presence of viral mutation(s) within the areas targeted by this assay, and inadequate number of viral copies(<138 copies/mL). A negative result must be combined with clinical observations, patient history, and epidemiological information. The expected result is Negative.  Fact Sheet for Patients:  EntrepreneurPulse.com.au  Fact Sheet for Healthcare Providers:   IncredibleEmployment.be  This test is no t yet approved or cleared by the [place] FDA and  has been authorized for detection and/or diagnosis of SARS-CoV-2 by FDA under an Emergency Use Authorization (EUA). This EUA will remain  in effect (meaning this test can be used) for the duration of the COVID-19 declaration under Section 564(b)(1) of the Act, 21 U.S.C.section 360bbb-3(b)(1), unless the authorization is terminated  or revoked sooner.       Influenza A by PCR NEGATIVE NEGATIVE Final   Influenza B by PCR NEGATIVE NEGATIVE Final    Comment: (NOTE) The Xpert Xpress SARS-CoV-2/FLU/RSV plus assay is intended as an aid in the diagnosis of influenza from Nasopharyngeal swab specimens and should not be used as a sole basis for treatment. Nasal washings and aspirates are unacceptable for Xpert Xpress SARS-CoV-2/FLU/RSV testing.  Fact Sheet for Patients: EntrepreneurPulse.com.au  Fact Sheet for Healthcare Providers: IncredibleEmployment.be  This test is not yet approved or cleared by the [place] FDA and has been authorized for detection and/or diagnosis of SARS-CoV-2 by FDA under an Emergency Use Authorization (EUA). This EUA will remain in effect (meaning this test can be used) for the duration of the COVID-19 declaration under Section 564(b)(1) of the Act, 21 U.S.C. section 360bbb-3(b)(1), unless the authorization is terminated or revoked.  Performed at [facility], [place] [address], [place], [place] [address]   Resp Panel by RT-PCR (Flu A&B, Covid) Nasopharyngeal Swab     Status: None   Collection Time: 09/29/21 12:01 PM   Specimen: Nasopharyngeal Swab; Nasopharyngeal(NP) swabs in vial transport medium  Result Value Ref Range Status   SARS Coronavirus 2 by RT PCR NEGATIVE NEGATIVE Final    Comment: (NOTE) SARS-CoV-2 target nucleic acids are NOT DETECTED.  The SARS-CoV-2 RNA is generally  detectable in upper respiratory specimens during the acute phase of infection. The lowest concentration of SARS-CoV-2 viral copies this assay can detect is 138 copies/mL. A negative result does not preclude SARS-Cov-2 infection and should not be used as the sole basis for treatment or other patient management decisions. A negative result may occur with  improper specimen collection/handling, submission of specimen other than nasopharyngeal swab, presence of viral mutation(s) within the areas targeted by this assay, and inadequate number of viral copies(<138 copies/mL). A negative result must be combined with clinical observations, patient history, and epidemiological  information. The expected result is Negative.  Fact Sheet for Patients:  EntrepreneurPulse.com.au  Fact Sheet for Healthcare Providers:  IncredibleEmployment.be  This test is no t yet approved or cleared by the [place] FDA and  has been authorized for detection and/or diagnosis of SARS-CoV-2 by FDA under an Emergency Use Authorization (EUA). This EUA will remain  in effect (meaning this test can be used) for the duration of the COVID-19 declaration under Section 564(b)(1) of the Act, 21 U.S.C.section 360bbb-3(b)(1), unless the authorization is terminated  or revoked sooner.       Influenza A by PCR NEGATIVE NEGATIVE Final   Influenza B by PCR NEGATIVE NEGATIVE Final    Comment: (NOTE) The Xpert Xpress SARS-CoV-2/FLU/RSV plus assay is intended as an aid in the diagnosis of influenza from Nasopharyngeal swab specimens and should not be used as a sole basis for treatment. Nasal washings and aspirates are unacceptable for Xpert Xpress SARS-CoV-2/FLU/RSV testing.  Fact Sheet for Patients: EntrepreneurPulse.com.au  Fact Sheet for Healthcare Providers: IncredibleEmployment.be  This test is not yet approved or cleared by the [place] FDA  and has been authorized for detection and/or diagnosis of SARS-CoV-2 by FDA under an Emergency Use Authorization (EUA). This EUA will remain in effect (meaning this test can be used) for the duration of the COVID-19 declaration under Section 564(b)(1) of the Act, 21 U.S.C. section 360bbb-3(b)(1), unless the authorization is terminated or revoked.  Performed at [facility], [place] [address] [place], [place] [address]       Studies: DG Chest Port 1 View  Result Date: 10/01/2021 CLINICAL DATA:  A 65 year old female presents for evaluation of LEFT-sided pleural effusion with chest tube in place. EXAM: PORTABLE CHEST 1 VIEW COMPARISON:  Comparison with exam from September 30, 2021. FINDINGS: LEFT-sided chest tube remains in place in the LEFT upper chest. Cap of pleural fluid similar to previous imaging accounting for slight differences in projection. Persistent opacification of the LEFT hemithorax. Airspace disease and pleural fluid in the LEFT chest with similar appearance to the radiograph from December 28th. No visible pneumothorax. RIGHT chest with minimal interstitial prominence. Stable appearance of mediastinal contours. EKG leads project over the chest. On limited assessment there is no acute skeletal process. IMPRESSION: Stable appearance of the chest since prior imaging with left-sided chest tube in place. Signs of pleural fluid and volume loss in the LEFT chest, improved since September 29, 2021 but not substantially changed when compared to September 30, 2021. Known mass lesions not well evaluated. Electronically Signed   By: Zetta Bills M.D.   On: 10/01/2021 08:14      Flora Lipps, MD  [facility] 10/02/2021  If 7PM-7AM, please contact night-coverage

## 2021-10-03 ENCOUNTER — Inpatient Hospital Stay (HOSPITAL_COMMUNITY): Payer: 59

## 2021-10-03 DIAGNOSIS — D638 Anemia in other chronic diseases classified elsewhere: Secondary | ICD-10-CM | POA: Diagnosis not present

## 2021-10-03 DIAGNOSIS — I480 Paroxysmal atrial fibrillation: Secondary | ICD-10-CM | POA: Diagnosis not present

## 2021-10-03 DIAGNOSIS — J90 Pleural effusion, not elsewhere classified: Secondary | ICD-10-CM | POA: Diagnosis not present

## 2021-10-03 DIAGNOSIS — C3492 Malignant neoplasm of unspecified part of left bronchus or lung: Secondary | ICD-10-CM | POA: Diagnosis not present

## 2021-10-03 LAB — GLUCOSE, CAPILLARY
Glucose-Capillary: 113 mg/dL — ABNORMAL HIGH (ref 70–99)
Glucose-Capillary: 134 mg/dL — ABNORMAL HIGH (ref 70–99)
Glucose-Capillary: 147 mg/dL — ABNORMAL HIGH (ref 70–99)

## 2021-10-03 MED ORDER — POLYETHYLENE GLYCOL 3350 17 G PO PACK
17.0000 g | PACK | Freq: Every day | ORAL | Status: DC
  Administered 2021-10-03 – 2021-10-18 (×12): 17 g
  Filled 2021-10-03 (×13): qty 1

## 2021-10-03 NOTE — Progress Notes (Addendum)
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  Of note, patient was recently admitted hospital for atrial fibrillation with RVR and a loculated pleural effusion with left lower extremity DVT.  On this admission, patient was on nonrebreather mask on arrival.  Patient was mildly tachycardic and was saturating 100% on nonrebreather mask at 10 L/min.  Patient was given duo nebs and was admitted hospital.  COVID and influenza was negative.  WBC showed a white count of 33.9.  Albumin was 1.7.  Portable chest x-ray showed complete hyperexpansion of the left hemithorax with rightward shift of the metastasis structures due to large pleural effusion.  Patient was then admitted hospital for further evaluation and treatment.  Pulmonary was urgently consulted.  Patient underwent tunneled Pleurx cath placement on 09/29/2021.  Assessment/Plan:  Principal Problem:   Pleural effusion on left Active Problems:   Generalized anxiety disorder   Hyperlipidemia   Type 2 diabetes mellitus (HCC)   Adenocarcinoma of left lung, stage 4 (HCC)   Pleural effusion   A-fib (HCC)   Protein-calorie malnutrition, severe   Pressure injury of skin   Leukocytosis   Anemia of chronic disease   Left leg DVT (HCC)   Lateral left-sided malignant pleural effusion.  History of stage IV lung cancer.  Seen by pulmonary and status post Pleurx  catheter placement.  Patient will be seen by home health on discharge for Pleurx catheter management.  Plan is to drain Pleurx catheter every other day with maximum of 1 L at 1 time.  Cancer related pain.  on fentanyl patch and oxycodone.   Constipation.  We will add MiraLAX through the PEG tube.  Continue Dulcolax suppository  Hypomagnesemia.  Mild.  We will continue to replenish orally.    Leukocytosis Likely secondary to recent Granix administration.  No obvious fever or chills.  Latest WBC at 30.6      Anemia of chronic disease   Secondary to malignancy and chemotherapy, we will continue to monitor.  Latest hemoglobin of 7.4    Left leg DVT  Continue Lovenox therapeutic dose.     Generalized anxiety disorder Continue mirtazapine and sertraline     Hyperlipidemia On Lipitor at home.  Diabetes mellitus type 2.   On metformin at home.  We will continue with sliding scale insulin. Diabetic diet.    Paroxysmal A-fib CHA2DS2-VASc score of 2.  Continue amiodarone, enoxaparin and metoprolol.  Heart rate controlled     Protein-calorie malnutrition, severe Continue tube feeding.     Pressure injury of skin Present on admission.  Continue local wound care.  Disposition: patient was at [facility] skilled nursing facility.  At this time patient is considering home with home health.  Likely home after the weekend when home health is arranged.  DVT prophylaxis:   Lovenox subcu therapeutic dose  Code Status: DNR  Family Communication:  I again spoke with the patient's husband at bedside on 10/01/2021.  Status is: inpatient  The patient is inpatient because: Chest tube placement, metastatic lung cancer,  Consultants: PCCM  Procedures: Left chest wall tunneled Pleurx catheter placement on 09/29/2021  Anti-infectives:  none  Anti-infectives (From admission, onward)    None      Subjective: Today, patient was seen and examined at bedside.  Denies interval complaints.   Has mild cough.  Denies any chest pain or shortness of breath.   Objective: Vitals:   10/03/21 0503 10/03/21 0544  BP: 107/71   Pulse: 98   Resp: 18 17  Temp: 98 F (36.7 C)   SpO2: 100%     Intake/Output Summary (Last 24 hours) at 10/03/2021 1040 Last data filed at 10/03/2021 0958 Gross per 24 hour  Intake 1685 ml  Output 2400 ml  Net -715 ml    Filed Weights   10/01/21 0500 10/02/21 0500 10/03/21 0500  Weight: 57.4 kg 56.4 kg 57 kg   Body mass index is 20.91 kg/m.   Physical Exam: General: Thinly built, frail female, deconditioned, not in obvious distress, on nasal cannula oxygen.   HENT: Mild icterus and pallor noted.  Oral mucosa is moist.  Chest:   Breath sounds bilaterally.  Left-sided tunneled catheter  CVS: S1 &S2 heard. No murmur.  Regular rate and rhythm. Abdomen: Soft, nontender, nondistended.  Bowel sounds are heard.   Extremities: No cyanosis, clubbing or edema.  Peripheral pulses are palpable. Psych: Alert, awake and communicative,  CNS:  No cranial nerve deficits.  Generalized weakness noted. Skin: Warm and dry.  No rashes noted.  Data Review: I have personally reviewed the following laboratory data and studies,  CBC: Recent Labs  Lab 09/28/21 0434 09/29/21 1247 09/30/21 0856 10/01/21 0448 10/02/21 0525  WBC 35.0* 33.9* 35.8* 29.1* 30.6*  NEUTROABS  --  30.6*  --   --   --   HGB 7.9* 8.2* 7.6* 7.5* 7.4*  HCT 25.0* 27.0* 24.4* 24.2* 23.5*  MCV 94.0 97.1 95.7 95.3 94.4  PLT 550* 679* 662* 606* 564*    Basic Metabolic Panel: Recent Labs  Lab 09/27/21 0546 09/28/21 0434 09/29/21 1247 09/29/21 1648 09/30/21 0856 10/01/21 0448 10/02/21 0525  NA 134*  --  136  --  139 135 133*  K 4.9  --  4.7  --  4.4 4.4 4.6  CL 98  --  99  --  103 102 101  CO2 30  --  29  --  29 28 25   GLUCOSE 125*  --  182*  --  136* 124* 132*  BUN 12  --  17  --  17 15 13   CREATININE 0.31*  --  0.42*  --  <0.30* 0.35* 0.37*  CALCIUM 7.7*  --  8.1*  --  8.2* 7.9*  8.0*  MG 1.6* 1.7  --  1.5*  --  1.5* 1.3*  PHOS 3.6  --   --  4.0  --  2.6  --     Liver Function Tests: Recent Labs  Lab 09/29/21 1247 09/30/21 0856 10/01/21 0448  AST 31 50* 142*  ALT 27 34 100*  ALKPHOS 311* 258* 272*  BILITOT 0.5 0.2* 0.3  PROT 6.4* 5.4* 5.2*  ALBUMIN 1.7* <1.5* 1.5*    No results for input(s): LIPASE, AMYLASE in the last 168 hours. No results for input(s): AMMONIA in the last 168 hours. Cardiac Enzymes: No results for input(s): CKTOTAL, CKMB, CKMBINDEX, TROPONINI in the last 168 hours. BNP (last 3 results) Recent Labs    09/29/21 1247  BNP 255.9*     ProBNP (last 3 results) No results  for input(s): PROBNP in the last 8760 hours.  CBG: Recent Labs  Lab 10/02/21 0528 10/02/21 1205 10/02/21 1726 10/02/21 2356 10/03/21 0511  GLUCAP 131* 136* 106* 142* 113*    Recent Results (from the past 240 hour(s))  Resp Panel by RT-PCR (Flu A&B, Covid) Nasopharyngeal Swab     Status: None   Collection Time: 09/28/21  3:26 PM   Specimen: Nasopharyngeal Swab; Nasopharyngeal(NP) swabs in vial transport medium  Result Value Ref Range Status   SARS Coronavirus 2 by RT PCR NEGATIVE NEGATIVE Final    Comment: (NOTE) SARS-CoV-2 target nucleic acids are NOT DETECTED.  The SARS-CoV-2 RNA is generally detectable in upper respiratory specimens during the acute phase of infection. The lowest concentration of SARS-CoV-2 viral copies this assay can detect is 138 copies/mL. A negative result does not preclude SARS-Cov-2 infection and should not be used as the sole basis for treatment or other patient management decisions. A negative result may occur with  improper specimen collection/handling, submission of specimen other than nasopharyngeal swab, presence of viral mutation(s) within the areas targeted by this assay, and inadequate number of viral copies(<138 copies/mL). A negative result must be combined with clinical observations, patient history, and  epidemiological information. The expected result is Negative.  Fact Sheet for Patients:  EntrepreneurPulse.com.au  Fact Sheet for Healthcare Providers:  IncredibleEmployment.be  This test is no t yet approved or cleared by the [place] FDA and  has been authorized for detection and/or diagnosis of SARS-CoV-2 by FDA under an Emergency Use Authorization (EUA). This EUA will remain  in effect (meaning this test can be used) for the duration of the COVID-19 declaration under Section 564(b)(1) of the Act, 21 U.S.C.section 360bbb-3(b)(1), unless the authorization is terminated  or revoked sooner.       Influenza A by PCR NEGATIVE NEGATIVE Final   Influenza B by PCR NEGATIVE NEGATIVE Final    Comment: (NOTE) The Xpert Xpress SARS-CoV-2/FLU/RSV plus assay is intended as an aid in the diagnosis of influenza from Nasopharyngeal swab specimens and should not be used as a sole basis for treatment. Nasal washings and aspirates are unacceptable for Xpert Xpress SARS-CoV-2/FLU/RSV testing.  Fact Sheet for Patients: EntrepreneurPulse.com.au  Fact Sheet for Healthcare Providers: IncredibleEmployment.be  This test is not yet approved or cleared by the [place] FDA and has been authorized for detection and/or diagnosis of SARS-CoV-2 by FDA under an Emergency Use Authorization (EUA). This EUA will remain in effect (meaning this test can be used) for the duration of the COVID-19 declaration under Section 564(b)(1) of the Act, 21 U.S.C. section 360bbb-3(b)(1), unless the authorization is terminated or revoked.  Performed at [facility], [place] [address] [place], [place] [address]   Resp Panel by RT-PCR (Flu A&B, Covid) Nasopharyngeal Swab     Status: None   Collection Time: 09/29/21 12:01 PM   Specimen: Nasopharyngeal Swab; Nasopharyngeal(NP) swabs in vial transport medium  Result Value Ref  Range Status   SARS Coronavirus 2 by RT PCR NEGATIVE NEGATIVE Final    Comment: (NOTE) SARS-CoV-2 target nucleic acids are NOT DETECTED.  The SARS-CoV-2 RNA is generally detectable in upper respiratory specimens during the acute phase of infection. The lowest concentration of SARS-CoV-2 viral copies this assay can detect is 138 copies/mL. A negative result does not preclude SARS-Cov-2 infection and should not be used as the sole basis for treatment or other patient management decisions. A negative result may occur with  improper specimen collection/handling, submission of specimen other  than nasopharyngeal swab, presence of viral mutation(s) within the areas targeted by this assay, and inadequate number of viral copies(<138 copies/mL). A negative result must be combined with clinical observations, patient history, and epidemiological information. The expected result is Negative.  Fact Sheet for Patients:  EntrepreneurPulse.com.au  Fact Sheet for Healthcare Providers:  IncredibleEmployment.be  This test is no t yet approved or cleared by the [place] FDA and  has been authorized for detection and/or diagnosis of SARS-CoV-2 by FDA under an Emergency Use Authorization (EUA). This EUA will remain  in effect (meaning this test can be used) for the duration of the COVID-19 declaration under Section 564(b)(1) of the Act, 21 U.S.C.section 360bbb-3(b)(1), unless the authorization is terminated  or revoked sooner.       Influenza A by PCR NEGATIVE NEGATIVE Final   Influenza B by PCR NEGATIVE NEGATIVE Final    Comment: (NOTE) The Xpert Xpress SARS-CoV-2/FLU/RSV plus assay is intended as an aid in the diagnosis of influenza from Nasopharyngeal swab specimens and should not be used as a sole basis for treatment. Nasal washings and aspirates are unacceptable for Xpert Xpress SARS-CoV-2/FLU/RSV testing.  Fact Sheet for  Patients: EntrepreneurPulse.com.au  Fact Sheet for Healthcare Providers: IncredibleEmployment.be  This test is not yet approved or cleared by the [place] FDA and has been authorized for detection and/or diagnosis of SARS-CoV-2 by FDA under an Emergency Use Authorization (EUA). This EUA will remain in effect (meaning this test can be used) for the duration of the COVID-19 declaration under Section 564(b)(1) of the Act, 21 U.S.C. section 360bbb-3(b)(1), unless the authorization is terminated or revoked.  Performed at [facility], [place] [address] [place], [place] [address]       Studies: No results found.   Flora Lipps, MD  [facility] 10/03/2021  If 7PM-7AM, please contact night-coverage

## 2021-10-03 NOTE — Progress Notes (Signed)
Patient c/o dyspnea. Nurse needed suppor with pleurx and clarification on orders. PleurEvac drain placed and later RN able to drain1L CR with improvement  CCM signed off    SIGNATURE    Dr. Brand Males, M.D., F.C.C.P,  Pulmonary and Critical Care Medicine Staff Physician, [place] Director - Interstitial Lung Disease  Program  Pulmonary [place] [facility] [place], [place], [address]  NPI Number:  NPI #[id number]  Pager: [phone], If no answer  -> Check AMION or Try [phone] Telephone (clinical office): [phone] Telephone (research): [phone]  6:32 PM 10/03/2021

## 2021-10-03 NOTE — Progress Notes (Signed)
°   10/03/21 1800  Charting Type  Charting Type Reassessment  Chest Tube 1 Lateral;Left Pleural  Placement Date/Time: 09/29/21 1630   Inserted prior to hospital arrival?: No  Tube Number: 1  Chest Tube Orientation: Lateral;Left  Chest Tube Location: Pleural  Chest Tube Drainage System: Suction  Status Clamped  Drainage Description Serosanguineous  Dressing Status Clean;Dry;Intact  Dressing Intervention New dressing  Site Assessment Clean;Dry;Intact  Surrounding Skin Intact  Output (mL) 100 mL   Pleurex drain output was 100 cc and dressing was changed.  Layla Maw, RN

## 2021-10-04 DIAGNOSIS — D638 Anemia in other chronic diseases classified elsewhere: Secondary | ICD-10-CM | POA: Diagnosis not present

## 2021-10-04 DIAGNOSIS — J90 Pleural effusion, not elsewhere classified: Secondary | ICD-10-CM | POA: Diagnosis not present

## 2021-10-04 DIAGNOSIS — I480 Paroxysmal atrial fibrillation: Secondary | ICD-10-CM | POA: Diagnosis not present

## 2021-10-04 DIAGNOSIS — C3492 Malignant neoplasm of unspecified part of left bronchus or lung: Secondary | ICD-10-CM | POA: Diagnosis not present

## 2021-10-04 LAB — BASIC METABOLIC PANEL
Anion gap: 7 (ref 5–15)
BUN: 13 mg/dL (ref 8–23)
CO2: 29 mmol/L (ref 22–32)
Calcium: 8.3 mg/dL — ABNORMAL LOW (ref 8.9–10.3)
Chloride: 97 mmol/L — ABNORMAL LOW (ref 98–111)
Creatinine, Ser: 0.31 mg/dL — ABNORMAL LOW (ref 0.44–1.00)
GFR, Estimated: >60 mL/min (ref >60)
Glucose, Bld: 79 mg/dL (ref 70–99)
Potassium: 4.4 mmol/L (ref 3.5–5.1)
Sodium: 133 mmol/L — ABNORMAL LOW (ref 135–145)

## 2021-10-04 LAB — CBC
HCT: 24.6 % — ABNORMAL LOW (ref 36.0–46.0)
Hemoglobin: 7.6 g/dL — ABNORMAL LOW (ref 12.0–15.0)
MCH: 29.7 pg (ref 26.0–34.0)
MCHC: 30.9 g/dL (ref 30.0–36.0)
MCV: 96.1 fL (ref 80.0–100.0)
Platelets: 489 10*3/uL — ABNORMAL HIGH (ref 150–400)
RBC: 2.56 MIL/uL — ABNORMAL LOW (ref 3.87–5.11)
RDW: 22.6 % — ABNORMAL HIGH (ref 11.5–15.5)
WBC: 37.7 10*3/uL — ABNORMAL HIGH (ref 4.0–10.5)
nRBC: 0.5 % — ABNORMAL HIGH (ref 0.0–0.2)

## 2021-10-04 LAB — GLUCOSE, CAPILLARY
Glucose-Capillary: 115 mg/dL — ABNORMAL HIGH (ref 70–99)
Glucose-Capillary: 147 mg/dL — ABNORMAL HIGH (ref 70–99)
Glucose-Capillary: 72 mg/dL (ref 70–99)
Glucose-Capillary: 85 mg/dL (ref 70–99)

## 2021-10-04 LAB — MAGNESIUM: Magnesium: 1.3 mg/dL — ABNORMAL LOW (ref 1.7–2.4)

## 2021-10-04 MED ORDER — MAGNESIUM SULFATE 2 GM/50ML IV SOLN
2.0000 g | Freq: Once | INTRAVENOUS | Status: AC
  Administered 2021-10-04: 2 g via INTRAVENOUS
  Filled 2021-10-04: qty 50

## 2021-10-04 NOTE — Progress Notes (Signed)
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  Of note, patient was recently admitted hospital for atrial fibrillation with RVR and a loculated pleural effusion with left lower extremity DVT.  On this admission, patient was on nonrebreather mask on arrival.  Patient was mildly tachycardic and was saturating 100% on nonrebreather mask at 10 L/min.  Patient was given duo nebs and was admitted hospital.  COVID and influenza was negative.  WBC showed a white count of 33.9.  Albumin was 1.7.  Portable chest x-ray showed complete hyperexpansion of the left hemithorax with rightward shift of the metastasis structures due to large pleural effusion.  Patient was then admitted hospital for further evaluation and treatment.  Pulmonary was urgently consulted.  Patient underwent tunneled Pleurx cath placement on 09/29/2021.  Assessment/Plan:  Principal Problem:   Pleural effusion on left Active Problems:   Generalized anxiety disorder   Hyperlipidemia   Type 2 diabetes mellitus (HCC)   Adenocarcinoma of left lung, stage 4 (HCC)   Pleural effusion   A-fib (HCC)   Protein-calorie malnutrition, severe   Pressure injury of skin   Leukocytosis   Anemia of chronic disease   Left leg DVT (HCC)   Lateral left-sided malignant pleural effusion.  History of stage IV lung cancer.  Seen by pulmonary and status post Pleurx  catheter placement.  Patient will be seen by home health on discharge for Pleurx catheter management.  Plan is to drain Pleurx catheter every other day with maximum of 1 L at 1 time.  Cancer related pain.  on fentanyl patch and oxycodone.   Constipation. Added MiraLAX through the PEG tube.  Continue Dulcolax suppository  Hypomagnesemia.  We will continue to replenish aggressively.  We will give 2 g IV and continue magnesium oxide as well.    Leukocytosis Likely secondary to recent Granix administration.  No obvious fever or chills.  Latest WBC at 37.7.  She is afebrile at this time.  Patient has constipation not diarrhea at this time.  Unsure of the significance of leukocytosis.     Anemia of chronic disease   Secondary to malignancy and chemotherapy, we will continue to monitor.  Latest hemoglobin of 7 6.    Left leg DVT  Continue Lovenox therapeutic dose.     Generalized anxiety disorder Continue mirtazapine and sertraline     Hyperlipidemia On Lipitor at home.  Diabetes mellitus type 2.   On metformin at home.  We will continue with sliding scale insulin. Diabetic diet.  Latest POC glucose of 115    Paroxysmal A-fib CHA2DS2-VASc score of 2.  Continue amiodarone, enoxaparin and metoprolol.  Heart rate controlled     Protein-calorie malnutrition, severe Continue tube feeding.     Pressure injury of skin Present on admission.  Continue local wound care.  Disposition: patient was at [facility] skilled nursing facility.  At this time patient is considering home with home health.  Likely home after the weekend when home health is arranged and leukocytosis trending down.  DVT prophylaxis:  Lovenox subcu therapeutic dose  Code Status: DNR  Family Communication:  I again spoke with the patient's family at bedside on 10/04/2021.  Status is: inpatient  The patient is inpatient because: Chest tube placement, metastatic lung cancer, awaiting for home health managing chest  tube  Consultants: PCCM  Procedures: Left chest wall tunneled Pleurx catheter placement on 09/29/2021  Anti-infectives:  none  Anti-infectives (From admission, onward)    None      Subjective: Today, patient was seen and examined at bedside.  Complains of generalized weakness.  No nausea vomiting.  Has overall mild pain but is controlled.   Objective: Vitals:   10/03/21 2041 10/04/21 0500  BP: 110/69 105/61  Pulse: 99 91  Resp: 18 18  Temp: 97.7 F (36.5 C) 97.9 F (36.6 C)  SpO2: 95% 100%    Intake/Output Summary (Last 24 hours) at 10/04/2021 1151 Last data filed at 10/04/2021 0507 Gross per 24 hour  Intake 540 ml  Output 1000 ml  Net -460 ml    Filed Weights   10/01/21 0500 10/02/21 0500 10/03/21 0500  Weight: 57.4 kg 56.4 kg 57 kg   Body mass index is 20.91 kg/m.   Physical Exam:  General: Thinly built, frail, deconditioned, on nasal cannula oxygen  HENT:   There is an pallor noted.  Mild icterus noted.. Oral mucosa is moist.  Chest:    Diminished breath sounds bilaterally.  Left sided tunneled chest tube in place. CVS: S1 &S2 heard. No murmur.  Regular rate and rhythm. Abdomen: Soft, nontender, nondistended.  Bowel sounds are heard.   Extremities: No cyanosis, clubbing or edema.  Peripheral pulses are palpable. Psych: Alert, awake and communicative. CNS:  No cranial nerve deficits.  Generalized weakness noted Skin: Warm and dry.  No rashes noted.   Data Review: I have personally reviewed the following laboratory data and studies,  CBC: Recent Labs  Lab 09/29/21 1247 09/30/21 0856 10/01/21 0448 10/02/21 0525 10/04/21 0525  WBC 33.9* 35.8* 29.1* 30.6* 37.7*  NEUTROABS 30.6*  --   --   --   --   HGB 8.2* 7.6* 7.5* 7.4* 7.6*  HCT 27.0* 24.4* 24.2* 23.5* 24.6*  MCV 97.1 95.7 95.3 94.4 96.1  PLT 679* 662* 606* 564* 489*    Basic Metabolic Panel: Recent Labs  Lab 09/28/21 0434 09/29/21 1247 09/29/21 1648 09/30/21 0856 10/01/21 0448  10/02/21 0525 10/04/21 0525  NA  --  136  --  139 135 133* 133*  K  --  4.7  --  4.4 4.4 4.6 4.4  CL  --  99  --  103 102 101 97*  CO2  --  29  --  29 28 25 29   GLUCOSE  --  182*  --  136* 124* 132* 79  BUN  --  17  --  17 15 13 13   CREATININE  --  0.42*  --  <0.30* 0.35* 0.37* 0.31*  CALCIUM  --  8.1*  --  8.2* 7.9* 8.0* 8.3*  MG 1.7  --  1.5*  --  1.5* 1.3* 1.3*  PHOS  --   --  4.0  --  2.6  --   --     Liver Function Tests: Recent Labs  Lab 09/29/21 1247 09/30/21 0856 10/01/21 0448  AST 31 50* 142*  ALT 27 34 100*  ALKPHOS 311* 258* 272*  BILITOT 0.5 0.2* 0.3  PROT 6.4* 5.4* 5.2*  ALBUMIN 1.7* <1.5* 1.5*    No results for  input(s): LIPASE, AMYLASE in the last 168 hours. No results for input(s): AMMONIA in the last 168 hours. Cardiac Enzymes: No results for input(s): CKTOTAL, CKMB, CKMBINDEX, TROPONINI in the last 168 hours. BNP (last 3 results) Recent Labs    09/29/21 1247  BNP 255.9*     ProBNP (last 3 results) No results for input(s): PROBNP in the last 8760 hours.  CBG: Recent Labs  Lab 10/03/21 1124 10/03/21 1758 10/04/21 0017 10/04/21 0559 10/04/21 1110  GLUCAP 134* 147* 72 85 115*    Recent Results (from the past 240 hour(s))  Resp Panel by RT-PCR (Flu A&B, Covid) Nasopharyngeal Swab     Status: None   Collection Time: 09/28/21  3:26 PM   Specimen: Nasopharyngeal Swab; Nasopharyngeal(NP) swabs in vial transport medium  Result Value Ref Range Status   SARS Coronavirus 2 by RT PCR NEGATIVE NEGATIVE Final    Comment: (NOTE) SARS-CoV-2 target nucleic acids are NOT DETECTED.  The SARS-CoV-2 RNA is generally detectable in upper respiratory specimens during the acute phase of infection. The lowest concentration of SARS-CoV-2 viral copies this assay can detect is 138 copies/mL. A negative result does not preclude SARS-Cov-2 infection and should not be used as the sole basis for treatment or other patient management decisions. A negative result may  occur with  improper specimen collection/handling, submission of specimen other than nasopharyngeal swab, presence of viral mutation(s) within the areas targeted by this assay, and inadequate number of viral copies(<138 copies/mL). A negative result must be combined with clinical observations, patient history, and epidemiological information. The expected result is Negative.  Fact Sheet for Patients:  EntrepreneurPulse.com.au  Fact Sheet for Healthcare Providers:  IncredibleEmployment.be  This test is no t yet approved or cleared by the [place] FDA and  has been authorized for detection and/or diagnosis of SARS-CoV-2 by FDA under an Emergency Use Authorization (EUA). This EUA will remain  in effect (meaning this test can be used) for the duration of the COVID-19 declaration under Section 564(b)(1) of the Act, 21 U.S.C.section 360bbb-3(b)(1), unless the authorization is terminated  or revoked sooner.       Influenza A by PCR NEGATIVE NEGATIVE Final   Influenza B by PCR NEGATIVE NEGATIVE Final    Comment: (NOTE) The Xpert Xpress SARS-CoV-2/FLU/RSV plus assay is intended as an aid in the diagnosis of influenza from Nasopharyngeal swab specimens and should not be used as a sole basis for treatment. Nasal washings and aspirates are unacceptable for Xpert Xpress SARS-CoV-2/FLU/RSV testing.  Fact Sheet for Patients: EntrepreneurPulse.com.au  Fact Sheet for Healthcare Providers: IncredibleEmployment.be  This test is not yet approved or cleared by the [place] FDA and has been authorized for detection and/or diagnosis of SARS-CoV-2 by FDA under an Emergency Use Authorization (EUA). This EUA will remain in effect (meaning this test can be used) for the duration of the COVID-19 declaration under Section 564(b)(1) of the Act, 21 U.S.C. section 360bbb-3(b)(1), unless the authorization is terminated  or revoked.  Performed at [facility], [place] [address] [place], [place] [address]   Resp Panel by RT-PCR (Flu A&B, Covid) Nasopharyngeal Swab     Status: None   Collection Time: 09/29/21 12:01 PM   Specimen: Nasopharyngeal Swab; Nasopharyngeal(NP) swabs in vial transport medium  Result Value Ref Range Status   SARS Coronavirus 2 by RT PCR NEGATIVE NEGATIVE Final    Comment: (NOTE) SARS-CoV-2 target nucleic acids are NOT DETECTED.  The SARS-CoV-2 RNA is generally detectable in upper respiratory specimens during  the acute phase of infection. The lowest concentration of SARS-CoV-2 viral copies this assay can detect is 138 copies/mL. A negative result does not preclude SARS-Cov-2 infection and should not be used as the sole basis for treatment or other patient management decisions. A negative result may occur with  improper specimen collection/handling, submission of specimen other than nasopharyngeal swab, presence of viral mutation(s) within the areas targeted by this assay, and inadequate number of viral copies(<138 copies/mL). A negative result must be combined with clinical observations, patient history, and epidemiological information. The expected result is Negative.  Fact Sheet for Patients:  EntrepreneurPulse.com.au  Fact Sheet for Healthcare Providers:  IncredibleEmployment.be  This test is no t yet approved or cleared by the [place] FDA and  has been authorized for detection and/or diagnosis of SARS-CoV-2 by FDA under an Emergency Use Authorization (EUA). This EUA will remain  in effect (meaning this test can be used) for the duration of the COVID-19 declaration under Section 564(b)(1) of the Act, 21 U.S.C.section 360bbb-3(b)(1), unless the authorization is terminated  or revoked sooner.       Influenza A by PCR NEGATIVE NEGATIVE Final   Influenza B by PCR NEGATIVE NEGATIVE Final    Comment: (NOTE) The  Xpert Xpress SARS-CoV-2/FLU/RSV plus assay is intended as an aid in the diagnosis of influenza from Nasopharyngeal swab specimens and should not be used as a sole basis for treatment. Nasal washings and aspirates are unacceptable for Xpert Xpress SARS-CoV-2/FLU/RSV testing.  Fact Sheet for Patients: EntrepreneurPulse.com.au  Fact Sheet for Healthcare Providers: IncredibleEmployment.be  This test is not yet approved or cleared by the [place] FDA and has been authorized for detection and/or diagnosis of SARS-CoV-2 by FDA under an Emergency Use Authorization (EUA). This EUA will remain in effect (meaning this test can be used) for the duration of the COVID-19 declaration under Section 564(b)(1) of the Act, 21 U.S.C. section 360bbb-3(b)(1), unless the authorization is terminated or revoked.  Performed at [facility], [place] [address], [place], [place] [address]       Studies: DG CHEST PORT 1 VIEW  Result Date: 10/03/2021 CLINICAL DATA:  Lung carcinoma. Left pleural effusion. Aspiration pneumonia. Follow-up exam. EXAM: PORTABLE CHEST 1 VIEW COMPARISON:  10/01/2021. FINDINGS: Left chest tube is stable. No visible pneumothorax. Ill-defined nodule at the left apex is stable. Confluent opacity in the left mid to lower lung similar to the prior study. Hazy opacity noted in the left upper lung on the prior study has improved. Mild hazy opacity in the right mid to lower lung similar to the prior study. Remainder of the right lung is clear. No convincing right pleural effusion.  No right pneumothorax. IMPRESSION: 1. Mild decrease in hazy opacity in the left upper lung compared to the most recent prior study. 2. No other change. Persistent confluent opacity in the left mid to lower lung. Stable left chest tube. No visible pneumothorax. Electronically Signed   By: Lajean Manes M.D.   On: 10/03/2021 19:40     Flora Lipps, MD  [facility] 10/04/2021  If 7PM-7AM, please contact night-coverage

## 2021-10-04 NOTE — Progress Notes (Signed)
Attempted to review teaching for chest tube care, peg care and foley care, pt stated that husband, friend and herself would be managing care. Pt specifically asked to review peg tube. During review of all the components patient stated that it was a lot of information and that she can not always remember everything. Pt states she would like to go home with home care put isn't sure if she could learn general knowledge care for equipment at this time. No support system present during education.

## 2021-10-05 DIAGNOSIS — C3492 Malignant neoplasm of unspecified part of left bronchus or lung: Secondary | ICD-10-CM | POA: Diagnosis not present

## 2021-10-05 DIAGNOSIS — D638 Anemia in other chronic diseases classified elsewhere: Secondary | ICD-10-CM | POA: Diagnosis not present

## 2021-10-05 DIAGNOSIS — I480 Paroxysmal atrial fibrillation: Secondary | ICD-10-CM | POA: Diagnosis not present

## 2021-10-05 DIAGNOSIS — J90 Pleural effusion, not elsewhere classified: Secondary | ICD-10-CM | POA: Diagnosis not present

## 2021-10-05 LAB — CBC
HCT: 26.2 % — ABNORMAL LOW (ref 36.0–46.0)
Hemoglobin: 8 g/dL — ABNORMAL LOW (ref 12.0–15.0)
MCH: 30 pg (ref 26.0–34.0)
MCHC: 30.5 g/dL (ref 30.0–36.0)
MCV: 98.1 fL (ref 80.0–100.0)
Platelets: 524 10*3/uL — ABNORMAL HIGH (ref 150–400)
RBC: 2.67 MIL/uL — ABNORMAL LOW (ref 3.87–5.11)
RDW: 22.8 % — ABNORMAL HIGH (ref 11.5–15.5)
WBC: 35.8 10*3/uL — ABNORMAL HIGH (ref 4.0–10.5)
nRBC: 0.3 % — ABNORMAL HIGH (ref 0.0–0.2)

## 2021-10-05 LAB — BASIC METABOLIC PANEL
Anion gap: 8 (ref 5–15)
BUN: 11 mg/dL (ref 8–23)
CO2: 29 mmol/L (ref 22–32)
Calcium: 8.4 mg/dL — ABNORMAL LOW (ref 8.9–10.3)
Chloride: 94 mmol/L — ABNORMAL LOW (ref 98–111)
Creatinine, Ser: 0.3 mg/dL — ABNORMAL LOW (ref 0.44–1.00)
GFR, Estimated: >60 mL/min (ref >60)
Glucose, Bld: 113 mg/dL — ABNORMAL HIGH (ref 70–99)
Potassium: 4.4 mmol/L (ref 3.5–5.1)
Sodium: 131 mmol/L — ABNORMAL LOW (ref 135–145)

## 2021-10-05 LAB — GLUCOSE, CAPILLARY
Glucose-Capillary: 103 mg/dL — ABNORMAL HIGH (ref 70–99)
Glucose-Capillary: 105 mg/dL — ABNORMAL HIGH (ref 70–99)
Glucose-Capillary: 127 mg/dL — ABNORMAL HIGH (ref 70–99)
Glucose-Capillary: 145 mg/dL — ABNORMAL HIGH (ref 70–99)

## 2021-10-05 LAB — MAGNESIUM: Magnesium: 1.5 mg/dL — ABNORMAL LOW (ref 1.7–2.4)

## 2021-10-05 LAB — PHOSPHORUS: Phosphorus: 3.7 mg/dL (ref 2.5–4.6)

## 2021-10-05 NOTE — Progress Notes (Signed)
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  Of note, patient was recently admitted hospital for atrial fibrillation with RVR and a loculated pleural effusion with left lower extremity DVT.  On this admission, patient was on nonrebreather mask on arrival.  Patient was mildly tachycardic and was saturating 100% on nonrebreather mask at 10 L/min.  Patient was given duo nebs and was admitted hospital.  COVID and influenza was negative.  WBC showed a white count of 33.9.  Albumin was 1.7.  Portable chest x-ray showed complete hyperexpansion of the left hemithorax with rightward shift of the metastasis structures due to large pleural effusion.  Patient was then admitted hospital for further evaluation and treatment.  Pulmonary was urgently consulted.  Patient underwent tunneled Pleurx cath placement on 09/29/2021.  Assessment/Plan:  Principal Problem:   Pleural effusion on left Active Problems:   Generalized anxiety disorder   Hyperlipidemia   Type 2 diabetes mellitus (HCC)   Adenocarcinoma of left lung, stage 4 (HCC)   Pleural effusion   A-fib (HCC)   Protein-calorie malnutrition, severe   Pressure injury of skin   Leukocytosis   Anemia of chronic disease   Left leg DVT (HCC)   Lateral left-sided malignant pleural effusion.  History of stage IV lung cancer.  Seen by pulmonary and status post Pleurx  catheter placement.  Patient will be seen by home health on discharge for Pleurx catheter management.  Plan is to drain Pleurx catheter every other day with maximum of 1 L at 1 time.  Cancer related pain.  on fentanyl patch and oxycodone.   Constipation. Added MiraLAX through the PEG tube.  Continue Dulcolax suppository  Hypomagnesemia.  We will continue to replenish aggressively.  We will give 2 g IV and continue magnesium oxide as well.    Leukocytosis Likely secondary to recent Granix administration.  No obvious fever or chills.  Latest WBC at 30 5K.  She is afebrile at this time.       Anemia of chronic disease   Secondary to malignancy and chemotherapy, we will continue to monitor.  Latest hemoglobin of 8.0.    Left leg DVT  Continue Lovenox therapeutic dose.     Generalized anxiety disorder Continue mirtazapine and sertraline     Hyperlipidemia On Lipitor at home.  Diabetes mellitus type 2.   On metformin at home.  We will continue with sliding scale insulin. Diabetic diet.  Latest POC glucose of 103.    Paroxysmal A-fib CHA2DS2-VASc score of 2.  Continue amiodarone, enoxaparin and metoprolol.  Heart rate controlled     Protein-calorie malnutrition, severe Continue tube feeding.  Will need tube feeding at home.     Pressure injury of skin Present on admission.  Continue local wound care.  Disposition: patient was at [facility] skilled nursing facility.  At this time patient is considering home with home health.  Likely home tomorrow when home health is arranged and education provided to the patient.  DVT prophylaxis:   Lovenox subcu therapeutic dose  Code Status: DNR  Family Communication:  I again spoke with the patient's family at bedside on 10/05/2021.  Status is: inpatient  The patient is inpatient because: Chest tube placement, metastatic lung cancer, awaiting for home health for Pleurx catheter management   Consultants: PCCM  Procedures: Left chest wall  tunneled Pleurx catheter placement on 09/29/2021  Anti-infectives:  none  Anti-infectives (From admission, onward)    None      Subjective: Today, patient was seen and examined at bedside.  Patient denies vomiting or abdominal pain.    Objective: Vitals:   10/05/21 0543 10/05/21 1158  BP: 110/67 103/60  Pulse: 93 (!) 58  Resp: 16 18  Temp: 97.7 F (36.5 C) 97.7 F (36.5 C)  SpO2: 100% 94%    Intake/Output Summary (Last 24 hours) at 10/05/2021 1448 Last data filed at 10/05/2021 0915 Gross per 24 hour  Intake 350 ml  Output 1200 ml  Net -850 ml    Filed Weights   10/02/21 0500 10/03/21 0500 10/05/21 0500  Weight: 56.4 kg 57 kg 53.2 kg   Body mass index is 19.52 kg/m.   Physical Exam:  General: Thinly built, frail, deconditioned.   HENT: Icterus noted.  Pallor noted... Oral mucosa is moist.  Chest:    Diminished breath sounds bilaterally.  Left-sided Pleurx catheter in place.   CVS: S1 &S2 heard. No murmur.  Regular rate and rhythm. Abdomen: Soft, nontender, nondistended.  Bowel sounds are heard.   Extremities: No cyanosis, clubbing or edema.  Peripheral pulses are palpable. Psych: Alert, awake and communicative. CNS:  No cranial nerve deficits.  Generalized weakness noted. Skin: Warm and dry.  No rashes noted.   Data Review: I have personally reviewed the following laboratory data and studies,  CBC: Recent Labs  Lab 09/29/21 1247 09/30/21 0856 10/01/21 0448 10/02/21 0525 10/04/21 0525 10/05/21 0809  WBC 33.9* 35.8* 29.1* 30.6* 37.7* 35.8*  NEUTROABS 30.6*  --   --   --   --   --   HGB 8.2* 7.6* 7.5* 7.4* 7.6* 8.0*  HCT 27.0* 24.4* 24.2* 23.5* 24.6* 26.2*  MCV 97.1 95.7 95.3 94.4 96.1 98.1  PLT 679* 662* 606* 564* 489* 524*    Basic Metabolic Panel: Recent Labs  Lab 09/29/21 1648 09/30/21 0856 10/01/21 0448 10/02/21 0525 10/04/21 0525 10/05/21 0809  NA  --  139 135 133* 133* 131*  K  --  4.4 4.4 4.6 4.4 4.4  CL  --  103 102 101 97* 94*   CO2  --  29 28 25 29 29   GLUCOSE  --  136* 124* 132* 79 113*  BUN  --  17 15 13 13 11   CREATININE  --  <0.30* 0.35* 0.37* 0.31* 0.30*  CALCIUM  --  8.2* 7.9* 8.0* 8.3* 8.4*  MG 1.5*  --  1.5* 1.3* 1.3* 1.5*  PHOS 4.0  --  2.6  --   --  3.7    Liver Function Tests: Recent Labs  Lab 09/29/21 1247 09/30/21 0856 10/01/21 0448  AST 31 50* 142*  ALT 27 34 100*  ALKPHOS 311* 258* 272*  BILITOT 0.5 0.2* 0.3  PROT 6.4* 5.4* 5.2*  ALBUMIN 1.7* <1.5* 1.5*    No results for input(s): LIPASE, AMYLASE in the last 168 hours. No results for input(s): AMMONIA in the last 168 hours. Cardiac Enzymes: No results for input(s): CKTOTAL, CKMB, CKMBINDEX, TROPONINI in the last 168 hours. BNP (last 3 results) Recent Labs    09/29/21 1247  BNP 255.9*     ProBNP (last 3 results) No results for input(s): PROBNP in the last 8760 hours.  CBG: Recent Labs  Lab 10/04/21 1110 10/04/21 1719 10/05/21 0012 10/05/21 0539 10/05/21 1155  GLUCAP 115* 147* 145* 105* 103*    Recent Results (from the past 240 hour(s))  Resp Panel by RT-PCR (Flu A&B, Covid) Nasopharyngeal Swab     Status: None   Collection Time: 09/28/21  3:26 PM   Specimen: Nasopharyngeal Swab; Nasopharyngeal(NP) swabs in vial transport medium  Result Value Ref Range Status   SARS Coronavirus 2 by RT PCR NEGATIVE NEGATIVE Final    Comment: (NOTE) SARS-CoV-2 target nucleic acids are NOT DETECTED.  The SARS-CoV-2 RNA is generally detectable in upper respiratory specimens during the acute phase of infection. The lowest concentration of SARS-CoV-2 viral copies this assay can detect is 138 copies/mL. A negative result does not preclude SARS-Cov-2 infection and should not be used as the sole basis for treatment or other patient management decisions. A negative result may occur with  improper specimen collection/handling, submission of specimen other than nasopharyngeal swab, presence of viral mutation(s) within the areas targeted  by this assay, and inadequate number of viral copies(<138 copies/mL). A negative result must be combined with clinical observations, patient history, and epidemiological information. The expected result is Negative.  Fact Sheet for Patients:  EntrepreneurPulse.com.au  Fact Sheet for Healthcare Providers:  IncredibleEmployment.be  This test is no t yet approved or cleared by the [place] FDA and  has been authorized for detection and/or diagnosis of SARS-CoV-2 by FDA under an Emergency Use Authorization (EUA). This EUA will remain  in effect (meaning this test can be used) for the duration of the COVID-19 declaration under Section 564(b)(1) of the Act, 21 U.S.C.section 360bbb-3(b)(1), unless the authorization is terminated  or revoked sooner.       Influenza A by PCR NEGATIVE NEGATIVE Final   Influenza B by PCR NEGATIVE NEGATIVE Final    Comment: (NOTE) The Xpert Xpress SARS-CoV-2/FLU/RSV plus assay is intended as an aid in the diagnosis of influenza from Nasopharyngeal swab specimens and should not be used as a sole basis for treatment. Nasal washings and aspirates are unacceptable for Xpert Xpress SARS-CoV-2/FLU/RSV testing.  Fact Sheet for Patients: EntrepreneurPulse.com.au  Fact Sheet for Healthcare Providers: IncredibleEmployment.be  This test is not yet approved or cleared by the [place] FDA and has been authorized for detection and/or diagnosis of SARS-CoV-2 by FDA under an Emergency Use Authorization (EUA). This EUA will remain in effect (meaning this test can be used) for the duration of the COVID-19 declaration under Section 564(b)(1) of the Act, 21 U.S.C. section 360bbb-3(b)(1), unless the authorization is terminated or revoked.  Performed at [facility], [place] [address], [place], [place] [address]   Resp Panel by RT-PCR (Flu A&B, Covid) Nasopharyngeal Swab      Status: None   Collection Time: 09/29/21 12:01 PM   Specimen: Nasopharyngeal Swab; Nasopharyngeal(NP) swabs in vial transport medium  Result Value Ref Range Status   SARS Coronavirus 2 by RT PCR NEGATIVE NEGATIVE Final    Comment: (NOTE) SARS-CoV-2 target nucleic acids are NOT DETECTED.  The SARS-CoV-2 RNA is generally detectable in upper respiratory specimens during the acute phase of infection. The lowest concentration of SARS-CoV-2 viral copies this assay can detect is 138 copies/mL. A negative result does not preclude SARS-Cov-2 infection and should not be used as the sole basis for treatment or other patient management decisions. A negative  result may occur with  improper specimen collection/handling, submission of specimen other than nasopharyngeal swab, presence of viral mutation(s) within the areas targeted by this assay, and inadequate number of viral copies(<138 copies/mL). A negative result must be combined with clinical observations, patient history, and epidemiological information. The expected result is Negative.  Fact Sheet for Patients:  EntrepreneurPulse.com.au  Fact Sheet for Healthcare Providers:  IncredibleEmployment.be  This test is no t yet approved or cleared by the [place] FDA and  has been authorized for detection and/or diagnosis of SARS-CoV-2 by FDA under an Emergency Use Authorization (EUA). This EUA will remain  in effect (meaning this test can be used) for the duration of the COVID-19 declaration under Section 564(b)(1) of the Act, 21 U.S.C.section 360bbb-3(b)(1), unless the authorization is terminated  or revoked sooner.       Influenza A by PCR NEGATIVE NEGATIVE Final   Influenza B by PCR NEGATIVE NEGATIVE Final    Comment: (NOTE) The Xpert Xpress SARS-CoV-2/FLU/RSV plus assay is intended as an aid in the diagnosis of influenza from Nasopharyngeal swab specimens and should not be used as a sole basis  for treatment. Nasal washings and aspirates are unacceptable for Xpert Xpress SARS-CoV-2/FLU/RSV testing.  Fact Sheet for Patients: EntrepreneurPulse.com.au  Fact Sheet for Healthcare Providers: IncredibleEmployment.be  This test is not yet approved or cleared by the [place] FDA and has been authorized for detection and/or diagnosis of SARS-CoV-2 by FDA under an Emergency Use Authorization (EUA). This EUA will remain in effect (meaning this test can be used) for the duration of the COVID-19 declaration under Section 564(b)(1) of the Act, 21 U.S.C. section 360bbb-3(b)(1), unless the authorization is terminated or revoked.  Performed at [facility], [place] [address], [place], [place] [address]       Studies: DG CHEST PORT 1 VIEW  Result Date: 10/03/2021 CLINICAL DATA:  Lung carcinoma. Left pleural effusion. Aspiration pneumonia. Follow-up exam. EXAM: PORTABLE CHEST 1 VIEW COMPARISON:  10/01/2021. FINDINGS: Left chest tube is stable. No visible pneumothorax. Ill-defined nodule at the left apex is stable. Confluent opacity in the left mid to lower lung similar to the prior study. Hazy opacity noted in the left upper lung on the prior study has improved. Mild hazy opacity in the right mid to lower lung similar to the prior study. Remainder of the right lung is clear. No convincing right pleural effusion.  No right pneumothorax. IMPRESSION: 1. Mild decrease in hazy opacity in the left upper lung compared to the most recent prior study. 2. No other change. Persistent confluent opacity in the left mid to lower lung. Stable left chest tube. No visible pneumothorax. Electronically Signed   By: Lajean Manes M.D.   On: 10/03/2021 19:40     Flora Lipps, MD  [facility] 10/05/2021  If 7PM-7AM, please contact night-coverage

## 2021-10-05 NOTE — Progress Notes (Signed)
Attempted to teach patient more about peg tube teaching, pt stated she wanted to wait for her family members to come to learn. Will attempt again when family is present.

## 2021-10-05 NOTE — Plan of Care (Signed)
°  Problem: Education: Goal: Knowledge of General Education information will improve Description: Including pain rating scale, medication(s)/side effects and non-pharmacologic comfort measures Outcome: Progressing   Problem: Clinical Measurements: Goal: Ability to maintain clinical measurements within normal limits will improve Outcome: Progressing Goal: Cardiovascular complication will be avoided Outcome: Progressing   Problem: Coping: Goal: Level of anxiety will decrease Outcome: Progressing   Problem: Pain Managment: Goal: General experience of comfort will improve Outcome: Progressing

## 2021-10-05 NOTE — Progress Notes (Signed)
1645 Demonstrated/Educated patient and patient's husband regarding plurex catheter care on how to drain and keep clean. Done with charge RN. Both patient and husband observed. No questions were asked. Patient stated she didn't fully understand, Husband left room. Will continue to reinforce.

## 2021-10-06 DIAGNOSIS — I480 Paroxysmal atrial fibrillation: Secondary | ICD-10-CM | POA: Diagnosis not present

## 2021-10-06 DIAGNOSIS — J90 Pleural effusion, not elsewhere classified: Secondary | ICD-10-CM | POA: Diagnosis not present

## 2021-10-06 DIAGNOSIS — D638 Anemia in other chronic diseases classified elsewhere: Secondary | ICD-10-CM | POA: Diagnosis not present

## 2021-10-06 DIAGNOSIS — C3492 Malignant neoplasm of unspecified part of left bronchus or lung: Secondary | ICD-10-CM | POA: Diagnosis not present

## 2021-10-06 LAB — BASIC METABOLIC PANEL
Anion gap: 7 (ref 5–15)
BUN: 11 mg/dL (ref 8–23)
CO2: 30 mmol/L (ref 22–32)
Calcium: 7.9 mg/dL — ABNORMAL LOW (ref 8.9–10.3)
Chloride: 95 mmol/L — ABNORMAL LOW (ref 98–111)
Creatinine, Ser: 0.3 mg/dL — ABNORMAL LOW (ref 0.44–1.00)
GFR, Estimated: >60 mL/min (ref >60)
Glucose, Bld: 163 mg/dL — ABNORMAL HIGH (ref 70–99)
Potassium: 4 mmol/L (ref 3.5–5.1)
Sodium: 132 mmol/L — ABNORMAL LOW (ref 135–145)

## 2021-10-06 LAB — GLUCOSE, CAPILLARY
Glucose-Capillary: 105 mg/dL — ABNORMAL HIGH (ref 70–99)
Glucose-Capillary: 119 mg/dL — ABNORMAL HIGH (ref 70–99)
Glucose-Capillary: 163 mg/dL — ABNORMAL HIGH (ref 70–99)
Glucose-Capillary: 78 mg/dL (ref 70–99)
Glucose-Capillary: 85 mg/dL (ref 70–99)

## 2021-10-06 LAB — CBC
HCT: 25.3 % — ABNORMAL LOW (ref 36.0–46.0)
Hemoglobin: 8 g/dL — ABNORMAL LOW (ref 12.0–15.0)
MCH: 30 pg (ref 26.0–34.0)
MCHC: 31.6 g/dL (ref 30.0–36.0)
MCV: 94.8 fL (ref 80.0–100.0)
Platelets: 556 10*3/uL — ABNORMAL HIGH (ref 150–400)
RBC: 2.67 MIL/uL — ABNORMAL LOW (ref 3.87–5.11)
RDW: 22.3 % — ABNORMAL HIGH (ref 11.5–15.5)
WBC: 31.6 10*3/uL — ABNORMAL HIGH (ref 4.0–10.5)
nRBC: 0.3 % — ABNORMAL HIGH (ref 0.0–0.2)

## 2021-10-06 MED ORDER — FENTANYL 50 MCG/HR TD PT72
1.0000 | MEDICATED_PATCH | TRANSDERMAL | 0 refills | Status: AC
Start: 2021-10-06 — End: 2022-10-06

## 2021-10-06 MED ORDER — PROCHLORPERAZINE MALEATE 10 MG PO TABS: 10.0000 mg | ORAL_TABLET | Freq: Four times a day (QID) | ORAL | 1 refills | Status: AC | PRN

## 2021-10-06 MED ORDER — JEVITY 1.5 CAL/FIBER PO LIQD: 1000.0000 mL | ORAL | Status: AC

## 2021-10-06 MED ORDER — ALBUTEROL SULFATE (2.5 MG/3ML) 0.083% IN NEBU: 2.5000 mg | INHALATION_SOLUTION | RESPIRATORY_TRACT | 12 refills | Status: AC | PRN

## 2021-10-06 MED ORDER — ACETAMINOPHEN 325 MG PO TABS: 650.0000 mg | ORAL_TABLET | Freq: Four times a day (QID) | ORAL | 2 refills | Status: AC | PRN

## 2021-10-06 MED ORDER — POLYETHYLENE GLYCOL 3350 17 G PO PACK: 17.0000 g | PACK | Freq: Every day | ORAL | 2 refills | Status: AC

## 2021-10-06 MED ORDER — SERTRALINE HCL 50 MG PO TABS: 50.0000 mg | ORAL_TABLET | Freq: Every day | ORAL | 2 refills | Status: AC

## 2021-10-06 MED ORDER — MULTI-VITAMIN/MINERALS PO TABS: 1.0000 | ORAL_TABLET | Freq: Every day | ORAL | 0 refills | Status: AC

## 2021-10-06 MED ORDER — MIDODRINE HCL 2.5 MG PO TABS: 2.5000 mg | ORAL_TABLET | Freq: Three times a day (TID) | ORAL | Status: AC

## 2021-10-06 MED ORDER — ENOXAPARIN SODIUM 60 MG/0.6ML IJ SOSY: 60.0000 mg | PREFILLED_SYRINGE | INTRAMUSCULAR | 1 refills | Status: DC

## 2021-10-06 MED ORDER — BISACODYL 10 MG RE SUPP: 10.0000 mg | Freq: Every day | RECTAL | 0 refills | Status: AC | PRN

## 2021-10-06 MED ORDER — ENOXAPARIN SODIUM 60 MG/0.6ML IJ SOSY: 60.0000 mg | PREFILLED_SYRINGE | Freq: Two times a day (BID) | INTRAMUSCULAR | 1 refills | Status: DC

## 2021-10-06 MED ORDER — ATORVASTATIN CALCIUM 40 MG PO TABS: 40.0000 mg | ORAL_TABLET | Freq: Every day | ORAL | 3 refills | Status: AC

## 2021-10-06 MED ORDER — MAGNESIUM OXIDE -MG SUPPLEMENT 400 (240 MG) MG PO TABS: 400.0000 mg | ORAL_TABLET | Freq: Every day | ORAL | 0 refills | Status: AC

## 2021-10-06 MED ORDER — HYDROCODONE-ACETAMINOPHEN 5-325 MG PO TABS
1.0000 | ORAL_TABLET | ORAL | 0 refills | Status: AC | PRN
Start: 2021-10-06 — End: 2022-10-06

## 2021-10-06 MED ORDER — MIRTAZAPINE 15 MG PO TBDP: 15.0000 mg | ORAL_TABLET | Freq: Every day | ORAL | 2 refills | Status: AC

## 2021-10-06 MED ORDER — FOLIC ACID 1 MG PO TABS: 1.0000 mg | ORAL_TABLET | Freq: Every day | ORAL | 0 refills | Status: AC

## 2021-10-06 MED ORDER — AMIODARONE HCL 200 MG PO TABS: 200.0000 mg | ORAL_TABLET | Freq: Every day | ORAL | 1 refills | Status: AC

## 2021-10-06 NOTE — Progress Notes (Signed)
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  Of note, patient was recently admitted hospital for atrial fibrillation with RVR and a loculated pleural effusion with left lower extremity DVT.  On this admission, patient was on nonrebreather mask on arrival.  Patient was mildly tachycardic and was saturating 100% on nonrebreather mask at 10 L/min.  Patient was given duo nebs and was admitted hospital.  COVID and influenza was negative.  WBC showed a white count of 33.9.  Albumin was 1.7.  Portable chest x-ray showed complete hyperexpansion of the left hemithorax with rightward shift of the metastasis structures due to large pleural effusion.  Patient was then admitted hospital for further evaluation and treatment.  Pulmonary was urgently consulted.  Patient underwent tunneled Pleurx cath placement on 09/29/2021.  Assessment/Plan:  Principal Problem:   Pleural effusion on left Active Problems:   Generalized anxiety disorder   Hyperlipidemia   Type 2 diabetes mellitus (HCC)   Adenocarcinoma of left lung, stage 4 (HCC)   Pleural effusion   A-fib (HCC)   Protein-calorie malnutrition, severe   Pressure injury of skin   Leukocytosis   Anemia of chronic disease   Left leg DVT (HCC)   Lateral left-sided malignant pleural effusion.  History of stage IV lung cancer.  Seen by pulmonary and status post Pleurx  catheter placement.  Patient will be seen by home health on discharge for Pleurx catheter management.  Plan is to drain Pleurx catheter every other day with maximum of 1 L at 1 time.  Home health has set up will be needed on discharge.  Family to learn about feeding Pleurx catheter management including catheter management as well.  Cancer related pain.  on fentanyl patch and oxycodone.   Constipation.  Continue MiraLAX through the PEG tube.  Continue Dulcolax suppository  Hypomagnesemia.  Replenished.  Continue magnesium oxide.    Leukocytosis Likely secondary to recent Granix administration.  No obvious fever or chills.  Latest WBC at 31K.  We will check a CBC with differential in AM.  No obvious source of infection at this point.     Anemia of chronic disease   Secondary to malignancy and chemotherapy, we will continue to monitor.  Latest hemoglobin of 8.0.    Left leg DVT  Continue Lovenox therapeutic dose, 60 mg subcu twice daily.     Generalized anxiety disorder Continue mirtazapine and sertraline     Hyperlipidemia On levetiracetam.  Diabetes mellitus type 2.   On metformin at home.  We will continue with sliding scale insulin. Diabetic diet.  Latest POC glucose of 85.    Paroxysmal A-fib CHA2DS2-VASc score of 2.  Continue amiodarone, enoxaparin and metoprolol.  Heart rate controlled     Protein-calorie malnutrition, severe Continue tube feeding.  Will need tube feeding at home.     Pressure injury of skin Present on admission.  Continue local wound care.  Disposition: patient was at [facility] skilled nursing facility.  At this time patient is considering home with home health.  Patient's family is not prepared to go home yet due to lack of arrangements at home.  Requiring a hospital bed and nephew to be at home for assistance.  DVT prophylaxis:   Lovenox subcu therapeutic dose  Code Status: DNR  Family Communication:  I again spoke with the patient's significant  other bedside on 10/06/2021.  Status is: inpatient  The patient is inpatient because:  Chest tube placement, metastatic lung cancer, awaiting for home health arrangements and family preparation for discharge   Consultants: PCCM  Procedures: Left chest wall tunneled Pleurx catheter placement on 09/29/2021  Anti-infectives:  none  Anti-infectives (From admission, onward)    None      Subjective: Today, patient was seen and examined at bedside denies any nausea vomiting fever or chills.  Temperature max of 98.1 F.  Objective: Vitals:   10/06/21 0626 10/06/21 1415  BP: 122/63 116/65  Pulse: 94 94  Resp: 18 16  Temp: 97.7 F (36.5 C)   SpO2: 100% 97%    Intake/Output Summary (Last 24 hours) at 10/06/2021 1607 Last data filed at 10/06/2021 1537 Gross per 24 hour  Intake 120 ml  Output 900 ml  Net -780 ml    Filed Weights   10/03/21 0500 10/05/21 0500 10/06/21 0500  Weight: 57 kg 53.2 kg 53.2 kg   Body mass index is 19.52 kg/m.   Physical Exam:  General: Frail, thinly built, deconditioned  HENT:   Mild pallor and icterus noted.  Oral mucosa is moist.  Chest:    Diminished breath sounds bilaterally.  Left-sided Pleurx catheter in place. CVS: S1 &S2 heard. No murmur.  Regular rate and rhythm. Abdomen: Soft, nontender, nondistended.  Bowel sounds are heard.   Extremities: No cyanosis, clubbing or edema.  Peripheral pulses are palpable. Psych: Alert, awake and communicative, appears weak. CNS:  No cranial nerve deficits.  Generalized weakness noted. Skin: Warm and dry.  No rashes noted.  \Data Review: I have personally reviewed the following laboratory data and studies,  CBC: Recent Labs  Lab 10/01/21 0448 10/02/21 0525 10/04/21 0525 10/05/21 0809 10/06/21 0411  WBC 29.1* 30.6* 37.7* 35.8* 31.6*  HGB 7.5* 7.4* 7.6* 8.0* 8.0*  HCT 24.2* 23.5* 24.6* 26.2* 25.3*  MCV 95.3 94.4 96.1 98.1 94.8  PLT 606* 564* 489* 524* 556*    Basic Metabolic Panel: Recent  Labs  Lab 09/29/21 1648 09/30/21 0856 10/01/21 0448 10/02/21 0525 10/04/21 0525 10/05/21 0809 10/06/21 0411  NA  --    < > 135 133* 133* 131* 132*  K  --    < > 4.4 4.6 4.4 4.4 4.0  CL  --    < > 102 101 97* 94* 95*  CO2  --    < > 28 25 29 29 30   GLUCOSE  --    < > 124* 132* 79 113* 163*  BUN  --    < > 15 13 13 11 11   CREATININE  --    < > 0.35* 0.37* 0.31* 0.30* 0.30*  CALCIUM  --    < > 7.9* 8.0* 8.3* 8.4* 7.9*  MG 1.5*  --  1.5* 1.3* 1.3* 1.5*  --   PHOS 4.0  --  2.6  --   --  3.7  --    < > = values in this interval not displayed.    Liver Function Tests: Recent Labs  Lab 09/30/21 0856 10/01/21 0448  AST 50* 142*  ALT 34 100*  ALKPHOS 258* 272*  BILITOT 0.2* 0.3  PROT 5.4* 5.2*  ALBUMIN <1.5* 1.5*    No results for input(s): LIPASE, AMYLASE in the last 168 hours. No results for input(s): AMMONIA in the last 168 hours. Cardiac Enzymes: No results for input(s): CKTOTAL, CKMB, CKMBINDEX, TROPONINI in the last 168 hours. BNP (last 3 results) Recent Labs    09/29/21 1247  BNP 255.9*     ProBNP (last 3 results) No results for input(s): PROBNP in the last 8760 hours.  CBG: Recent Labs  Lab 10/05/21 1155 10/05/21 1738 10/06/21 0041 10/06/21 0623 10/06/21 1120  GLUCAP 103* 127* 163* 105* 85    Recent Results (from the past 240 hour(s))  Resp Panel by RT-PCR (Flu A&B, Covid) Nasopharyngeal Swab     Status: None   Collection Time: 09/28/21  3:26 PM   Specimen: Nasopharyngeal Swab; Nasopharyngeal(NP) swabs in vial transport medium  Result Value Ref Range Status   SARS Coronavirus 2 by RT PCR NEGATIVE NEGATIVE Final    Comment: (NOTE) SARS-CoV-2 target nucleic acids are NOT DETECTED.  The SARS-CoV-2 RNA is generally detectable in upper respiratory specimens during the acute phase of infection. The lowest concentration of SARS-CoV-2 viral copies this assay can detect is 138 copies/mL. A negative result does not preclude SARS-Cov-2 infection and should  not be used as the sole basis for treatment or other patient management decisions. A negative result may occur with  improper specimen collection/handling, submission of specimen other than nasopharyngeal swab, presence of viral mutation(s) within the areas targeted by this assay, and inadequate number of viral copies(<138 copies/mL). A negative result must be combined with clinical observations, patient history, and epidemiological information. The expected result is Negative.  Fact Sheet for Patients:  EntrepreneurPulse.com.au  Fact Sheet for Healthcare Providers:  IncredibleEmployment.be  This test is no t yet approved or cleared by the [place] FDA and  has been authorized for detection and/or diagnosis of SARS-CoV-2 by FDA under an Emergency Use Authorization (EUA). This EUA will remain  in effect (meaning this test can be used) for the duration of the COVID-19 declaration under Section 564(b)(1) of the Act, 21 U.S.C.section 360bbb-3(b)(1), unless the authorization is terminated  or revoked sooner.       Influenza A by PCR NEGATIVE NEGATIVE Final   Influenza B by PCR NEGATIVE NEGATIVE Final    Comment: (NOTE) The Xpert Xpress SARS-CoV-2/FLU/RSV plus assay is intended as an aid in the diagnosis of influenza from Nasopharyngeal swab specimens and should not be used as a sole basis for treatment. Nasal washings and aspirates are unacceptable for Xpert Xpress SARS-CoV-2/FLU/RSV testing.  Fact Sheet for Patients: EntrepreneurPulse.com.au  Fact Sheet for Healthcare Providers: IncredibleEmployment.be  This test is not yet approved or cleared by the [place] FDA and has been authorized for detection and/or diagnosis of SARS-CoV-2 by FDA under an Emergency Use Authorization (EUA). This EUA will remain in effect (meaning this test can be used) for the duration of the COVID-19 declaration under  Section 564(b)(1) of the Act, 21 U.S.C. section 360bbb-3(b)(1), unless the authorization is terminated or revoked.  Performed at [facility], [place] [address], [place], [place] [address]   Resp Panel by RT-PCR (Flu A&B, Covid) Nasopharyngeal Swab     Status: None   Collection Time: 09/29/21 12:01 PM   Specimen: Nasopharyngeal Swab; Nasopharyngeal(NP) swabs in vial transport medium  Result Value Ref Range Status   SARS Coronavirus 2 by RT PCR NEGATIVE NEGATIVE Final    Comment: (NOTE) SARS-CoV-2 target  nucleic acids are NOT DETECTED.  The SARS-CoV-2 RNA is generally detectable in upper respiratory specimens during the acute phase of infection. The lowest concentration of SARS-CoV-2 viral copies this assay can detect is 138 copies/mL. A negative result does not preclude SARS-Cov-2 infection and should not be used as the sole basis for treatment or other patient management decisions. A negative result may occur with  improper specimen collection/handling, submission of specimen other than nasopharyngeal swab, presence of viral mutation(s) within the areas targeted by this assay, and inadequate number of viral copies(<138 copies/mL). A negative result must be combined with clinical observations, patient history, and epidemiological information. The expected result is Negative.  Fact Sheet for Patients:  EntrepreneurPulse.com.au  Fact Sheet for Healthcare Providers:  IncredibleEmployment.be  This test is no t yet approved or cleared by the [place] FDA and  has been authorized for detection and/or diagnosis of SARS-CoV-2 by FDA under an Emergency Use Authorization (EUA). This EUA will remain  in effect (meaning this test can be used) for the duration of the COVID-19 declaration under Section 564(b)(1) of the Act, 21 U.S.C.section 360bbb-3(b)(1), unless the authorization is terminated  or revoked sooner.       Influenza A  by PCR NEGATIVE NEGATIVE Final   Influenza B by PCR NEGATIVE NEGATIVE Final    Comment: (NOTE) The Xpert Xpress SARS-CoV-2/FLU/RSV plus assay is intended as an aid in the diagnosis of influenza from Nasopharyngeal swab specimens and should not be used as a sole basis for treatment. Nasal washings and aspirates are unacceptable for Xpert Xpress SARS-CoV-2/FLU/RSV testing.  Fact Sheet for Patients: EntrepreneurPulse.com.au  Fact Sheet for Healthcare Providers: IncredibleEmployment.be  This test is not yet approved or cleared by the [place] FDA and has been authorized for detection and/or diagnosis of SARS-CoV-2 by FDA under an Emergency Use Authorization (EUA). This EUA will remain in effect (meaning this test can be used) for the duration of the COVID-19 declaration under Section 564(b)(1) of the Act, 21 U.S.C. section 360bbb-3(b)(1), unless the authorization is terminated or revoked.  Performed at [facility], [place] [address], [place], [place] [address]      Studies: No results found.   Flora Lipps, MD  [facility] 10/06/2021  If 7PM-7AM, please contact night-coverage

## 2021-10-06 NOTE — TOC Progression Note (Signed)
Transition of Care [facility]) - Progression Note    Patient Details  Name: Kim Ross MRN: [id number] Date of Birth: Jul 26, 1956  Transition of Care [facility]) CM/SW Contact  Leeroy Cha, RN Phone Number: 10/06/2021, 1:33 PM  Clinical Narrative:    Spoke with patient and brother/ house will not be ready for the hospital bed until tomorrow and the nephew that is to take care of her will be Thursday.  Message sent to floor rn and md.   Expected Discharge Plan: [place] Barriers to Discharge: Continued Medical Work up  Expected Discharge Plan and Services Expected Discharge Plan: [place]   Discharge Planning Services: CM Consult     Expected Discharge Date: 10/06/21                         HH Arranged: RN, PT (PEG-TF) HH Agency: [facility], [place] Date [facility] Agency Contacted: 10/02/21 Time HH Agency Contacted: 33 Representative spoke with at [place]: Cory/Pam chandler   Social Determinants of Health ([place]) Interventions    Readmission Risk Interventions Readmission Risk Prevention Plan 09/30/2021 09/18/2021  Transportation Screening Complete Complete  Medication Review [employer]) - Complete  PCP or Specialist appointment within 3-5 days of discharge Complete Complete  HRI or [place] - Complete  SW Recovery Care/Counseling Consult Complete -  Palliative Care Screening Complete Complete  Skilled Nursing Facility Complete Complete  Some recent data might be hidden

## 2021-10-06 NOTE — Plan of Care (Signed)
°  Problem: Health Behavior/Discharge Planning: Goal: Ability to manage health-related needs will improve Outcome: Progressing   Problem: Clinical Measurements: Goal: Ability to maintain clinical measurements within normal limits will improve Outcome: Progressing Goal: Will remain free from infection Outcome: Progressing Goal: Diagnostic test results will improve Outcome: Progressing Goal: Respiratory complications will improve Outcome: Progressing   Problem: Coping: Goal: Level of anxiety will decrease Outcome: Progressing

## 2021-10-07 ENCOUNTER — Inpatient Hospital Stay: Payer: 59

## 2021-10-07 ENCOUNTER — Inpatient Hospital Stay: Payer: 59 | Attending: Physician Assistant

## 2021-10-07 ENCOUNTER — Ambulatory Visit: Payer: No Typology Code available for payment source | Admitting: Dermatology

## 2021-10-07 ENCOUNTER — Inpatient Hospital Stay: Payer: 59 | Admitting: Physician Assistant

## 2021-10-07 DIAGNOSIS — I480 Paroxysmal atrial fibrillation: Secondary | ICD-10-CM | POA: Diagnosis not present

## 2021-10-07 DIAGNOSIS — J90 Pleural effusion, not elsewhere classified: Secondary | ICD-10-CM | POA: Diagnosis not present

## 2021-10-07 DIAGNOSIS — D638 Anemia in other chronic diseases classified elsewhere: Secondary | ICD-10-CM | POA: Diagnosis not present

## 2021-10-07 DIAGNOSIS — C3492 Malignant neoplasm of unspecified part of left bronchus or lung: Secondary | ICD-10-CM | POA: Diagnosis not present

## 2021-10-07 LAB — CBC WITH DIFFERENTIAL/PLATELET
Abs Immature Granulocytes: 0.71 10*3/uL — ABNORMAL HIGH (ref 0.00–0.07)
Basophils Absolute: 0.1 10*3/uL (ref 0.0–0.1)
Basophils Relative: 0 %
Eosinophils Absolute: 0.2 10*3/uL (ref 0.0–0.5)
Eosinophils Relative: 1 %
HCT: 24.9 % — ABNORMAL LOW (ref 36.0–46.0)
Hemoglobin: 7.7 g/dL — ABNORMAL LOW (ref 12.0–15.0)
Immature Granulocytes: 2 %
Lymphocytes Relative: 4 %
Lymphs Abs: 1.1 10*3/uL (ref 0.7–4.0)
MCH: 30.1 pg (ref 26.0–34.0)
MCHC: 30.9 g/dL (ref 30.0–36.0)
MCV: 97.3 fL (ref 80.0–100.0)
Monocytes Absolute: 3.2 10*3/uL — ABNORMAL HIGH (ref 0.1–1.0)
Monocytes Relative: 11 %
Neutro Abs: 24.5 10*3/uL — ABNORMAL HIGH (ref 1.7–7.7)
Neutrophils Relative %: 82 %
Platelets: 554 10*3/uL — ABNORMAL HIGH (ref 150–400)
RBC: 2.56 MIL/uL — ABNORMAL LOW (ref 3.87–5.11)
RDW: 22.1 % — ABNORMAL HIGH (ref 11.5–15.5)
WBC: 29.8 10*3/uL — ABNORMAL HIGH (ref 4.0–10.5)
nRBC: 0.3 % — ABNORMAL HIGH (ref 0.0–0.2)

## 2021-10-07 LAB — MAGNESIUM: Magnesium: 1.4 mg/dL — ABNORMAL LOW (ref 1.7–2.4)

## 2021-10-07 LAB — GLUCOSE, CAPILLARY
Glucose-Capillary: 159 mg/dL — ABNORMAL HIGH (ref 70–99)
Glucose-Capillary: 148 mg/dL — ABNORMAL HIGH (ref 70–99)
Glucose-Capillary: 122 mg/dL — ABNORMAL HIGH (ref 70–99)
Glucose-Capillary: 138 mg/dL — ABNORMAL HIGH (ref 70–99)

## 2021-10-07 LAB — BASIC METABOLIC PANEL
Anion gap: 13 (ref 5–15)
BUN: 12 mg/dL (ref 8–23)
CO2: 26 mmol/L (ref 22–32)
Calcium: 8.4 mg/dL — ABNORMAL LOW (ref 8.9–10.3)
Chloride: 94 mmol/L — ABNORMAL LOW (ref 98–111)
Creatinine, Ser: 0.33 mg/dL — ABNORMAL LOW (ref 0.44–1.00)
GFR, Estimated: >60 mL/min (ref >60)
Glucose, Bld: 154 mg/dL — ABNORMAL HIGH (ref 70–99)
Potassium: 4.2 mmol/L (ref 3.5–5.1)
Sodium: 133 mmol/L — ABNORMAL LOW (ref 135–145)

## 2021-10-07 MED ORDER — MAGNESIUM SULFATE 2 GM/50ML IV SOLN
2.0000 g | Freq: Once | INTRAVENOUS | Status: AC
Start: 2021-10-07 — End: 2021-10-07
  Administered 2021-10-07: 2 g via INTRAVENOUS
  Filled 2021-10-07: qty 50

## 2021-10-07 NOTE — TOC Progression Note (Signed)
Transition of Care [facility]) - Progression Note    Patient Details  Name: NIKAELA COYNE MRN: [id number] Date of Birth: 03-12-1956  Transition of Care [facility]) CM/SW Contact  Leeroy Cha, RN Phone Number: 10/07/2021, 8:40 AM  Clinical Narrative:    Hospital bed for delivery for today sent to [employer] dme supplier   Expected Discharge Plan: [place] Barriers to Discharge: Continued Medical Work up  Expected Discharge Plan and Services Expected Discharge Plan: [place]   Discharge Planning Services: CM Consult     Expected Discharge Date: 10/06/21               DME Arranged: Hospital bed DME Agency: [employer] Date DME Agency Contacted: 10/07/21 Time DME Agency Contacted: 1 Representative spoke with at DME Agency: Brenton Grills HH Arranged: RN, PT (PEG-TF) [place] Agency: [facility], [place] Date [place]: 10/02/21 Time HH Agency Contacted: 6 Representative spoke with at [place]: Cory/Pam chandler   Social Determinants of Health ([place]) Interventions    Readmission Risk Interventions Readmission Risk Prevention Plan 09/30/2021 09/18/2021  Transportation Screening Complete Complete  Medication Review Press photographer) - Complete  PCP or Specialist appointment within 3-5 days of discharge Complete Complete  HRI or [place] - Complete  SW Recovery Care/Counseling Consult Complete -  Palliative Care Screening Complete Complete  Skilled Nursing Facility Complete Complete  Some recent data might be hidden

## 2021-10-07 NOTE — Progress Notes (Addendum)
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  Of note, patient was recently admitted hospital for atrial fibrillation with RVR and a loculated pleural effusion with left lower extremity DVT.  On this admission, patient was on nonrebreather mask on arrival.  Patient was mildly tachycardic and was saturating 100% on nonrebreather mask at 10 L/min.  Patient was given duo nebs and was admitted hospital.  COVID and influenza was negative.  WBC showed a white count of 33.9.  Albumin was 1.7.  Portable chest x-ray showed complete hyperexpansion of the left hemithorax with rightward shift of the metastasis structures due to large pleural effusion.  Patient was then admitted hospital for further evaluation and treatment.  Pulmonary was urgently consulted.  Patient underwent tunneled Pleurx cath placement on 09/29/2021.  Patient has remained stable after Pleurx catheter placement.  At this time, awaiting for home health/family arrangements to return home.  TOC aware of this.  Assessment/Plan:  Principal Problem:   Pleural effusion on left Active Problems:   Generalized anxiety disorder   Hyperlipidemia   Type 2 diabetes mellitus (HCC)   Adenocarcinoma of left lung, stage 4 (HCC)   Pleural effusion   A-fib (HCC)   Protein-calorie malnutrition, severe   Pressure injury of skin   Leukocytosis   Anemia of chronic  disease   Left leg DVT (HCC)   Lateral left-sided malignant pleural effusion.  History of stage IV lung cancer-adenosquamous carcinoma.  Diagnosed September 2022.  Patient had received palliative chemotherapy as outpatient status post 3 cycles, last cycle given 08/25/2021.  She will have to follow-up with oncology as outpatient for further plans but I believe palliative care enroute would benefit the patient most. seen by pulmonary and status post Pleurx catheter placement.  Patient will be seen by home health on discharge for Pleurx catheter management.  Plan is to drain Pleurx catheter every other day with maximum of 1 L at 1 time.  Home health has been set up.  Family to learn about  Pleurx catheter management including foley catheter and PEG tube.  Awaiting on nephew to come to town who will be taking care of the patient.  Patient was last seen by oncology during previous hospitalization on 09/11/2021.  Cancer related pain.  on fentanyl patch and oxycodone.  Chronic medication.  Will be continued on discharge.  Constipation.  Continue MiraLAX through the PEG tube.  Continue Dulcolax suppository  Indwelling Foley catheter.  History of spinal trauma and bladder outlet obstruction.  Chronic.  Continue on discharge, will consider Foley catheter exchanged today.  Hypomagnesemia.  We will give IV magnesium sulfate 2 g today.  Continue magnesium oxide.    Leukocytosis  No obvious fever or chills.  Latest WBC at 29K.  Neutrophil at 82%..  No obvious source of infection at this point necessitating antibiotic.  Will likely need outpatient follow-up to monitor this.  Chronic indwelling catheter but is asymptomatic at this time.    Anemia of chronic disease   Secondary to malignancy and chemotherapy,  we will continue to monitor.  Latest hemoglobin of 7.7    Left leg DVT  Continue Lovenox therapeutic dose, 60 mg subcu twice daily.  Continue long-term due to underlying cancer.     Generalized anxiety  disorder Continue mirtazapine and sertraline   Diabetes mellitus type 2.   On metformin at home. continue with sliding scale insulin.  Latest POC glucose of 159.    Paroxysmal A-fib CHA2DS2-VASc score of 2.  Continue amiodarone, enoxaparin and metoprolol.  Heart rate controlled     Protein-calorie malnutrition, severe Continue tube feeding.  Will need tube feeding at home.  Did not tolerate bolus tube feeding last time so we will need continuous tube feeding on discharge.     Pressure injury of skin Present on admission.  Continue local wound care.  Ethics/goals of care.  Patient is very frail ,deconditioned with  stage IV lung cancer.  Overall prognosis of the patient is poor.  At this time, patient and family wish to go home.  Palliative care referral as outpatient has been desired patient's daughter..  High chances of recurrent admission to the hospital so ongoing goals of care in the long-term would benefit the patient  Disposition: patient was at [facility] skilled nursing facility and had a bounce back for dyspnea and pleural effusion.  At this time, patient is considering home with home health.  Patient's family is not prepared to go home yet due to lack of arrangements at home and waiting for nephew to come in town.  I have ordered hospital bed.  Plan to discharge home on 10/08/21 if arrangement done and education done.  Patient would benefit from palliative care follow-up as outpatient, patient's daughter agrees to it..  Communicated with TOC regarding outpatient palliative care referral.  DVT prophylaxis:   Lovenox subcu therapeutic dose  Code Status: DNR  Family Communication:  I spoke with the patient's daughter at bedside on 10/07/2021.  Status is: inpatient  The patient is inpatient because:  Chest tube placement, metastatic lung cancer, awaiting for home health arrangements and family preparation for discharge   Consultants: PCCM  Procedures: Left chest wall tunneled  Pleurx catheter placement on 09/29/2021  Anti-infectives:  none  Anti-infectives (From admission, onward)    None      Subjective: Today, patient was seen and examined at bedside.  Patient denies any nausea vomiting abdominal pain fever chills  Objective: Vitals:   10/06/21 2000 10/07/21 0701  BP: (!) 111/55 116/62  Pulse: 95 97  Resp: 16 18  Temp: 97.8 F (36.6 C) 97.8 F (36.6 C)  SpO2: 100% 100%    Intake/Output Summary (Last 24 hours) at 10/07/2021 0812 Last data filed at 10/07/2021 0700 Gross per 24 hour  Intake 60 ml  Output 975 ml  Net -915 ml    Filed Weights   10/05/21 0500 10/06/21 0500 10/07/21 0500  Weight: 53.2 kg 53.2 kg 53 kg   Body mass index is 19.44 kg/m.   Physical Exam:  General: Alert awake, frail, thinly built, deconditioned. HENT: Mild pallor noted.  Oral mucosa is moist.  Chest:    Diminished breath sounds bilaterally.  Left-sided Pleurx catheter in place.   CVS: S1 &S2 heard. No murmur.  Regular rate and rhythm. Abdomen: Soft, nontender, nondistended.  Bowel sounds are heard.  PEG tube in place.  Foley catheter in place with turbidity. Extremities: No cyanosis, clubbing or edema.  Peripheral pulses are palpable. Psych: Alert, awake and oriented, normal mood, CNS:  No cranial  nerve deficits.  Generalized weakness noted. Skin: Warm and dry.  No rashes noted.  \Data Review: I have personally reviewed the following laboratory data and studies,  CBC: Recent Labs  Lab 10/02/21 0525 10/04/21 0525 10/05/21 0809 10/06/21 0411 10/07/21 0442  WBC 30.6* 37.7* 35.8* 31.6* 29.8*  NEUTROABS  --   --   --   --  24.5*  HGB 7.4* 7.6* 8.0* 8.0* 7.7*  HCT 23.5* 24.6* 26.2* 25.3* 24.9*  MCV 94.4 96.1 98.1 94.8 97.3  PLT 564* 489* 524* 556* 554*    Basic Metabolic Panel: Recent Labs  Lab 10/01/21 0448 10/02/21 0525 10/04/21 0525 10/05/21 0809 10/06/21 0411 10/07/21 0442  NA 135 133* 133* 131* 132* 133*  K 4.4 4.6 4.4 4.4 4.0 4.2  CL  102 101 97* 94* 95* 94*  CO2 28 25 29 29 30 26   GLUCOSE 124* 132* 79 113* 163* 154*  BUN 15 13 13 11 11 12   CREATININE 0.35* 0.37* 0.31* 0.30* 0.30* 0.33*  CALCIUM 7.9* 8.0* 8.3* 8.4* 7.9* 8.4*  MG 1.5* 1.3* 1.3* 1.5*  --  1.4*  PHOS 2.6  --   --  3.7  --   --     Liver Function Tests: Recent Labs  Lab 09/30/21 0856 10/01/21 0448  AST 50* 142*  ALT 34 100*  ALKPHOS 258* 272*  BILITOT 0.2* 0.3  PROT 5.4* 5.2*  ALBUMIN <1.5* 1.5*    No results for input(s): LIPASE, AMYLASE in the last 168 hours. No results for input(s): AMMONIA in the last 168 hours. Cardiac Enzymes: No results for input(s): CKTOTAL, CKMB, CKMBINDEX, TROPONINI in the last 168 hours. BNP (last 3 results) Recent Labs    09/29/21 1247  BNP 255.9*     ProBNP (last 3 results) No results for input(s): PROBNP in the last 8760 hours.  CBG: Recent Labs  Lab 10/06/21 0623 10/06/21 1120 10/06/21 1803 10/06/21 2350 10/07/21 0640  GLUCAP 105* 85 78 119* 159*    Recent Results (from the past 240 hour(s))  Resp Panel by RT-PCR (Flu A&B, Covid) Nasopharyngeal Swab     Status: None   Collection Time: 09/28/21  3:26 PM   Specimen: Nasopharyngeal Swab; Nasopharyngeal(NP) swabs in vial transport medium  Result Value Ref Range Status   SARS Coronavirus 2 by RT PCR NEGATIVE NEGATIVE Final    Comment: (NOTE) SARS-CoV-2 target nucleic acids are NOT DETECTED.  The SARS-CoV-2 RNA is generally detectable in upper respiratory specimens during the acute phase of infection. The lowest concentration of SARS-CoV-2 viral copies this assay can detect is 138 copies/mL. A negative result does not preclude SARS-Cov-2 infection and should not be used as the sole basis for treatment or other patient management decisions. A negative result may occur with  improper specimen collection/handling, submission of specimen other than nasopharyngeal swab, presence of viral mutation(s) within the areas targeted by this assay, and  inadequate number of viral copies(<138 copies/mL). A negative result must be combined with clinical observations, patient history, and epidemiological information. The expected result is Negative.  Fact Sheet for Patients:  EntrepreneurPulse.com.au  Fact Sheet for Healthcare Providers:  IncredibleEmployment.be  This test is no t yet approved or cleared by the [place] FDA and  has been authorized for detection and/or diagnosis of SARS-CoV-2 by FDA under an Emergency Use Authorization (EUA). This EUA will remain  in effect (meaning this test can be used) for the duration of the COVID-19 declaration under Section 564(b)(1) of the Act, 21 U.S.C.section 360bbb-3(b)(1),  unless the authorization is terminated  or revoked sooner.       Influenza A by PCR NEGATIVE NEGATIVE Final   Influenza B by PCR NEGATIVE NEGATIVE Final    Comment: (NOTE) The Xpert Xpress SARS-CoV-2/FLU/RSV plus assay is intended as an aid in the diagnosis of influenza from Nasopharyngeal swab specimens and should not be used as a sole basis for treatment. Nasal washings and aspirates are unacceptable for Xpert Xpress SARS-CoV-2/FLU/RSV testing.  Fact Sheet for Patients: EntrepreneurPulse.com.au  Fact Sheet for Healthcare Providers: IncredibleEmployment.be  This test is not yet approved or cleared by the [place] FDA and has been authorized for detection and/or diagnosis of SARS-CoV-2 by FDA under an Emergency Use Authorization (EUA). This EUA will remain in effect (meaning this test can be used) for the duration of the COVID-19 declaration under Section 564(b)(1) of the Act, 21 U.S.C. section 360bbb-3(b)(1), unless the authorization is terminated or revoked.  Performed at [facility], [place] [address], [place], [place] [address]   Resp Panel by RT-PCR (Flu A&B, Covid) Nasopharyngeal Swab     Status: None    Collection Time: 09/29/21 12:01 PM   Specimen: Nasopharyngeal Swab; Nasopharyngeal(NP) swabs in vial transport medium  Result Value Ref Range Status   SARS Coronavirus 2 by RT PCR NEGATIVE NEGATIVE Final    Comment: (NOTE) SARS-CoV-2 target nucleic acids are NOT DETECTED.  The SARS-CoV-2 RNA is generally detectable in upper respiratory specimens during the acute phase of infection. The lowest concentration of SARS-CoV-2 viral copies this assay can detect is 138 copies/mL. A negative result does not preclude SARS-Cov-2 infection and should not be used as the sole basis for treatment or other patient management decisions. A negative result may occur with  improper specimen collection/handling, submission of specimen other than nasopharyngeal swab, presence of viral mutation(s) within the areas targeted by this assay, and inadequate number of viral copies(<138 copies/mL). A negative result must be combined with clinical observations, patient history, and epidemiological information. The expected result is Negative.  Fact Sheet for Patients:  EntrepreneurPulse.com.au  Fact Sheet for Healthcare Providers:  IncredibleEmployment.be  This test is no t yet approved or cleared by the [place] FDA and  has been authorized for detection and/or diagnosis of SARS-CoV-2 by FDA under an Emergency Use Authorization (EUA). This EUA will remain  in effect (meaning this test can be used) for the duration of the COVID-19 declaration under Section 564(b)(1) of the Act, 21 U.S.C.section 360bbb-3(b)(1), unless the authorization is terminated  or revoked sooner.       Influenza A by PCR NEGATIVE NEGATIVE Final   Influenza B by PCR NEGATIVE NEGATIVE Final    Comment: (NOTE) The Xpert Xpress SARS-CoV-2/FLU/RSV plus assay is intended as an aid in the diagnosis of influenza from Nasopharyngeal swab specimens and should not be used as a sole basis for treatment.  Nasal washings and aspirates are unacceptable for Xpert Xpress SARS-CoV-2/FLU/RSV testing.  Fact Sheet for Patients: EntrepreneurPulse.com.au  Fact Sheet for Healthcare Providers: IncredibleEmployment.be  This test is not yet approved or cleared by the [place] FDA and has been authorized for detection and/or diagnosis of SARS-CoV-2 by FDA under an Emergency Use Authorization (EUA). This EUA will remain in effect (meaning this test can be used) for the duration of the COVID-19 declaration under Section 564(b)(1) of the Act, 21 U.S.C. section 360bbb-3(b)(1), unless the authorization is terminated or revoked.  Performed at [facility], [place] [address] [place], [place] [address]  Studies: No results found.   Flora Lipps, MD  [facility] 10/07/2021  If 7PM-7AM, please contact night-coverage

## 2021-10-08 DIAGNOSIS — J910 Malignant pleural effusion: Secondary | ICD-10-CM | POA: Diagnosis not present

## 2021-10-08 DIAGNOSIS — C3492 Malignant neoplasm of unspecified part of left bronchus or lung: Secondary | ICD-10-CM | POA: Diagnosis not present

## 2021-10-08 DIAGNOSIS — G893 Neoplasm related pain (acute) (chronic): Secondary | ICD-10-CM

## 2021-10-08 DIAGNOSIS — I480 Paroxysmal atrial fibrillation: Secondary | ICD-10-CM | POA: Diagnosis not present

## 2021-10-08 DIAGNOSIS — Z978 Presence of other specified devices: Secondary | ICD-10-CM

## 2021-10-08 LAB — CBC WITH DIFFERENTIAL/PLATELET
Abs Immature Granulocytes: 0.52 10*3/uL — ABNORMAL HIGH (ref 0.00–0.07)
Basophils Absolute: 0.1 10*3/uL (ref 0.0–0.1)
Basophils Relative: 0 %
Eosinophils Absolute: 0.3 10*3/uL (ref 0.0–0.5)
Eosinophils Relative: 1 %
HCT: 24.2 % — ABNORMAL LOW (ref 36.0–46.0)
Hemoglobin: 7.4 g/dL — ABNORMAL LOW (ref 12.0–15.0)
Immature Granulocytes: 2 %
Lymphocytes Relative: 4 %
Lymphs Abs: 1.1 10*3/uL (ref 0.7–4.0)
MCH: 29.7 pg (ref 26.0–34.0)
MCHC: 30.6 g/dL (ref 30.0–36.0)
MCV: 97.2 fL (ref 80.0–100.0)
Monocytes Absolute: 3.6 10*3/uL — ABNORMAL HIGH (ref 0.1–1.0)
Monocytes Relative: 13 %
Neutro Abs: 22.8 10*3/uL — ABNORMAL HIGH (ref 1.7–7.7)
Neutrophils Relative %: 80 %
Platelets: 493 10*3/uL — ABNORMAL HIGH (ref 150–400)
RBC: 2.49 MIL/uL — ABNORMAL LOW (ref 3.87–5.11)
RDW: 22.3 % — ABNORMAL HIGH (ref 11.5–15.5)
WBC: 28.4 10*3/uL — ABNORMAL HIGH (ref 4.0–10.5)
nRBC: 0.1 % (ref 0.0–0.2)

## 2021-10-08 LAB — BASIC METABOLIC PANEL
Anion gap: 8 (ref 5–15)
BUN: 12 mg/dL (ref 8–23)
CO2: 31 mmol/L (ref 22–32)
Calcium: 8.4 mg/dL — ABNORMAL LOW (ref 8.9–10.3)
Chloride: 95 mmol/L — ABNORMAL LOW (ref 98–111)
Creatinine, Ser: 0.36 mg/dL — ABNORMAL LOW (ref 0.44–1.00)
GFR, Estimated: >60 mL/min (ref >60)
Glucose, Bld: 141 mg/dL — ABNORMAL HIGH (ref 70–99)
Potassium: 4.1 mmol/L (ref 3.5–5.1)
Sodium: 134 mmol/L — ABNORMAL LOW (ref 135–145)

## 2021-10-08 LAB — GLUCOSE, CAPILLARY
Glucose-Capillary: 159 mg/dL — ABNORMAL HIGH (ref 70–99)
Glucose-Capillary: 149 mg/dL — ABNORMAL HIGH (ref 70–99)
Glucose-Capillary: 134 mg/dL — ABNORMAL HIGH (ref 70–99)

## 2021-10-08 LAB — MAGNESIUM: Magnesium: 1.7 mg/dL (ref 1.7–2.4)

## 2021-10-08 MED ORDER — SODIUM BICARBONATE 650 MG PO TABS: 650.0000 mg | ORAL_TABLET | Freq: Once | ORAL | Status: DC

## 2021-10-08 MED ORDER — PANCRELIPASE (LIP-PROT-AMYL) 10440-39150 UNITS PO TABS
20880.0000 [IU] | ORAL_TABLET | Freq: Once | ORAL | Status: DC
  Filled 2021-10-08: qty 2

## 2021-10-08 NOTE — Assessment & Plan Note (Signed)
--   Stable.  Continue fentanyl patch, oxycodone.

## 2021-10-08 NOTE — Progress Notes (Addendum)
Progress Note   Patient: Kim Ross GEX:[id number] DOB: 30-Nov-1955 DOA: 09/29/2021     8 DOS: the patient was seen and examined on 10/08/2021   Brief hospital course: 66 year old woman known stage IV NSCLC with known left pleural involvement, metastatic pleural effusion, presented 12/27 with more shortness of breath and found to have large left recurrent malignant pleural effusion with tension component, seen by pulmonary medicine in the emergency department and improved after placement of Pleurx catheter.  Patient declined to return to SNF and plan is to discharge home with home health.  Today she is on oxygen, this would be a new for her home environment, therefore we will need to test to see if she qualifies and arrange if needed.  She will be going home on Lovenox because of previous DVT she will need training on this.  We will plan on discharge 1/6 if able to coordinate.  Hospitalized 34 days with discharge 12/26 after being sent to the emergency department from oncology clinic for rapid heart rate, found to have atrial fibrillation with RVR, CT chest during that hospitalization showed large left-sided loculated pleural effusion, treated with amiodarone and metoprolol to control heart rate and therefore because of low blood pressure midodrine was used.  Patient underwent PEG tube placement for dysphagia and odynophagia, underwent left thoracentesis and completed antibiotics, found to have acute DVT started on Lovenox, discharged to [facility] rehab.  Assessment and Plan * Malignant pleural effusion -- Secondary to lung cancer, recurrent in nature, therefore Pleurx catheter placed by pulmonary medicine.  Patient will go home with this, plan is to drain Pleurx every other day up to 1 L maximum.  Home health to assist as well as with Foley catheter care and PEG tube. -- Clinically appears stable, on 1 L oxygen, will need to see whether she requires oxygen on discharge. -- Patient feels  well.  Adenocarcinoma of left lung, stage 4 ([place])- (present on admission) -- Dr. Earlie Server notified of admission.  Follow-up as an outpatient.  Left leg DVT ([place])- (present on admission) -- Continue therapeutic Lovenox given underlying cancer.  She will need Lovenox teaching, benefit check and prescription on discharge.  Anemia of chronic disease- (present on admission) -- Stable.  Leukocytosis- (present on admission) --Significance unclear, has been present since middle of December.  No signs or symptoms of infection.  Possibly related to malignancy.  Recommend close outpatient follow-up.  Protein-calorie malnutrition, severe- (present on admission) -- Continue tube feeds. --Jevity 1.5 at 60 ml/hr via G-tube -Provides 2160 kcals, 91g protein and 1094 ml H2O -Free water recommendation 150 ml 5x daily (750 ml) -Liquid MVI daily via tube  A-fib (HCC)- (present on admission) --Continue amiodarone, enoxaparin, metoprolol.  Type 2 diabetes mellitus (HCC) -- Stable here, resume metformin on discharge.  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  Continue on discharge.  Cancer related pain- (present on admission) -- Stable.  Continue fentanyl patch, oxycodone.     Subjective:  Feels ok today, very poor appetite but drinking some. Not on oxygen previously, none at home. Does not know how to give self Lovenox injection  Objective Vital signs were reviewed and unremarkable. Physical Exam Constitutional:      General: She is not in acute distress.    Appearance: She is not ill-appearing or toxic-appearing.  Cardiovascular:     Rate and Rhythm: Normal rate and regular rhythm.     Heart sounds: No murmur heard.  Comments: Telemetry SR Pulmonary:     Effort: Pulmonary effort is normal. No respiratory distress.     Breath sounds: No wheezing, rhonchi or rales.  Musculoskeletal:     Right lower leg: No edema.      Left lower leg: No edema.  Neurological:     Mental Status: She is alert.  Psychiatric:        Mood and Affect: Mood normal.        Behavior: Behavior normal.     Data Reviewed:    CBG stable, BMP unremarkable, WBC slowly trending down, Hgb stable at 7.4, thrombocytosis trending 493  Family Communication: none  Disposition: Status is: Inpatient  Remains inpatient appropriate because: currently on oxygen (new), further coordination for discharge         Time spent: 65 minutes  Author: Murray Hodgkins, MD 10/08/2021 7:11 PM  For on call review [email or web address].

## 2021-10-08 NOTE — Assessment & Plan Note (Signed)
--   History of spinal trauma bladder outlet obstruction.  Chronic in nature.  Continue on discharge.

## 2021-10-08 NOTE — Assessment & Plan Note (Signed)
--  Continue amiodarone, enoxaparin, metoprolol.

## 2021-10-08 NOTE — Assessment & Plan Note (Addendum)
--   Secondary to lung cancer, recurrent in nature, therefore Pleurx catheter placed by pulmonary medicine.  Plan is to drain Pleurx as need for dyspnea. Output is low.  Continue Foley catheter care and PEG tube. -- respiratory status stable for discharge

## 2021-10-08 NOTE — Assessment & Plan Note (Signed)
--   Stable here, resume metformin on discharge.

## 2021-10-08 NOTE — Assessment & Plan Note (Addendum)
--   tolerating tube feeds. --Jevity 1.5 at 60 ml/hr via G-tube -Provides 2160 kcals, 91g protein and 1094 ml H2O -Free water recommendation 150 ml 5x daily (750 ml) -Liquid MVI daily via tube

## 2021-10-08 NOTE — Hospital Course (Addendum)
66 year old woman known stage IV NSCLC with known left pleural involvement, metastatic pleural effusion, presented 12/27 with more shortness of breath and found to have large left recurrent malignant pleural effusion with tension component, seen by pulmonary medicine in the emergency department and improved after placement of Pleurx catheter.  Patient declined to return to SNF and plan was to discharge home with home health.  2 nephews flew down from [place] to provide care, case was discussed with daughter at bedside 1/6, reviewed complex care needs and daughter felt patient can go home with robust support.  1/7 family felt caring for her at home would not be possible, after further discussion patient elected SNF.  At this point she has some mild nausea, will check x-ray, but appears stable for transfer to SNF.

## 2021-10-08 NOTE — Assessment & Plan Note (Addendum)
--  Significance unclear, has been present since middle of December.  No signs or symptoms of infection.  Thought secondary to inflammation, discussed with Dr. Earlie Server, he will follow-up as an outpatient.

## 2021-10-08 NOTE — Assessment & Plan Note (Addendum)
--   Continue therapeutic Lovenox given underlying cancer.

## 2021-10-08 NOTE — Progress Notes (Signed)
Spouse concern about pt talking 2 scheduled BP medications to lower BP and midodrine to keep BP elevated. Will pass on to day shift for MD to address.

## 2021-10-08 NOTE — Assessment & Plan Note (Signed)
Stable

## 2021-10-08 NOTE — Assessment & Plan Note (Signed)
--   Dr. Earlie Server notified of admission.  Follow-up as an outpatient.

## 2021-10-08 NOTE — TOC Progression Note (Signed)
Transition of Care [facility]) - Progression Note    Patient Details  Name: Kim Ross MRN: [id number] Date of Birth: 14-Jun-1956  [facility] CM/SW Contact  Leeroy Cha, RN Phone Number: 10/08/2021, 2:00 PM  Clinical Narrative:     Rn and pt will be through [facility]  Expected Discharge Plan: [place] Barriers to Discharge: Continued Medical Work up  Expected Discharge Plan and Services Expected Discharge Plan: [place]   Discharge Planning Services: CM Consult     Expected Discharge Date: 10/06/21               DME Arranged: Hospital bed DME Agency: [employer] Date DME Agency Contacted: 10/07/21 Time DME Agency Contacted: [phone] Representative spoke with at DME Agency: Brenton Grills HH Arranged: RN, PT (PEG-TF) [place] Agency: [employer], [place] Date [place]: 10/02/21 Time HH Agency Contacted: 2 Representative spoke with at [place]: Cory/Pam chandler   Social Determinants of Health ([place]) Interventions    Readmission Risk Interventions Readmission Risk Prevention Plan 09/30/2021 09/18/2021  Transportation Screening Complete Complete  Medication Review Press photographer) - Complete  PCP or Specialist appointment within 3-5 days of discharge Complete Complete  HRI or [place] - Complete  SW Recovery Care/Counseling Consult Complete -  Palliative Care Screening Complete Complete  Skilled Nursing Facility Complete Complete  Some recent data might be hidden

## 2021-10-09 ENCOUNTER — Other Ambulatory Visit: Payer: Self-pay | Admitting: Radiation Therapy

## 2021-10-09 DIAGNOSIS — C7931 Secondary malignant neoplasm of brain: Secondary | ICD-10-CM

## 2021-10-09 DIAGNOSIS — C3492 Malignant neoplasm of unspecified part of left bronchus or lung: Secondary | ICD-10-CM | POA: Diagnosis not present

## 2021-10-09 DIAGNOSIS — I824Z2 Acute embolism and thrombosis of unspecified deep veins of left distal lower extremity: Secondary | ICD-10-CM | POA: Diagnosis not present

## 2021-10-09 DIAGNOSIS — G893 Neoplasm related pain (acute) (chronic): Secondary | ICD-10-CM | POA: Diagnosis not present

## 2021-10-09 DIAGNOSIS — J910 Malignant pleural effusion: Secondary | ICD-10-CM | POA: Diagnosis not present

## 2021-10-09 LAB — CBC WITH DIFFERENTIAL/PLATELET
Abs Immature Granulocytes: 0.58 10*3/uL — ABNORMAL HIGH (ref 0.00–0.07)
Basophils Absolute: 0.1 10*3/uL (ref 0.0–0.1)
Basophils Relative: 0 %
Eosinophils Absolute: 0.3 10*3/uL (ref 0.0–0.5)
Eosinophils Relative: 1 %
HCT: 23.3 % — ABNORMAL LOW (ref 36.0–46.0)
Hemoglobin: 7.4 g/dL — ABNORMAL LOW (ref 12.0–15.0)
Immature Granulocytes: 2 %
Lymphocytes Relative: 4 %
Lymphs Abs: 1.1 10*3/uL (ref 0.7–4.0)
MCH: 30.5 pg (ref 26.0–34.0)
MCHC: 31.8 g/dL (ref 30.0–36.0)
MCV: 95.9 fL (ref 80.0–100.0)
Monocytes Absolute: 3.5 10*3/uL — ABNORMAL HIGH (ref 0.1–1.0)
Monocytes Relative: 12 %
Neutro Abs: 23.6 10*3/uL — ABNORMAL HIGH (ref 1.7–7.7)
Neutrophils Relative %: 81 %
Platelets: 496 10*3/uL — ABNORMAL HIGH (ref 150–400)
RBC: 2.43 MIL/uL — ABNORMAL LOW (ref 3.87–5.11)
RDW: 22 % — ABNORMAL HIGH (ref 11.5–15.5)
WBC: 29.2 10*3/uL — ABNORMAL HIGH (ref 4.0–10.5)
nRBC: 0.2 % (ref 0.0–0.2)

## 2021-10-09 LAB — GLUCOSE, CAPILLARY
Glucose-Capillary: 111 mg/dL — ABNORMAL HIGH (ref 70–99)
Glucose-Capillary: 128 mg/dL — ABNORMAL HIGH (ref 70–99)
Glucose-Capillary: 148 mg/dL — ABNORMAL HIGH (ref 70–99)
Glucose-Capillary: 165 mg/dL — ABNORMAL HIGH (ref 70–99)

## 2021-10-09 MED ORDER — ENOXAPARIN SODIUM 60 MG/0.6ML IJ SOSY
50.0000 mg | PREFILLED_SYRINGE | Freq: Two times a day (BID) | INTRAMUSCULAR | Status: DC
  Administered 2021-10-09 – 2021-10-16 (×14): 50 mg via SUBCUTANEOUS
  Filled 2021-10-09 (×14): qty 0.6

## 2021-10-09 MED ORDER — ENSURE ENLIVE PO LIQD
237.0000 mL | Freq: Two times a day (BID) | ORAL | Status: DC
  Administered 2021-10-10 – 2021-10-14 (×5): 237 mL via ORAL

## 2021-10-09 NOTE — Progress Notes (Signed)
Nutrition Follow-up  DOCUMENTATION CODES:   Severe malnutrition in context of chronic illness  INTERVENTION:  - continue Jevity 1.5 @ 60 ml/hr with 150 ml free water x5/day. - will order Ensure Enlive BID, each supplement provides 350 kcal and 20 grams of protein.   NUTRITION DIAGNOSIS:   Severe Malnutrition related to chronic illness, cancer and cancer related treatments as evidenced by severe fat depletion, severe muscle depletion, percent weight loss. -ongoing  GOAL:   Patient will meet greater than or equal to 90% of their needs -met with TF regimen   MONITOR:   TF tolerance, PO intake, Supplement acceptance, Labs, Weight trends, Skin   ASSESSMENT:   66 y.o. 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  She is under the care of Dr. Julien Nordmann and is receiving palliative systemic chemo and is s/p 3 cycles. She was just discharged to [facility] SNF 12/26 after recent admission (11/22 through 12/26) for A.fib RVR and large left sided pleural effusion.  She underwent thora 11/23 with 387m fluid removed which was concerning for malignancy. Readmitted with dyspnea 2/2 large recurrent pleural effusion.  PleurX placed  G-tube was placed on 09/14/21. Patient is on Dysphagia 3, thin liquids and the only documented meal intake percentages were 0% of breakfast and 0% of lunch yesterday.  Patient laying in bed with female visitor at bedside. She reports ongoing feeling of dizziness. She reports not eating breakfast this AM. She requests a vanilla Ensure and female visitors asks for several to be brought into the room. RD brought patient one bottle and let them know order would be placed so she can continue to receive this supplement.   Order in place for Jevity 1.5 @ 60 ml/hr with 150 ml free water x5/day. This regimen provides 2160 kcal, 92 grams protein, and 1844 ml free water.   Per review of order  information, tube feeding was last hung at 0316. Tube feeding is in an unlabeled kangaroo bag so time hung or what is in the bag is unable to be confirmed. RD called Pharmacy and confirmed that ready-to-hang bottles of Jevity 1.5 are available.   Weight today is -12 lb compared to weight on admission date of 12/28. Non-pitting edema to BLE documented in the edema section of flow sheet.    Labs reviewed; CBGs: 128 and 148 mg/dl, Na: 134 mmol/l, Cl: 95 mmol/l, creatinine: 0.36 mg/dl, Ca: 8.4 mg/dl.   Medications reviewed; 1 mg folvite/day, sliding scale novolog, 650 mg oral sodium bicarb per tube/day, 400 mg mag-ox BID, 15 ml multivitamin with minerals/day, 17 g miralax/day.   Diet Order:   Diet Order             DIET DYS 3 Room service appropriate? Yes; Fluid consistency: Thin  Diet effective now                   EDUCATION NEEDS:   Education needs have been addressed  Skin:  Skin Integrity Issues:: Stage II Stage II: coccyx  Last BM:  1/6 (type 6 x1, medium amount)  Height:   Ht Readings from Last 1 Encounters:  09/30/21 _0  (1.651 m)    Weight:   Wt Readings from Last 1 Encounters:  10/09/21 51.8 kg    Estimated Nutritional Needs:  Kcal:  2000-2200 Protein:  85-100g Fluid:  2L/day     JJarome Matin MS, RD, LDN Inpatient Clinical Dietitian RD pager #  available in [facility]  After hours/weekend pager # available in [facility]

## 2021-10-09 NOTE — Plan of Care (Signed)

## 2021-10-09 NOTE — Evaluation (Signed)
Physical Therapy Evaluation Patient Details Name: Kim Ross MRN: [id number] DOB: 09-24-1956 Today's Date: 10/09/2021  History of Present Illness  Patient is 66 y.o. female with known stage IV NSCLC with known left pleural involvement, metastatic pleural effusion, presented 12/27 with more shortness of breath and found to have large left recurrent malignant pleural effusion with tension component, seen by pulmonary medicine in the emergency department and improved after placement of Pleurx catheter.  Patient presented from [facility] after discharge fro [facility] on 12/26 following 34 day hospitalization. SNF and plan is to discharge home with home health.    Clinical Impression  Kim Ross is 66 y.o. female admitted with above HPI and diagnosis. Patient is currently limited by functional impairments below (see PT problem list). Patient lives with her family and reports significant decline in mobility starting with previous admission from 11/22-12/26. Patient was discharged to SNF for rehab but reports she never received therapy during stay. Patient currently required Mod+2 for bed mobility and would require max/Total +2 assist for bed to chair transfers. Patient will benefit from continued skilled PT interventions to address impairments and progress independence with mobility, recommending return home as pt has 24/7 assist of multiple family members and [facility] services. Acute PT will follow and progress as able.        Recommendations for follow up therapy are one component of a multi-disciplinary discharge planning process, led by the attending physician.  Recommendations may be updated based on patient status, additional functional criteria and insurance authorization.  Follow Up Recommendations Home health PT [facility] services; palliative)    Assistance Recommended at Discharge Frequent or constant Supervision/Assistance  Patient can return home with the following  Two people to help with  bathing/dressing/bathroom;Two people to help with walking and/or transfers;Assist for transportation;Assistance with feeding;Assistance with cooking/housework    Equipment Recommendations Hospital bed;Wheelchair (measurements PT);Wheelchair cushion (measurements PT);BSC/3in1  Recommendations for Other Services       Functional Status Assessment Patient has had a recent decline in their functional status and/or demonstrates limited ability to make significant improvements in function in a reasonable and predictable amount of time     Precautions / Restrictions Precautions Precautions: Fall Precaution Comments: high fracture risk due CA, L LE pain, watch vitals, PEG, pleurex cath Restrictions Weight Bearing Restrictions: No      Mobility  Bed Mobility Overal bed mobility: Needs Assistance Bed Mobility: Rolling;Sidelying to Sit;Sit to Sidelying Rolling: Mod assist Sidelying to sit: Mod assist;+2 for safety/equipment     Sit to sidelying: Mod assist;+2 for safety/equipment;Max assist General bed mobility comments: Mod +2 for rolling and sidelying<>sit. pt limited greatly by vertigo and BM during mobility. RN assisted as +2. Pt experienced nausea and spit up muscous sitting EOB. returned to supine and eventually partway to chair position.    Transfers                        Ambulation/Gait                  Stairs            Wheelchair Mobility    Modified Rankin (Stroke Patients Only)       Balance Overall balance assessment: Needs assistance Sitting-balance support: Feet supported;Bilateral upper extremity supported Sitting balance-Leahy Scale: Poor  Pertinent Vitals/Pain Pain Assessment: Faces Faces Pain Scale: Hurts little more Pain Location: LE's Pain Descriptors / Indicators: Discomfort;Sore;Grimacing Pain Intervention(s): Limited activity within patient's tolerance;Monitored during  session;Repositioned;Patient requesting pain meds-RN notified;RN gave pain meds during session    Home Living Family/patient expects to be discharged to:: Private residence Living Arrangements: Other (Comment) Available Help at Discharge: Family;Available 24 hours/day Type of Home: House Home Access: Stairs to enter Entrance Stairs-Rails: Can reach both Entrance Stairs-Number of Steps: 8 STE in the front front and back   Home Layout: One level Home Equipment: Rollator (4 wheels);Cane - quad;Other (comment);BSC/3in1;Hospital bed Additional Comments: uses walker more due to Lt LE pain; no falls recently, no close calls    Prior Function Prior Level of Function : Needs assist       Physical Assist : ADLs (physical);Mobility (physical) Mobility (physical): Bed mobility;Transfers ADLs (physical): Bathing;Dressing;Toileting;IADLs;Grooming Mobility Comments: Pt went to [facility] for rehab on 12/26 and reports she never saw therapy while there. ADLs Comments: family is going to help with bathing, dressing, self care, feeding, ADL"s/IADL's     Hand Dominance   Dominant Hand: Right    Extremity/Trunk Assessment   Upper Extremity Assessment Upper Extremity Assessment: Generalized weakness;Defer to OT evaluation    Lower Extremity Assessment Lower Extremity Assessment: Generalized weakness    Cervical / Trunk Assessment Cervical / Trunk Assessment: Other exceptions;Kyphotic Cervical / Trunk Exceptions: frail  Communication   Communication: No difficulties  Cognition Arousal/Alertness: Awake/alert Behavior During Therapy: WFL for tasks assessed/performed Overall Cognitive Status: Within Functional Limits for tasks assessed                                 General Comments: A&O, very ill, pleasant, limited by dizziness        General Comments      Exercises     Assessment/Plan    PT Assessment Patient needs continued PT services  PT Problem List  Decreased strength;Decreased mobility;Decreased safety awareness;Decreased knowledge of precautions;Decreased activity tolerance;Decreased balance;Decreased knowledge of use of DME;Pain       PT Treatment Interventions DME instruction;Therapeutic activities;Gait training;Therapeutic exercise;Patient/family education;Stair training;Wheelchair mobility training;Balance training;Functional mobility training    PT Goals (Current goals can be found in the Care Plan section)  Acute Rehab PT Goals Patient Stated Goal: go home PT Goal Formulation: With patient/family Time For Goal Achievement: 10/23/21 Potential to Achieve Goals: Fair    Frequency Min 2X/week     Co-evaluation               AM-PAC PT "6 Clicks" Mobility  Outcome Measure Help needed turning from your back to your side while in a flat bed without using bedrails?: A Lot Help needed moving from lying on your back to sitting on the side of a flat bed without using bedrails?: A Lot Help needed moving to and from a bed to a chair (including a wheelchair)?: A Lot Help needed standing up from a chair using your arms (e.g., wheelchair or bedside chair)?: A Lot Help needed to walk in hospital room?: A Lot Help needed climbing 3-5 steps with a railing? : Total 6 Click Score: 11    End of Session Equipment Utilized During Treatment: Oxygen Activity Tolerance: Patient limited by fatigue Patient left: in chair;with call bell/phone within reach Nurse Communication: Mobility status PT Visit Diagnosis: Muscle weakness (generalized) (M62.81);Pain;Difficulty in walking, not elsewhere classified (R26.2) Pain - Right/Left: Left Pain - part  of body: Leg    Time: 4967-5916 PT Time Calculation (min) (ACUTE ONLY): 36 min   Charges:   PT Evaluation $PT Eval Moderate Complexity: 1 Mod PT Treatments $Therapeutic Activity: 8-22 mins        Verner Mould, DPT Acute Rehabilitation Services Office [phone] Pager [phone]    Jacques Navy 10/09/2021, 5:20 PM

## 2021-10-09 NOTE — Progress Notes (Signed)
Progress Note   Patient: Kim Ross TZG:[id number] DOB: [birth year] DOA: 09/29/2021     9 DOS: the patient was seen and examined on 10/09/2021   Brief hospital course: 66 year old woman known stage IV NSCLC with known left pleural involvement, metastatic pleural effusion, presented 12/27 with more shortness of breath and found to have large left recurrent malignant pleural effusion with tension component, seen by pulmonary medicine in the emergency department and improved after placement of Pleurx catheter.  Patient declined to return to SNF and plan is to discharge home with home health.   -- Weaned off oxygen, plan for teaching of Pleurx catheter management and Lovenox injections with family, then discharged home 1/7.    Assessment and Plan * Malignant pleural effusion -- Secondary to lung cancer, recurrent in nature, therefore Pleurx catheter placed by pulmonary medicine.  Patient will go home with this, plan is to drain Pleurx every other day up to 1 L maximum.  Home health to assist as well as with Foley catheter care and PEG tube. -- Stable for discharge  Adenocarcinoma of left lung, stage 4 ([place])- (present on admission) -- Dr. Earlie Server notified of admission.  Follow-up as an outpatient.  Left leg DVT ([place])- (present on admission) -- Continue therapeutic Lovenox given underlying cancer.  Generic Lovenox very affordable less than $5 a month.  Daughter will need teaching prior to discharge on administration.  Anemia of chronic disease- (present on admission) -- Stable.  Leukocytosis- (present on admission) --Significance unclear, has been present since middle of December.  No signs or symptoms of infection.  Thought secondary to inflammation, discussed with Dr. Earlie Server, he will follow-up as an outpatient.  Protein-calorie malnutrition, severe- (present on admission) -- Continue tube feeds. --Jevity 1.5 at 60 ml/hr via G-tube -Provides 2160 kcals, 91g protein and 1094 ml  H2O -Free water recommendation 150 ml 5x daily (750 ml) -Liquid MVI daily via tube  A-fib (HCC)- (present on admission) --Continue amiodarone, enoxaparin, metoprolol.  Type 2 diabetes mellitus (HCC) -- Stable here, resume metformin on discharge.  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  Continue on discharge.  Cancer related pain- (present on admission) -- Stable.  Continue fentanyl patch, oxycodone.     Subjective:  Feels ok today  Objective Vital signs were reviewed and unremarkable. Physical Exam Constitutional:      General: She is not in acute distress.    Appearance: She is not ill-appearing or toxic-appearing.  Cardiovascular:     Rate and Rhythm: Normal rate and regular rhythm.     Heart sounds: No murmur heard. Pulmonary:     Effort: Pulmonary effort is normal. No respiratory distress.     Breath sounds: No wheezing, rhonchi or rales.  Neurological:     Mental Status: She is alert.  Psychiatric:        Mood and Affect: Mood normal.        Behavior: Behavior normal.   Data Reviewed:  CBG stable Hgb stable 7.4, WBC stable 29.2 I discussed with Dr. Earlie Server who will follow this as an outpatient, probably inflammatory in nature.  Family Communication: daughter at bedside, she confirms that the patient will have adequate 24-hour care at home, will be able to provide for all ADLs, she will take on the responsibility of learning how to manage Pleurx catheter and Lovenox injections.  Disposition: Status is: Inpatient  Remains inpatient appropriate because: complex care      Time spent:  45 minutes  Author: Murray Hodgkins, MD 10/09/2021 9:09 PM  For on call review [email or web address].

## 2021-10-09 NOTE — Progress Notes (Signed)
Was not able to get patient up to ambulate. Patient has not been up since admitted. Patient did not feel comfortable getting up to walk, and patient was also getting dizzy even when turning patient for peri and foley care. Was able to wean patient to room air with O2 sats at 100%. Passed along to dayshift nurse.

## 2021-10-10 DIAGNOSIS — I824Z2 Acute embolism and thrombosis of unspecified deep veins of left distal lower extremity: Secondary | ICD-10-CM | POA: Diagnosis not present

## 2021-10-10 DIAGNOSIS — I480 Paroxysmal atrial fibrillation: Secondary | ICD-10-CM | POA: Diagnosis not present

## 2021-10-10 DIAGNOSIS — G893 Neoplasm related pain (acute) (chronic): Secondary | ICD-10-CM | POA: Diagnosis not present

## 2021-10-10 DIAGNOSIS — C3492 Malignant neoplasm of unspecified part of left bronchus or lung: Secondary | ICD-10-CM | POA: Diagnosis not present

## 2021-10-10 LAB — GLUCOSE, CAPILLARY
Glucose-Capillary: 115 mg/dL — ABNORMAL HIGH (ref 70–99)
Glucose-Capillary: 138 mg/dL — ABNORMAL HIGH (ref 70–99)

## 2021-10-10 MED ORDER — ENSURE ENLIVE PO LIQD
237.0000 mL | Freq: Two times a day (BID) | ORAL | 2 refills | Status: AC
Start: 2021-10-10 — End: ?

## 2021-10-10 MED ORDER — SIMETHICONE 40 MG/0.6ML PO SUSP
40.0000 mg | Freq: Four times a day (QID) | ORAL | Status: DC | PRN
  Administered 2021-10-10: 40 mg via ORAL
  Filled 2021-10-10 (×3): qty 0.6

## 2021-10-10 MED ORDER — FREE WATER
150.0000 mL | Freq: Every day | Status: AC
Start: 2021-10-10 — End: ?

## 2021-10-10 MED ORDER — ENOXAPARIN SODIUM 60 MG/0.6ML IJ SOSY
50.0000 mg | PREFILLED_SYRINGE | Freq: Two times a day (BID) | INTRAMUSCULAR | 1 refills | Status: AC
Start: 2021-10-10 — End: ?

## 2021-10-10 NOTE — Progress Notes (Signed)
Progress Note   Patient: Kim Ross [id number]:[id number] DOB: November 02, 1955 DOA: 09/29/2021     10 DOS: the patient was seen and examined on 10/10/2021   Brief hospital course: 66 year old woman known stage IV NSCLC with known left pleural involvement, metastatic pleural effusion, presented 12/27 with more shortness of breath and found to have large left recurrent malignant pleural effusion with tension component, seen by pulmonary medicine in the emergency department and improved after placement of Pleurx catheter.  Patient declined to return to SNF and plan is to discharge home with home health.   -- Weaned off oxygen, plan for teaching of Pleurx catheter management and Lovenox injections with family, then discharged home 1/7.   Assessment and Plan * Malignant pleural effusion -- Secondary to lung cancer, recurrent in nature, therefore Pleurx catheter placed by pulmonary medicine.  Patient will go home with this, plan is to drain Pleurx every other day up to 1 L maximum.  Home health to assist as well as with Foley catheter care and PEG tube. -- Stable for discharge  Adenocarcinoma of left lung, stage 4 ([place])- (present on admission) -- Dr. Earlie Server notified of admission.  Follow-up as an outpatient.  Left leg DVT ([place])- (present on admission) -- Continue therapeutic Lovenox given underlying cancer.  Generic Lovenox very affordable less than $5 a month.  Daughter will need teaching prior to discharge on administration.  Anemia of chronic disease- (present on admission) -- Stable.  Leukocytosis- (present on admission) --Significance unclear, has been present since middle of December.  No signs or symptoms of infection.  Thought secondary to inflammation, discussed with Dr. Earlie Server, he will follow-up as an outpatient.  Protein-calorie malnutrition, severe- (present on admission) -- Continue tube feeds. --Jevity 1.5 at 60 ml/hr via G-tube -Provides 2160 kcals, 91g protein and 1094 ml H2O -Free  water recommendation 150 ml 5x daily (750 ml) -Liquid MVI daily via tube  A-fib (HCC)- (present on admission) --Continue amiodarone, enoxaparin, metoprolol.  Type 2 diabetes mellitus (HCC) -- Stable here, resume metformin on discharge.  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  Continue on discharge.  Cancer related pain- (present on admission) -- Stable.  Continue fentanyl patch, oxycodone.   Plan was to discharge patient home today with home health after Pleurx catheter and Lovenox teaching.  This was discussed with daughter at bedside yesterday evening and she agreed.  Today she and nephew in the hallway discussion felt they cannot care for the patient at home.  They think she needs SNF or alternative arrangements.  We discussed various options including SNF with or without palliative care, home with home health, also broached the topic of hospice.  They will engage patient from perspective of difficulty providing home care.  Will engage TOC to help plan disposition now that family is uncertain that can provide help.  Subjective:  Feels poorly today  Objective Vital signs were reviewed and unremarkable. Physical Exam Vitals reviewed.  Constitutional:      General: She is not in acute distress.    Appearance: She is ill-appearing. She is not toxic-appearing.  Cardiovascular:     Rate and Rhythm: Normal rate and regular rhythm.     Heart sounds: No murmur heard. Pulmonary:     Effort: Pulmonary effort is normal. No respiratory distress.     Breath sounds: No wheezing or rales.  Abdominal:     General: Abdomen is flat.     Palpations: Abdomen is  soft.     Tenderness: There is no abdominal tenderness.  Musculoskeletal:     Right lower leg: No edema.     Left lower leg: No edema.  Neurological:     Mental Status: She is alert.  Psychiatric:        Behavior: Behavior normal.     Comments: Appears depressed      Data Reviewed:  There are no new results to review at this time.  Family Communication: Documented above  Disposition: Status is: Inpatient  Remains inpatient appropriate because: Complex care, family not clear they are able to care for her at home         Time spent: 40 minutes  Author: Murray Hodgkins, MD 10/10/2021 5:43 PM  For on call review [email or web address].

## 2021-10-10 NOTE — Progress Notes (Signed)
Pt Pleurx catheter drained (about 6 cc) serosanguinous fluid per protocol.  Pt tolerated procedure well.  Drain site wnl.  New dressing intact.

## 2021-10-10 NOTE — Plan of Care (Signed)

## 2021-10-10 NOTE — Progress Notes (Signed)
Discussion regarding DC planning conducted with Dr. Sarajane Jews, patient Daughter Kim Ross, and nephew Marchia Bond and this RN. Discussed options and family was encouraged have an honest conversation regarding expectations and limitations of care at home and safety concerns. Family is to speak to patient and patients significant other. Will continue to advocate for patient and listen to concersn

## 2021-10-11 DIAGNOSIS — C3492 Malignant neoplasm of unspecified part of left bronchus or lung: Secondary | ICD-10-CM | POA: Diagnosis not present

## 2021-10-11 DIAGNOSIS — I824Z2 Acute embolism and thrombosis of unspecified deep veins of left distal lower extremity: Secondary | ICD-10-CM | POA: Diagnosis not present

## 2021-10-11 DIAGNOSIS — J910 Malignant pleural effusion: Secondary | ICD-10-CM | POA: Diagnosis not present

## 2021-10-11 DIAGNOSIS — E43 Unspecified severe protein-calorie malnutrition: Secondary | ICD-10-CM | POA: Diagnosis not present

## 2021-10-11 LAB — GLUCOSE, CAPILLARY
Glucose-Capillary: 143 mg/dL — ABNORMAL HIGH (ref 70–99)
Glucose-Capillary: 92 mg/dL (ref 70–99)

## 2021-10-11 NOTE — Progress Notes (Signed)
Chaplain service paged for patient at this time. Spiritual services to stop by today

## 2021-10-11 NOTE — Progress Notes (Signed)
Chaplain received a referral from Fabian's nurse to provide support and be a third- party listener to Makalia's wishes.  [place] engaged in life-review and shared about all of her children (several of whom were adopted or were nieces or nephews). She also shared about the loving support she has from her family and her husband.  She has an understanding that her time is limited, but stated that we "don't know when our time will come."  She understands that she needs additional support from a SNF and understands that she needs that level of care.  She wants to be able to go home first, to see her home again, her grandbaby who is 6 months old, and her dog.  She believes that she can go home with all of the equipment and does not understand that that might not be possible.  I recommend that Palliative gets involved to attempt to help her think through the decisions that are ahead of her.  [place], [place] Pager, [phone] 11:06 PM

## 2021-10-11 NOTE — Plan of Care (Signed)

## 2021-10-11 NOTE — Progress Notes (Addendum)
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  Patient declined to return to SNF and plan was to discharge home with home health.  2 nephews flew down from [place] to provide care, case was discussed with daughter at bedside 1/6, reviewed complex care needs and daughter felt patient can go home with robust support. --1/7 patient has changed mind and decided on SNF  Assessment and Plan * Malignant pleural effusion -- Secondary to lung cancer, recurrent in nature, therefore Pleurx catheter placed by pulmonary medicine.  Plan is to drain Pleurx every other day up to 1 L maximum.  Continue Foley catheter care and PEG tube. -- Stable for discharge  Adenocarcinoma of left lung, stage 4 ([place])- (present on admission) -- Dr. Earlie Server notified of admission.  Follow-up as an outpatient.  Left leg DVT ([place])- (present on admission) -- Continue therapeutic Lovenox given underlying cancer.    Anemia of chronic disease- (present on admission) -- Stable.  Leukocytosis- (present on admission) --Significance unclear, has been present since middle of December.  No signs or symptoms of infection.  Thought secondary to inflammation, discussed with Dr. Earlie Server, he will follow-up as an outpatient.  Protein-calorie malnutrition, severe- (present on admission) -- tolerating tube feeds. --Jevity 1.5 at 60 ml/hr via G-tube -Provides 2160 kcals, 91g protein and 1094 ml H2O -Free water recommendation 150 ml 5x daily (750 ml) -Liquid MVI daily via  tube  A-fib (HCC)- (present on admission) --Continue amiodarone, enoxaparin, metoprolol.  Type 2 diabetes mellitus (HCC) -- Stable here, resume metformin on discharge.  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  Continue on discharge.  Cancer related pain- (present on admission) -- Stable.  Continue fentanyl patch, oxycodone.     Subjective:  Feels ok Has decided to go to SNF  Objective Vital signs were reviewed and unremarkable. Physical Exam Constitutional:      General: She is not in acute distress.    Appearance: She is not ill-appearing or toxic-appearing.  Cardiovascular:     Rate and Rhythm: Normal rate and regular rhythm.     Heart sounds: No murmur heard. Pulmonary:     Effort: Pulmonary effort is normal. No respiratory distress.     Breath sounds: No wheezing, rhonchi or rales.     Comments: Decreased breath sounds on the left Musculoskeletal:     Right lower leg: No edema.     Left lower leg: No edema.  Neurological:     Mental Status: She is alert.  Psychiatric:     Comments: Appears depressed     Data Reviewed:  CBG stable  Family Communication: nephew from [place] at bedside  Disposition: Status is: Inpatient  Remains inpatient appropriate because: awaiting SNF, complex care         Time spent: 25 minutes  Author: Murray Hodgkins, MD 10/11/2021 3:59 PM  For on call review [email or web address].

## 2021-10-11 NOTE — Progress Notes (Signed)
Plurex cath drained this shift. Apprx 76ml serous output. Pt tolerated well. Procedure done per protocol.

## 2021-10-11 NOTE — TOC Progression Note (Signed)
Transition of Care [facility]) - Progression Note    Patient Details  Name: Kim Ross MRN: [id number] Date of Birth: 1956/01/18  Transition of Care [facility]) CM/SW Contact  Ross Ludwig,  Phone Number: 10/11/2021, 4:08 PM  Clinical Narrative:    CSW was informed patient want's snf now even though [facility] agreed to accept patient for home health.  SNF will have to get insurance auth, CSW to let weekday [facility] case manager know to follow up with patient and snf.   Expected Discharge Plan: [place] Barriers to Discharge: Continued Medical Work up  Expected Discharge Plan and Services Expected Discharge Plan: [place]   Discharge Planning Services: CM Consult     Expected Discharge Date: 10/10/21               DME Arranged: Hospital bed DME Agency: [employer] Date DME Agency Contacted: 10/07/21 Time DME Agency Contacted: [phone] Representative spoke with at DME Agency: Brenton Grills [place]: RN, PT (PEG-TF) [place] Agency: [employer], [place] Date [place]: 10/02/21 Time HH Agency Contacted: 48 Representative spoke with at [place]: Cory/Pam chandler   Social Determinants of Health ([place]) Interventions    Readmission Risk Interventions Readmission Risk Prevention Plan 09/30/2021 09/18/2021  Transportation Screening Complete Complete  Medication Review Press photographer) - Complete  PCP or Specialist appointment within 3-5 days of discharge Complete Complete  HRI or [place] - Complete  SW Recovery Care/Counseling Consult Complete -  Palliative Care Screening Complete Complete  Skilled Nursing Facility Complete Complete  Some recent data might be hidden

## 2021-10-12 DIAGNOSIS — E43 Unspecified severe protein-calorie malnutrition: Secondary | ICD-10-CM | POA: Diagnosis not present

## 2021-10-12 DIAGNOSIS — J910 Malignant pleural effusion: Secondary | ICD-10-CM | POA: Diagnosis not present

## 2021-10-12 DIAGNOSIS — I824Z2 Acute embolism and thrombosis of unspecified deep veins of left distal lower extremity: Secondary | ICD-10-CM | POA: Diagnosis not present

## 2021-10-12 DIAGNOSIS — C3492 Malignant neoplasm of unspecified part of left bronchus or lung: Secondary | ICD-10-CM | POA: Diagnosis not present

## 2021-10-12 LAB — GLUCOSE, CAPILLARY
Glucose-Capillary: 95 mg/dL (ref 70–99)
Glucose-Capillary: 108 mg/dL — ABNORMAL HIGH (ref 70–99)
Glucose-Capillary: 123 mg/dL — ABNORMAL HIGH (ref 70–99)
Glucose-Capillary: 127 mg/dL — ABNORMAL HIGH (ref 70–99)
Glucose-Capillary: 134 mg/dL — ABNORMAL HIGH (ref 70–99)
Glucose-Capillary: 137 mg/dL — ABNORMAL HIGH (ref 70–99)
Glucose-Capillary: 119 mg/dL — ABNORMAL HIGH (ref 70–99)
Glucose-Capillary: 93 mg/dL (ref 70–99)

## 2021-10-12 NOTE — Progress Notes (Signed)
Patients blood glucose was 127. Glucometer did not transfer data to patient's chart.

## 2021-10-12 NOTE — Progress Notes (Signed)
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  Patient declined to return to SNF and plan was to discharge home with home health.  2 nephews flew down from [place] to provide care, case was discussed with daughter at bedside 1/6, reviewed complex care needs and daughter felt patient can go home with robust support.  1/7 family felt caring for her at home would not be possible, after further discussion patient elected SNF. --1/9 stable.  Patient confirms she desires SNF.  Assessment and Plan * Malignant pleural effusion -- Secondary to lung cancer, recurrent in nature, therefore Pleurx catheter placed by pulmonary medicine.  Plan is to drain Pleurx every other day up to 1 L maximum.  Continue Foley catheter care and PEG tube. -- Stable for discharge  Adenocarcinoma of left lung, stage 4 ([place])- (present on admission) -- Dr. Earlie Server notified of admission.  Follow-up as an outpatient.  Left leg DVT ([place])- (present on admission) -- Continue therapeutic Lovenox given underlying cancer.    Anemia of chronic disease- (present on admission) -- Stable.  Leukocytosis- (present on admission) --Significance unclear, has been present since middle of December.  No signs or symptoms of infection.  Thought secondary to inflammation, discussed with Dr. Earlie Server, he will follow-up as an outpatient.  Protein-calorie malnutrition, severe- (present on admission) -- tolerating tube feeds. --Jevity 1.5 at 60 ml/hr via G-tube -Provides 2160 kcals,  91g protein and 1094 ml H2O -Free water recommendation 150 ml 5x daily (750 ml) -Liquid MVI daily via tube  A-fib (HCC)- (present on admission) --Continue amiodarone, enoxaparin, metoprolol.  Type 2 diabetes mellitus (HCC) -- Stable here, resume metformin on discharge.  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  Continue on discharge.  Cancer related pain- (present on admission) -- Stable.  Continue fentanyl patch, oxycodone.   SNF when bed available   Subjective:  Feels ok Wants to go to rehab  Objective Vital signs were reviewed and unremarkable. Physical Exam Vitals reviewed.  Constitutional:      General: She is not in acute distress.    Appearance: She is ill-appearing. She is not toxic-appearing.  Cardiovascular:     Rate and Rhythm: Normal rate and regular rhythm.     Heart sounds: No murmur heard. Pulmonary:     Effort: Pulmonary effort is normal. No respiratory distress.     Breath sounds: No wheezing, rhonchi or rales.  Musculoskeletal:     Right lower leg: No edema.     Left lower leg: No edema.  Neurological:     Mental Status: She is alert.  Psychiatric:        Behavior: Behavior normal.     Comments: Appears depressed     Data Reviewed:  CBG stable  Family Communication: husband at bedside  Disposition: Status is: Inpatient  Remains inpatient appropriate because: needs SNF         Time spent: 25 minutes  Author: Murray Hodgkins, MD 10/12/2021 4:59 PM  For on call review [email or web address].

## 2021-10-12 NOTE — TOC Progression Note (Signed)
Transition of Care [facility]) - Progression Note    Patient Details  Name: Kim Ross MRN: [id number] Date of Birth: Feb 17, 1956  Transition of Care [facility]) CM/SW Contact  Leeroy Cha, RN Phone Number: 10/12/2021, 7:51 AM  Clinical Narrative:    No bed offers at this time, many have declined due to insurance.  Will keep seeking for snf placement.   Expected Discharge Plan: [place] Barriers to Discharge: Continued Medical Work up  Expected Discharge Plan and Services Expected Discharge Plan: [place]   Discharge Planning Services: CM Consult     Expected Discharge Date: 10/10/21               DME Arranged: Hospital bed DME Agency: [employer] Date DME Agency Contacted: 10/07/21 Time DME Agency Contacted: [phone] Representative spoke with at DME Agency: Brenton Grills [place]: RN, PT (PEG-TF) [place] Agency: [employer], [place] Date [place]: 10/02/21 Time HH Agency Contacted: 59 Representative spoke with at [place]: Cory/Pam chandler   Social Determinants of Health ([place]) Interventions    Readmission Risk Interventions Readmission Risk Prevention Plan 09/30/2021 09/18/2021  Transportation Screening Complete Complete  Medication Review Press photographer) - Complete  PCP or Specialist appointment within 3-5 days of discharge Complete Complete  HRI or [place] - Complete  SW Recovery Care/Counseling Consult Complete -  Palliative Care Screening Complete Complete  Skilled Nursing Facility Complete Complete  Some recent data might be hidden

## 2021-10-13 ENCOUNTER — Inpatient Hospital Stay (HOSPITAL_COMMUNITY): Payer: 59

## 2021-10-13 DIAGNOSIS — G893 Neoplasm related pain (acute) (chronic): Secondary | ICD-10-CM | POA: Diagnosis not present

## 2021-10-13 DIAGNOSIS — R110 Nausea: Secondary | ICD-10-CM

## 2021-10-13 DIAGNOSIS — C3432 Malignant neoplasm of lower lobe, left bronchus or lung: Secondary | ICD-10-CM | POA: Diagnosis not present

## 2021-10-13 DIAGNOSIS — I824Z2 Acute embolism and thrombosis of unspecified deep veins of left distal lower extremity: Secondary | ICD-10-CM | POA: Diagnosis not present

## 2021-10-13 LAB — GLUCOSE, CAPILLARY
Glucose-Capillary: 101 mg/dL — ABNORMAL HIGH (ref 70–99)
Glucose-Capillary: 108 mg/dL — ABNORMAL HIGH (ref 70–99)
Glucose-Capillary: 118 mg/dL — ABNORMAL HIGH (ref 70–99)
Glucose-Capillary: 98 mg/dL (ref 70–99)

## 2021-10-13 MED ORDER — MELATONIN 3 MG PO TABS
3.0000 mg | ORAL_TABLET | Freq: Every day | ORAL | Status: AC
  Administered 2021-10-13 – 2021-10-14 (×2): 3 mg via ORAL
  Filled 2021-10-13 (×2): qty 1

## 2021-10-13 NOTE — Progress Notes (Signed)
Physical Therapy Treatment Patient Details Name: Kim Ross MRN: [id number] DOB: [birth year] Today's Date: 10/13/2021   History of Present Illness Patient is 66 y.o. female with known stage IV NSCLC with known left pleural involvement, metastatic pleural effusion, presented 12/27 with more shortness of breath and found to have large left recurrent malignant pleural effusion with tension component, seen by pulmonary medicine in the emergency department and improved after placement of Pleurx catheter.  Patient presented from [facility] after discharge fro [facility] on 12/26 following 34 day hospitalization. SNF and plan is to discharge home with home health.    PT Comments    Pt declined OOB due to feeling dizzy however agreeable to perform exercises in supine.  Pt now agreeable to SNF per TOC notes.  Updated d/c plan recommendations for SNF as pt would benefit.   Recommendations for follow up therapy are one component of a multi-disciplinary discharge planning process, led by the attending physician.  Recommendations may be updated based on patient status, additional functional criteria and insurance authorization.  Follow Up Recommendations  Skilled nursing-short term rehab (<3 hours/day)     Assistance Recommended at Discharge Frequent or constant Supervision/Assistance  Patient can return home with the following Two people to help with bathing/dressing/bathroom;Two people to help with walking and/or transfers;Assist for transportation;Assistance with feeding;Assistance with cooking/housework   Equipment Recommendations  Hospital bed;Wheelchair (measurements PT);Wheelchair cushion (measurements PT);BSC/3in1    Recommendations for Other Services       Precautions / Restrictions Precautions Precautions: Fall Precaution Comments: high fracture risk due CA, L LE pain, watch vitals, PEG, pleurex cath     Mobility  Bed Mobility               General bed mobility comments:  declined to mobilize, reports dizziness limiting    Transfers                        Ambulation/Gait                   Stairs             Wheelchair Mobility    Modified Rankin (Stroke Patients Only)       Balance                                            Cognition Arousal/Alertness: Awake/alert Behavior During Therapy: WFL for tasks assessed/performed Overall Cognitive Status: Within Functional Limits for tasks assessed                                 General Comments: A&O, very ill, pleasant, limited by dizziness        Exercises General Exercises - Lower Extremity Ankle Circles/Pumps: AROM;Both;Supine;15 reps Short Arc Quad: AROM;Both;10 reps;Supine Heel Slides: AROM;Supine;Both;10 reps Hip ABduction/ADduction: AROM;Both;10 reps;Supine    General Comments        Pertinent Vitals/Pain Pain Assessment: No/denies pain Pain Intervention(s): Other (comment) (RN reports pt had received pain meds this morning)    Home Living                          Prior Function            PT Goals (current goals can now be found in  the care plan section) Progress towards PT goals: Progressing toward goals    Frequency    Min 2X/week      PT Plan Current plan remains appropriate    Co-evaluation              AM-PAC PT "6 Clicks" Mobility   Outcome Measure  Help needed turning from your back to your side while in a flat bed without using bedrails?: A Lot Help needed moving from lying on your back to sitting on the side of a flat bed without using bedrails?: A Lot Help needed moving to and from a bed to a chair (including a wheelchair)?: A Lot Help needed standing up from a chair using your arms (e.g., wheelchair or bedside chair)?: A Lot Help needed to walk in hospital room?: A Lot Help needed climbing 3-5 steps with a railing? : Total 6 Click Score: 11    End of Session   Activity  Tolerance: Patient limited by fatigue Patient left: with call bell/phone within reach;in bed;with family/visitor present Nurse Communication: Mobility status PT Visit Diagnosis: Muscle weakness (generalized) (M62.81);Difficulty in walking, not elsewhere classified (R26.2)     Time: 6945-0388 PT Time Calculation (min) (ACUTE ONLY): 13 min  Charges:  $Therapeutic Exercise: 8-22 mins                    Jannette Spanner PT, DPT Acute Rehabilitation Services Pager: [phone] Office: [place] 10/13/2021, 1:35 PM

## 2021-10-13 NOTE — Progress Notes (Signed)
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  Patient declined to return to SNF and plan was to discharge home with home health.  2 nephews flew down from [place] to provide care, case was discussed with daughter at bedside 1/6, reviewed complex care needs and daughter felt patient can go home with robust support.  1/7 family felt caring for her at home would not be possible, after further discussion patient elected SNF.  At this point she has some mild nausea, will check x-ray, but appears stable for transfer to SNF.    Assessment and Plan * Malignant pleural effusion -- Secondary to lung cancer, recurrent in nature, therefore Pleurx catheter placed by pulmonary medicine.  Plan is to drain Pleurx as need for dyspnea. Output is low.  Continue Foley catheter care and PEG tube. -- respiratory status stable for discharge  Adenocarcinoma of left lung, stage 4 ([place])- (present on admission) -- Dr. Earlie Server notified of admission.  Follow-up as an outpatient.  Left leg DVT ([place])- (present on admission) -- Continue therapeutic Lovenox given underlying cancer.    Anemia of chronic disease- (present on admission) -- Stable.  Leukocytosis- (present on admission) --Significance unclear, has been present since middle of December.  No signs or symptoms of infection.  Thought secondary to inflammation, discussed with Dr. Earlie Server, he will follow-up as an outpatient.  Protein-calorie malnutrition, severe- (present on admission) -- tolerating  tube feeds. --Jevity 1.5 at 60 ml/hr via G-tube -Provides 2160 kcals, 91g protein and 1094 ml H2O -Free water recommendation 150 ml 5x daily (750 ml) -Liquid MVI daily via tube  A-fib (HCC)- (present on admission) --Continue amiodarone, enoxaparin, metoprolol.  Type 2 diabetes mellitus (HCC) -- Stable here, resume metformin on discharge.  Nausea -- Significance unclear.  We will check abdominal film but exam is benign.  I suspect this is from chronic illness.  Continue antiemetics.  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  Continue on discharge.  Cancer related pain- (present on admission) -- Stable.  Continue fentanyl patch, oxycodone.     Subjective:  Feels nauseous (has been for days she reports) Per RN tolerating TF w/o significant residuals Breathing is ok  Objective Vital signs were reviewed and unremarkable. Physical Exam Vitals reviewed.  Constitutional:      General: She is not in acute distress.    Appearance: She is ill-appearing. She is not toxic-appearing.  Cardiovascular:     Rate and Rhythm: Normal rate and regular rhythm.     Heart sounds: No murmur heard. Pulmonary:     Effort: Pulmonary effort is normal. No respiratory distress.     Breath sounds: No wheezing, rhonchi or rales.  Abdominal:     General: There is no distension.     Palpations: Abdomen is soft.     Tenderness: There is no abdominal tenderness.  Musculoskeletal:     Right lower leg: No edema.     Left lower leg: No edema.  Neurological:     Mental Status: She is alert.  Psychiatric:        Behavior: Behavior normal.     Comments: Depressed mood     Data Reviewed:  CBG stable  Family Communication: husband at bedside  Disposition: Status is: Inpatient  Remains inpatient appropriate because: needs SNF complex care         Time spent: 25 minutes  Author: Murray Hodgkins, MD 10/13/2021 4:05  PM  For on call review [email or web address].

## 2021-10-13 NOTE — TOC Progression Note (Addendum)
Transition of Care [facility]) - Progression Note    Patient Details  Name: [name] MRN: [id number] Date of Birth: 02/01/1956  Transition of Care [facility]) CM/SW Contact  Leeroy Cha, RN Phone Number: 10/13/2021, 9:30 AM  Clinical Narrative:    Tcf-daughter- [place] is holding a bed for patient.  Will hold until she is ready to go.  Will make md aware. Fl2 and csw transfer sent to [facility].  Expected Discharge Plan: [place] Barriers to Discharge: Continued Medical Work up  Expected Discharge Plan and Services Expected Discharge Plan: [place]   Discharge Planning Services: CM Consult     Expected Discharge Date: 10/10/21               DME Arranged: Hospital bed DME Agency: [employer] Date DME Agency Contacted: 10/07/21 Time DME Agency Contacted: [phone] Representative spoke with at DME Agency: Brenton Grills [place]: RN, PT (PEG-TF) [place] Agency: [employer], [place] Date [place]: 10/02/21 Time HH Agency Contacted: 22 Representative spoke with at [place]: Cory/Pam chandler   Social Determinants of Health ([place]) Interventions    Readmission Risk Interventions Readmission Risk Prevention Plan 09/30/2021 09/18/2021  Transportation Screening Complete Complete  Medication Review Press photographer) - Complete  PCP or Specialist appointment within 3-5 days of discharge Complete Complete  HRI or [place] - Complete  SW Recovery Care/Counseling Consult Complete -  Palliative Care Screening Complete Complete  Skilled Nursing Facility Complete Complete  Some recent data might be hidden

## 2021-10-13 NOTE — Plan of Care (Signed)

## 2021-10-13 NOTE — Assessment & Plan Note (Signed)
--   Significance unclear.  We will check abdominal film but exam is benign.  I suspect this is from chronic illness.  Continue antiemetics.

## 2021-10-14 ENCOUNTER — Inpatient Hospital Stay: Payer: 59 | Admitting: Physician Assistant

## 2021-10-14 ENCOUNTER — Inpatient Hospital Stay: Payer: 59

## 2021-10-14 ENCOUNTER — Other Ambulatory Visit: Payer: 59

## 2021-10-14 DIAGNOSIS — Z7189 Other specified counseling: Secondary | ICD-10-CM

## 2021-10-14 DIAGNOSIS — Z66 Do not resuscitate: Secondary | ICD-10-CM

## 2021-10-14 DIAGNOSIS — J910 Malignant pleural effusion: Secondary | ICD-10-CM | POA: Diagnosis not present

## 2021-10-14 DIAGNOSIS — Z515 Encounter for palliative care: Secondary | ICD-10-CM

## 2021-10-14 DIAGNOSIS — I480 Paroxysmal atrial fibrillation: Secondary | ICD-10-CM | POA: Diagnosis not present

## 2021-10-14 DIAGNOSIS — G893 Neoplasm related pain (acute) (chronic): Secondary | ICD-10-CM | POA: Diagnosis not present

## 2021-10-14 DIAGNOSIS — C3492 Malignant neoplasm of unspecified part of left bronchus or lung: Secondary | ICD-10-CM | POA: Diagnosis not present

## 2021-10-14 LAB — CBC
HCT: 26.3 % — ABNORMAL LOW (ref 36.0–46.0)
Hemoglobin: 7.8 g/dL — ABNORMAL LOW (ref 12.0–15.0)
MCH: 29.7 pg (ref 26.0–34.0)
MCHC: 29.7 g/dL — ABNORMAL LOW (ref 30.0–36.0)
MCV: 100 fL (ref 80.0–100.0)
Platelets: 480 10*3/uL — ABNORMAL HIGH (ref 150–400)
RBC: 2.63 MIL/uL — ABNORMAL LOW (ref 3.87–5.11)
RDW: 22 % — ABNORMAL HIGH (ref 11.5–15.5)
WBC: 31 10*3/uL — ABNORMAL HIGH (ref 4.0–10.5)
nRBC: 0.2 % (ref 0.0–0.2)

## 2021-10-14 LAB — GLUCOSE, CAPILLARY
Glucose-Capillary: 101 mg/dL — ABNORMAL HIGH (ref 70–99)
Glucose-Capillary: 105 mg/dL — ABNORMAL HIGH (ref 70–99)
Glucose-Capillary: 110 mg/dL — ABNORMAL HIGH (ref 70–99)
Glucose-Capillary: 110 mg/dL — ABNORMAL HIGH (ref 70–99)
Glucose-Capillary: 127 mg/dL — ABNORMAL HIGH (ref 70–99)

## 2021-10-14 LAB — BASIC METABOLIC PANEL
Anion gap: 11 (ref 5–15)
BUN: 16 mg/dL (ref 8–23)
CO2: 26 mmol/L (ref 22–32)
Calcium: 8.8 mg/dL — ABNORMAL LOW (ref 8.9–10.3)
Chloride: 94 mmol/L — ABNORMAL LOW (ref 98–111)
Creatinine, Ser: 0.39 mg/dL — ABNORMAL LOW (ref 0.44–1.00)
GFR, Estimated: >60 mL/min (ref >60)
Glucose, Bld: 141 mg/dL — ABNORMAL HIGH (ref 70–99)
Potassium: 4 mmol/L (ref 3.5–5.1)
Sodium: 131 mmol/L — ABNORMAL LOW (ref 135–145)
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
## 2021-10-14 NOTE — Progress Notes (Signed)
Informed Primary RN patient's bed alarm going off and upon entering the room she was throwing up.  Raised her HOB and suctioned her.  Feeding tube stopped.  Emesis resembled feeding tube.

## 2021-10-14 NOTE — Consult Note (Addendum)
Palliative Care Consult Note                                  Date: 10/14/2021   Patient Name: Kim Ross  DOB: January 19, 1956  MRN: [id number]  Age / Sex: 66 y.o., female  PCP: Erskine Emery, MD Referring Physician: Little Ishikawa, MD  Reason for Consultation: Establishing goals of care  HPI/Patient Profile: Palliative Care consult requested for goals of care discussion in this 66 y.o. 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  Patient was admitted for more than 30 days in November due to atrial fibrillation with RVR after receiving chemotherapy.  At that time she was also diagnosed with left lower extremity DVT.  She also had PEG tube placed due to severe malnutrition.  She was discharged to skilled nursing facility at that time.  Kim Ross was admitted on 09/29/2021 from SNF with shortness of breath via EMS.  She was placed on nonrebreather and received Solu-Medrol in route.  Pleurx catheter has been placed due to recurrent malignant pleural effusions.  Plan is for patient to discharge back to skilled facility.  Past Medical History:  Diagnosis Date   Anxiety and depression    Aspiration pneumonitis (HCC)    Bladder outlet obstruction    Due to spinal trauma   Cocaine abuse ([place])    Depression    Esophageal stenosis    GERD (gastroesophageal reflux disease)    Goiter    History of DVT (deep vein thrombosis)    History of radiation therapy    Brain, left chest 07/13/21-08/04/21- Dr. Gery Pray   History of recurrent UTIs    Malignant pleural effusion    Mixed hyperlipidemia    Non-small cell lung cancer metastatic to brain [facility])    Stage IV   Paroxysmal atrial fibrillation (HCC)    Pneumonia    Protein calorie malnutrition ([place])    Type 2 diabetes mellitus ([place])       Subjective:   This NP Osborne Oman reviewed medical records, received report from team, assessed the patient and then met at the patient's bedside with  to discuss diagnosis, prognosis, GOC, EOL wishes disposition and options.   Kim Ross is familiar to myself and the Palliative team. We have been involved in care during recent admissions and I also follow [facility] at [facility] for [facility] needs. I reviewed palliatives role in collaboration with her medical team. She verbalized understanding and appreciation of ongoing support.   She has a family member who was at the bedside asleep and does not engage in conversations.  We discussed Her current illness and what it means in the larger context of Her on-going co-morbidities. Natural disease trajectory and expectations were discussed.  I openly and directly shared with Kim Ross my concerns for her continued decline.  I expressed to her that I am worried that she is facing end-of-life and is not showing any significant improvement.  She is emotional expressing understanding.  She states "I am getting tired".  I created space and opportunity for her to further express her feelings and elaborate on her statement.  She shares although she is not ready to pass away she know her time is coming but continues to fight for her family is much as possible.  She acknowledges  that she has been engaging with ongoing discussions and sharing with them that she is getting tired mentally and physically.  She feels that her family is supportive but also does not want her to just give up.  I explained sometimes goals change to a different focus but this does not mean that she is giving up.  I expressed if her focus is to be comfortable for what time that she has left she is indeed continuing to fight but in a different direction.  She would be fighting for her comfort and peace at end-of-life.  She verbalized understanding expressing wishes to continue  having discussions with her family amongst themselves.  Kim Ross is clear and expressed wishes her goal is to continue to treat the treatable at this time with plans on discharging to a skilled facility to allow her every opportunity to continue to thrive for as long as she can.  Again I offered to have ongoing family discussion however she declines expressing her family is more receptive to conversations amongst themselves but they are not "nave" to what is going on.  I discussed the importance of continued conversation with family and their medical providers regarding overall plan of care and treatment options, ensuring decisions are within the context of the patients values and GOCs.  Questions and concerns were addressed.  Patient was encouraged to call with questions or concerns.  PMT will continue to support holistically as needed.   Objective:   Primary Diagnoses: Present on Admission:  Adenocarcinoma of left lung, stage 4 (HCC)  Generalized anxiety disorder  A-fib (HCC)  Protein-calorie malnutrition, severe  Leukocytosis  Hyperlipidemia  Anemia of chronic disease  Pressure injury of skin  Left leg DVT (HCC)  Cancer related pain   Scheduled Meds:  amiodarone  200 mg Per Tube Daily   chlorhexidine  15 mL Mouth Rinse BID   Chlorhexidine Gluconate Cloth  6 each Topical Daily   enoxaparin (LOVENOX) injection  50 mg Subcutaneous Q12H   feeding supplement  237 mL Oral BID BM   fentaNYL  1 patch Transdermal E99B   folic acid  1 mg Per Tube Daily   free water  150 mL Per Tube 5 X Daily   insulin aspart  0-9 Units Subcutaneous Q6H   magnesium oxide  400 mg Per Tube BID   meclizine  25 mg Per Tube BID   mouth rinse  15 mL Mouth Rinse q12n4p   melatonin  3 mg Oral QHS   metoprolol tartrate  25 mg Per Tube BID   midodrine  2.5 mg Per Tube TID WC   mirtazapine  15 mg Per Tube QHS   multivitamin  15 mL Per Tube Daily   polyethylene glycol  17 g Per Tube  Daily   sertraline  50 mg Per Tube Daily    Continuous Infusions:  feeding supplement (JEVITY 1.5 CAL/FIBER) 1,000 mL (10/14/21 0153)    PRN Meds: acetaminophen (TYLENOL) oral liquid 160 mg/5 mL, [DISCONTINUED] acetaminophen **OR** acetaminophen, albuterol, bisacodyl, ondansetron **OR** ondansetron (ZOFRAN) IV, oxyCODONE, prochlorperazine, simethicone  Allergies  Allergen Reactions   Ibuprofen Nausea And Vomiting   Trazodone And Nefazodone     AM Dizziness & Drowsiness     Review of Systems  Constitutional:  Positive for appetite change.  Musculoskeletal:  Positive for arthralgias.  Neurological:  Positive for weakness.  Unless otherwise noted, a complete review of systems is negative.  Physical Exam General: NAD, frail, cachectic, chronically-ill appearing Cardiovascular: regular rate and rhythm  Pulmonary: clear ant fields, diminished bilaterally  Abdomen: soft, nontender, + bowel sounds Extremities: no edema, no joint deformities Skin: no rashes, warm and dry, muscle wasting Neurological: AAOx3, seems somewhat withdrawn  Vital Signs:  BP 106/71 (BP Location: Right Arm)    Pulse 88    Temp 97.7 F (36.5 C) (Oral)    Resp 19    Ht _0  (1.651 m)    Wt 50.9 kg    SpO2 96%    BMI 18.67 kg/m  Pain Scale: 0-10 POSS *See Group Information*: 1-Acceptable,Awake and alert Pain Score: 0-No pain  SpO2: SpO2: 96 % O2 Device:SpO2: 96 % O2 Flow Rate: .O2 Flow Rate (L/min): 1 L/min  IO: Intake/output summary:  Intake/Output Summary (Last 24 hours) at 10/14/2021 1327 Last data filed at 10/14/2021 0631 Gross per 24 hour  Intake 1590 ml  Output 1100 ml  Net 490 ml    LBM: Last BM Date: 10/13/21 Baseline Weight: Weight: 57 kg Most recent weight: Weight: 50.9 kg      Palliative Assessment/Data: PPS 20%   Advanced Care Planning:   Primary Decision Maker: PATIENT  Code Status/Advance Care Planning: DNR  Kim Ross confirms wishes for DNR/DNI.  Hospice and Palliative  Care services outpatient were explained and offered. Patient verbalized understanding and awareness of both palliative and hospice's goals and philosophy of care.   Assessment & Plan:   SUMMARY OF RECOMMENDATIONS   DNR/DNI-as confirmed by patient Continue with current plan of care.  I am concerned patient is facing end-of-life rapidly.  I had a direct and open conversation with her expressing concerns of her continued decline.  Patient verbalized understanding also expressing her awareness.  States she is getting tired but is trying to hold off for her family.  Offered to arrange family meeting for further discussions however she declined expressing her family is more receptive when she tells them things versus the medical team.  I emphasized the importance of ongoing family discussions with realistic expectations regarding her poor prognosis.  She feels her family is very supportive.  Patient states her goal is to go to rehab with understanding her time is most likely limited but still with optimism of some ability to "buy her some time". Ongoing goals of care discussions.  Patient is residential hospice appropriate if decisions were to be made to focus solely on her comfort and end-of-life.  Although this may not happen during his current admission it will being in the near future if patient survives and returns to the hospital. PMT will continue to support and follow as needed. Please call team line with urgent needs.   Palliative Prophylaxis:  Frequent Pain Assessment  Additional Recommendations (Limitations, Scope, Preferences): Continue to treat the treatable, DNR/DNI  Psycho-social/Spiritual:  Desire for further Chaplaincy support: yes Additional Recommendations: Education on Hospice  Prognosis:  < 6 months or sooner in the setting of deconditioning, generalized weakness, cachexia, severe protein malnutrition, albumin on 12/29 was 1.5, atrial fibrillation, stage IV non-small cell lung  cancer, malignant pleural effusions with Pleurx catheter placed.  Discharge Planning:  [facility] for rehab with Palliative care service follow-up     Patient expressed understanding and was in agreement with this plan.   Time Total: 50 min   Visit consisted of counseling and education dealing with the complex and emotionally intense issues of symptom management and palliative care in the setting of serious and potentially life-threatening illness.Greater than 50%  of this time was spent counseling and coordinating care  related to the above assessment and plan.  Signed by:  Alda Lea, AGPCNP-BC Palliative Medicine Team  Phone: [phone] Pager: [phone] Amion: Bjorn Pippin   Thank you for allowing the Palliative Medicine Team to assist in the care of this patient. Please utilize secure chat with additional questions, if there is no response within 30 minutes please call the above phone number. Palliative Medicine Team providers are available by phone from 7am to 5pm daily and can be reached through the team cell phone.  Should this patient require assistance outside of these hours, please call the patient's attending physician.

## 2021-10-14 NOTE — Progress Notes (Signed)
Patient being fed via feeding tube was noticed to be vomiting hence feeding tube stopped and on call notified.

## 2021-10-15 DIAGNOSIS — J910 Malignant pleural effusion: Secondary | ICD-10-CM | POA: Diagnosis not present

## 2021-10-15 DIAGNOSIS — C3492 Malignant neoplasm of unspecified part of left bronchus or lung: Secondary | ICD-10-CM | POA: Diagnosis not present

## 2021-10-15 LAB — GLUCOSE, CAPILLARY
Glucose-Capillary: 100 mg/dL — ABNORMAL HIGH (ref 70–99)
Glucose-Capillary: 105 mg/dL — ABNORMAL HIGH (ref 70–99)
Glucose-Capillary: 119 mg/dL — ABNORMAL HIGH (ref 70–99)
Glucose-Capillary: 95 mg/dL (ref 70–99)

## 2021-10-15 MED ORDER — OSMOLITE 1.5 CAL PO LIQD
1000.0000 mL | ORAL | Status: DC
  Administered 2021-10-15: 1000 mL
  Filled 2021-10-15 (×4): qty 1000

## 2021-10-15 MED ORDER — MELATONIN 3 MG PO TABS
3.0000 mg | ORAL_TABLET | Freq: Once | ORAL | Status: AC
  Administered 2021-10-15: 3 mg via ORAL
  Filled 2021-10-15: qty 1
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
## 2021-10-15 NOTE — Progress Notes (Signed)
Feeding via PEG Tube on hold per on call's order on account of vomiting .

## 2021-10-15 NOTE — Progress Notes (Signed)
Nutrition Follow-up  DOCUMENTATION CODES:   Severe malnutrition in context of chronic illness  INTERVENTION:  - will adjust TF regimen: transition back to Osmolite 1.5. - Osmolite 1.5 @ 20 ml/hr to advance by 10 ml every 12 hours to reach goal rate of 60 ml/hr with 150 ml free water x5/day.  - at goal rate, this regimen will provide 2160 kcal, 90 grams protein, and 1850 ml free water.   NUTRITION DIAGNOSIS:   Severe Malnutrition related to chronic illness, cancer and cancer related treatments as evidenced by severe fat depletion, severe muscle depletion, percent weight loss. -ongoing  GOAL:   Patient will meet greater than or equal to 90% of their needs -to be met with TF regimen  MONITOR:   TF tolerance, PO intake, Supplement acceptance, Labs, Weight trends, Skin  ASSESSMENT:   66 y.o. 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  She is under the care of Dr. Julien Nordmann and is receiving palliative systemic chemo and is s/p 3 cycles. She was just discharged to [facility] SNF 12/26 after recent admission (11/22 through 12/26) for A.fib RVR and large left sided pleural effusion.  She underwent thora 11/23 with 317ml fluid removed which was concerning for malignancy. Readmitted with dyspnea 2/2 large recurrent pleural effusion.  PleurX placed  G-tube placed on 09/14/21. She is on Dysphagia 3, thin liquids with Ensure Enlive ordered BID which she has been accepting ~75% of the time offered.  The only documented meal intakes over the past 1 week were 0% of breakfast and lunch on 1/9 and 0% of breakfast and dinner on 1/10.  TF regimen: Jevity 1.5 @ 60 ml/hr with 150 ml free water x5/day. This regimen provides 2160 kcal, 90 grams protein, 30 grams fiber, and 1844 ml free water.  Patient was changed from Osmolite 1.5 (fiber-free formula) to Jevity 1.5 on 10/02/21 d/t plan at that time to d/c with [facility]  following and [facility] did not have Osmolite 1.5 available.   Patient noted by RN at ~2245 yesterday to be vomiting what appeared to be tube feeding. Tube feeding has been on hold since that time.   Able to communicate with MD and RN via secure chat. Plan for TF resumption as outlined above.   Palliative Care is following and saw patient yesterday afternoon. PPS 20%. She is DNR/DNI.    Labs reviewed; CBGs: 105, 101, 127, 110, 110, 105 mg/dl, Na: 131 mmol/l, Cl: 94 mmol/l, creatinine: 0.39 mg/dl, Ca: 8.8 mg/dl.  Medications reviewed; 1 mg folvite/day, sliding scale novolog, 400 mg mag-ox per tube BID, 3 mg melatonin x1 dose 1/12, 15 ml multivitamin/day, 17 g miralax/day.   Diet Order:   Diet Order             Diet - low sodium heart healthy           DIET DYS 3 Room service appropriate? Yes; Fluid consistency: Thin  Diet effective now                   EDUCATION NEEDS:   Education needs have been addressed  Skin:  Skin Integrity Issues:: Stage II Stage II: coccyx  Last BM:  1/10 (type 6, large amount)  Height:   Ht Readings from Last 1 Encounters:  09/30/21 $RemoveB'5\' 5"'tkizWccf$  (1.651 m)    Weight:   Wt Readings from Last 1 Encounters:  10/14/21 50.9 kg     Estimated Nutritional  Needs:  Kcal:  2000-2200 Protein:  85-100g Fluid:  2L/day     Kim Matin, MS, RD, LDN Inpatient Clinical Dietitian RD pager # available in [place]  After hours/weekend pager # available in [facility]

## 2021-10-16 LAB — GLUCOSE, CAPILLARY
Glucose-Capillary: 122 mg/dL — ABNORMAL HIGH (ref 70–99)
Glucose-Capillary: 114 mg/dL — ABNORMAL HIGH (ref 70–99)
Glucose-Capillary: 115 mg/dL — ABNORMAL HIGH (ref 70–99)

## 2021-10-16 MED ORDER — MORPHINE SULFATE (PF) 2 MG/ML IV SOLN
2.0000 mg | INTRAVENOUS | Status: DC | PRN
Start: 2021-10-16 — End: 2021-10-20
  Administered 2021-10-19: 2 mg via INTRAVENOUS
  Filled 2021-10-16: qty 1

## 2021-10-16 MED ORDER — TRAZODONE HCL 50 MG PO TABS: 50.0000 mg | ORAL_TABLET | Freq: Every day | ORAL | Status: DC

## 2021-10-16 MED ORDER — TRAZODONE HCL 100 MG PO TABS
100.0000 mg | ORAL_TABLET | Freq: Every day | ORAL | Status: DC
  Administered 2021-10-16: 100 mg via ORAL
  Administered 2021-10-17: 50 mg via ORAL
  Administered 2021-10-18: 100 mg via ORAL
  Administered 2021-10-19: 50 mg via ORAL
  Filled 2021-10-16 (×4): qty 1

## 2021-10-16 NOTE — Progress Notes (Addendum)
PROGRESS NOTE    Kim Ross  PJA:[id number] DOB: 01-06-1956 DOA: 09/29/2021 PCP: Erskine Emery, MD   Brief Narrative:  66 year old woman known stage IV NSCLC with known left pleural involvement, metastatic pleural effusion, presented 12/27 with more shortness of breath and found to have large left recurrent malignant pleural effusion with tension component, seen by pulmonary medicine in the emergency department and improved after placement of Pleurx catheter.  Patient declined to return to SNF and plan was to discharge home with home health.  2 nephews flew down from [place] to provide care, case was discussed with daughter at bedside 1/6, reviewed complex care needs and daughter felt patient can go home with robust support.  1/7 family felt caring for her at home would not be possible, after further discussion patient elected SNF.    Patient remains medically stable for discharge to skilled nursing facility.  Awaiting bed offer and insurance approval.   Assessment and Plan  Goals of care -Patient reevaluated this evening with daughter at bedside, lengthy discussion about goals of care.  Patient and daughter agreeable to transition forward with comfort measures given her inability to cooperate or participate in physical therapy, previous plan for discharge to SNF seems fruitless.  We will discuss with palliative care over the weekend for possible disposition home with hospice versus hospice house, however given patient's progressing disease in hospital demise is not unexpected.  Nausea, secondary to tube feeds -- Continues to be poorly controlled with anti-emetics - stop tube feeds. She indicates nausea started back after resuming tube feeds and worsened over the past 24h, will follow symptoms after tube feed pause.  Malignant pleural effusion, POA, resolving -- Secondary to lung cancer, recurrent in nature, therefore Pleurx catheter placed by pulmonary medicine.  Plan is to drain Pleurx  as need for dyspnea. Output is low.  Continue Foley catheter care and PEG tube. -- respiratory status stable for discharge   Adenocarcinoma of left lung, stage 4 ([name])- (present on admission) -- Dr. Earlie Server notified of admission.  Follow-up as an outpatient.   Left leg DVT ([name])- (present on admission) -- Continue therapeutic Lovenox given underlying cancer.     Anemia of chronic disease- (present on admission) -- Stable.   Chronic leukocytosis- (present on admission) -Unclear etiology, elevated through December now for nearly 4 weeks, likely reactive in the setting of malignancy.  Appreciate oncology insight and recommendations.  Plan for further outpatient work-up with oncology.    Protein-calorie malnutrition, severe- (present on admission) -- tolerating tube feeds. --Jevity 1.5 at 60 ml/hr via G-tube -Provides 2160 kcals, 91g protein and 1094 ml H2O -Free water recommendation 150 ml 5x daily (750 ml) -Liquid MVI daily via tube   A-fib (HCC)- (present on admission) --Continue amiodarone, enoxaparin, metoprolol.   Type 2 diabetes mellitus (HCC) -- Stable here, resume metformin on discharge.   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  Continue on discharge.   Cancer related pain- (present on admission) -- Stable.  Continue fentanyl patch, oxycodone.  DVT prophylaxis: Lovenox Code Status: DNR Family Communication: At bedside  Status is: Inpatient  Dispo: The patient is from: Home              Anticipated d/c is to: SNF              Anticipated d/c date is: 24 to 48 hours  Patient currently is medically stable for discharge  Consultants:  Oncology  Subjective: No acute issues or events overnight - continues to complain of worsening nausea with tube feeds - they will be discontinued for comfort.  Objective: Vitals:   10/15/21 0618 10/15/21 1223 10/15/21 1929 10/16/21 0449  BP: 133/73  117/63 140/71 133/75  Pulse: 92 80 87 92  Resp: 18 18 (!) 22 20  Temp: (!) 97.5 F (36.4 C) 97.8 F (36.6 C) 97.9 F (36.6 C) (!) 97.5 F (36.4 C)  TempSrc: Oral Oral Oral Oral  SpO2: 93% 100% 100% 100%  Weight:    53.8 kg  Height:        Intake/Output Summary (Last 24 hours) at 10/16/2021 0733 Last data filed at 10/15/2021 1834 Gross per 24 hour  Intake --  Output 550 ml  Net -550 ml    Filed Weights   10/10/21 0500 10/14/21 0500 10/16/21 0449  Weight: 50.7 kg 50.9 kg 53.8 kg    Examination:  General: Gaunt appearing female resting comfortably in bed, no acute distress  HEENT:  Normocephalic atraumatic.  Sclerae nonicteric, noninjected.  Extraocular movements intact bilaterally. Neck:  Without mass or deformity.  Trachea is midline. Lungs:  Clear to auscultate bilaterally without rhonchi, wheeze, or rales. Heart:  Regular rate and rhythm.  Without murmurs, rubs, or gallops. Abdomen:  Soft, nontender, nondistended.  Without guarding or rebound. Extremities: Without cyanosis, clubbing, edema, or obvious deformity. Vascular:  Dorsalis pedis and posterior tibial pulses palpable bilaterally. Skin:  Warm and dry, no erythema, no ulcerations.  Data Reviewed: I have personally reviewed following labs and imaging studies  CBC: Recent Labs  Lab 10/14/21 0441  WBC 31.0*  HGB 7.8*  HCT 26.3*  MCV 100.0  PLT 480*    Basic Metabolic Panel: Recent Labs  Lab 10/14/21 0441  NA 131*  K 4.0  CL 94*  CO2 26  GLUCOSE 141*  BUN 16  CREATININE 0.39*  CALCIUM 8.8*    GFR: Estimated Creatinine Clearance: 60.3 mL/min (A) (by C-G formula based on SCr of 0.39 mg/dL (L)). Liver Function Tests: No results for input(s): AST, ALT, ALKPHOS, BILITOT, PROT, ALBUMIN in the last 168 hours. No results for input(s): LIPASE, AMYLASE in the last 168 hours. No results for input(s): AMMONIA in the last 168 hours. Coagulation Profile: No results for input(s): INR, PROTIME in the last 168  hours. Cardiac Enzymes: No results for input(s): CKTOTAL, CKMB, CKMBINDEX, TROPONINI in the last 168 hours. BNP (last 3 results) No results for input(s): PROBNP in the last 8760 hours. HbA1C: No results for input(s): HGBA1C in the last 72 hours. CBG: Recent Labs  Lab 10/15/21 0618 10/15/21 1306 10/15/21 1807 10/15/21 2339 10/16/21 0540  GLUCAP 105* 95 100* 119* 122*    Lipid Profile: No results for input(s): CHOL, HDL, LDLCALC, TRIG, CHOLHDL, LDLDIRECT in the last 72 hours. Thyroid Function Tests: No results for input(s): TSH, T4TOTAL, FREET4, T3FREE, THYROIDAB in the last 72 hours. Anemia Panel: No results for input(s): VITAMINB12, FOLATE, FERRITIN, TIBC, IRON, RETICCTPCT in the last 72 hours. Sepsis Labs: No results for input(s): PROCALCITON, LATICACIDVEN in the last 168 hours.  No results found for this or any previous visit (from the past 240 hour(s)).       Radiology Studies: No results found.      Scheduled Meds:  amiodarone  200 mg Per Tube Daily   chlorhexidine  15 mL Mouth Rinse BID   Chlorhexidine Gluconate Cloth  6 each Topical Daily  enoxaparin (LOVENOX) injection  50 mg Subcutaneous Q12H   feeding supplement  237 mL Oral BID BM   fentaNYL  1 patch Transdermal G95A   folic acid  1 mg Per Tube Daily   free water  150 mL Per Tube 5 X Daily   insulin aspart  0-9 Units Subcutaneous Q6H   magnesium oxide  400 mg Per Tube BID   meclizine  25 mg Per Tube BID   mouth rinse  15 mL Mouth Rinse q12n4p   metoprolol tartrate  25 mg Per Tube BID   midodrine  2.5 mg Per Tube TID WC   mirtazapine  15 mg Per Tube QHS   multivitamin  15 mL Per Tube Daily   polyethylene glycol  17 g Per Tube Daily   sertraline  50 mg Per Tube Daily   Continuous Infusions:  feeding supplement (OSMOLITE 1.5 CAL) 1,000 mL (10/15/21 2358)     LOS: 16 days   Time spent: 78min  Madison Albea C Lajean Boese, DO [facility]  If 7PM-7AM, please contact  night-coverage [email or web address]  10/16/2021, 7:33 AM

## 2021-10-16 NOTE — Progress Notes (Signed)
Pt stated "I cant breath". Pt looked relax and 02 sat is 100% on RA. Applied 02 for pt's comfort and per husband request.  Pt said she feels better. Nightshift RN attempted to drain Pleurex due to complaint of dizziness and pt felt like fluid is building up. There was 0cc drained. Will cont to monitor pt.

## 2021-10-16 NOTE — Progress Notes (Incomplete)
° °  Palliative Medicine Inpatient Follow Up Note     Chart Reviewed. Patient assessed at the bedside.   I am hoping to arrange a family meeting no later than Monday and have further goals of care discussions. Patient's prognosis is poor. She is unable to tolerate tube feedings due to significant nausea and vomiting. She is weak appearing. Cachectic with muscle wasting.     Discussed the importance of continued conversation with family and their  medical providers regarding overall plan of care and treatment options, ensuring decisions are within the context of the patients values and GOCs.   Questions addressed and support provided.    Objective Assessment: Vital Signs Vitals:   10/16/21 0449 10/16/21 1337  BP: 133/75 126/71  Pulse: 92 86  Resp: 20 16  Temp: (!) 97.5 F (36.4 C) 97.6 F (36.4 C)  SpO2: 100% 100%    Intake/Output Summary (Last 24 hours) at 10/16/2021 1800 Last data filed at 10/16/2021 1541 Gross per 24 hour  Intake --  Output 1100 ml  Net -1100 ml   Last Weight  Most recent update: 10/16/2021  4:50 AM    Weight  53.8 kg (118 lb 8 oz)            Gen:  NAD HEENT: moist mucous membranes CV: Regular rate and rhythm, no murmurs rubs or gallops PULM: clear to auscultation bilaterally. No wheezes/rales/rhonchi*** ABD: soft/nontender/nondistended/normal bowel sounds*** EXT: No edema*** Neuro: Alert and oriented x3***  SUMMARY OF RECOMMENDATIONS   Continue with current plan of care per medical team  PMT will continue to support and follow on as needed basis. Please secure chat for urgent needs.   Discussed with Dr. Marland Kitchen  Time Total: ***  Visit consisted of counseling and education dealing with the complex and emotionally intense issues of symptom management and palliative care in the setting of serious and potentially life-threatening illness.Greater than 50%  of this time was spent counseling and coordinating care related to the above assessment and  plan.  Alda Lea, AGPCNP-BC  Palliative Medicine Team [phone]  Palliative Medicine Team providers are available by phone from 7am to 7pm daily and can be reached through the team cell phone. Should this patient require assistance outside of these hours, please call the patient's attending physician.

## 2021-10-16 NOTE — Progress Notes (Signed)
PT Cancellation Note  Patient Details Name: Kim Ross MRN: [id number] DOB: 16-Oct-1955   Cancelled Treatment:    Reason Eval/Treat Not Completed: Patient declined, no reason specified Pt declined to mobilize today.  Reports nausea.  RN states she has been medicated.  Noted palliative also involved.  Will check back as schedule permits.   [name] 10/16/2021, 10:42 AM Arlyce Dice, DPT Acute Rehabilitation Services Pager: [phone] Office: [phone]

## 2021-10-17 LAB — GLUCOSE, CAPILLARY
Glucose-Capillary: 110 mg/dL — ABNORMAL HIGH (ref 70–99)
Glucose-Capillary: 121 mg/dL — ABNORMAL HIGH (ref 70–99)
Glucose-Capillary: 121 mg/dL — ABNORMAL HIGH (ref 70–99)
Glucose-Capillary: 145 mg/dL — ABNORMAL HIGH (ref 70–99)
Glucose-Capillary: 152 mg/dL — ABNORMAL HIGH (ref 70–99)

## 2021-10-17 NOTE — Progress Notes (Signed)
Patient is c/o nausea and vomiting. Emesis resembles tube feeding in color. Per patient and husband's request, tube feeding stopped temporarily. PRN given for n/v. Will attempt restarting tube feeding upon patient and family request.Aylan Bayona Maralyn Sago, RN

## 2021-10-17 NOTE — Progress Notes (Signed)
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  Patient declined to return to SNF and plan was to discharge home with home health.  2 nephews flew down from [place] to provide care, case was discussed with daughter at bedside 1/6, reviewed complex care needs and daughter felt patient can go home with robust support.  1/7 family felt caring for her at home would not be possible, after further discussion patient elected SNF.     Assessment and Plan  Goals of care -Continue to progress with comfort measures per discussion with daughter yesterday, patient is less responsive today, continues to be comfortable but family at bedside indicates she is having some nausea with tube feeds again poorly controlled with Zofran, we will discontinue tube feeds entirely at this point.  Nausea, secondary to tube feeds --Uncontrolled, discontinue tube feeds given perimortem status as above.  Malignant pleural effusion, POA, resolving -- Secondary to lung cancer, recurrent in nature, therefore Pleurx catheter placed by pulmonary medicine.  Plan is to drain Pleurx as need for dyspnea. Output is low.  Continue Foley catheter care and PEG tube. -- respiratory status stable for discharge   Adenocarcinoma of left lung, stage 4 ([place])- (present on admission) -- Dr. Earlie Server notified of admission.  Follow-up as an outpatient.   Left leg DVT ([place])- (present on admission) -- Continue therapeutic Lovenox given underlying cancer.     Anemia of chronic disease- (present on admission) -- Stable.   Chronic  leukocytosis- (present on admission) -Unclear etiology, elevated through December now for nearly 4 weeks, likely reactive in the setting of malignancy.  Appreciate oncology insight and recommendations.  Plan for further outpatient work-up with oncology.    Protein-calorie malnutrition, severe- (present on admission) -- tolerating tube feeds. --Jevity 1.5 at 60 ml/hr via G-tube -Provides 2160 kcals, 91g protein and 1094 ml H2O -Free water recommendation 150 ml 5x daily (750 ml) -Liquid MVI daily via tube   A-fib (HCC)- (present on admission) --Continue amiodarone, enoxaparin, metoprolol.   Type 2 diabetes mellitus (HCC) -- Stable here, resume metformin on discharge.   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  Continue on discharge.   Cancer related pain- (present on admission) -- Stable.  Continue fentanyl patch, oxycodone.  DVT prophylaxis: Lovenox Code Status: DNR Family Communication: At bedside  Status is: Inpatient  Dispo: The patient is from: Home              Anticipated d/c is to: SNF              Anticipated d/c date is: 24 to 48 hours              Patient currently is medically stable for discharge  Consultants:  Oncology  Subjective: No acute issues or events overnight - continues to complain of worsening nausea with tube feeds - they will be discontinued for comfort.  Objective: Vitals:   10/16/21 0449 10/16/21 1337 10/17/21 0500 10/17/21 0612  BP: 133/75 126/71  116/68  Pulse: 92 86  91  Resp: 20 16  18   Temp: Marland Kitchen)  97.5 F (36.4 C) 97.6 F (36.4 C)  98.2 F (36.8 C)  TempSrc: Oral Oral  Oral  SpO2: 100% 100%  100%  Weight: 53.8 kg  50.6 kg   Height:        Intake/Output Summary (Last 24 hours) at 10/17/2021 0758 Last data filed at 10/17/2021 0640 Gross per 24 hour  Intake 1148 ml  Output 1350 ml  Net -202 ml    Filed Weights   10/14/21 0500 10/16/21 0449 10/17/21 0500   Weight: 50.9 kg 53.8 kg 50.6 kg    Examination:  General: Gaunt appearing female resting comfortably in bed, no acute distress  HEENT:  Normocephalic atraumatic.  Sclerae nonicteric, noninjected.  Extraocular movements intact bilaterally. Neck:  Without mass or deformity.  Trachea is midline. Lungs:  Clear to auscultate bilaterally without rhonchi, wheeze, or rales. Heart:  Regular rate and rhythm.  Without murmurs, rubs, or gallops. Abdomen:  Soft, nontender, nondistended.  Without guarding or rebound. Extremities: Without cyanosis, clubbing, edema, or obvious deformity. Vascular:  Dorsalis pedis and posterior tibial pulses palpable bilaterally. Skin:  Warm and dry, no erythema, no ulcerations.  Data Reviewed: I have personally reviewed following labs and imaging studies  CBC: Recent Labs  Lab 10/14/21 0441  WBC 31.0*  HGB 7.8*  HCT 26.3*  MCV 100.0  PLT 480*    Basic Metabolic Panel: Recent Labs  Lab 10/14/21 0441  NA 131*  K 4.0  CL 94*  CO2 26  GLUCOSE 141*  BUN 16  CREATININE 0.39*  CALCIUM 8.8*    GFR: Estimated Creatinine Clearance: 56.7 mL/min (A) (by C-G formula based on SCr of 0.39 mg/dL (L)). Liver Function Tests: No results for input(s): AST, ALT, ALKPHOS, BILITOT, PROT, ALBUMIN in the last 168 hours. No results for input(s): LIPASE, AMYLASE in the last 168 hours. No results for input(s): AMMONIA in the last 168 hours. Coagulation Profile: No results for input(s): INR, PROTIME in the last 168 hours. Cardiac Enzymes: No results for input(s): CKTOTAL, CKMB, CKMBINDEX, TROPONINI in the last 168 hours. BNP (last 3 results) No results for input(s): PROBNP in the last 8760 hours. HbA1C: No results for input(s): HGBA1C in the last 72 hours. CBG: Recent Labs  Lab 10/16/21 0540 10/16/21 1240 10/16/21 1628 10/17/21 0006 10/17/21 0610  GLUCAP 122* 114* 115* 121* 152*    Lipid Profile: No results for input(s): CHOL, HDL, LDLCALC, TRIG, CHOLHDL,  LDLDIRECT in the last 72 hours. Thyroid Function Tests: No results for input(s): TSH, T4TOTAL, FREET4, T3FREE, THYROIDAB in the last 72 hours. Anemia Panel: No results for input(s): VITAMINB12, FOLATE, FERRITIN, TIBC, IRON, RETICCTPCT in the last 72 hours. Sepsis Labs: No results for input(s): PROCALCITON, LATICACIDVEN in the last 168 hours.  No results found for this or any previous visit (from the past 240 hour(s)).       Radiology Studies: No results found.      Scheduled Meds:  amiodarone  200 mg Per Tube Daily   chlorhexidine  15 mL Mouth Rinse BID   Chlorhexidine Gluconate Cloth  6 each Topical Daily   feeding supplement  237 mL Oral BID BM   fentaNYL  1 patch Transdermal Q72H   free water  150 mL Per Tube 5 X Daily   meclizine  25 mg Per Tube BID   mouth rinse  15 mL Mouth Rinse q12n4p   polyethylene glycol  17 g Per Tube Daily   traZODone  100 mg Oral QHS   Continuous Infusions:  feeding  supplement (OSMOLITE 1.5 CAL) 50 mL/hr at 10/17/21 0016     LOS: 17 days   Time spent: 41min  Gracyn Santillanes C Duchess Armendarez, DO [facility]  If 7PM-7AM, please contact night-coverage [email or web address]  10/17/2021, 7:58 AM

## 2021-10-17 NOTE — Plan of Care (Signed)

## 2021-10-18 LAB — GLUCOSE, CAPILLARY: Glucose-Capillary: 100 mg/dL — ABNORMAL HIGH (ref 70–99)

## 2021-10-18 MED ORDER — LIDOCAINE VISCOUS HCL 2 % MT SOLN
15.0000 mL | Freq: Once | OROMUCOSAL | Status: DC
  Filled 2021-10-18: qty 15

## 2021-10-18 MED ORDER — PEG 3350-KCL-NA BICARB-NACL 420 G PO SOLR: 4000.0000 mL | Freq: Once | ORAL | Status: DC

## 2021-10-18 MED ORDER — ALUM & MAG HYDROXIDE-SIMETH 200-200-20 MG/5ML PO SUSP
30.0000 mL | Freq: Once | ORAL | Status: AC
  Administered 2021-10-18: 30 mL
  Filled 2021-10-18: qty 30

## 2021-10-18 NOTE — Progress Notes (Signed)
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  Patient declined to return to SNF and plan was to discharge home with home health.  2 nephews flew down from [place] to provide care, case was discussed with daughter at bedside 1/6, reviewed complex care needs and daughter felt patient can go home with robust support.  1/7 family felt caring for her at home would not be possible, after further discussion patient elected SNF.     Assessment and Plan  Goals of care -Continue to progress with comfort measures per discussion with daughter, patient is less responsive today -Continue to focus on comfort today, patient complaining of some nausea and constipation, as such we will increase bowel regimen -GI cocktail x1; analgesics and antiemetics ongoing with varying degrees of improvement in symptoms  Nausea, secondary to tube feeds --Uncontrolled, discontinue tube feeds given perimortem status as above.  Malignant pleural effusion, POA, resolving -- Secondary to lung cancer, recurrent in nature, therefore Pleurx catheter placed by pulmonary medicine.  Plan is to drain Pleurx as need for dyspnea. Output is low.  Continue Foley catheter care and PEG tube.   Adenocarcinoma of left lung, stage 4 ([place])- (present on admission) -- Dr. Earlie Server notified of admission -no current indication for evaluation or treatment at this time. Patient's family is requesting evaluation with oncology, there appears to be some confusion about patient's treatment course, husband mentions today her cancer is in  remission which we discussed was very much not the case.  Would appreciate their insight and recommendations for this difficult case given confusion surrounding patient's diagnosis and prognosis.   Left leg DVT ([place])- (present on admission) -- Continue therapeutic Lovenox given underlying cancer.     Anemia of chronic disease- (present on admission) -- Stable.   Chronic leukocytosis- (present on admission) -Unclear etiology, elevated through December now for nearly 4 weeks, likely reactive in the setting of malignancy.  Appreciate oncology insight and recommendations.  Plan for further outpatient work-up with oncology.    Protein-calorie malnutrition, severe- (present on admission) -Tube feeds discontinued, patient is not tolerating them even at low rates continues to have intractable nausea abdominal control medications, appears to be somewhat constipated as well. -Can resume tube feed trial once patient has bowel movement   A-fib ([place])- (present on admission) --Continue amiodarone, enoxaparin, metoprolol.   Type 2 diabetes mellitus (HCC) -- Stable here, resume metformin on discharge.   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  Continue on discharge.   Cancer related pain- (present on admission) -- Stable.  Continue fentanyl patch, oxycodone.  DVT prophylaxis: Lovenox Code Status: DNR Family Communication: At bedside  Status is: Inpatient  Dispo: The patient is from: Home              Anticipated d/c is to: SNF              Anticipated d/c date is: 24 to 48 hours              Patient currently is medically stable for discharge  Consultants:  Oncology  Subjective:  No acute issues or events overnight - continues to complain of worsening nausea with tube feeds - they will be discontinued for comfort.  Objective: Vitals:   10/17/21 0612 10/17/21 2018 10/18/21 0500 10/18/21 0527  BP: 116/68 135/73  136/75   Pulse: 91 98  (!) 101  Resp: 18 19  14   Temp: 98.2 F (36.8 C) 97.8 F (36.6 C)  98.4 F (36.9 C)  TempSrc: Oral Oral  Oral  SpO2: 100% 100%  100%  Weight:   50.8 kg   Height:        Intake/Output Summary (Last 24 hours) at 10/18/2021 0736 Last data filed at 10/18/2021 0500 Gross per 24 hour  Intake 150 ml  Output 600 ml  Net -450 ml    Filed Weights   10/16/21 0449 10/17/21 0500 10/18/21 0500  Weight: 53.8 kg 50.6 kg 50.8 kg    Examination:  General: Gaunt appearing female resting comfortably in bed, no acute distress  HEENT:  Normocephalic atraumatic.  Sclerae nonicteric, noninjected.  Extraocular movements intact bilaterally. Neck:  Without mass or deformity.  Trachea is midline. Lungs:  Clear to auscultate bilaterally without rhonchi, wheeze, or rales. Heart:  Regular rate and rhythm.  Without murmurs, rubs, or gallops. Abdomen:  Soft, nontender, nondistended.  Without guarding or rebound. Extremities: Without cyanosis, clubbing, edema, or obvious deformity. Vascular:  Dorsalis pedis and posterior tibial pulses palpable bilaterally. Skin:  Warm and dry, no erythema, no ulcerations.  Data Reviewed: I have personally reviewed following labs and imaging studies  CBC: Recent Labs  Lab 10/14/21 0441  WBC 31.0*  HGB 7.8*  HCT 26.3*  MCV 100.0  PLT 480*    Basic Metabolic Panel: Recent Labs  Lab 10/14/21 0441  NA 131*  K 4.0  CL 94*  CO2 26  GLUCOSE 141*  BUN 16  CREATININE 0.39*  CALCIUM 8.8*    GFR: Estimated Creatinine Clearance: 57 mL/min (A) (by C-G formula based on SCr of 0.39 mg/dL (L)). Liver Function Tests: No results for input(s): AST, ALT, ALKPHOS, BILITOT, PROT, ALBUMIN in the last 168 hours. No results for input(s): LIPASE, AMYLASE in the last 168 hours. No results for input(s): AMMONIA in the last 168 hours. Coagulation Profile: No results for input(s): INR, PROTIME in the last 168 hours. Cardiac Enzymes: No results for input(s):  CKTOTAL, CKMB, CKMBINDEX, TROPONINI in the last 168 hours. BNP (last 3 results) No results for input(s): PROBNP in the last 8760 hours. HbA1C: No results for input(s): HGBA1C in the last 72 hours. CBG: Recent Labs  Lab 10/17/21 0610 10/17/21 1139 10/17/21 1759 10/17/21 2305 10/18/21 0534  GLUCAP 152* 145* 121* 110* 100*    Lipid Profile: No results for input(s): CHOL, HDL, LDLCALC, TRIG, CHOLHDL, LDLDIRECT in the last 72 hours. Thyroid Function Tests: No results for input(s): TSH, T4TOTAL, FREET4, T3FREE, THYROIDAB in the last 72 hours. Anemia Panel: No results for input(s): VITAMINB12, FOLATE, FERRITIN, TIBC, IRON, RETICCTPCT in the last 72 hours. Sepsis Labs: No results for input(s): PROCALCITON, LATICACIDVEN in the last 168 hours.  No results found for this or any previous visit (from the past 240 hour(s)).       Radiology Studies: No results found.      Scheduled Meds:  amiodarone  200 mg Per Tube Daily   chlorhexidine  15 mL Mouth Rinse BID   Chlorhexidine Gluconate Cloth  6 each Topical Daily   fentaNYL  1 patch Transdermal Q72H   meclizine  25 mg Per Tube BID  mouth rinse  15 mL Mouth Rinse q12n4p   polyethylene glycol  17 g Per Tube Daily   traZODone  100 mg Oral QHS   Continuous Infusions:     LOS: 18 days   Time spent: 74min  Sharla Tankard C Adrinne Sze, DO [facility]  If 7PM-7AM, please contact night-coverage [email or web address]  10/18/2021, 7:36 AM

## 2021-10-18 NOTE — Progress Notes (Cosign Needed)
Patient continues comfort care. Husband is in denial about disease and disease process and wants to continue aggressive treatments. Dr spoke with husband this AM in regards to pt diagnosis and prognosis. Daughter wants mom to be comfortable and is requested staff to not give the GoLightly treatment. Palliative care has been notified by primary RN.  Pt is more lethargic this afternoon than this AM.

## 2021-10-18 NOTE — Progress Notes (Signed)
Daughter is present at bedside. Concerned about current order for laxative putting stress/discomfort on her mother. States "as long as she can make her own choices," that's what she wants. She "does not want her mother in pain". Asked patient if she wanted PRN suppository or ordered Golytely to achieve BM. Patient refused both. Voices no c/o nausea/vomiting or pain at the moment. Daughter requesting to speak with Md.Eulas Post, RN

## 2021-10-19 DIAGNOSIS — R110 Nausea: Secondary | ICD-10-CM

## 2021-10-19 DIAGNOSIS — Z9689 Presence of other specified functional implants: Secondary | ICD-10-CM

## 2021-10-19 LAB — GLUCOSE, CAPILLARY: Glucose-Capillary: 75 mg/dL (ref 70–99)

## 2021-10-19 MED ORDER — MELATONIN 3 MG PO TABS
3.0000 mg | ORAL_TABLET | Freq: Once | ORAL | Status: AC
  Administered 2021-10-20: 3 mg via ORAL
  Filled 2021-10-19: qty 1

## 2021-10-19 NOTE — Progress Notes (Signed)
Physical Therapy Discharge Patient Details Name: Kim Ross MRN: [id number] DOB: 02/27/1956 Today's Date: 10/19/2021 Time:  -     Patient discharged from PT services secondary to medical decline - will need to re-order PT to resume therapy services.  Please see latest therapy progress note for current level of functioning and progress toward goals.    Progress and discharge plan discussed with patient and/or caregiver:    "Patient reevaluated this evening with daughter at bedside, lengthy discussion about goals of care.  Patient and daughter agreeable to transition forward with comfort measures given her inability to cooperate or participate in physical therapy, previous plan for discharge to SNF seems fruitless.  We will discuss with palliative care over the weekend for possible disposition home with hospice versus hospice house, however given patient's progressing disease in hospital demise is not unexpected." - Per recent MD note       [place] 10/19/2021, 12:48 PM  Jannette Spanner PT, DPT Acute Rehabilitation Services Pager: [phone] Office: [phone]

## 2021-10-19 NOTE — Progress Notes (Signed)
Manufacturing engineer [facility]) Hospital Liaison note.    Received request from [place], [place] for family interest in [facility]. Left VM for daughter, [name] to return call.  Patient information has been forwarded to [facility] for review.    Please do not hesitate to call with questions.    Thank you,   Farrel Gordon, RN, [place] Hospital Liaison   [phone]

## 2021-10-19 NOTE — TOC Progression Note (Signed)
Transition of Care [facility]) - Progression Note    Patient Details  Name: Kim Ross MRN: [id number] Date of Birth: Oct 22, 1955  Transition of Care [facility]) CM/SW Contact  Ross Ludwig, [place] Phone Number: 10/19/2021, 2:16 PM  Clinical Narrative:     CSW received consult that patient and family are now agreeable to residential hospice and would like [facility] if patient qualifies.  Referral made to [facility] to work on the referral and review the patient.  CSW to continue to follow patient's progress throughout discharge planning.   Expected Discharge Plan: [place] Barriers to Discharge: Continued Medical Work up  Expected Discharge Plan and Services Expected Discharge Plan: [place]   Discharge Planning Services: CM Consult     Expected Discharge Date: 10/10/21               DME Arranged: Hospital bed DME Agency: [employer] Date DME Agency Contacted: 10/07/21 Time DME Agency Contacted: [phone] Representative spoke with at DME Agency: Brenton Grills [place]: RN, PT (PEG-TF) [place] Agency: [employer] Date [place]: 10/02/21 Time HH Agency Contacted: 24 Representative spoke with at [place]: Cory/Pam chandler   Social Determinants of Health ([place]) Interventions    Readmission Risk Interventions Readmission Risk Prevention Plan 09/30/2021 09/18/2021  Transportation Screening Complete Complete  Medication Review Press photographer) - Complete  PCP or Specialist appointment within 3-5 days of discharge Complete Complete  HRI or [place] - Complete  SW Recovery Care/Counseling Consult Complete -  Palliative Care Screening Complete Complete  Skilled Nursing Facility Complete Complete  Some recent data might be hidden

## 2021-10-19 NOTE — Progress Notes (Signed)
Daily Progress Note   Patient Name: Kim Ross       Date: 10/19/2021 DOB: 1955/11/26  Age: 66 y.o. MRN#: [id number] Attending Physician: Little Ishikawa, MD Primary Care Physician: Erskine Emery, MD Admit Date: 09/29/2021 Length of Stay: 19 days  Reason for Consultation/Follow-up: Establishing goals of care  HPI/Patient Profile:  Palliative Care consult requested for goals of care discussion in this 66 y.o. 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  Patient was admitted for more than 30 days in November due to atrial fibrillation with RVR after receiving chemotherapy.  At that time she was also diagnosed with left lower extremity DVT.  She also had PEG tube placed due to severe malnutrition.  She was discharged to skilled nursing facility at that time.  Ms. Mizrachi was admitted on 09/29/2021 from SNF with shortness of breath via EMS.  She was placed on nonrebreather and received Solu-Medrol in route.  Pleurx catheter has been placed due to recurrent malignant pleural effusions.  Plan was for patient to discharge back to skilled facility, however the patient has had a medical decline and PT has signed off due to not able to participate in therapy.  Subjective:   Subjective: Chart Reviewed. Updates received. Patient Assessed. Created space and opportunity for patient  and family to explore thoughts and feelings regarding current medical situation.  Today's Discussion: Her husband was in the side chair sleeping and I elected not to awaken him.  I spoke with the patient who appears weak, but appears to have capacity and is able to express her wishes. I explained her clinical situation and that she is very sick and appears to be at a point where we cannot cure her. She says she understands. I explained that we are now focusing on comfort, dignity, quality rather  than cure and she again tells me she understands. I explained that in that philospphy she would be eligible for hospice and I asked her if she would be ok with getting hospice involved she said she is. I reiterated that this means she is nearing the end of her life. I explained hospice can be delivered in the home or at a residential facility and I feel she would qualify for either and asked if she has a preference. She tells me she does not and told me I could speak with her daughter to make these decisions for her. I repeated "so you want your daughter to make your decisins if you are unable to?" and she affirmed that yes she does.  I spoke with her daughter [name] on the phone. She expressed that she wants her mom to make her decisions (as far as agreeing to hospice or not) as long as she can. But she agrees hospice is the best option for her (agrees with her mom's decision). She has elected evaluation for residential hospice at [facility]) and agrees to consult being placed.  The patient's daughter indicates that she does have some support.  I offered that chaplain at the hospital is available to meet with her if needed for ongoing support.  Her daughter has previously worked with hospice as a Clinical biochemist.  I provided emotional general support through therapeutic listening, empathy, sharing of stories, and other techniques.  Answered her questions and addressed all concerns to the best my ability.  Review of Systems  Constitutional:  Positive for  fatigue.  Gastrointestinal:  Positive for abdominal pain (8/10).  Neurological:  Positive for weakness.   Objective:   Vital Signs:  BP 117/64 (BP Location: Left Arm)    Pulse 96    Temp (!) 97.3 F (36.3 C)    Resp 20    Ht 5\' 5"  (1.651 m)    Wt 50.1 kg    SpO2 100%    BMI 18.38 kg/m   Physical Exam: Physical Exam Vitals and nursing note reviewed.  Constitutional:      General: She is not in acute distress.     Appearance: She is ill-appearing.  HENT:     Head: Normocephalic and atraumatic.  Cardiovascular:     Rate and Rhythm: Normal rate.  Pulmonary:     Effort: No respiratory distress.  Abdominal:     General: Abdomen is flat.     Palpations: Abdomen is soft.  Skin:    General: Skin is warm and dry.  Neurological:     General: No focal deficit present.     Mental Status: She is easily aroused. She is lethargic.  Psychiatric:        Mood and Affect: Mood normal.        Behavior: Behavior normal.    Palliative Assessment/Data: 10%   Assessment & Plan:   Impression: Present on Admission:  Adenocarcinoma of left lung, stage 4 (HCC)  Generalized anxiety disorder  A-fib (HCC)  Protein-calorie malnutrition, severe  Leukocytosis  Hyperlipidemia  Anemia of chronic disease  Pressure injury of skin  Left leg DVT (HCC)  Cancer related pain  SUMMARY OF RECOMMENDATIONS   Remain DNR TOC referral placed for residential hospice evaluation with [facility] Continue comfort care Continued emotional and spiritual support of patient and family Continued symptom management as per below RN notified of patient complaint of pain so that morphine can be given PMT will continue to assist  Symptom Management:  Zofran 4 mg every 6 hours as needed nausea  oxycodone 5 mg every 4 hours as needed moderate pain morphine 2 mg IV every hour as needed pain Fentanyl Duragesic patch 50 mcg/h change every 72 hours  Code Status: DNR  Prognosis: < 2 weeks  Discharge Planning: Hospice facility  Discussed with: Medical team, nursing team, patient patient's daughter  Thank you for allowing [place] to participate in the care of [name] PMT will continue to support holistically.  MDM high: 1 acute or chronic illness that poses a threat to life or bodily function, discussion of management with physician/other qualified health personnel (used nursing team, medical  team), continued management of parenteral controlled substances  Walden Field, NP Palliative Medicine Team  Team Phone # [phone] (Nights/Weekends)  06/02/2021, 8:17 AM

## 2021-10-19 NOTE — Progress Notes (Signed)
Nutrition Brief Note  Patient last seen for follow-up assessment by this RD on 1/12. Chart reviewed. Patient  now transitioned to comfort care. Tube feeding has been discontinued. TOC note from a few minutes ago indicates patient and family interested in [facility].  No further nutrition interventions planned at this time. Please re-consult as needed.      Jarome Matin, MS, RD, LDN Inpatient Clinical Dietitian RD pager # available in [place]  After hours/weekend pager # available in [facility]

## 2021-10-19 NOTE — Progress Notes (Signed)
PROGRESS NOTE    Kim Ross  XBD:[id number] DOB: [birth year] DOA: 09/29/2021 PCP: Erskine Emery, MD   Brief Narrative:  66 year old woman known stage IV NSCLC with known left pleural involvement, metastatic pleural effusion, presented 12/27 with more shortness of breath and found to have large left recurrent malignant pleural effusion with tension component, seen by pulmonary medicine in the emergency department and improved after placement of Pleurx catheter.  Patient declined to return to SNF and plan was to discharge home with home health.  2 nephews flew down from [place] to provide care, case was discussed with daughter at bedside 1/6, reviewed complex care needs and daughter felt patient can go home with robust support.  1/7 family felt caring for her at home would not be possible, after further discussion patient elected SNF.     Assessment and Plan  Goals of care -Continue to progress with comfort measures per discussion with daughter, patient is less responsive today -Continue to focus on comfort today, patient complaining of some nausea and constipation,plan to increase bowel regimen as tolerated -Palliative care working on transition to [place] per patient request.  Nausea, secondary to tube feeds --Uncontrolled, continue to hold tube feeds given perimortem status as above.  Malignant pleural effusion, POA, resolving -- Secondary to lung cancer, recurrent in nature, therefore Pleurx catheter placed by pulmonary medicine.  Plan is to drain Pleurx as need for dyspnea. Output is low.  Continue Foley catheter care and PEG tube.   Adenocarcinoma of left lung, stage 4 ([place])- (present on admission) -- Dr. Earlie Server notified of admission -no current indication for evaluation or treatment at this time. Patient's family is requesting evaluation with oncology, there appears to be some confusion about patient's treatment course, husband mentions today her cancer is in remission which  we discussed was very much not the case.  Would appreciate their insight and recommendations for this difficult case given confusion surrounding patient's diagnosis and prognosis.   Left leg DVT ([place])- (present on admission) -- Continue therapeutic Lovenox given underlying cancer.     Anemia of chronic disease- (present on admission) -- Stable.   Chronic leukocytosis- (present on admission) -Unclear etiology, elevated through December now for nearly 4 weeks, likely reactive in the setting of malignancy.  Appreciate oncology insight and recommendations.  Plan for further outpatient work-up with oncology.    Protein-calorie malnutrition, severe- (present on admission) -Tube feeds discontinued, patient is not tolerating them even at low rates continues to have intractable nausea abdominal control medications, appears to be somewhat constipated as well. -Can resume tube feed trial once patient has bowel movement   A-fib ([place])- (present on admission) --Continue amiodarone, enoxaparin, metoprolol.   Type 2 diabetes mellitus (HCC) -- Stable here, resume metformin on discharge.   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  Continue on discharge.   Cancer related pain- (present on admission) -- Stable.  Continue fentanyl patch, oxycodone.  DVT prophylaxis: Lovenox Code Status: DNR Family Communication: At bedside  Status is: Inpatient  Dispo: The patient is from: Home              Anticipated d/c is to: Hospice house              Anticipated d/c date is: 24 to 48 hours              Patient currently is medically stable for discharge  Consultants:  Oncology  Subjective: No  acute issues or events overnight - continues to complain of worsening abdominal pain/nausea but refusing medications in the setting of anxiety of having to deal with increased BM frequency/aftermath  Objective: Vitals:   10/18/21 1300 10/18/21  2058 10/19/21 0500 10/19/21 0529  BP: 130/77 108/68  117/64  Pulse: (!) 102 (!) 101  96  Resp: 14 20  20   Temp: 98.7 F (37.1 C) 97.8 F (36.6 C)  (!) 97.3 F (36.3 C)  TempSrc: Axillary Oral    SpO2: 100% 100%  100%  Weight:   50.1 kg   Height:        Intake/Output Summary (Last 24 hours) at 10/19/2021 0741 Last data filed at 10/19/2021 0543 Gross per 24 hour  Intake --  Output 450 ml  Net -450 ml    Filed Weights   10/17/21 0500 10/18/21 0500 10/19/21 0500  Weight: 50.6 kg 50.8 kg 50.1 kg    Examination:  General: Gaunt appearing female resting in bed, no acute distress  HEENT:  Normocephalic atraumatic.  Sclerae nonicteric, noninjected.  Extraocular movements intact bilaterally. Neck:  Without mass or deformity.  Trachea is midline. Lungs:  Clear to auscultate bilaterally without rhonchi, wheeze, or rales. Heart:  Regular rate and rhythm.  Without murmurs, rubs, or gallops. Abdomen:  Soft, nontender, nondistended.  Without guarding or rebound. Extremities: Without cyanosis, clubbing, edema, or obvious deformity. Vascular:  Dorsalis pedis and posterior tibial pulses palpable bilaterally. Skin:  Warm and dry, no erythema, no ulcerations.  Data Reviewed: I have personally reviewed following labs and imaging studies  CBC: Recent Labs  Lab 10/14/21 0441  WBC 31.0*  HGB 7.8*  HCT 26.3*  MCV 100.0  PLT 480*    Basic Metabolic Panel: Recent Labs  Lab 10/14/21 0441  NA 131*  K 4.0  CL 94*  CO2 26  GLUCOSE 141*  BUN 16  CREATININE 0.39*  CALCIUM 8.8*    GFR: Estimated Creatinine Clearance: 56.2 mL/min (A) (by C-G formula based on SCr of 0.39 mg/dL (L)). Liver Function Tests: No results for input(s): AST, ALT, ALKPHOS, BILITOT, PROT, ALBUMIN in the last 168 hours. No results for input(s): LIPASE, AMYLASE in the last 168 hours. No results for input(s): AMMONIA in the last 168 hours. Coagulation Profile: No results for input(s): INR, PROTIME in the last  168 hours. Cardiac Enzymes: No results for input(s): CKTOTAL, CKMB, CKMBINDEX, TROPONINI in the last 168 hours. BNP (last 3 results) No results for input(s): PROBNP in the last 8760 hours. HbA1C: No results for input(s): HGBA1C in the last 72 hours. CBG: Recent Labs  Lab 10/17/21 0610 10/17/21 1139 10/17/21 1759 10/17/21 2305 10/18/21 0534  GLUCAP 152* 145* 121* 110* 100*    Lipid Profile: No results for input(s): CHOL, HDL, LDLCALC, TRIG, CHOLHDL, LDLDIRECT in the last 72 hours. Thyroid Function Tests: No results for input(s): TSH, T4TOTAL, FREET4, T3FREE, THYROIDAB in the last 72 hours. Anemia Panel: No results for input(s): VITAMINB12, FOLATE, FERRITIN, TIBC, IRON, RETICCTPCT in the last 72 hours. Sepsis Labs: No results for input(s): PROCALCITON, LATICACIDVEN in the last 168 hours.  No results found for this or any previous visit (from the past 240 hour(s)).       Radiology Studies: No results found.      Scheduled Meds:  amiodarone  200 mg Per Tube Daily   chlorhexidine  15 mL Mouth Rinse BID   Chlorhexidine Gluconate Cloth  6 each Topical Daily   fentaNYL  1 patch Transdermal Q72H   lidocaine  15 mL Mouth/Throat Once   meclizine  25 mg Per Tube BID   mouth rinse  15 mL Mouth Rinse q12n4p   polyethylene glycol  17 g Per Tube Daily   polyethylene glycol-electrolytes  4,000 mL Oral Once   traZODone  100 mg Oral QHS   Continuous Infusions:     LOS: 19 days   Time spent: 76min  Rodger Giangregorio C Airrion [place], DO [facility]  If 7PM-7AM, please contact night-coverage [email or web address]  10/19/2021, 7:41 AM

## 2021-10-20 LAB — GLUCOSE, CAPILLARY: Glucose-Capillary: 105 mg/dL — ABNORMAL HIGH (ref 70–99)

## 2021-10-20 NOTE — Progress Notes (Signed)
Report called to Myra at [facility], she has accepted report on this patient and endorses no additional questions at this time.

## 2021-10-20 NOTE — Progress Notes (Signed)
[facility] ([facility]) Hospital Liaison note.     This patient is approved to transfer to [facility] today.    [facility] will notify TOC when registration paperwork has been completed to arrange transport.    RN please call report to [phone].   Thank you,     Mary Anne Robertson, RN, CCM       [facility] Hospital Liaison  [phone] 

## 2021-10-20 NOTE — Progress Notes (Signed)
At discharge, the patient departed with [facility] to transport the patient to [facility]. The patient was discharged with discharge instructions via packet for facility and husband has all belongings. All hospital equipment was removed from the patient. The patient nor spouse endorse additional questions at this time.

## 2021-10-20 NOTE — TOC Transition Note (Addendum)
Transition of Care [facility]) - CM/SW Discharge Note   Patient Details  Name: Kim Ross MRN: [id number] Date of Birth: [birth year]  [facility] CM/SW Contact:  Dessa Phi, RN Phone Number: 10/20/2021, 10:11 AM   Clinical Narrative:  d/c today [facility]-await per Elvis Coil care rep [facility] for when to arrange [facility]. No further CM needs.  -[facility] called. No further CM needs.    Final next level of care: [place] Barriers to Discharge: No Barriers Identified   Patient Goals and CMS Choice        Discharge Placement                       Discharge Plan and Services   Discharge Planning Services: CM Consult            DME Arranged: Hospital bed DME Agency: [employer] Date DME Agency Contacted: 10/07/21 Time DME Agency Contacted: [phone] Representative spoke with at DME Agency: Brenton Grills [place]: RN, PT (PEG-TF) [place] Agency: [employer], [place] Date [place]: 10/02/21 Time HH Agency Contacted: 59 Representative spoke with at [place]: Cory/Pam chandler  Social Determinants of Health ([place]) Interventions     Readmission Risk Interventions Readmission Risk Prevention Plan 09/30/2021 09/18/2021  Transportation Screening Complete Complete  Medication Review [employer]) - Complete  PCP or Specialist appointment within 3-5 days of discharge Complete Complete  HRI or [place] - Complete  SW Recovery Care/Counseling Consult Complete -  Palliative Care Screening Complete Complete  Skilled Nursing Facility Complete Complete  Some recent data might be hidden

## 2021-10-20 NOTE — Progress Notes (Signed)
The patients final vital signs before transportation are as followed: Temp: 97.47f (Oral) BP: 108/77 Pulse: 110 O2:100% on room air  BG-105
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
## 2021-10-21 ENCOUNTER — Inpatient Hospital Stay: Payer: 59

## 2021-10-21 ENCOUNTER — Other Ambulatory Visit: Payer: 59

## 2021-10-22 ENCOUNTER — Telehealth: Payer: Self-pay

## 2021-10-23 NOTE — Telephone Encounter (Signed)
2nd attempt to reach patient. To make follow up appt with Dr. Zigmund Daniel. No answer. Unable to LVM due to mailbox being full. Salvatore Marvel, CMA

## 2021-10-23 NOTE — Telephone Encounter (Signed)
-----   Message from Erskine Emery, MD sent at 10/21/2021 12:37 AM EST ----- Hello,   I need to scheduled hospital follow up with this patient in the coming weeks.   Thank you, [employer] Data processing manager

## 2021-10-27 ENCOUNTER — Ambulatory Visit: Payer: 59

## 2021-10-27 ENCOUNTER — Other Ambulatory Visit: Payer: 59

## 2021-10-27 ENCOUNTER — Ambulatory Visit: Payer: 59 | Admitting: Internal Medicine

## 2021-10-28 ENCOUNTER — Inpatient Hospital Stay: Payer: 59

## 2021-10-28 ENCOUNTER — Inpatient Hospital Stay: Payer: 59 | Admitting: Physician Assistant

## 2021-11-02 ENCOUNTER — Encounter: Payer: Self-pay | Admitting: Radiation Therapy

## 2021-11-04 ENCOUNTER — Inpatient Hospital Stay: Payer: 59

## 2021-11-04 ENCOUNTER — Inpatient Hospital Stay: Payer: 59 | Admitting: Internal Medicine

## 2021-11-04 ENCOUNTER — Inpatient Hospital Stay: Payer: 59 | Admitting: Dietician

## 2021-11-04 NOTE — Telephone Encounter (Signed)
-----   Message from Erskine Emery, MD sent at 10/21/2021 12:37 AM EST ----- Hello,   I need to scheduled hospital follow up with this patient in the coming weeks.   Thank you, [employer] Data processing manager

## 2021-11-04 NOTE — Telephone Encounter (Signed)
Attempted to reach patient to make hospital follow up. No answer. Unable to LVM due to mailbox being full. Salvatore Marvel, CMA

## 2021-11-04 DEATH — deceased

## 2021-11-06 ENCOUNTER — Ambulatory Visit: Payer: Self-pay | Admitting: Radiation Oncology

## 2021-11-17 ENCOUNTER — Ambulatory Visit: Payer: 59

## 2021-11-17 ENCOUNTER — Other Ambulatory Visit: Payer: 59

## 2021-11-17 ENCOUNTER — Ambulatory Visit: Payer: 59 | Admitting: Internal Medicine

## 2021-11-29 IMAGING — CR DG CHEST 2V
2 series · 2 of 2 positions shown · non-contrast
Comparison: October 28, 2019

CLINICAL DATA: chest pain

EXAM:
CHEST - 2 VIEW

[w chest pa]
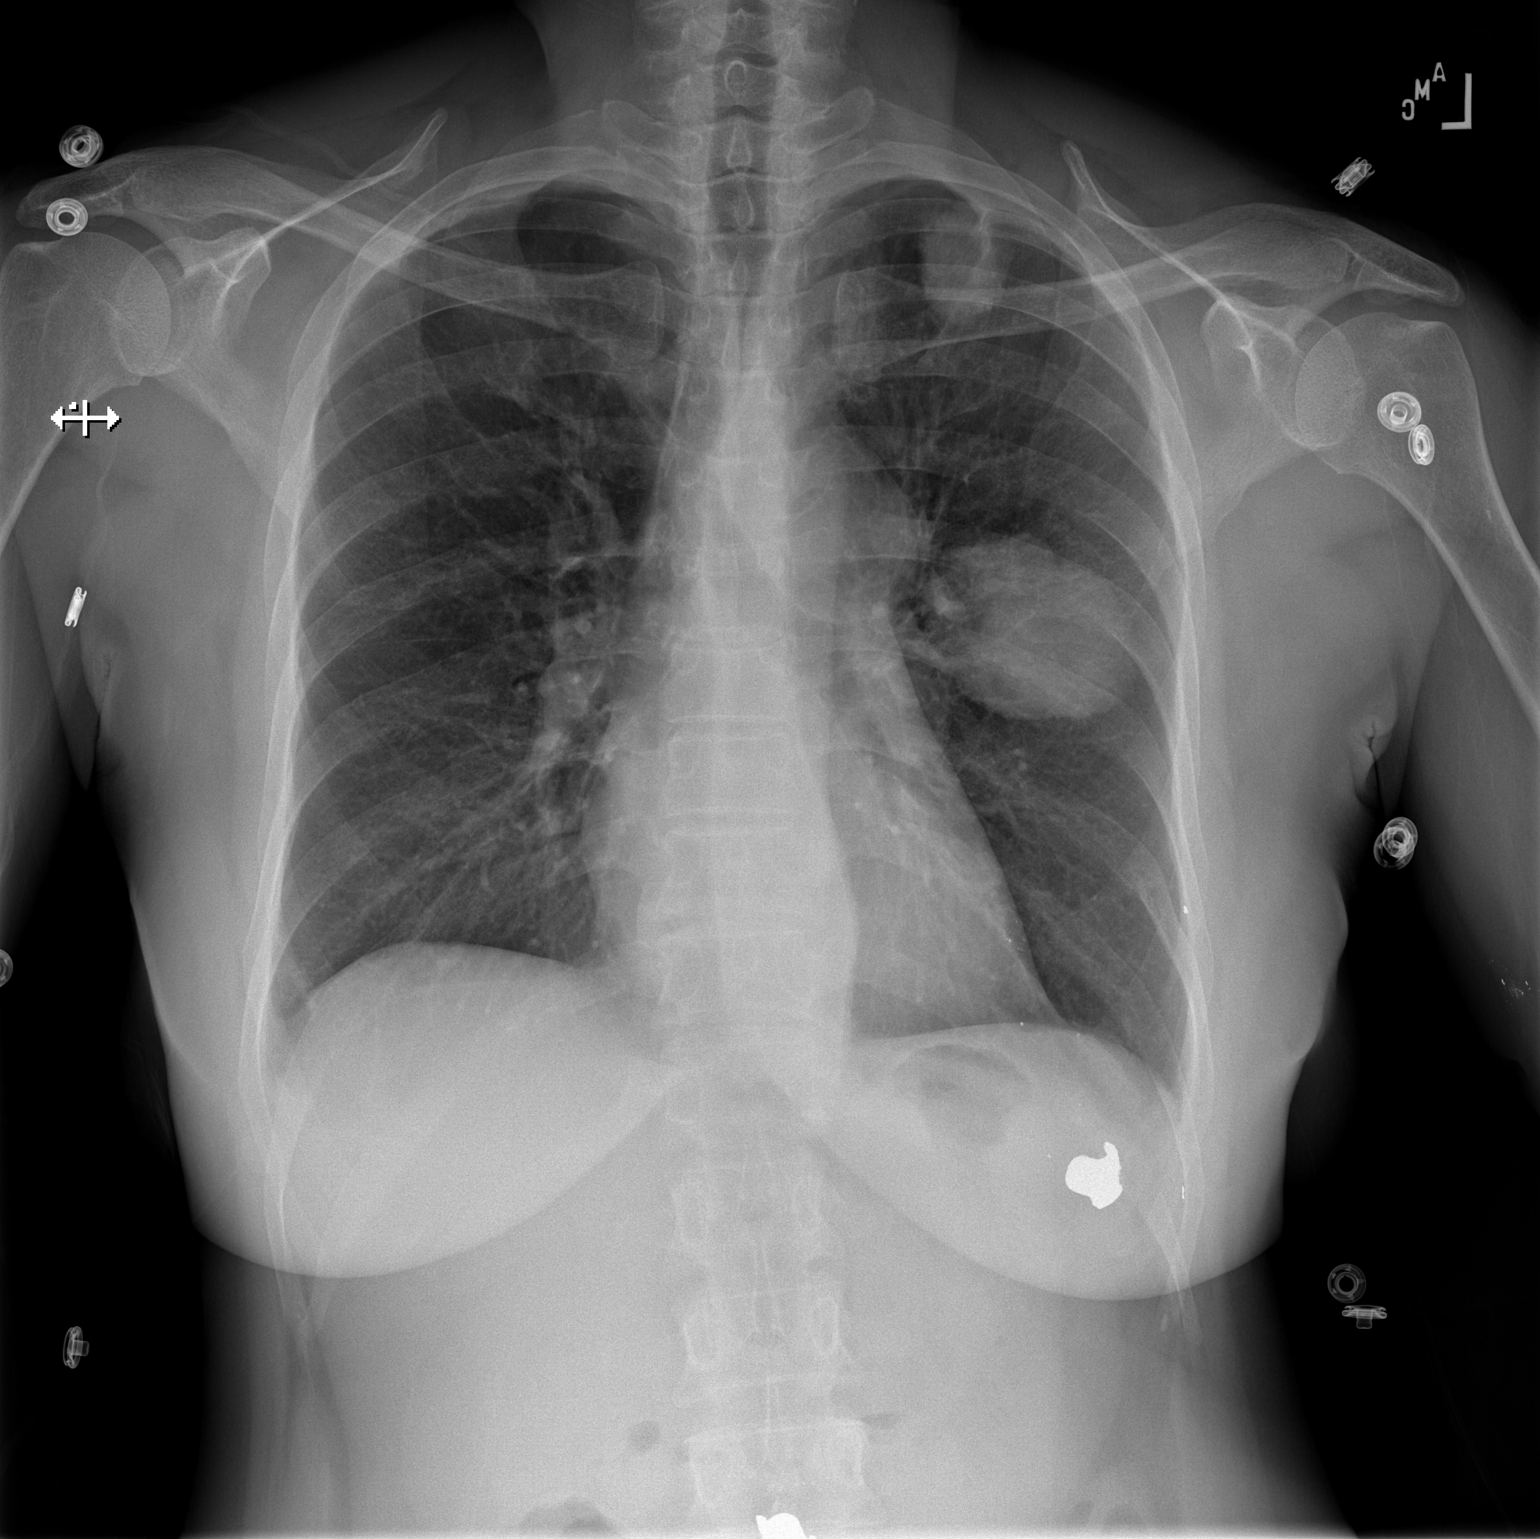

[w chest lat]
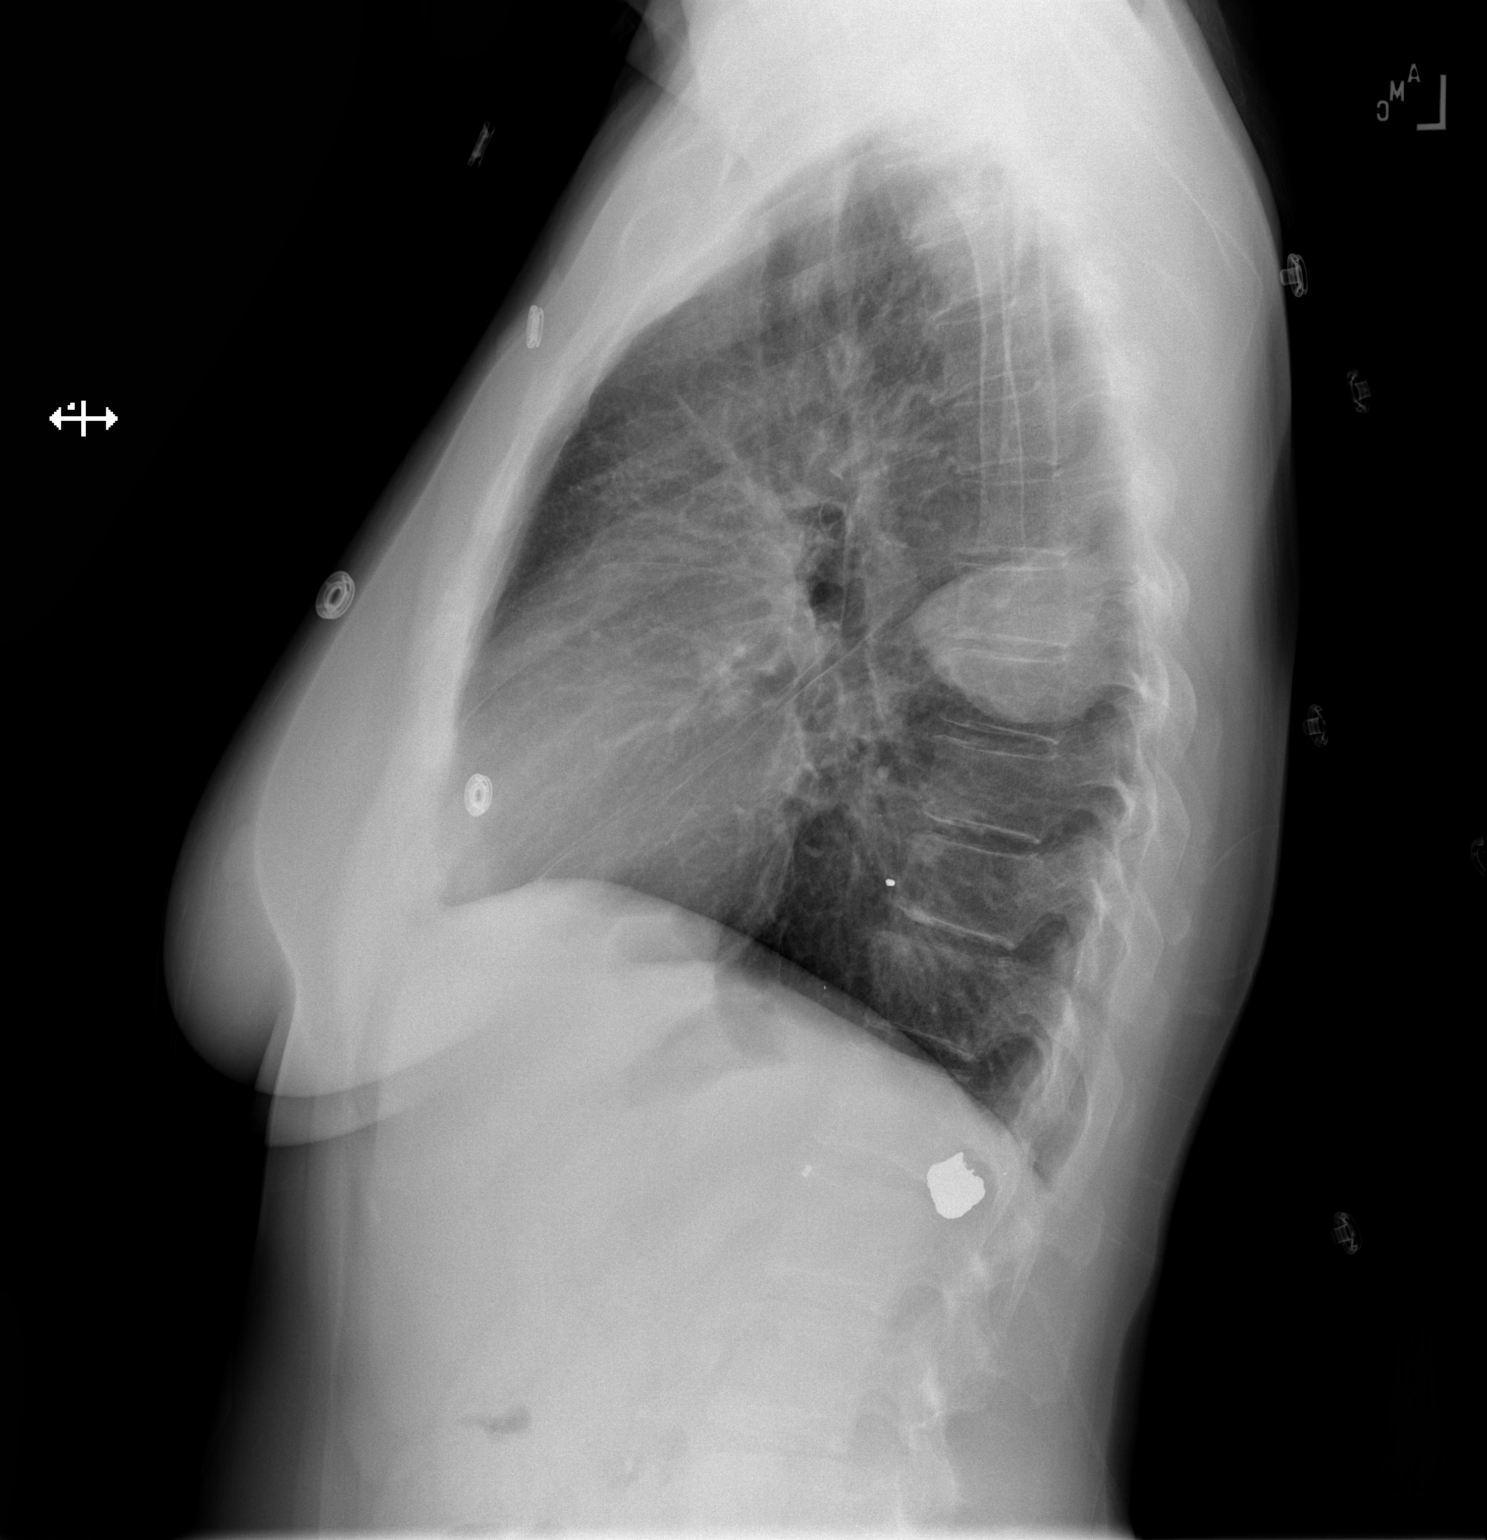

[2 of 2 positions shown; findings below may reference images not displayed]

FINDINGS: There is increased rounded prominence of the LEFT hilar border.
Unchanged cardiac size.There are 2 new nodular opacities in the LEFT
lung. There is a nodular opacity at the LEFT apex which measures
x 2.3 cm. There is an additional nodular opacity in LEFT lower lobe
which measures 4.7 x 5.4 cm. No pleural effusion. No pneumothorax.
Shrapnel overlies the LEFT abdomen. Vascular calcifications.
IMPRESSION: There are 2 new LEFT-sided pulmonary masses with increased
nodularity along the LEFT hilar border. Recommend further evaluation
with dedicated chest CT.

These results were called by telephone at the time of interpretation
on 05/01/2021 at [DATE] to provider MIRKA ANNA BURCHARDT , who verbally
acknowledged these results.

## 2021-12-08 ENCOUNTER — Other Ambulatory Visit: Payer: 59

## 2021-12-08 ENCOUNTER — Ambulatory Visit: Payer: 59 | Admitting: Internal Medicine

## 2021-12-08 ENCOUNTER — Ambulatory Visit: Payer: 59
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
## 2022-09-14 NOTE — Telephone Encounter (Signed)
Opened in error
